# Patient Record
Sex: Female | Born: 1960
Health system: Southern US, Community
[De-identification: ages and names within clinical notes are randomized; demographics above are authoritative.]

## PROBLEM LIST (undated history)

## (undated) DIAGNOSIS — A6000 Herpesviral infection of urogenital system, unspecified: Secondary | ICD-10-CM

## (undated) DIAGNOSIS — F419 Anxiety disorder, unspecified: Secondary | ICD-10-CM

## (undated) DIAGNOSIS — K760 Fatty (change of) liver, not elsewhere classified: Secondary | ICD-10-CM

## (undated) DIAGNOSIS — Z789 Other specified health status: Secondary | ICD-10-CM

## (undated) DIAGNOSIS — E7800 Pure hypercholesterolemia, unspecified: Secondary | ICD-10-CM

## (undated) DIAGNOSIS — I10 Essential (primary) hypertension: Secondary | ICD-10-CM

## (undated) DIAGNOSIS — M609 Myositis, unspecified: Secondary | ICD-10-CM

## (undated) DIAGNOSIS — K859 Acute pancreatitis without necrosis or infection, unspecified: Secondary | ICD-10-CM

## (undated) DIAGNOSIS — F32A Depression, unspecified: Secondary | ICD-10-CM

## (undated) DIAGNOSIS — N939 Abnormal uterine and vaginal bleeding, unspecified: Secondary | ICD-10-CM

## (undated) DIAGNOSIS — D219 Benign neoplasm of connective and other soft tissue, unspecified: Secondary | ICD-10-CM

## (undated) DIAGNOSIS — I82409 Acute embolism and thrombosis of unspecified deep veins of unspecified lower extremity: Secondary | ICD-10-CM

## (undated) DIAGNOSIS — R7303 Prediabetes: Secondary | ICD-10-CM

## (undated) DIAGNOSIS — E1165 Type 2 diabetes mellitus with hyperglycemia: Secondary | ICD-10-CM

## (undated) DIAGNOSIS — B20 Human immunodeficiency virus [HIV] disease: Secondary | ICD-10-CM

## (undated) HISTORY — DX: Myositis, unspecified: M60.9

## (undated) HISTORY — DX: Pure hypercholesterolemia, unspecified: E78.00

## (undated) HISTORY — DX: Depression, unspecified: F32.A

## (undated) HISTORY — DX: Abnormal uterine and vaginal bleeding, unspecified: N93.9

## (undated) HISTORY — DX: Fatty (change of) liver, not elsewhere classified: K76.0

## (undated) HISTORY — DX: Anxiety disorder, unspecified: F41.9

## (undated) HISTORY — DX: Type 2 diabetes mellitus with hyperglycemia: E11.65

## (undated) HISTORY — DX: Benign neoplasm of connective and other soft tissue, unspecified: D21.9

## (undated) HISTORY — DX: Essential (primary) hypertension: I10

## (undated) HISTORY — DX: Acute pancreatitis without necrosis or infection, unspecified: K85.90

## (undated) HISTORY — DX: Acute embolism and thrombosis of unspecified deep veins of unspecified lower extremity: I82.409

## (undated) HISTORY — PX: IVC FILTER PLACEMENT (ARMC HX): HXRAD1551

## (undated) HISTORY — DX: Herpesviral infection of urogenital system, unspecified: A60.00

## (undated) HISTORY — PX: TONSILLECTOMY AND ADENOIDECTOMY: SUR1326

## (undated) HISTORY — DX: Human immunodeficiency virus (HIV) disease: B20

---

## 1992-11-17 DIAGNOSIS — B20 Human immunodeficiency virus [HIV] disease: Secondary | ICD-10-CM

## 1992-11-17 DIAGNOSIS — I10 Essential (primary) hypertension: Secondary | ICD-10-CM

## 1992-11-17 DIAGNOSIS — Z21 Asymptomatic human immunodeficiency virus [HIV] infection status: Secondary | ICD-10-CM

## 1992-11-17 HISTORY — DX: Essential (primary) hypertension: I10

## 1992-11-17 HISTORY — DX: Human immunodeficiency virus (HIV) disease: B20

## 1992-11-17 HISTORY — DX: Asymptomatic human immunodeficiency virus (hiv) infection status: Z21

## 2008-11-17 DIAGNOSIS — D219 Benign neoplasm of connective and other soft tissue, unspecified: Secondary | ICD-10-CM

## 2008-11-17 HISTORY — DX: Benign neoplasm of connective and other soft tissue, unspecified: D21.9

## 2012-10-18 ENCOUNTER — Other Ambulatory Visit: Payer: Self-pay

## 2012-10-18 DIAGNOSIS — E1169 Type 2 diabetes mellitus with other specified complication: Secondary | ICD-10-CM | POA: Insufficient documentation

## 2012-10-18 DIAGNOSIS — E785 Hyperlipidemia, unspecified: Secondary | ICD-10-CM | POA: Insufficient documentation

## 2012-10-18 DIAGNOSIS — R7303 Prediabetes: Secondary | ICD-10-CM | POA: Insufficient documentation

## 2012-10-18 DIAGNOSIS — I10 Essential (primary) hypertension: Secondary | ICD-10-CM | POA: Insufficient documentation

## 2012-10-18 DIAGNOSIS — B20 Human immunodeficiency virus [HIV] disease: Secondary | ICD-10-CM

## 2012-10-18 MED ORDER — EMTRICITAB-RILPIVIR-TENOFOV DF 200-25-300 MG PO TABS: 1.0000 | ORAL_TABLET | Freq: Every day | ORAL | Status: DC

## 2012-10-18 MED ORDER — RALTEGRAVIR POTASSIUM 400 MG PO TABS: 400.0000 mg | ORAL_TABLET | Freq: Two times a day (BID) | ORAL | Status: DC

## 2012-10-18 NOTE — Progress Notes (Signed)
Pt is in the process of transferring care from [facility].   She has been without her HIV medication for one month.  She was waiting for her primary care physician to make a referral and we have not received any notes.   Pt came on October 13, 2012 with copies of her notes and intake appointment was made.   She is not employed and needs assistance with medication.  We can obtain patient assistance for her Complera and Isentress for a 30 day supply.    Once information is obtained I will send medications to the pharmacy.     Laurell Josephs, RN

## 2012-10-21 ENCOUNTER — Ambulatory Visit (INDEPENDENT_AMBULATORY_CARE_PROVIDER_SITE_OTHER): Payer: Self-pay

## 2012-10-21 ENCOUNTER — Ambulatory Visit: Payer: Self-pay

## 2012-10-21 ENCOUNTER — Other Ambulatory Visit: Payer: Self-pay

## 2012-10-21 ENCOUNTER — Other Ambulatory Visit: Payer: Self-pay | Admitting: Internal Medicine

## 2012-10-21 DIAGNOSIS — L83 Acanthosis nigricans: Secondary | ICD-10-CM

## 2012-10-21 DIAGNOSIS — A630 Anogenital (venereal) warts: Secondary | ICD-10-CM

## 2012-10-21 DIAGNOSIS — I10 Essential (primary) hypertension: Secondary | ICD-10-CM

## 2012-10-21 DIAGNOSIS — Z111 Encounter for screening for respiratory tuberculosis: Secondary | ICD-10-CM

## 2012-10-21 DIAGNOSIS — Z79899 Other long term (current) drug therapy: Secondary | ICD-10-CM

## 2012-10-21 DIAGNOSIS — B20 Human immunodeficiency virus [HIV] disease: Secondary | ICD-10-CM

## 2012-10-21 DIAGNOSIS — D259 Leiomyoma of uterus, unspecified: Secondary | ICD-10-CM

## 2012-10-21 DIAGNOSIS — Z113 Encounter for screening for infections with a predominantly sexual mode of transmission: Secondary | ICD-10-CM

## 2012-10-21 MED ORDER — RALTEGRAVIR POTASSIUM 400 MG PO TABS: 400.0000 mg | ORAL_TABLET | Freq: Two times a day (BID) | ORAL | Status: DC

## 2012-10-21 MED ORDER — EMTRICITAB-RILPIVIR-TENOFOV DF 200-25-300 MG PO TABS: 1.0000 | ORAL_TABLET | Freq: Every day | ORAL | Status: DC

## 2012-10-22 ENCOUNTER — Telehealth: Payer: Self-pay | Admitting: *Deleted

## 2012-10-22 LAB — URINALYSIS
Bilirubin Urine: NEGATIVE
Glucose, UA: NEGATIVE mg/dL
Hgb urine dipstick: NEGATIVE
Ketones, ur: NEGATIVE mg/dL
Protein, ur: NEGATIVE mg/dL
Urobilinogen, UA: 0.2 mg/dL (ref 0.0–1.0)

## 2012-10-22 LAB — COMPLETE METABOLIC PANEL WITH GFR
Albumin: 4.5 g/dL (ref 3.5–5.2)
Alkaline Phosphatase: 68 U/L (ref 39–117)
BUN: 10 mg/dL (ref 6–23)
Creat: 0.72 mg/dL (ref 0.50–1.10)
GFR, Est Non African American: >89 mL/min
Glucose, Bld: 85 mg/dL (ref 70–99)
Potassium: 3.9 mEq/L (ref 3.5–5.3)
Total Bilirubin: 0.5 mg/dL (ref 0.3–1.2)

## 2012-10-22 LAB — CBC WITH DIFFERENTIAL/PLATELET
Basophils Relative: 0 % (ref 0–1)
Eosinophils Absolute: 0.1 10*3/uL (ref 0.0–0.7)
Eosinophils Relative: 1 % (ref 0–5)
HCT: 39.4 % (ref 36.0–46.0)
Hemoglobin: 13.3 g/dL (ref 12.0–15.0)
MCH: 27.3 pg (ref 26.0–34.0)
MCHC: 33.8 g/dL (ref 30.0–36.0)
MCV: 80.7 fL (ref 78.0–100.0)
Monocytes Absolute: 0.7 10*3/uL (ref 0.1–1.0)
Monocytes Relative: 8 % (ref 3–12)
RDW: 14.9 % (ref 11.5–15.5)

## 2012-10-22 LAB — LIPID PANEL
LDL Cholesterol: 98 mg/dL (ref 0–99)
Total CHOL/HDL Ratio: 4.5 Ratio
VLDL: 54 mg/dL — ABNORMAL HIGH (ref 0–40)

## 2012-10-22 LAB — RPR

## 2012-10-22 LAB — T-HELPER CELL (CD4) - (RCID CLINIC ONLY)
CD4 % Helper T Cell: 13 % — ABNORMAL LOW (ref 33–55)
CD4 T Cell Abs: 510 uL (ref 400–2700)

## 2012-10-22 LAB — HEPATITIS B SURFACE ANTIBODY,QUALITATIVE: Hep B S Ab: NONREACTIVE

## 2012-10-22 LAB — HEPATITIS B CORE ANTIBODY, TOTAL: Hep B Core Total Ab: NEGATIVE

## 2012-10-22 LAB — HEPATITIS B SURFACE ANTIGEN: Hepatitis B Surface Ag: NEGATIVE

## 2012-10-22 LAB — HEPATITIS C ANTIBODY: HCV Ab: NEGATIVE

## 2012-10-22 NOTE — Telephone Encounter (Signed)
[facility] lab called with an alert on this patient, platelet of [phone]. Gave Dr. Feliz Beam pager # and the fax copy of patient's lab. Wendall Mola CMA

## 2012-10-25 LAB — HIV-1 RNA ULTRAQUANT REFLEX TO GENTYP+
HIV 1 RNA Quant: 7710 copies/mL — ABNORMAL HIGH (ref <20)
HIV-1 RNA Quant, Log: 3.89 {Log} — ABNORMAL HIGH (ref <1.30)

## 2012-10-25 LAB — TB SKIN TEST: TB Skin Test: NEGATIVE

## 2012-10-27 DIAGNOSIS — L83 Acanthosis nigricans: Secondary | ICD-10-CM | POA: Insufficient documentation

## 2012-10-27 DIAGNOSIS — B20 Human immunodeficiency virus [HIV] disease: Secondary | ICD-10-CM | POA: Insufficient documentation

## 2012-10-27 DIAGNOSIS — D259 Leiomyoma of uterus, unspecified: Secondary | ICD-10-CM | POA: Insufficient documentation

## 2012-10-27 DIAGNOSIS — A630 Anogenital (venereal) warts: Secondary | ICD-10-CM | POA: Insufficient documentation

## 2012-10-27 NOTE — Progress Notes (Signed)
Patient recently lost her "dream job" due to cut backs  while living  [place] . 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    Records indicate  Viread caused rhabdo and ARVs were discontinued on 08-23-11.      Pt was started on Isentress and Complera 11-06-11  Laurell Josephs, RN

## 2012-11-04 ENCOUNTER — Ambulatory Visit (INDEPENDENT_AMBULATORY_CARE_PROVIDER_SITE_OTHER): Payer: Self-pay | Admitting: Internal Medicine

## 2012-11-04 ENCOUNTER — Encounter: Payer: Self-pay | Admitting: Internal Medicine

## 2012-11-04 VITALS — Ht 66.0 in | Wt 237.5 lb

## 2012-11-04 DIAGNOSIS — D693 Immune thrombocytopenic purpura: Secondary | ICD-10-CM

## 2012-11-04 DIAGNOSIS — B20 Human immunodeficiency virus [HIV] disease: Secondary | ICD-10-CM

## 2012-11-04 DIAGNOSIS — Z23 Encounter for immunization: Secondary | ICD-10-CM

## 2012-11-04 LAB — CBC WITH DIFFERENTIAL/PLATELET
Basophils Absolute: 0 10*3/uL (ref 0.0–0.1)
Basophils Relative: 0 % (ref 0–1)
Eosinophils Relative: 2 % (ref 0–5)
HCT: 38 % (ref 36.0–46.0)
Lymphocytes Relative: 41 % (ref 12–46)
MCHC: 34.5 g/dL (ref 30.0–36.0)
MCV: 77.6 fL — ABNORMAL LOW (ref 78.0–100.0)
Monocytes Absolute: 0.7 10*3/uL (ref 0.1–1.0)
Monocytes Relative: 7 % (ref 3–12)
RDW: 14.8 % (ref 11.5–15.5)

## 2012-11-04 NOTE — Progress Notes (Signed)
[facility] HIV CLINIC NOTE  RVF: to establish care Subjective:    Patient ID: Wanda Kane, female    DOB: 17-Jan-1961, 51 y.o.   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 She ran out of her meds in mid October but then restarted beginning December.   Nov 2009- aug 2013: in [place], un employed for 2.5 yrs.followed at [facility]. Prior to that was at [facility]  Restarted HIV meds in 2010 Off of meds from 2006-2010  Now back to [place] in hopes to be close to family and finding social work position.   - previous ART hx: Truvada/darunavir/ritonavir  - viread : gave her  rhabdomyolysis  Current Outpatient Prescriptions on File Prior to Visit  Medication Sig Dispense Refill  . amLODipine (NORVASC) 10 MG tablet Take 10 mg by mouth daily.      . Emtricitab-Rilpivir-Tenofovir (COMPLERA) 200-25-300 MG TABS Take 1 tablet by mouth daily with lunch.  30 tablet  0  . lisinopril (PRINIVIL,ZESTRIL) 10 MG tablet Take 10 mg by mouth daily.      . raltegravir (ISENTRESS) 400 MG tablet Take 1 tablet (400 mg total) by mouth 2 (two) times daily.  60 tablet  0  . spironolactone (ALDACTONE) 100 MG tablet Take 100 mg by mouth daily.       Active Ambulatory Problems    Diagnosis Date Noted  . HIV (human immunodeficiency virus infection) 10/18/2012  . HTN (hypertension) 10/18/2012  . Dyslipidemia 10/18/2012  . Pre-diabetes 10/18/2012  . Condyloma acuminatum 10/27/2012  . Acquired acanthosis nigricans 10/27/2012  . Uterine fibroid 10/27/2012  . 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 - 20 -25# weight  gain - depression    Objective:   Physical Exam Ht 5\' 6"  (1.676 m)  Wt 237 lb 8 oz (107.729 kg)  BMI 38.33 kg/m2  LMP 10/05/2012 Physical Exam  Constitutional:  oriented to person, place, and time.  appears well-developed and well-nourished. No distress.  HENT:  Mouth/Throat: Oropharynx is clear and moist. No oropharyngeal exudate.  Cardiovascular: Normal rate, regular rhythm and normal heart sounds. Exam reveals no gallop and no friction rub.  No murmur heard.  Pulmonary/Chest: Effort normal and breath sounds normal. No respiratory distress.  no wheezes.  Abdominal: Soft. Bowel sounds are normal.  exhibits no distension. There is no tenderness.  Lymphadenopathy:  no cervical adenopathy.  Skin: Skin is warm and dry. No rash noted. No erythema.  Psychiatric: normal mood and affect. His behavior is normal.       Assessment & Plan:  ITP = will recheck CBC  HIV = refill complera and raltegravir  Health maintenance = received flu shot  Depression = would like to see [facility]

## 2012-11-06 ENCOUNTER — Telehealth: Payer: Self-pay | Admitting: Internal Medicine

## 2012-11-06 NOTE — Telephone Encounter (Signed)
Ms Kerrick called complaining of "muscle spasm" (twitching not cramps) in her arms and legs for the past few weeks. She is concerned that it might be due to Isentress. She had similar episodes last year while on a Prezista based regimen. She recalls her CK being around 1200. Her ID doc in [place] told her it was caused by Prezista. I suggested she call the clinic on Monday and let Dr. Drue Second know.

## 2012-11-08 ENCOUNTER — Other Ambulatory Visit: Payer: Self-pay | Admitting: Licensed Clinical Social Worker

## 2012-11-08 DIAGNOSIS — B20 Human immunodeficiency virus [HIV] disease: Secondary | ICD-10-CM

## 2012-11-08 MED ORDER — EMTRICITAB-RILPIVIR-TENOFOV DF 200-25-300 MG PO TABS: 1.0000 | ORAL_TABLET | Freq: Every day | ORAL | Status: DC

## 2012-11-25 ENCOUNTER — Ambulatory Visit: Payer: Self-pay

## 2012-11-25 DIAGNOSIS — F4323 Adjustment disorder with mixed anxiety and depressed mood: Secondary | ICD-10-CM

## 2012-11-25 NOTE — Progress Notes (Unsigned)
I met with Wanda Kane today for the first time and she talked about her depressed mood and anxiety from being out of work.  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  She says she has financial and emotional support from her parents and that this is very helpful.  She reports good sleep, some crying spells, but no SI.  She rated her mood at 6-7 (with 10 being good) most days and her anxiety at 2 on a 10 pt scale.  She is currently staying at an Extended Stay hotel and is trying to stay positive and busy.  I provided some psycho-education on breathing techniques to reduce anxiety and discussed ways to focus on goals instead of becoming discouraged and negative with her thinking.  She took my contact information and agreed to call if she feels she needs to return, saying she felt more motivated after the session.

## 2012-12-02 ENCOUNTER — Ambulatory Visit: Payer: Self-pay | Admitting: Internal Medicine

## 2012-12-02 ENCOUNTER — Ambulatory Visit: Payer: Self-pay

## 2012-12-15 ENCOUNTER — Ambulatory Visit: Payer: Self-pay

## 2012-12-15 ENCOUNTER — Ambulatory Visit: Payer: Self-pay | Admitting: Internal Medicine

## 2012-12-28 ENCOUNTER — Ambulatory Visit (INDEPENDENT_AMBULATORY_CARE_PROVIDER_SITE_OTHER): Payer: Self-pay | Admitting: Internal Medicine

## 2012-12-28 ENCOUNTER — Encounter: Payer: Self-pay | Admitting: Internal Medicine

## 2012-12-28 ENCOUNTER — Ambulatory Visit: Payer: Self-pay

## 2012-12-28 VITALS — BP 129/77 | HR 107 | Temp 98.6°F | Ht 66.0 in | Wt 237.0 lb

## 2012-12-28 DIAGNOSIS — Z23 Encounter for immunization: Secondary | ICD-10-CM

## 2012-12-28 DIAGNOSIS — B20 Human immunodeficiency virus [HIV] disease: Secondary | ICD-10-CM

## 2012-12-28 LAB — CBC WITH DIFFERENTIAL/PLATELET
Basophils Relative: 1 % (ref 0–1)
Eosinophils Absolute: 0.1 10*3/uL (ref 0.0–0.7)
Eosinophils Relative: 2 % (ref 0–5)
Hemoglobin: 12 g/dL (ref 12.0–15.0)
MCH: 26.7 pg (ref 26.0–34.0)
MCHC: 34 g/dL (ref 30.0–36.0)
MCV: 78.4 fL (ref 78.0–100.0)
Monocytes Absolute: 0.3 10*3/uL (ref 0.1–1.0)
Monocytes Relative: 5 % (ref 3–12)
Neutrophils Relative %: 49 % (ref 43–77)

## 2012-12-28 MED ORDER — ELVITEG-COBIC-EMTRICIT-TENOFDF 150-150-200-300 MG PO TABS: 1.0000 | ORAL_TABLET | Freq: Every day | ORAL | Status: DC

## 2012-12-28 NOTE — Progress Notes (Signed)
[facility] HIV CLINIC NOTE  RFV:  Routine follow up Subjective:    Patient ID: Wanda Kane, female    DOB: Apr 08, 1961, 52 y.o.   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 She ran out of her meds in mid October but then restarted beginning December. She states that she has been taking complera/raltegravir without much difficulty, not missing a dose.   She has noticed some weight gain. 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 Has been self-conscious about not having a job/ socializing.   Past ART/Soc Hx: Nov 2009- aug 2013: in [place], un employed for 2.5 yrs.followed at [facility]. Prior to that was at [facility]  Restarted HIV meds in 2010  Off of meds from 2006-2010 ; previously onTruvada/darunavir/ritonavir  - viread : gave her rhabdomyolysis? Muscle spasm.    Current Outpatient Prescriptions on File Prior to Visit  Medication Sig Dispense Refill  . amLODipine (NORVASC) 10 MG tablet Take 10 mg by mouth daily.      . Emtricitab-Rilpivir-Tenofovir (COMPLERA) 200-25-300 MG TABS Take 1 tablet by mouth daily with lunch.  30 tablet  6  . lisinopril (PRINIVIL,ZESTRIL) 10 MG tablet Take 10 mg by mouth daily.      . raltegravir (ISENTRESS) 400 MG tablet Take 1 tablet (400 mg total) by mouth 2 (two) times daily.  60 tablet  0  . spironolactone (ALDACTONE) 100 MG tablet Take 100 mg by mouth daily.       No current facility-administered medications on file prior to visit.     Review of Systems     Objective:   Physical Exam BP 129/77  Pulse 107  Temp(Src) 98.6 F (37 C) (Oral)  Ht 5\' 6"  (1.676 m)  Wt 237 lb (107.502 kg)  BMI 38.27 kg/m2  LMP 12/21/2012 Physical Exam  Constitutional:oriented to person, place, and time. appears well-developed and well-nourished. No distress.  HENT:  Mouth/Throat:  Oropharynx is clear and moist. No oropharyngeal exudate.  Cardiovascular: Normal rate, regular rhythm and normal heart sounds. Exam reveals no gallop and no friction rub.  No murmur heard.  Pulmonary/Chest: Effort normal and breath sounds normal. No respiratory distress.  no wheezes.  Abdominal: Soft. Bowel sounds are normal. exhibits no distension. There is no tenderness.  Lymphadenopathy:  no cervical adenopathy.  Skin: Skin is warm and dry. No rash noted. No erythema.       Assessment & Plan:  HIV =  Will recheck CD 4 count and VL today to see that she has viral suppression since starting back on ART in early Dec. Will start stribild as single tablet regimen since genotype from Dec 2013 has no concerning mutations at next refill  Hx of ITP = will check cbc  HTN = within goal on current regimen  Hypertriglyceridemia = started taking fish oil in December  Adjustment disorder/social phobia= went to [facility] had a good meeting. Will schedule monthly meeting?  Health maintenance = will start hep B series, since it appears she is not immune. Give hep B #1 today; she will need pap and mammo in Fall 2014.  rtc in 4-5 wks. When we change out to stribild

## 2012-12-29 LAB — COMPREHENSIVE METABOLIC PANEL
AST: 26 U/L (ref 0–37)
Albumin: 4.3 g/dL (ref 3.5–5.2)
Alkaline Phosphatase: 84 U/L (ref 39–117)
BUN: 12 mg/dL (ref 6–23)
Calcium: 9.8 mg/dL (ref 8.4–10.5)
Chloride: 104 mEq/L (ref 96–112)
Potassium: 4.6 mEq/L (ref 3.5–5.3)
Sodium: 137 mEq/L (ref 135–145)
Total Protein: 8 g/dL (ref 6.0–8.3)

## 2013-01-03 ENCOUNTER — Encounter: Payer: Self-pay | Admitting: Internal Medicine

## 2013-01-03 NOTE — Progress Notes (Signed)
HPI: Wanda Kane is a 52 y.o. female with HIV who is here for her follow up visit.   Allergies: Allergies  Allergen Reactions  . Viread (Tenofovir)     Rhabdo    Vitals:    Past Medical History: No past medical history on file.  Social History: History   Social History  . Marital Status: Married    Spouse Name: N/A    Number of Children: N/A  . Years of Education: N/A   Social History Main Topics  . Smoking status: Never Smoker   . Smokeless tobacco: Never Used  . Alcohol Use: No  . Drug Use: No  . Sexually Active: Not Currently -- Female partner(s)    Birth Control/ Protection: Condom     Comment: patient declined condoms   Other Topics Concern  . None   Social History Narrative  . None    Home Medications:  (Not in a hospital admission)  Current Regimen: Complera + RAL  Labs: HIV 1 RNA Quant (copies/mL)  Date Value  12/28/2012 <20   10/21/2012 7710*     CD4 T Cell Abs (cmm)  Date Value  12/28/2012 460   10/21/2012 510      Hep B S Ab (no units)  Date Value  10/21/2012 NONREACTIVE      Hepatitis B Surface Ag (no units)  Date Value  10/21/2012 NEGATIVE      HCV Ab (no units)  Date Value  10/21/2012 NEGATIVE     CrCl: Estimated Creatinine Clearance: 90.7 ml/min (by C-G formula based on Cr of 0.9).  Lipids:    Component Value Date/Time   CHOL 196 10/21/2012 1515   TRIG 269* 10/21/2012 1515   HDL 44 10/21/2012 1515   CHOLHDL 4.5 10/21/2012 1515   VLDL 54* 10/21/2012 1515   LDLCALC 98 10/21/2012 1515    Assessment: She is here for her routine follow up. Her last VL was elevated because she ran out of meds and then restarted meds again. She knows exactly what she takes and how to take them. I offer her the option to change her regimen to one tablet a day to make things easier. She said that she would consider it at the next visit.  Recommendations: Cont Complera and Isentress  Clide Cliff, PharmD Clinical Infectious Disease  Pharmacist [facility] 01/03/2013, 11:08 PM

## 2013-01-06 ENCOUNTER — Ambulatory Visit: Payer: Self-pay

## 2013-01-06 DIAGNOSIS — F4320 Adjustment disorder, unspecified: Secondary | ICD-10-CM

## 2013-01-06 NOTE — Progress Notes (Signed)
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  She stated that her plan is to begin working on her master's degree at [employer] and eventually work in the Animal nutritionist.  She also talked about diet and said she is currently trying the "paleo diet".  She is also trying to stop eating dairy products.  Overall, she seems to be doing well and her mood seems upbeat.  Plan to meet again in 2 weeks.

## 2013-01-20 ENCOUNTER — Ambulatory Visit: Payer: Self-pay

## 2013-01-25 ENCOUNTER — Encounter: Payer: Self-pay | Admitting: Internal Medicine

## 2013-01-25 ENCOUNTER — Ambulatory Visit: Payer: Self-pay

## 2013-01-25 ENCOUNTER — Ambulatory Visit (INDEPENDENT_AMBULATORY_CARE_PROVIDER_SITE_OTHER): Payer: Self-pay | Admitting: Internal Medicine

## 2013-01-25 VITALS — BP 108/73 | HR 116 | Temp 98.0°F | Ht 65.0 in | Wt 241.0 lb

## 2013-01-25 DIAGNOSIS — B20 Human immunodeficiency virus [HIV] disease: Secondary | ICD-10-CM

## 2013-01-25 DIAGNOSIS — F4320 Adjustment disorder, unspecified: Secondary | ICD-10-CM

## 2013-01-25 DIAGNOSIS — Z23 Encounter for immunization: Secondary | ICD-10-CM

## 2013-01-25 NOTE — Progress Notes (Signed)
I met with Wanda Kane again today and she talked about her recent issues with dieting, etc.  She reported that she noticed that her crying spells have disappeared since her medication change a week ago to Stribild.  I listened empathically and provided support as she talked about various stressors and issues.  She continues to struggle with unemployment, but is hopeful about a job in [place], [place]).  Plan to meet again in 2 weeks.

## 2013-01-25 NOTE — Progress Notes (Signed)
[facility] CLINIC NOTE  RFV: routine follow up Subjective:    Patient ID: Wanda Kane, female    DOB: 1961-08-01, 52 y.o.   MRN: [id number]  HPI HIV with ITP, dx in 1994, CD 4 count 460/VL<20,Doing well with stribild. Enjoying being on a single tablet regimen. 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 Did interview last month but has not heard about anything yet.  Some nausea at 4pm for unclear reasons. Decreased appetite at lunch, but otherwise she is doing ok. She is starting to meet with Bernette Redbird to help with processing her thoughts. She is feeling like she doesn't care to meet new people, self-conscious about being unemployed  Current Outpatient Prescriptions on File Prior to Visit  Medication Sig Dispense Refill  . amLODipine (NORVASC) 10 MG tablet Take 10 mg by mouth daily.      Marland Kitchen elvitegravir-cobicistat-emtricitabine-tenofovir (STRIBILD) 150-150-200-300 MG TABS Take 1 tablet by mouth daily with breakfast.  30 tablet  11  . lisinopril (PRINIVIL,ZESTRIL) 10 MG tablet Take 10 mg by mouth daily.      Marland Kitchen spironolactone (ALDACTONE) 100 MG tablet Take 100 mg by mouth daily.       No current facility-administered medications on file prior to visit.   Active Ambulatory Problems    Diagnosis Date Noted  . HIV (human immunodeficiency virus infection) 10/18/2012  . HTN (hypertension) 10/18/2012  . Dyslipidemia 10/18/2012  . Pre-diabetes 10/18/2012  . Condyloma acuminatum 10/27/2012  . Acquired acanthosis nigricans 10/27/2012  . Uterine fibroid 10/27/2012  . 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 No Resolved Ambulatory Problems   No Additional Past Medical History       Review of Systems     Objective:   Physical Exam  BP 108/73  Pulse 116  Temp(Src) 98 F (36.7 C) (Oral)  Ht 5\' 5"  (1.651 m)  Wt 241 lb (109.317 kg)  BMI 40.1 kg/m2  LMP 12/21/2012 Physical Exam  Constitutional:  oriented to person,  place, and time.  appears well-developed and well-nourished. No distress.  HENT:  Mouth/Throat: Oropharynx is clear and moist. No oropharyngeal exudate.  Cardiovascular: Normal rate, regular rhythm and normal heart sounds. Exam reveals no gallop and no friction rub.  No murmur heard.  Pulmonary/Chest: Effort normal and breath sounds normal. No respiratory distress.  no wheezes.  Abdominal: Soft. Bowel sounds are normal.exhibits no distension. There is no tenderness.  Lymphadenopathy:  no cervical adenopathy.  Skin: Skin is warm and dry. No rash noted. No erythema.  Psychiatric: at her baseline, upbeat, not withdrawn        Assessment & Plan:  hiv = continue with stribild  Health maintenance = will give hep B #2 vaccine today. Up todate of female exam  Situation depression vs. Depression = meeting with Bernette Redbird to discuss mindset on how to deal with unemployment  Nausea = will "work through it"; no need for meds at this point  rtc in 3months

## 2013-02-08 ENCOUNTER — Ambulatory Visit: Payer: Self-pay

## 2013-02-09 ENCOUNTER — Encounter: Payer: Self-pay | Admitting: Infectious Disease

## 2013-02-09 ENCOUNTER — Ambulatory Visit (INDEPENDENT_AMBULATORY_CARE_PROVIDER_SITE_OTHER): Payer: Self-pay | Admitting: Infectious Disease

## 2013-02-09 VITALS — BP 122/75 | HR 105 | Temp 97.8°F | Ht 65.0 in | Wt 234.0 lb

## 2013-02-09 DIAGNOSIS — IMO0001 Reserved for inherently not codable concepts without codable children: Secondary | ICD-10-CM

## 2013-02-09 DIAGNOSIS — B20 Human immunodeficiency virus [HIV] disease: Secondary | ICD-10-CM

## 2013-02-09 DIAGNOSIS — M791 Myalgia, unspecified site: Secondary | ICD-10-CM | POA: Insufficient documentation

## 2013-02-09 DIAGNOSIS — Z21 Asymptomatic human immunodeficiency virus [HIV] infection status: Secondary | ICD-10-CM

## 2013-02-09 LAB — CBC WITH DIFFERENTIAL/PLATELET
Basophils Relative: 0 % (ref 0–1)
HCT: 35.7 % — ABNORMAL LOW (ref 36.0–46.0)
Hemoglobin: 12 g/dL (ref 12.0–15.0)
Lymphocytes Relative: 30 % (ref 12–46)
Lymphs Abs: 2.9 10*3/uL (ref 0.7–4.0)
Monocytes Absolute: 0.6 10*3/uL (ref 0.1–1.0)
Monocytes Relative: 6 % (ref 3–12)
Neutro Abs: 6.2 10*3/uL (ref 1.7–7.7)
Neutrophils Relative %: 64 % (ref 43–77)
RBC: 4.62 MIL/uL (ref 3.87–5.11)
WBC: 9.8 10*3/uL (ref 4.0–10.5)

## 2013-02-09 LAB — COMPLETE METABOLIC PANEL WITH GFR
Albumin: 4.7 g/dL (ref 3.5–5.2)
Alkaline Phosphatase: 81 U/L (ref 39–117)
CO2: 24 mEq/L (ref 19–32)
Calcium: 10 mg/dL (ref 8.4–10.5)
Chloride: 98 mEq/L (ref 96–112)
GFR, Est Non African American: 61 mL/min
Glucose, Bld: 114 mg/dL — ABNORMAL HIGH (ref 70–99)
Potassium: 4.5 mEq/L (ref 3.5–5.3)
Sodium: 132 mEq/L — ABNORMAL LOW (ref 135–145)
Total Protein: 8.5 g/dL — ABNORMAL HIGH (ref 6.0–8.3)

## 2013-02-09 LAB — CK: Total CK: 237 U/L — ABNORMAL HIGH (ref 7–177)

## 2013-02-09 MED ORDER — LAMIVUDINE 300 MG PO TABS: 300.0000 mg | ORAL_TABLET | Freq: Every day | ORAL | Status: DC

## 2013-02-09 MED ORDER — DOLUTEGRAVIR SODIUM 50 MG PO TABS: 50.0000 mg | ORAL_TABLET | Freq: Every day | ORAL | Status: DC

## 2013-02-09 MED ORDER — RILPIVIRINE HCL 25 MG PO TABS: 25.0000 mg | ORAL_TABLET | Freq: Every day | ORAL | Status: DC

## 2013-02-09 NOTE — Progress Notes (Signed)
Subjective:    Patient ID: Wanda Kane, female    DOB: Jul 21, 1961, 52 y.o.   MRN: [id number]  HPI  Wanda Kane is a 52 year old African American lady with HIV previous he cared for Wanda Kane and then moved to [place]. While in [place] she apparently was on various antiretroviral regimens lastly on Isentress and complera. She moved back in July was seen by Dr. Drue Kane in February. Her regimen was simplified to STRIBILD. She had tolerated this regimen without to much complaint other some nausea and loose stools. However in the last week she has developed increasingly severe diffuse myalgias in her neck arms legs. She had similar symptoms in the past while out on antiretrovirals. She could not initially recall which antiretrovirals at apparently been blamed for the symptoms. However when I examined her allergy history he or she is listed as being allergic to tenofovir with alleged tenofovir-induced rhabdomyolysis.  I am therefore crafting a new ARV regimen for her without TNF, namely once daily Tivicay, Edurant and Epivir. I will also check her for elevated CPK and other muscle enzymes.  I spent greater than 45 minutes with the patient including greater than 50% of time in face to face counsel of the patient and in coordination of their care.   Review of Systems  Constitutional: Negative for fever, chills, diaphoresis, activity change, appetite change, fatigue and unexpected weight change.  HENT: Positive for neck stiffness. Negative for congestion, sore throat, rhinorrhea, sneezing, trouble swallowing and sinus pressure.   Eyes: Negative for photophobia and visual disturbance.  Respiratory: Negative for cough, chest tightness, shortness of breath, wheezing and stridor.   Cardiovascular: Negative for chest pain, palpitations and leg swelling.  Gastrointestinal: Positive for nausea and diarrhea. Negative for vomiting, abdominal pain, constipation, blood in stool, abdominal distention and  anal bleeding.  Genitourinary: Negative for dysuria, hematuria, flank pain and difficulty urinating.  Musculoskeletal: Positive for myalgias and arthralgias. Negative for back pain, joint swelling and gait problem.  Skin: Negative for color change, pallor, rash and wound.  Neurological: Negative for dizziness, tremors, weakness and light-headedness.  Hematological: Negative for adenopathy. Does not bruise/bleed easily.  Psychiatric/Behavioral: Negative for behavioral problems, confusion, sleep disturbance, dysphoric mood, decreased concentration and agitation.       Objective:   Physical Exam  Constitutional: She is oriented to person, place, and time. She appears well-developed and well-nourished. No distress.  HENT:  Head: Normocephalic and atraumatic.  Mouth/Throat: Oropharynx is clear and moist. No oropharyngeal exudate.  Eyes: Conjunctivae and EOM are normal. Pupils are equal, round, and reactive to light. No scleral icterus.  Neck: Normal range of motion. Neck supple. No JVD present.  Cardiovascular: Normal rate, regular rhythm and normal heart sounds.  Exam reveals no gallop and no friction rub.   No murmur heard. Pulmonary/Chest: Effort normal and breath sounds normal. No respiratory distress. She has no wheezes. She has no rales. She exhibits no tenderness.  Abdominal: She exhibits no distension and no mass. There is no tenderness. There is no rebound and no guarding.  Musculoskeletal: She exhibits no edema and no tenderness.  Lymphadenopathy:    She has no cervical adenopathy.  Neurological: She is alert and oriented to person, place, and time. She exhibits normal muscle tone. Coordination normal.  Skin: Skin is warm and dry. She is not diaphoretic. No erythema. No pallor.  Psychiatric: She has a normal mood and affect. Her behavior is normal. Judgment and thought content normal.  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  HIV: See above discussion: A like to obtain records from [facility] as well as from [place]. Her prior regimen of complera and isentress would suggest presence of at least some resistance but perhaps mutations were minor. I feel will have her on the above regimen for now. We'll recheck her labs in one month's time as well.

## 2013-02-09 NOTE — Patient Instructions (Addendum)
Your new regimen is:  Tivicay 50mg  once daily  With  Epivir 300mg  once daily  And  Edurant 25 mg once daily  ALL with 500 calorie meal  Avoid all proton pump inhbitors and avoid antacids

## 2013-02-10 ENCOUNTER — Telehealth: Payer: Self-pay | Admitting: *Deleted

## 2013-02-10 LAB — URINALYSIS, ROUTINE W REFLEX MICROSCOPIC
Bilirubin Urine: NEGATIVE
Nitrite: NEGATIVE
Protein, ur: NEGATIVE mg/dL
Urobilinogen, UA: 0.2 mg/dL (ref 0.0–1.0)

## 2013-02-10 LAB — T-HELPER CELL (CD4) - (RCID CLINIC ONLY): CD4 T Cell Abs: 490 uL (ref 400–2700)

## 2013-02-10 NOTE — Telephone Encounter (Signed)
Patient notified

## 2013-02-10 NOTE — Telephone Encounter (Signed)
Dr. Daiva Eves, please see patient's email. She wanted you to be aware of medications she has taken previously. Wanda Kane

## 2013-02-10 NOTE — Telephone Encounter (Signed)
For some reason I dont know where to find it. I will ask Tamika to show me or if you have a sec show me during clnic. Thanks!

## 2013-02-10 NOTE — Telephone Encounter (Signed)
Dr. Drue Second, please see patient's email, regarding previous HIV regimens. Wanda Kane

## 2013-02-10 NOTE — Telephone Encounter (Signed)
THis is helpful.  Her CPK is up again but just barely  I actually think that it is HIGHLY UNLIKELY THAT her Musculoskeletal ssx and pathology are related to her ARV meds.  TNF which was apparently blamed before was in the prezista, norvir truvada regimen but ALSO in the regimens with complera and StRibild.  She is NOW OFF TNF so on odd chance that she really had TNF induced myositis it should NOT now occur.   If it does occur now this will prove that it is completely unrelated to her ARVS and shemay need Rheumatology workup

## 2013-02-11 LAB — ALDOLASE: Aldolase: 5.2 U/L (ref <=8.1)

## 2013-02-13 LAB — HIV-1 RNA ULTRAQUANT REFLEX TO GENTYP+: HIV-1 RNA Quant, Log: <1.3 {Log} (ref <1.30)

## 2013-02-16 LAB — HLA B*5701: HLA-B*5701: NEGATIVE

## 2013-02-28 ENCOUNTER — Ambulatory Visit: Payer: Self-pay

## 2013-02-28 DIAGNOSIS — F4320 Adjustment disorder, unspecified: Secondary | ICD-10-CM

## 2013-02-28 NOTE — Progress Notes (Signed)
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  She feels that this hurts her chances of finding another job.  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  I challenged this and pointed out that she is currently undetectable.  She agreed to write down some of her dreams and goals and said "you've got me thinking".  Plan to meet in 2 weeks.

## 2013-03-02 ENCOUNTER — Telehealth: Payer: Self-pay | Admitting: *Deleted

## 2013-03-02 ENCOUNTER — Encounter: Payer: Self-pay | Admitting: Internal Medicine

## 2013-03-02 NOTE — Telephone Encounter (Signed)
Patient sent an email c/o neuropathy "all over her body" and said that Tylenol doe not help. She said she is tolerating her HIV meds fine. Please advise, she has an upcoming appointment in May. Wendall Mola

## 2013-03-03 NOTE — Telephone Encounter (Signed)
Please respond to [place] or [place] as I will be on vacation all next week.

## 2013-03-15 ENCOUNTER — Other Ambulatory Visit: Payer: Self-pay | Admitting: Internal Medicine

## 2013-03-15 ENCOUNTER — Ambulatory Visit: Payer: Self-pay

## 2013-03-15 ENCOUNTER — Ambulatory Visit (INDEPENDENT_AMBULATORY_CARE_PROVIDER_SITE_OTHER): Payer: Self-pay | Admitting: Internal Medicine

## 2013-03-15 VITALS — BP 124/77 | HR 108 | Temp 98.7°F | Wt 233.0 lb

## 2013-03-15 DIAGNOSIS — B20 Human immunodeficiency virus [HIV] disease: Secondary | ICD-10-CM

## 2013-03-15 DIAGNOSIS — R748 Abnormal levels of other serum enzymes: Secondary | ICD-10-CM

## 2013-03-15 LAB — BASIC METABOLIC PANEL
BUN: 8 mg/dL (ref 6–23)
Calcium: 9.4 mg/dL (ref 8.4–10.5)
Creat: 0.92 mg/dL (ref 0.50–1.10)

## 2013-03-15 LAB — HEPATIC FUNCTION PANEL
Bilirubin, Direct: 0.1 mg/dL (ref 0.0–0.3)
Total Bilirubin: 0.4 mg/dL (ref 0.3–1.2)

## 2013-03-15 NOTE — Progress Notes (Signed)
[facility] NOTE  RFV: routine follow up from recent ART regimen change  Subjective:    Patient ID: Wanda Kane, female    DOB: 09/27/1961, 52 y.o.   MRN: [id number]  HPI  Wanda Kane is a 52yo F with HIV with ITP, dx in 1994, CD 4 count 490/VL<20,on stribild from her prior regimen of raltegravir and complera. She was doing well until mid-late march when she had diffuse myalgias to upper extremities where she Felt like she had upperbody work out even though she not had exercised. Feel this improved after discontinuation of stribild and switched to  She was seen by Dr. Daiva Eves who switched her to tivicay, rilpivirine and lamivudine. (tenofovir free regimen). The etiology of her elevated CK is unclear if due to tenofovir vs. An auto-immune myositis. Her CK level at that visit was on 250s.  Most recently,she has noticed needle like sensation, pin prick allover her body on tongue, head, not only located just hands and feet. No burning sensation. This does not persist throughout the day. It lasts roughly 1 hr per day at a maximum, with intensity of 5-6 out of a 10 point scale. No weakness associated with it. Unable to reproduce it or doesn't know what triggers it.  Also noticed having back stiffness. occ calf pain and toes cramping.  Current Outpatient Prescriptions on File Prior to Visit  Medication Sig Dispense Refill  . amLODipine (NORVASC) 10 MG tablet Take 10 mg by mouth daily.      Roselie Awkward Sodium (TIVICAY) 50 MG TABS Take 1 tablet (50 mg total) by mouth daily.  30 tablet  11  . lamivudine (EPIVIR) 300 MG tablet Take 1 tablet (300 mg total) by mouth daily.  30 tablet  11  . lisinopril (PRINIVIL,ZESTRIL) 10 MG tablet Take 10 mg by mouth daily.      . rilpivirine (EDURANT) 25 MG TABS tablet Take 1 tablet (25 mg total) by mouth daily with breakfast.  30 tablet  11  . spironolactone (ALDACTONE) 100 MG tablet Take 100 mg by mouth daily.       No current facility-administered medications on file  prior to visit.   Active Ambulatory Problems    Diagnosis Date Noted  . HIV (human immunodeficiency virus infection) 10/18/2012  . HTN (hypertension) 10/18/2012  . Dyslipidemia 10/18/2012  . Pre-diabetes 10/18/2012  . Condyloma acuminatum 10/27/2012  . Acquired acanthosis nigricans 10/27/2012  . Uterine fibroid 10/27/2012  . 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 Myalgia and myositis 02/09/2013   Resolved Ambulatory Problems    Diagnosis Date Noted  . 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   family hx is unchanged  Review of Systems  Constitutional: Negative for fever, chills, diaphoresis, activity change, appetite change, fatigue and unexpected weight change.  HENT: Negative for congestion, sore throat, rhinorrhea, sneezing, trouble swallowing and sinus pressure.  Eyes: Negative for photophobia and visual disturbance.  Respiratory: Negative for cough, chest tightness, shortness of breath, wheezing and stridor.  Cardiovascular: Negative for chest pain, palpitations and leg swelling.  Gastrointestinal: Negative for nausea, vomiting, abdominal pain, diarrhea, constipation, blood in stool, abdominal distention and anal bleeding.  Genitourinary: Negative for dysuria, hematuria, flank pain and difficulty urinating.  Musculoskeletal: Negative for myalgias, back pain, joint swelling, arthralgias and gait problem.  Skin: Negative for color change, pallor, rash and wound.  Neurological: see hpi Hematological: Negative for adenopathy.  Does not bruise/bleed easily.  Psychiatric/Behavioral: Negative for behavioral problems, confusion, sleep disturbance, dysphoric mood, decreased concentration and agitation.      Objective:   Physical Exam BP 124/77  Pulse 108  Temp(Src) 98.7 F (37.1 C) (Oral)  Wt 233 lb (105.688 kg)  BMI 38.77 kg/m2 Physical Exam  Constitutional:  oriented to  person, place, and time.  appears well-developed and well-nourished. No distress.  HENT:  Mouth/Throat: Oropharynx is clear and moist. No oropharyngeal exudate.  Cardiovascular: Normal rate, regular rhythm and normal heart sounds. Exam reveals no gallop and no friction rub.  No murmur heard.  Pulmonary/Chest: Effort normal and breath sounds normal. No respiratory distress.  no wheezes.  Abdominal: Soft. Bowel sounds are normal. He exhibits no distension. There is no tenderness.  Lymphadenopathy:   no cervical adenopathy.  Neurological: CN2-12 intact, motor and sensation intact Skin: Skin is warm and dry. No rash noted. No erythema.      Assessment & Plan:  hiv = will check cd 4 count and viral load to ensure that she has still good viral suppression with change in new regimen   Elevated CK = will check to ensure back to baseline; although regardless of its value presently she is asymptomatic  "Needle like" neuropathic pain = these symptoms are somewhat non-focal and do not persist. she is unable to mention what triggers them and exacerbates the pain sensation. I am not convinced this is a side effect of medication or that they are associated with her intermittent elevated CK. If they cause more problems for her, may need to refer to neurology for work up.  Pre-diabetes =will check bmp to see random glucose  rtc in 4-6 wks.

## 2013-03-15 NOTE — Progress Notes (Signed)
Daya continues to struggle with decisions about her career.  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  She also is going to a meeting in [place], [place] about applying for the MSW program at [employer].  She also continues to explore getting a nursing degree, which sounds like what she really wants to do.  In the midst of all this, this morning she was told about a job interview at [employer].  I told her to go ahead to the interview whether it's what she wants or not.  She says that it helps to come in and sort out all her options.  Plan to meet in 2 weeks.

## 2013-03-16 LAB — HIV-1 RNA QUANT-NO REFLEX-BLD
HIV 1 RNA Quant: <20 copies/mL (ref <20)
HIV-1 RNA Quant, Log: <1.3 {Log} (ref <1.30)

## 2013-03-17 ENCOUNTER — Telehealth: Payer: Self-pay | Admitting: *Deleted

## 2013-03-17 ENCOUNTER — Ambulatory Visit: Payer: Self-pay

## 2013-03-17 ENCOUNTER — Other Ambulatory Visit: Payer: Self-pay | Admitting: Internal Medicine

## 2013-03-17 DIAGNOSIS — B20 Human immunodeficiency virus [HIV] disease: Secondary | ICD-10-CM

## 2013-03-17 LAB — LIPID PANEL
Cholesterol: 230 mg/dL — ABNORMAL HIGH (ref 0–200)
Total CHOL/HDL Ratio: 4.5 Ratio
Triglycerides: 194 mg/dL — ABNORMAL HIGH (ref <150)
VLDL: 39 mg/dL (ref 0–40)

## 2013-03-17 NOTE — Telephone Encounter (Signed)
Patient wanted to know the results of her cholesterol test, I told her that lab was not ordered. She was concerned that her meds may be affecting her cholesterol. Spoke with the lab and they were able to add a lipid profile. Wendall Mola

## 2013-03-17 NOTE — Addendum Note (Signed)
Addended bySteva Colder on: 03/17/2013 02:20 PM   Modules accepted: Orders

## 2013-03-19 ENCOUNTER — Encounter: Payer: Self-pay | Admitting: Internal Medicine

## 2013-03-28 ENCOUNTER — Encounter: Payer: Self-pay | Admitting: Internal Medicine

## 2013-03-29 ENCOUNTER — Ambulatory Visit: Payer: Self-pay

## 2013-03-29 DIAGNOSIS — F4320 Adjustment disorder, unspecified: Secondary | ICD-10-CM

## 2013-03-29 NOTE — Progress Notes (Signed)
Wanda Kane reported that things are going much better for her now.  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  She said she starts classes tomorrow.  She also has found an apartment through the [employer] program and it is where she used to live.  She said she loved living there before and is excited about moving back there.  She seems to be in a much calmer mood now that she is moving toward goals she has set.  Plan to meet again in 2 weeks.

## 2013-04-12 ENCOUNTER — Ambulatory Visit: Payer: PRIVATE HEALTH INSURANCE

## 2013-04-14 ENCOUNTER — Other Ambulatory Visit: Payer: PRIVATE HEALTH INSURANCE

## 2013-04-14 DIAGNOSIS — R748 Abnormal levels of other serum enzymes: Secondary | ICD-10-CM

## 2013-04-14 LAB — BASIC METABOLIC PANEL WITH GFR
BUN: 11 mg/dL (ref 6–23)
Calcium: 9.2 mg/dL (ref 8.4–10.5)
GFR, Est African American: 70 mL/min
Glucose, Bld: 132 mg/dL — ABNORMAL HIGH (ref 70–99)

## 2013-04-19 ENCOUNTER — Encounter: Payer: Self-pay | Admitting: Internal Medicine

## 2013-04-28 ENCOUNTER — Ambulatory Visit: Payer: Self-pay | Admitting: Internal Medicine

## 2013-05-03 ENCOUNTER — Ambulatory Visit: Payer: PRIVATE HEALTH INSURANCE

## 2013-05-03 DIAGNOSIS — F4323 Adjustment disorder with mixed anxiety and depressed mood: Secondary | ICD-10-CM

## 2013-05-03 NOTE — Progress Notes (Signed)
Wanda Kane reported that she is overwhelmed with summer school at [employer], taking Anatomy & Physiology and a computer course.  She said she is struggling with the pace of it and hopes she can pass it.  She also reported that she had a strong cup of coffee recently at [facility] and began having panic attacks afterward.  She said she has been breathing and talking herself "down", and has felt some improvement.  I provided some psycho-education on mindfulness and meditation.  I facilitated a brief guided meditation and she said she used to meditate and agrees that this will help her focus and stay relaxed.  Plan to meet in 2 weeks.

## 2013-05-16 ENCOUNTER — Ambulatory Visit: Payer: PRIVATE HEALTH INSURANCE

## 2013-05-19 ENCOUNTER — Ambulatory Visit: Payer: PRIVATE HEALTH INSURANCE

## 2013-05-25 ENCOUNTER — Ambulatory Visit: Payer: Self-pay | Admitting: Internal Medicine

## 2013-06-01 ENCOUNTER — Ambulatory Visit (INDEPENDENT_AMBULATORY_CARE_PROVIDER_SITE_OTHER): Payer: PRIVATE HEALTH INSURANCE | Admitting: Infectious Disease

## 2013-06-01 ENCOUNTER — Encounter: Payer: Self-pay | Admitting: Infectious Disease

## 2013-06-01 VITALS — BP 146/77 | HR 96 | Temp 98.5°F | Wt 236.0 lb

## 2013-06-01 DIAGNOSIS — R739 Hyperglycemia, unspecified: Secondary | ICD-10-CM

## 2013-06-01 DIAGNOSIS — R7309 Other abnormal glucose: Secondary | ICD-10-CM

## 2013-06-01 DIAGNOSIS — Z21 Asymptomatic human immunodeficiency virus [HIV] infection status: Secondary | ICD-10-CM

## 2013-06-01 DIAGNOSIS — IMO0001 Reserved for inherently not codable concepts without codable children: Secondary | ICD-10-CM

## 2013-06-01 DIAGNOSIS — B20 Human immunodeficiency virus [HIV] disease: Secondary | ICD-10-CM

## 2013-06-01 DIAGNOSIS — M6282 Rhabdomyolysis: Secondary | ICD-10-CM

## 2013-06-01 LAB — HEPATIC FUNCTION PANEL
ALT: 19 U/L (ref 0–35)
Bilirubin, Direct: 0.1 mg/dL (ref 0.0–0.3)
Indirect Bilirubin: 0.2 mg/dL (ref 0.0–0.9)
Total Bilirubin: 0.3 mg/dL (ref 0.3–1.2)

## 2013-06-01 LAB — HEMOGLOBIN A1C: Mean Plasma Glucose: 128 mg/dL — ABNORMAL HIGH (ref <117)

## 2013-06-01 NOTE — Progress Notes (Signed)
Subjective:    Patient ID: Wanda Kane, female    DOB: [birth year], 52 y.o.   MRN: [id number]  HPI   Ms Sudbeck is a 52 year old African American lady with HIV previous he cared for [facility] and then moved to [place].   While in [place] she apparently was on various antiretroviral regimens lastly on Isentress and complera.   She moved back in July was seen by Dr. Drue Second in February. Her regimen was simplified to STRIBILD. She had tolerated this regimen without to much complaint other some nausea and loose stools.   However week prior to her seeing me in March she had  developed increasingly severe diffuse myalgias in her neck arms legs. She had similar symptoms in the past while out on antiretrovirals. She could not initially recall which antiretrovirals at apparently been blamed for the symptoms. However when I examined her allergy history he or she is listed as being allergic to tenofovir with alleged tenofovir-induced rhabdomyolysis.  I  therefore crafting a new ARV regimen for her without TNF, namely once daily Tivicay, Edurant and Epivir. I  checked her for elevated CPK  Which was at 237. Since change to new TNF free regimen she had down titration of her CK to normal but now it is back above 200 again.   She has not been seen by Rheum due to lack of insurance.  I offered further Rheum workup with labs today as one step to work this up.  She actually is currently without muscle pain which when she has it are typically in the lower calves.   With re to her ARV I offered her simplification to Tivicay/Epzicom--> STR but she preferred to be on current regimen.      Review of Systems  Constitutional: Negative for fever, chills, diaphoresis, activity change, appetite change, fatigue and unexpected weight change.  HENT: Positive for neck stiffness. Negative for congestion, sore throat, rhinorrhea, sneezing, trouble swallowing and sinus pressure.   Eyes: Negative for  photophobia and visual disturbance.  Respiratory: Negative for cough, chest tightness, shortness of breath, wheezing and stridor.   Cardiovascular: Negative for chest pain, palpitations and leg swelling.  Gastrointestinal: Positive for nausea and diarrhea. Negative for vomiting, abdominal pain, constipation, blood in stool, abdominal distention and anal bleeding.  Genitourinary: Negative for dysuria, hematuria, flank pain and difficulty urinating.  Musculoskeletal: Positive for myalgias and arthralgias. Negative for back pain, joint swelling and gait problem.  Skin: Negative for color change, pallor, rash and wound.  Neurological: Negative for dizziness, tremors, weakness and light-headedness.  Hematological: Negative for adenopathy. Does not bruise/bleed easily.  Psychiatric/Behavioral: Negative for behavioral problems, confusion, sleep disturbance, dysphoric mood, decreased concentration and agitation.       Objective:   Physical Exam  Constitutional: She is oriented to person, place, and time. She appears well-developed and well-nourished. No distress.  HENT:  Head: Normocephalic and atraumatic.  Mouth/Throat: Oropharynx is clear and moist. No oropharyngeal exudate.  Eyes: Conjunctivae and EOM are normal. Pupils are equal, round, and reactive to light. No scleral icterus.  Neck: Normal range of motion. Neck supple. No JVD present.  Cardiovascular: Normal rate, regular rhythm and normal heart sounds.  Exam reveals no gallop and no friction rub.   No murmur heard. Pulmonary/Chest: Effort normal and breath sounds normal. No respiratory distress. She has no wheezes. She has no rales. She exhibits no tenderness.  Abdominal: She exhibits no distension and no mass. There is no tenderness. There is no rebound and  no guarding.  Musculoskeletal: She exhibits no edema and no tenderness.  Lymphadenopathy:    She has no cervical adenopathy.  Neurological: She is alert and oriented to person, place,  and time. She exhibits normal muscle tone. Coordination normal.  Skin: Skin is warm and dry. She is not diaphoretic. No erythema. No pallor.  Psychiatric: She has a normal mood and affect. Her behavior is normal. Judgment and thought content normal.          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 I suspect that in reality she possibly has an  autoimmune myositis that UNRELATED to her ARV,  OR she had an isolated episode of rhabo that was not related to ARV and that she has a chronically elevated CPK not related to muscle injury. she has been on multiple antiretroviral medications at all included tenofovir HOWEVER given the alleged TNF induced myositis history of now construct adenopathy or free regimen with the following medications:  : Issued rheumatological workup with sedimentation rate C-reactive protein rheumatoid factor antinuclear antibody ankle SSA SSB antibodies anti-Jo antibody anti-signal recognition peptide antibody anti-MI 2 body LDH and liver function tests.  I spent greater than 45 minutes with the patient including greater than 50% of time in face to face counsel of the patient and in coordination of their care. I will have her labs checked today, including cpk, aldolase, LDH, urinalysis  If she fails to improve with change in antiretroviral regimen I'll consider giving steroids.  HIV: See above discussion: she likes current regimen and not in hurry to be on STR which I have offered her.  Hyperglycemia: Patient noted that her blood tests showed her blood glucose to be 136. Doubt she was fasting at that point time I will check hemoglobin A1c.

## 2013-06-02 LAB — C-REACTIVE PROTEIN: CRP: 0.5 mg/dL (ref <0.60)

## 2013-06-02 LAB — ANCA SCREEN W REFLEX TITER
Atypical p-ANCA Screen: NEGATIVE
p-ANCA Screen: NEGATIVE

## 2013-06-02 LAB — T-HELPER CELL (CD4) - (RCID CLINIC ONLY)
CD4 % Helper T Cell: 20 % — ABNORMAL LOW (ref 33–55)
CD4 T Cell Abs: 710 uL (ref 400–2700)

## 2013-06-02 LAB — HIV-1 RNA QUANT-NO REFLEX-BLD: HIV-1 RNA Quant, Log: <1.3 {Log} (ref <1.30)

## 2013-06-02 LAB — SJOGREN'S SYNDROME ANTIBODS(SSA + SSB): SSA (Ro) (ENA) Antibody, IgG: 3 AU/mL (ref <30)

## 2013-06-06 ENCOUNTER — Ambulatory Visit: Payer: PRIVATE HEALTH INSURANCE

## 2013-06-09 ENCOUNTER — Ambulatory Visit (INDEPENDENT_AMBULATORY_CARE_PROVIDER_SITE_OTHER): Payer: PRIVATE HEALTH INSURANCE | Admitting: *Deleted

## 2013-06-09 DIAGNOSIS — B20 Human immunodeficiency virus [HIV] disease: Secondary | ICD-10-CM

## 2013-06-09 DIAGNOSIS — Z1239 Encounter for other screening for malignant neoplasm of breast: Secondary | ICD-10-CM

## 2013-06-09 DIAGNOSIS — Z124 Encounter for screening for malignant neoplasm of cervix: Secondary | ICD-10-CM

## 2013-06-09 DIAGNOSIS — Z23 Encounter for immunization: Secondary | ICD-10-CM

## 2013-06-09 NOTE — Patient Instructions (Addendum)
  Your results will be ready in about a week.  You will be able to look at them in MyChart.  Thank you for coming to the Center for your care.  Angelique Blonder

## 2013-06-09 NOTE — Progress Notes (Addendum)
  Subjective:     Wanda Kane is a 52 y.o. woman who comes in today for a  pap smear only.. Previous abnormal Pap smears: no. Contraception: condoms   Objective:    There were no vitals taken for this visit. Pelvic Exam: Pap smear obtained.   Assessment:    Screening pap smear.   Plan:    Follow up in one year or as indicated by Pap results.  Pt given educational materials re: HIV and diet, nutrition, exercise, BSE, health promotion, self-esteem, partner protection and PAP smears. Given condoms.

## 2013-06-14 ENCOUNTER — Encounter: Payer: Self-pay | Admitting: *Deleted

## 2013-06-14 ENCOUNTER — Encounter: Payer: Self-pay | Admitting: Infectious Disease

## 2013-06-14 ENCOUNTER — Telehealth: Payer: Self-pay | Admitting: *Deleted

## 2013-06-14 NOTE — Telephone Encounter (Signed)
Please see patient's mychart message.   Hello,      What do think about the lab results for a rheumatoid referral? Or is everything normal?      Wanda Kane

## 2013-06-15 ENCOUNTER — Encounter: Payer: Self-pay | Admitting: Infectious Disease

## 2013-06-15 ENCOUNTER — Encounter: Payer: Self-pay | Admitting: *Deleted

## 2013-06-15 NOTE — Telephone Encounter (Signed)
Thanks Michelle

## 2013-06-15 NOTE — Telephone Encounter (Signed)
All of the labs I reviewed looked normal. I think IF she has NO symptoms right now then Rheum referral unlikely to be high yield. If she still has symptoms can do it but if she has no payor source could be pricy

## 2013-06-15 NOTE — Telephone Encounter (Signed)
Sent your reply to [facility] via MyChart.

## 2013-06-16 ENCOUNTER — Ambulatory Visit: Payer: PRIVATE HEALTH INSURANCE

## 2013-06-16 DIAGNOSIS — F4320 Adjustment disorder, unspecified: Secondary | ICD-10-CM

## 2013-06-16 NOTE — Progress Notes (Signed)
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  She is also settling in to her new apartment, with financial help from her father, buying furniture, etc.  She said she feels like she is finally on track with her life, with a plan now of what she wants to do.  Plan to meet again in 3 weeks.

## 2013-06-28 ENCOUNTER — Ambulatory Visit (HOSPITAL_COMMUNITY)
Admission: RE | Admit: 2013-06-28 | Discharge: 2013-06-28 | Disposition: A | Discharge status: Home / Self Care | Payer: Self-pay | Source: Ambulatory Visit | Attending: Internal Medicine | Admitting: Internal Medicine

## 2013-06-28 DIAGNOSIS — Z1239 Encounter for other screening for malignant neoplasm of breast: Secondary | ICD-10-CM

## 2013-07-09 ENCOUNTER — Encounter: Payer: Self-pay | Admitting: Infectious Disease

## 2013-07-11 ENCOUNTER — Other Ambulatory Visit: Payer: Self-pay | Admitting: *Deleted

## 2013-07-11 DIAGNOSIS — I10 Essential (primary) hypertension: Secondary | ICD-10-CM

## 2013-07-11 MED ORDER — SPIRONOLACTONE 100 MG PO TABS: 100.0000 mg | ORAL_TABLET | Freq: Every day | ORAL | Status: DC

## 2013-07-11 MED ORDER — AMLODIPINE BESYLATE 10 MG PO TABS: 10.0000 mg | ORAL_TABLET | Freq: Every day | ORAL | Status: DC

## 2013-07-11 MED ORDER — LISINOPRIL 10 MG PO TABS: 10.0000 mg | ORAL_TABLET | Freq: Every day | ORAL | Status: DC

## 2013-07-12 ENCOUNTER — Ambulatory Visit: Payer: PRIVATE HEALTH INSURANCE

## 2013-07-28 ENCOUNTER — Encounter: Payer: Self-pay | Admitting: Infectious Disease

## 2013-08-02 ENCOUNTER — Ambulatory Visit: Payer: Self-pay

## 2013-09-12 ENCOUNTER — Encounter: Payer: Self-pay | Admitting: Infectious Disease

## 2013-09-13 ENCOUNTER — Telehealth: Payer: Self-pay | Admitting: *Deleted

## 2013-09-13 NOTE — Telephone Encounter (Signed)
Patient is reporting increased muscle cramps and spasms, wants to move her lab appointment up to this week and add a CK and thyroid level - are these additional orders ok?  Should she be seen before her appointment on 11/26?   Please see the email text below. [name], RN  _________ Hello,   I would like to move up the labs appt and add another ck level & a thyroid level. I've had muscle spasms and cramps all over my body for the past 2 days, along with hearing my heart beat in my right ear whenever I do most anything? The muscle problem hasn't completely subsided, it ranges from minimal to intense and I just try to cope. We have to figure out what is going on.   Wanda Kane

## 2013-09-14 ENCOUNTER — Other Ambulatory Visit (INDEPENDENT_AMBULATORY_CARE_PROVIDER_SITE_OTHER): Payer: No Typology Code available for payment source

## 2013-09-14 ENCOUNTER — Other Ambulatory Visit: Payer: Self-pay | Admitting: *Deleted

## 2013-09-14 DIAGNOSIS — B20 Human immunodeficiency virus [HIV] disease: Secondary | ICD-10-CM

## 2013-09-14 DIAGNOSIS — Z79899 Other long term (current) drug therapy: Secondary | ICD-10-CM

## 2013-09-14 DIAGNOSIS — Z113 Encounter for screening for infections with a predominantly sexual mode of transmission: Secondary | ICD-10-CM

## 2013-09-14 DIAGNOSIS — E785 Hyperlipidemia, unspecified: Secondary | ICD-10-CM

## 2013-09-14 LAB — COMPLETE METABOLIC PANEL WITH GFR
ALT: 13 U/L (ref 0–35)
AST: 24 U/L (ref 0–37)
Albumin: 4.4 g/dL (ref 3.5–5.2)
Alkaline Phosphatase: 78 U/L (ref 39–117)
BUN: 9 mg/dL (ref 6–23)
CO2: 22 mEq/L (ref 19–32)
Calcium: 9.8 mg/dL (ref 8.4–10.5)
Chloride: 100 mEq/L (ref 96–112)
Creat: 1.13 mg/dL — ABNORMAL HIGH (ref 0.50–1.10)
GFR, Est African American: 65 mL/min
GFR, Est Non African American: 56 mL/min — ABNORMAL LOW
Glucose, Bld: 100 mg/dL — ABNORMAL HIGH (ref 70–99)
Potassium: 4.4 mEq/L (ref 3.5–5.3)
Sodium: 133 mEq/L — ABNORMAL LOW (ref 135–145)
Total Bilirubin: 0.5 mg/dL (ref 0.3–1.2)
Total Protein: 7.9 g/dL (ref 6.0–8.3)

## 2013-09-14 LAB — CBC WITH DIFFERENTIAL/PLATELET
Basophils Absolute: 0 10*3/uL (ref 0.0–0.1)
Basophils Relative: 1 % (ref 0–1)
Eosinophils Absolute: 0.1 10*3/uL (ref 0.0–0.7)
Eosinophils Relative: 2 % (ref 0–5)
HCT: 28.4 % — ABNORMAL LOW (ref 36.0–46.0)
Hemoglobin: 9 g/dL — ABNORMAL LOW (ref 12.0–15.0)
Lymphocytes Relative: 42 % (ref 12–46)
Lymphs Abs: 3.4 10*3/uL (ref 0.7–4.0)
MCH: 22.7 pg — ABNORMAL LOW (ref 26.0–34.0)
MCHC: 31.7 g/dL (ref 30.0–36.0)
MCV: 71.5 fL — ABNORMAL LOW (ref 78.0–100.0)
Monocytes Absolute: 0.6 10*3/uL (ref 0.1–1.0)
Monocytes Relative: 7 % (ref 3–12)
Neutro Abs: 4 10*3/uL (ref 1.7–7.7)
Neutrophils Relative %: 48 % (ref 43–77)
Platelets: 505 10*3/uL — ABNORMAL HIGH (ref 150–400)
RBC: 3.97 MIL/uL (ref 3.87–5.11)
RDW: 15.9 % — ABNORMAL HIGH (ref 11.5–15.5)
WBC: 8.2 10*3/uL (ref 4.0–10.5)

## 2013-09-14 LAB — CK TOTAL AND CKMB (NOT AT ARMC)
CK, MB: 1.5 ng/mL (ref 0.3–4.0)
Relative Index: 0.4 (ref 0.0–2.5)
Total CK: 378 U/L — ABNORMAL HIGH (ref 7–177)

## 2013-09-14 LAB — TSH: TSH: 1.366 u[IU]/mL (ref 0.350–4.500)

## 2013-09-14 LAB — LIPID PANEL
Cholesterol: 214 mg/dL — ABNORMAL HIGH (ref 0–200)
HDL: 53 mg/dL (ref >39)
Total CHOL/HDL Ratio: 4 Ratio
Triglycerides: 139 mg/dL (ref <150)

## 2013-09-14 LAB — RPR

## 2013-09-14 NOTE — Telephone Encounter (Signed)
That is fine to have the CK and TSH checked. Her CK has been chronically elevated regardless of her HIV meds. I cannot recall if she ever saw a Rheumatologist re this or not, or if we ever gave her steroids.

## 2013-09-15 LAB — T-HELPER CELL (CD4) - (RCID CLINIC ONLY): CD4 % Helper T Cell: 17 % — ABNORMAL LOW (ref 33–55)

## 2013-09-15 LAB — HIV-1 RNA QUANT-NO REFLEX-BLD: HIV 1 RNA Quant: <20 copies/mL (ref <20)

## 2013-09-20 ENCOUNTER — Encounter: Payer: Self-pay | Admitting: Infectious Disease

## 2013-09-22 ENCOUNTER — Other Ambulatory Visit: Payer: PRIVATE HEALTH INSURANCE

## 2013-10-12 ENCOUNTER — Encounter: Payer: Self-pay | Admitting: Infectious Disease

## 2013-10-12 ENCOUNTER — Ambulatory Visit (INDEPENDENT_AMBULATORY_CARE_PROVIDER_SITE_OTHER): Payer: No Typology Code available for payment source | Admitting: Infectious Disease

## 2013-10-12 VITALS — BP 129/73 | HR 108 | Temp 98.3°F | Wt 228.0 lb

## 2013-10-12 DIAGNOSIS — D649 Anemia, unspecified: Secondary | ICD-10-CM

## 2013-10-12 DIAGNOSIS — Z21 Asymptomatic human immunodeficiency virus [HIV] infection status: Secondary | ICD-10-CM

## 2013-10-12 DIAGNOSIS — B20 Human immunodeficiency virus [HIV] disease: Secondary | ICD-10-CM

## 2013-10-12 LAB — COMPLETE METABOLIC PANEL WITH GFR
ALT: <8 U/L (ref 0–35)
AST: 16 U/L (ref 0–37)
CO2: 24 mEq/L (ref 19–32)
Creat: 0.79 mg/dL (ref 0.50–1.10)
GFR, Est African American: >89 mL/min
Glucose, Bld: 102 mg/dL — ABNORMAL HIGH (ref 70–99)
Total Bilirubin: 0.3 mg/dL (ref 0.3–1.2)

## 2013-10-12 LAB — CBC WITH DIFFERENTIAL/PLATELET
Basophils Absolute: 0 10*3/uL (ref 0.0–0.1)
Basophils Relative: 0 % (ref 0–1)
Eosinophils Relative: 1 % (ref 0–5)
HCT: 24.6 % — ABNORMAL LOW (ref 36.0–46.0)
Hemoglobin: 7.9 g/dL — ABNORMAL LOW (ref 12.0–15.0)
Lymphocytes Relative: 32 % (ref 12–46)
Lymphs Abs: 3 10*3/uL (ref 0.7–4.0)
Monocytes Absolute: 0.7 10*3/uL (ref 0.1–1.0)
Monocytes Relative: 7 % (ref 3–12)
Neutro Abs: 5.4 10*3/uL (ref 1.7–7.7)
RBC: 3.47 MIL/uL — ABNORMAL LOW (ref 3.87–5.11)
RDW: 16.4 % — ABNORMAL HIGH (ref 11.5–15.5)
WBC: 9.2 10*3/uL (ref 4.0–10.5)

## 2013-10-12 LAB — LACTATE DEHYDROGENASE: LDH: 144 U/L (ref 94–250)

## 2013-10-12 LAB — IBC PANEL
%SAT: 2 % — ABNORMAL LOW (ref 20–55)
TIBC: 479 ug/dL — ABNORMAL HIGH (ref 250–470)

## 2013-10-12 LAB — FERRITIN: Ferritin: 1 ng/mL — ABNORMAL LOW (ref 10–291)

## 2013-10-12 NOTE — Progress Notes (Signed)
Subjective:    Patient ID: Wanda Kane, female    DOB: 04-Sep-1961, 52 y.o.   MRN: [id number]  HPI   Wanda Kane is a 52 year old African American lady with HIV previous he cared for Wanda Kane and then moved to [place].   While in [place] she apparently was on various antiretroviral regimens lastly on Isentress and complera.   She moved back in July was seen by Dr. Drue Second in February. Her regimen was simplified to STRIBILD. She had tolerated this regimen without to much complaint other some nausea and loose stools.   However week prior to her seeing me in March she had  developed increasingly severe diffuse myalgias in her neck arms legs. She had similar symptoms in the past while out on antiretrovirals. She could not initially recall which antiretrovirals at apparently been blamed for the symptoms. However when I examined her allergy history he or she is listed as being allergic to tenofovir with alleged   tenofovir-induced rhabdomyolysis.  I  therefore crafting a new ARV regimen for her without TNF, namely once daily Tivicay, Edurant and Epivir. I  checked her for elevated CPK  Which was at 237. Since change to new TNF free regimen she had down titration of her CK to normal but now it is back above 200 again.   She has not been seen by Rheum due to lack of insurance.  I offered further Rheum workup with labs was unrevealing   She is doing well. She has had improved with re to her myalgias, with increased exercise.     Review of Systems  Constitutional: Negative for fever, chills, diaphoresis, activity change, appetite change, fatigue and unexpected weight change.  HENT: Negative for congestion, rhinorrhea, sinus pressure, sneezing, sore throat and trouble swallowing.   Eyes: Negative for photophobia and visual disturbance.  Respiratory: Negative for cough, chest tightness, shortness of breath, wheezing and stridor.   Cardiovascular: Negative for chest pain, palpitations  and leg swelling.  Gastrointestinal: Negative for nausea, vomiting, abdominal pain, diarrhea, constipation, blood in stool, abdominal distention and anal bleeding.  Genitourinary: Negative for dysuria, hematuria, flank pain and difficulty urinating.  Musculoskeletal: Negative for arthralgias, back pain, gait problem, joint swelling, myalgias and neck stiffness.  Skin: Negative for color change, pallor, rash and wound.  Neurological: Negative for dizziness, tremors, weakness and light-headedness.  Hematological: Negative for adenopathy. Does not bruise/bleed easily.  Psychiatric/Behavioral: Negative for behavioral problems, confusion, sleep disturbance, dysphoric mood, decreased concentration and agitation.       Objective:   Physical Exam  Constitutional: She is oriented to person, place, and time. She appears well-developed and well-nourished. No distress.  HENT:  Head: Normocephalic and atraumatic.  Mouth/Throat: Oropharynx is clear and moist. No oropharyngeal exudate.  Eyes: Conjunctivae and EOM are normal. Pupils are equal, round, and reactive to light. No scleral icterus.  Neck: Normal range of motion. Neck supple. No JVD present.  Cardiovascular: Normal rate, regular rhythm and normal heart sounds.  Exam reveals no gallop and no friction rub.   No murmur heard. Pulmonary/Chest: Effort normal and breath sounds normal. No respiratory distress. She has no wheezes. She has no rales. She exhibits no tenderness.  Abdominal: She exhibits no distension and no mass. There is no tenderness. There is no rebound and no guarding.  Musculoskeletal: She exhibits no edema and no tenderness.  Lymphadenopathy:    She has no cervical adenopathy.  Neurological: She is alert and oriented to person, place, and time. She exhibits normal  muscle tone. Coordination normal.  Skin: Skin is warm and dry. She is not diaphoretic. No erythema. No pallor.  Psychiatric: She has a normal mood and affect. Her behavior  is normal. Judgment and thought content normal.          Assessment & Plan:     HIV: continue current regimen I spent greater than 45 minutes with the patient including greater than 50% of time in face to face counsel of the patient and in coordination of their care.   Myositis: not clear what is causing this but she is symptomatically better  Thrombocytosis: Plts up to 500, recheck  Anemia: workup with iron panel, b12, ldh

## 2013-10-14 LAB — FOLATE RBC: RBC Folate: 1426 ng/mL (ref >280)

## 2013-10-17 ENCOUNTER — Telehealth: Payer: Self-pay | Admitting: Infectious Disease

## 2013-10-17 MED ORDER — FERROUS SULFATE 325 (65 FE) MG PO TBEC: 325.0000 mg | DELAYED_RELEASE_TABLET | Freq: Two times a day (BID) | ORAL | Status: DC

## 2013-10-17 NOTE — Telephone Encounter (Signed)
Wanda Kane is severely iron deficient   She needs to start iron sulfate   Can we make sure she is taking food with her Tivicay because the iron can interfere with absorption of the TIVICAY if not also taken with food   Otherwise  recommendation is for TIVICAY to be taken   2 hours before or 6 hours after oral iron salts

## 2013-10-17 NOTE — Telephone Encounter (Signed)
I notified the patient, she will pick up RX.

## 2013-10-20 ENCOUNTER — Ambulatory Visit: Payer: Self-pay

## 2013-10-27 ENCOUNTER — Telehealth: Payer: Self-pay | Admitting: *Deleted

## 2013-10-27 NOTE — Telephone Encounter (Signed)
Patient called seeking advice d/t a very heavy menses.  Her cycle started 3 days ago, was a normal flow.  This morning she started with heavy flow and cramping, using 3+ tampons in 1 hour.  Pt endorses history of fibroids and perimenopause symptoms, but states this is more severe than previous cycles.  Pt denies SOB or feeling light headed at this time.  She called asking where she might be seen if her cycle continues with this heavy flow.  RN suggested that if her heavy flow continues or if she develops any additional symptoms to go to an urgent care or ED for evaluation.  Would you like a GYN referral as well?  Please advise. [name], RN

## 2013-10-27 NOTE — Telephone Encounter (Signed)
She should definitely see gynecology. Can we get her an appt with them asap

## 2013-10-28 ENCOUNTER — Other Ambulatory Visit: Payer: Self-pay | Admitting: *Deleted

## 2013-10-28 DIAGNOSIS — N939 Abnormal uterine and vaginal bleeding, unspecified: Secondary | ICD-10-CM

## 2013-10-28 NOTE — Telephone Encounter (Signed)
Sent the request via EPIC.  Will follow up with a phone call to the clinic as well.

## 2013-10-31 ENCOUNTER — Encounter: Payer: Self-pay | Admitting: Obstetrics & Gynecology

## 2013-10-31 NOTE — Telephone Encounter (Signed)
Also referred patient to Earley Abide, our Health Navigator, for additional insurance options.

## 2013-10-31 NOTE — Telephone Encounter (Signed)
Received fax from [facility].  They are unable to schedule patient until February, but will keep her on the ASAP list.  Would you like to try to send her elsewhere?  Pt is uninsured and has limited options.

## 2013-11-01 NOTE — Telephone Encounter (Signed)
Patient last took oral contraceptives in her early 19's. She would like to research it on her own before starting any new medication.  Will call back with her decision.

## 2013-11-01 NOTE — Telephone Encounter (Signed)
None listed in her medications. Left her a message asking to call back and let us know if 1) she is currently on any oral birth control, 2) if any had worked well for her in the past, or 3) if she had problems with any in the past.

## 2013-11-01 NOTE — Telephone Encounter (Signed)
That is the ONLY way we can control her bleeding unless she gets Gyn surgery or IUD that releases hormones locally. Otherwise there is nothing I can do

## 2013-11-01 NOTE — Telephone Encounter (Signed)
Wanda Kane is the pt on oral contaceptives at present? We could get her on somethijng to control vaginal bleeding until she is seen by Ob Gyn

## 2013-11-21 ENCOUNTER — Encounter: Payer: Self-pay | Admitting: Infectious Disease

## 2013-12-12 ENCOUNTER — Ambulatory Visit: Payer: Self-pay

## 2013-12-12 ENCOUNTER — Other Ambulatory Visit (INDEPENDENT_AMBULATORY_CARE_PROVIDER_SITE_OTHER): Payer: Self-pay

## 2013-12-12 DIAGNOSIS — IMO0001 Reserved for inherently not codable concepts without codable children: Secondary | ICD-10-CM

## 2013-12-12 DIAGNOSIS — M6282 Rhabdomyolysis: Secondary | ICD-10-CM

## 2013-12-12 DIAGNOSIS — B20 Human immunodeficiency virus [HIV] disease: Secondary | ICD-10-CM

## 2013-12-12 DIAGNOSIS — Z113 Encounter for screening for infections with a predominantly sexual mode of transmission: Secondary | ICD-10-CM

## 2013-12-12 DIAGNOSIS — R739 Hyperglycemia, unspecified: Secondary | ICD-10-CM

## 2013-12-12 LAB — CBC WITH DIFFERENTIAL/PLATELET
Basophils Absolute: 0 10*3/uL (ref 0.0–0.1)
Basophils Relative: 0 % (ref 0–1)
EOS ABS: 0.1 10*3/uL (ref 0.0–0.7)
Eosinophils Relative: 2 % (ref 0–5)
HCT: 36.2 % (ref 36.0–46.0)
Hemoglobin: 11.9 g/dL — ABNORMAL LOW (ref 12.0–15.0)
LYMPHS ABS: 2.8 10*3/uL (ref 0.7–4.0)
Lymphocytes Relative: 43 % (ref 12–46)
MCH: 28.2 pg (ref 26.0–34.0)
MCHC: 32.9 g/dL (ref 30.0–36.0)
MCV: 85.8 fL (ref 78.0–100.0)
Monocytes Absolute: 0.4 10*3/uL (ref 0.1–1.0)
Monocytes Relative: 6 % (ref 3–12)
Neutro Abs: 3.2 10*3/uL (ref 1.7–7.7)
Neutrophils Relative %: 49 % (ref 43–77)
PLATELETS: 321 10*3/uL (ref 150–400)
RBC: 4.22 MIL/uL (ref 3.87–5.11)
RDW: 20.5 % — ABNORMAL HIGH (ref 11.5–15.5)
WBC: 6.6 10*3/uL (ref 4.0–10.5)

## 2013-12-12 LAB — COMPLETE METABOLIC PANEL WITH GFR
ALBUMIN: 4.3 g/dL (ref 3.5–5.2)
ALT: 16 U/L (ref 0–35)
AST: 23 U/L (ref 0–37)
Alkaline Phosphatase: 62 U/L (ref 39–117)
BUN: 9 mg/dL (ref 6–23)
CO2: 25 meq/L (ref 19–32)
Calcium: 9.8 mg/dL (ref 8.4–10.5)
Chloride: 102 mEq/L (ref 96–112)
Creat: 0.96 mg/dL (ref 0.50–1.10)
GFR, EST AFRICAN AMERICAN: 78 mL/min
GFR, EST NON AFRICAN AMERICAN: 68 mL/min
GLUCOSE: 117 mg/dL — AB (ref 70–99)
POTASSIUM: 4.3 meq/L (ref 3.5–5.3)
SODIUM: 137 meq/L (ref 135–145)
TOTAL PROTEIN: 7.4 g/dL (ref 6.0–8.3)
Total Bilirubin: 0.4 mg/dL (ref 0.3–1.2)

## 2013-12-12 LAB — RPR

## 2013-12-13 LAB — T-HELPER CELL (CD4) - (RCID CLINIC ONLY)
CD4 % Helper T Cell: 18 % — ABNORMAL LOW (ref 33–55)
CD4 T Cell Abs: 510 /uL (ref 400–2700)

## 2013-12-13 LAB — HIV-1 RNA QUANT-NO REFLEX-BLD
HIV 1 RNA Quant: <20 copies/mL (ref <20)
HIV-1 RNA Quant, Log: <1.3 {Log} (ref <1.30)

## 2013-12-14 ENCOUNTER — Other Ambulatory Visit: Payer: Self-pay | Admitting: *Deleted

## 2013-12-14 DIAGNOSIS — IMO0001 Reserved for inherently not codable concepts without codable children: Secondary | ICD-10-CM

## 2013-12-14 DIAGNOSIS — B20 Human immunodeficiency virus [HIV] disease: Secondary | ICD-10-CM

## 2013-12-14 MED ORDER — LAMIVUDINE 300 MG PO TABS: 300.0000 mg | ORAL_TABLET | Freq: Every day | ORAL | Status: DC

## 2013-12-14 MED ORDER — DOLUTEGRAVIR SODIUM 50 MG PO TABS: 50.0000 mg | ORAL_TABLET | Freq: Every day | ORAL | Status: DC

## 2013-12-19 ENCOUNTER — Encounter: Payer: Self-pay | Admitting: Infectious Disease

## 2013-12-19 ENCOUNTER — Ambulatory Visit (INDEPENDENT_AMBULATORY_CARE_PROVIDER_SITE_OTHER): Payer: No Typology Code available for payment source | Admitting: Obstetrics & Gynecology

## 2013-12-19 ENCOUNTER — Encounter: Payer: Self-pay | Admitting: Obstetrics & Gynecology

## 2013-12-19 VITALS — BP 127/82 | HR 95 | Temp 98.2°F | Ht 64.0 in | Wt 227.7 lb

## 2013-12-19 DIAGNOSIS — N920 Excessive and frequent menstruation with regular cycle: Secondary | ICD-10-CM

## 2013-12-19 DIAGNOSIS — D259 Leiomyoma of uterus, unspecified: Secondary | ICD-10-CM

## 2013-12-19 DIAGNOSIS — N951 Menopausal and female climacteric states: Secondary | ICD-10-CM

## 2013-12-19 MED ORDER — MEGESTROL ACETATE 40 MG PO TABS: 40.0000 mg | ORAL_TABLET | Freq: Two times a day (BID) | ORAL | Status: DC

## 2013-12-19 MED ORDER — DOCUSATE SODIUM 100 MG PO CAPS: 100.0000 mg | ORAL_CAPSULE | Freq: Two times a day (BID) | ORAL | Status: DC | PRN

## 2013-12-19 NOTE — Patient Instructions (Signed)
Menorrhagia  Menorrhagia is the medical term for when your menstrual periods are heavy or last longer than usual. With menorrhagia, every period you have may cause enough blood loss and cramping that you are unable to maintain your usual activities.  CAUSES   In some cases, the cause of heavy periods is unknown, but a number of conditions may cause menorrhagia. Common causes include:  · A problem with the hormone-producing thyroid gland (hypothyroid).  · Noncancerous growths in the uterus (polyps or fibroids).  · An imbalance of the estrogen and progesterone hormones.  · One of your ovaries not releasing an egg during one or more months.  · Side effects of having an intrauterine device (IUD).  · Side effects of some medicines, such as anti-inflammatory medicines or blood thinners.  · A bleeding disorder that stops your blood from clotting normally.  SIGNS AND SYMPTOMS   During a normal period, bleeding lasts between 4 and 8 days. Signs that your periods are too heavy include:  · You routinely have to change your pad or tampon every 1 or 2 hours because it is completely soaked.  · You pass blood clots larger than 1 inch (2.5 cm) in size.  · You have bleeding for more than 7 days.  · You need to use pads and tampons at the same time because of heavy bleeding.  · You need to wake up to change your pads or tampons during the night.  · You have symptoms of anemia, such as tiredness, fatigue, or shortness of breath.   DIAGNOSIS   Your health care provider will perform a physical exam and ask you questions about your symptoms and menstrual history. Other tests may be ordered based on what the health care provider finds during the exam. These tests can include:  · Blood tests To check if you are pregnant or have hormonal changes, a bleeding or thyroid disorder, low iron levels (anemia), or other problems.  · Endometrial biopsy Your health care provider takes a sample of tissue from the inside of your uterus to be examined  under a microscope.  · Pelvic ultrasound This test uses sound waves to make a picture of your uterus, ovaries, and vagina. The pictures can show if you have fibroids or other growths.  · Hysteroscopy For this test, your health care provider will use a small telescope to look inside your uterus.  Based on the results of your initial tests, your health care provider may recommend further testing.  TREATMENT   Treatment may not be needed. If it is needed, your health care provider may recommend treatment with one or more medicines first. If these do not reduce bleeding enough, a surgical treatment might be an option. The best treatment for you will depend on:   · Whether you need to prevent pregnancy.    · Your desire to have children in the future.  · The cause and severity of your bleeding.  · Your opinion and personal preference.    Medicines for menorrhagia may include:  · Birth control methods that use hormones These include the pill, skin patch, vaginal ring, shots that you get every 3 months, hormonal IUD, and implant. These treatments reduce bleeding during your menstrual period.  · Medicines that thicken blood and slow bleeding.  · Medicines that reduce swelling, such as ibuprofen.   · Medicines that contain a synthetic hormone called progestin.    · Medicines that make the ovaries stop working for a short time.    You may need surgical   treatment for menorrhagia if the medicines are unsuccessful. Treatment options include:  · Dilation and curettage (D&C) In this procedure, your health care provider opens (dilates) your cervix and then scrapes or suctions tissue from the lining of your uterus to reduce menstrual bleeding.  · Operative hysteroscopy This procedure uses a tiny tube with a light (hysteroscope) to view your uterine cavity and can help in the surgical removal of a polyp that may be causing heavy periods.  · Endometrial ablation Through various techniques, your health care provider permanently  destroys the entire lining of your uterus (endometrium). After endometrial ablation, most women have little or no menstrual flow. Endometrial ablation reduces your ability to become pregnant.  · Endometrial resection This surgical procedure uses an electrosurgical wire loop to remove the lining of the uterus. This procedure also reduces your ability to become pregnant.  · Hysterectomy Surgical removal of the uterus and cervix is a permanent procedure that stops menstrual periods. Pregnancy is not possible after a hysterectomy. This procedure requires anesthesia and hospitalization.  HOME CARE INSTRUCTIONS   · Only take over-the-counter or prescription medicines as directed by your health care provider. Take prescribed medicines exactly as directed. Do not change or switch medicines without consulting your health care provider.  · Take any prescribed iron pills exactly as directed by your health care provider. Long-term heavy bleeding may result in low iron levels. Iron pills help replace the iron your body lost from heavy bleeding. Iron may cause constipation. If this becomes a problem, increase the bran, fruits, and roughage in your diet.  · Do not take aspirin or medicines that contain aspirin 1 week before or during your menstrual period. Aspirin may make the bleeding worse.  · If you need to change your sanitary pad or tampon more than once every 2 hours, stay in bed and rest as much as possible until the bleeding stops.  · Eat well-balanced meals. Eat foods high in iron. Examples are leafy green vegetables, meat, liver, eggs, and whole grain breads and cereals. Do not try to lose weight until the abnormal bleeding has stopped and your blood iron level is back to normal.  SEEK MEDICAL CARE IF:   · You soak through a pad or tampon every 1 or 2 hours, and this happens every time you have a period.  · You need to use pads and tampons at the same time because you are bleeding so much.  · You need to change your pad  or tampon during the night.  · You have a period that lasts for more than 8 days.  · You pass clots bigger than 1 inch wide.  · You have irregular periods that happen more or less often than once a month.  · You feel dizzy or faint.  · You feel very weak or tired.  · You feel short of breath or feel your heart is beating too fast when you exercise.  · You have nausea and vomiting or diarrhea while you are taking your medicine.  · You have any problems that may be related to the medicine you are taking.  SEEK IMMEDIATE MEDICAL CARE IF:   · You soak through 4 or more pads or tampons in 2 hours.  · You have any bleeding while you are pregnant.  MAKE SURE YOU:   · Understand these instructions.  · Will watch your condition.  · Will get help right away if you are not doing well or get worse.    Document Released: 11/03/2005 Document Revised: 08/24/2013 Document Reviewed: 04/24/2013  Exit[employer]® Patient Information ©2014 [employer].

## 2013-12-19 NOTE — Progress Notes (Signed)
   CLINIC ENCOUNTER NOTE  History:  53 y.o. G1P1001 here today for evaluation of menorrhagia, fibroids, and hot flashes.  Menorrhagia only since in the two months; changing pads every 1.5 hours for first two days, lasts seven days.  Hot flashes and night sweats started last week, not debilating.   Last hemoglobin was 11.9 on 12/12/13, on oral ferrous sulfate.  Had ultrasound in 04/2013 in [place] and showed 4 cm anterior fibroid.   The following portions of the patient's history were reviewed and updated as appropriate: allergies, current medications, past family history, past medical history, past social history, past surgical history and problem list.  Pap smear 06/09/13 was normal; also normal mammogram in 07/06/13.   Review of Systems:  Pertinent items are noted in HPI.  Objective:  BP 127/82  Pulse 95  Temp(Src) 98.2 F (36.8 C) (Oral)  Ht 5\' 4"  (1.626 m)  Wt 227 lb 11.2 oz (103.284 kg)  BMI 39.07 kg/m2  LMP 11/20/2013 Physical Exam deferred  Assessment & Plan:   Pelvic ultrasound ordered to evaluate for interval change, patient told she may likely need endometrial biopsy for more evaluation. Megace ordered to be used as needed Continue ferrous sulfate Bleeding precautions reviewed No intervention needed for vasomotor symptoms for now.    Verita Schneiders, MD, [place] Attending [facility], [place]

## 2013-12-22 ENCOUNTER — Encounter: Payer: Self-pay | Admitting: Obstetrics & Gynecology

## 2013-12-22 ENCOUNTER — Ambulatory Visit (HOSPITAL_COMMUNITY)
Admission: RE | Admit: 2013-12-22 | Discharge: 2013-12-22 | Disposition: A | Discharge status: Home / Self Care | Payer: No Typology Code available for payment source | Source: Ambulatory Visit | Attending: Obstetrics & Gynecology | Admitting: Obstetrics & Gynecology

## 2013-12-22 DIAGNOSIS — D251 Intramural leiomyoma of uterus: Secondary | ICD-10-CM | POA: Insufficient documentation

## 2013-12-22 DIAGNOSIS — R7309 Other abnormal glucose: Secondary | ICD-10-CM | POA: Insufficient documentation

## 2013-12-22 DIAGNOSIS — D259 Leiomyoma of uterus, unspecified: Secondary | ICD-10-CM

## 2013-12-22 DIAGNOSIS — D252 Subserosal leiomyoma of uterus: Secondary | ICD-10-CM | POA: Insufficient documentation

## 2013-12-22 DIAGNOSIS — N920 Excessive and frequent menstruation with regular cycle: Secondary | ICD-10-CM | POA: Insufficient documentation

## 2013-12-22 DIAGNOSIS — I10 Essential (primary) hypertension: Secondary | ICD-10-CM | POA: Insufficient documentation

## 2013-12-27 ENCOUNTER — Ambulatory Visit: Payer: Self-pay | Admitting: Infectious Disease

## 2013-12-29 ENCOUNTER — Encounter: Payer: Self-pay | Admitting: Infectious Disease

## 2013-12-29 ENCOUNTER — Ambulatory Visit (INDEPENDENT_AMBULATORY_CARE_PROVIDER_SITE_OTHER): Payer: No Typology Code available for payment source | Admitting: Infectious Disease

## 2013-12-29 ENCOUNTER — Telehealth: Payer: Self-pay | Admitting: General Practice

## 2013-12-29 VITALS — BP 113/76 | HR 103 | Wt 233.0 lb

## 2013-12-29 DIAGNOSIS — D219 Benign neoplasm of connective and other soft tissue, unspecified: Secondary | ICD-10-CM | POA: Insufficient documentation

## 2013-12-29 DIAGNOSIS — Z21 Asymptomatic human immunodeficiency virus [HIV] infection status: Secondary | ICD-10-CM

## 2013-12-29 DIAGNOSIS — IMO0001 Reserved for inherently not codable concepts without codable children: Secondary | ICD-10-CM

## 2013-12-29 DIAGNOSIS — B20 Human immunodeficiency virus [HIV] disease: Secondary | ICD-10-CM

## 2013-12-29 DIAGNOSIS — I10 Essential (primary) hypertension: Secondary | ICD-10-CM

## 2013-12-29 NOTE — Telephone Encounter (Signed)
Pt called and was given information about appointment with Dr. Harolyn Rutherford on 03/05 @ 1400.  Pt verbalizes understanding.

## 2013-12-29 NOTE — Progress Notes (Signed)
Subjective:    Patient ID: Wanda Kane, female    DOB: November 16, 1961, 53 y.o.   MRN: [id number]  HPI   Wanda Kane is a 53 year old African American lady with HIV previous he cared for [facility] and then moved to [place].   While in [place] she apparently was on various antiretroviral regimens lastly on Isentress and complera.   She moved back in July was seen by Dr. Baxter Flattery in February. Her regimen was simplified to STRIBILD. She had tolerated this regimen without to much complaint other some nausea and loose stools.   However week prior to her seeing me in March 2014 she had  developed increasingly severe diffuse myalgias in her neck arms legs. She had similar symptoms in the past while out on antiretrovirals. She could not initially recall which antiretrovirals at apparently been blamed for the symptoms. However when I examined her allergy history he or she is listed as being allergic to tenofovir with alleged   tenofovir-induced rhabdomyolysis.  I  therefore crafted a new ARV regimen for her without TNF, namely once daily Tivicay, Edurant and Epivir. I  checked her for elevated CPK  Which was at 237. Since change to new TNF free regimen she had down titration of her CK to normal but was now  back above 200 again.   She has not been seen by Rheum due to lack of insurance.  I offered further Rheum workup with labs was unrevealing   Since I last saw her she states she is no longer having any problems a myalgias she is going through menopause at present. We discussed the location of her antiretroviral regimen to [facility] but she would like to remain on her current regimen.    Review of Systems  Constitutional: Negative for fever, chills, diaphoresis, activity change, appetite change, fatigue and unexpected weight change.  HENT: Negative for congestion, rhinorrhea, sinus pressure, sneezing, sore throat and trouble swallowing.   Eyes: Negative for photophobia and visual  disturbance.  Respiratory: Negative for cough, chest tightness, shortness of breath, wheezing and stridor.   Cardiovascular: Negative for chest pain, palpitations and leg swelling.  Gastrointestinal: Negative for nausea, vomiting, abdominal pain, diarrhea, constipation, blood in stool, abdominal distention and anal bleeding.  Genitourinary: Negative for dysuria, hematuria, flank pain and difficulty urinating.  Musculoskeletal: Negative for arthralgias, back pain, gait problem, joint swelling, myalgias and neck stiffness.  Skin: Negative for color change, pallor, rash and wound.  Neurological: Negative for dizziness, tremors, weakness and light-headedness.  Hematological: Negative for adenopathy. Does not bruise/bleed easily.  Psychiatric/Behavioral: Negative for behavioral problems, confusion, sleep disturbance, dysphoric mood, decreased concentration and agitation.       Objective:   Physical Exam  Constitutional: She is oriented to person, place, and time. She appears well-developed and well-nourished. No distress.  HENT:  Head: Normocephalic and atraumatic.  Mouth/Throat: Oropharynx is clear and moist. No oropharyngeal exudate.  Eyes: Conjunctivae and EOM are normal. Pupils are equal, round, and reactive to light. No scleral icterus.  Neck: Normal range of motion. Neck supple. No JVD present.  Cardiovascular: Normal rate, regular rhythm and normal heart sounds.  Exam reveals no gallop and no friction rub.   No murmur heard. Pulmonary/Chest: Effort normal and breath sounds normal. No respiratory distress. She has no wheezes. She has no rales. She exhibits no tenderness.  Abdominal: She exhibits no distension and no mass. There is no tenderness. There is no rebound and no guarding.  Musculoskeletal: She exhibits no edema and  no tenderness.  Lymphadenopathy:    She has no cervical adenopathy.  Neurological: She is alert and oriented to person, place, and time. She exhibits normal muscle  tone. Coordination normal.  Skin: Skin is warm and dry. She is not diaphoretic. No erythema. No pallor.  Psychiatric: She has a normal mood and affect. Her behavior is normal. Judgment and thought content normal.          Assessment & Plan:   HIV: continue current regimen I spent greater than 25 minutes with the patient including greater than 50% of time in face to face counsel of the patient and in coordination of their care.   Myositis: not clear what is causing this but she is symptomatically better  IDA: continue iron but DO NOT take at the same time as her TIVICAY  HTN: continue her aldactone and her norvasc

## 2013-12-29 NOTE — Telephone Encounter (Signed)
See ultrasound results for more information. Called patient, no answer- left message that we are trying to get in touch with you with some information, nothing urgent but please call us back at the clinics and let us know if we can leave detailed information on your voicemail.

## 2013-12-29 NOTE — Telephone Encounter (Signed)
Message copied by Shelly Coss on Thu Dec 29, 2013  8:36 AM ------      Message from: Debarah Crape A      Created: Wed Dec 28, 2013  4:05 PM       Appointment is 03/05 @ 2:00            Wanda Kane ------

## 2014-01-04 ENCOUNTER — Telehealth: Payer: Self-pay | Admitting: *Deleted

## 2014-01-04 DIAGNOSIS — B20 Human immunodeficiency virus [HIV] disease: Secondary | ICD-10-CM

## 2014-01-04 DIAGNOSIS — IMO0001 Reserved for inherently not codable concepts without codable children: Secondary | ICD-10-CM

## 2014-01-04 MED ORDER — DOLUTEGRAVIR SODIUM 50 MG PO TABS: 50.0000 mg | ORAL_TABLET | Freq: Every day | ORAL | Status: DC

## 2014-01-04 MED ORDER — LAMIVUDINE 300 MG PO TABS: 300.0000 mg | ORAL_TABLET | Freq: Every day | ORAL | Status: DC

## 2014-01-04 MED ORDER — RILPIVIRINE HCL 25 MG PO TABS: 25.0000 mg | ORAL_TABLET | Freq: Every day | ORAL | Status: DC

## 2014-01-04 NOTE — Telephone Encounter (Signed)
ADAP application approved

## 2014-01-11 ENCOUNTER — Encounter: Payer: Self-pay | Admitting: Obstetrics & Gynecology

## 2014-01-11 DIAGNOSIS — B372 Candidiasis of skin and nail: Secondary | ICD-10-CM

## 2014-01-18 ENCOUNTER — Telehealth: Payer: Self-pay | Admitting: *Deleted

## 2014-01-18 ENCOUNTER — Other Ambulatory Visit: Payer: Self-pay | Admitting: General Practice

## 2014-01-18 DIAGNOSIS — B372 Candidiasis of skin and nail: Secondary | ICD-10-CM

## 2014-01-18 MED ORDER — NYSTATIN 100000 UNIT/GM EX POWD: Freq: Three times a day (TID) | CUTANEOUS | Status: DC

## 2014-01-18 MED ORDER — NYSTATIN 100000 UNIT/GM EX CREA
1.0000 | TOPICAL_CREAM | Freq: Two times a day (BID) | CUTANEOUS | Status: DC
Start: 2014-01-18 — End: 2014-05-16

## 2014-01-18 MED ORDER — NYSTATIN 100000 UNIT/GM EX POWD: Freq: Four times a day (QID) | CUTANEOUS | Status: DC

## 2014-01-18 NOTE — Telephone Encounter (Signed)
Wanda Kane called and left a message stating she is having a biopsy tomorrow at 2 pm and wants to know if she should take something for pain before she comes because she has read online some people say it is very painful and to take something before hand while others say is ok.  I called her back and informed her she can take ibuprofen 600mg  one hour before her appointment unless it is contraindicated for her to take ibuprofen.  We discussed everyone has a different pain tolerance and some people need motrin after the biopsy and some don't.  [name] state she can take ibuprofen but her doctors don't want her taking much because of possible liver toxicity and she only takes it maybe once a year.  We discussed if she takes it to only take 400mg  or she can wait and see if she needs it after the biopsy.  [name] states she feels much better now and will let us know when she comes if she took it beforehand or not.

## 2014-01-19 ENCOUNTER — Other Ambulatory Visit (HOSPITAL_COMMUNITY)
Admission: RE | Admit: 2014-01-19 | Discharge: 2014-01-19 | Disposition: A | Discharge status: Home / Self Care | Payer: No Typology Code available for payment source | Source: Ambulatory Visit | Attending: Obstetrics & Gynecology | Admitting: Obstetrics & Gynecology

## 2014-01-19 ENCOUNTER — Encounter: Payer: Self-pay | Admitting: Obstetrics & Gynecology

## 2014-01-19 ENCOUNTER — Ambulatory Visit (INDEPENDENT_AMBULATORY_CARE_PROVIDER_SITE_OTHER): Payer: No Typology Code available for payment source | Admitting: Obstetrics & Gynecology

## 2014-01-19 VITALS — BP 125/75 | HR 107 | Temp 97.9°F | Ht 65.0 in | Wt 228.2 lb

## 2014-01-19 DIAGNOSIS — N949 Unspecified condition associated with female genital organs and menstrual cycle: Secondary | ICD-10-CM

## 2014-01-19 DIAGNOSIS — D259 Leiomyoma of uterus, unspecified: Secondary | ICD-10-CM

## 2014-01-19 DIAGNOSIS — N938 Other specified abnormal uterine and vaginal bleeding: Secondary | ICD-10-CM | POA: Insufficient documentation

## 2014-01-19 DIAGNOSIS — Z3202 Encounter for pregnancy test, result negative: Secondary | ICD-10-CM

## 2014-01-19 LAB — POCT PREGNANCY, URINE: PREG TEST UR: NEGATIVE

## 2014-01-23 NOTE — Progress Notes (Signed)
   CLINIC ENCOUNTER NOTE  History:  53 y.o. G1P1001 here today for endometrial biopsy for menorrhagia evaluation.  Reports having no heavy bleeding for the past two months.   The following portions of the patient's history were reviewed and updated as appropriate: allergies, current medications, past family history, past medical history, past social history, past surgical history and problem list. 06/09/13 pap smear negative negative.  Normal mammogram on 07/06/13  Review of Systems:  A comprehensive review of systems was negative.  Objective:  Physical Exam BP 125/75  Pulse 107  Temp(Src) 97.9 F (36.6 C) (Oral)  Ht 5\' 5"  (1.651 m)  Wt 228 lb 3.2 oz (103.511 kg)  BMI 37.97 kg/m2  LMP 01/17/2014 Gen: NAD Abd: Soft, nontender and nondistended Pelvic: Normal appearing external genitalia; normal appearing vaginal mucosa and cervix.  Normal discharge.    ENDOMETRIAL BIOPSY     The indications for endometrial biopsy were reviewed.   Risks of the biopsy including cramping, bleeding, infection, uterine perforation, inadequate specimen and need for additional procedures  were discussed. The patient states she understands and agrees to undergo procedure today. Consent was signed. Time out was performed. Urine HCG was negative. During the pelvic exam, the cervix was prepped with Betadine. A single-toothed tenaculum was placed on the anterior lip of the cervix to stabilize it. The 3 mm pipelle was introduced into the endometrial cavity with difficulty to a depth of 7cm, unable to pass pipelle past this point likely 2/2 fibroid obstruction.  A moderate amount of tissue was obtained and sent to pathology. The instruments were removed from the patient's vagina. Minimal bleeding from the cervix was noted. The patient tolerated the procedure well. Routine post-procedure instructions were given to the patient.     Labs and Imaging 12/22/13 TRANSABDOMINAL AND TRANSVAGINAL ULTRASOUND OF PELVIS  CLINICAL  DATA: Menorrhagia, fibroids COMPARISON: None FINDINGS: Uterus Measurements: 10.2 x 2.4 x 7.6 cm. Dominant 5.9 x 4.7 x 4.7 cm transmural fundal fibroid. Additional 2.8 x 2.6 x 3.0 cm subserosal left posterior uterine body fibroid. Endometrium Poorly visualized due to uterine fibroids. Right ovary Not visualized transabdominally or transvaginally.Left ovary Not visualized transabdominally or transvaginally. Other findings: No free fluid. IMPRESSION: Uterine fibroids, including a dominant 5.9 cm fundal fibroid, as described above. Bilateral ovaries are not discretely visualized.     Assessment & Plan:  Will follow up endometrial biopsy, worried about obtaining endometrial cells given obstruction If inadequate, may need hysteroscopy, D&C for further evaluation for her AUB   Verita Schneiders, MD, FACOG Attending [place], [place]

## 2014-01-23 NOTE — Patient Instructions (Signed)
Return to clinic for any scheduled appointments or for any gynecologic concerns as needed.   

## 2014-01-24 ENCOUNTER — Telehealth: Payer: Self-pay | Admitting: *Deleted

## 2014-01-24 NOTE — Telephone Encounter (Signed)
Message copied by Sue Lush on Tue Jan 24, 2014  4:29 PM ------      Message from: Verita Schneiders A      Created: Mon Jan 23, 2014  3:40 PM       Benign endocervical cells on biopsy, no endometrial cells due to abundant blood.  Patient should be offered repeat biopsy or return for discussion of possible hysteroscopy, D&C.  If no further irregular bleeding, may offer expectant management for now.  Please call to inform patient of results. ------

## 2014-01-24 NOTE — Telephone Encounter (Addendum)
Left message on pt's machine to call the clinic, and to let us know if we could leave result information on her voicemail.  3/11  0825 - pt left message on nurse voice mail last night stating that a detailed message may be left on her voice mail. I called her back this morning and left message which included test results and possible plan of care options as stated in Dr. Arther Abbott previous note. I asked pt to call back and let us know what she has decided so that we may forward the information to Dr. Harolyn Rutherford.  Diane Day RNC

## 2014-01-25 ENCOUNTER — Encounter: Payer: Self-pay | Admitting: *Deleted

## 2014-01-25 NOTE — Telephone Encounter (Signed)
Patient called and left message that she has a secondary biopsy appt scheduled for 4/6 and does not want a resident to do the procedure but would prefer dr anyanwu do it. Called patient and she is aware of the insufficient sampling and that we will be trying again 4/6, told patient it isn't a problem for her not to have a resident present and that dr anyanwu can most definitely be the one doing the procedure for her and that I would make a note in her appt information of this as well. Patient was satisfied, verbalized understanding to all and had no further questions

## 2014-01-30 ENCOUNTER — Encounter: Payer: Self-pay | Admitting: Obstetrics & Gynecology

## 2014-01-31 ENCOUNTER — Encounter: Payer: Self-pay | Admitting: Infectious Disease

## 2014-02-11 ENCOUNTER — Other Ambulatory Visit: Payer: Self-pay | Admitting: Infectious Disease

## 2014-02-20 ENCOUNTER — Ambulatory Visit: Payer: Self-pay | Admitting: Obstetrics & Gynecology

## 2014-05-15 ENCOUNTER — Other Ambulatory Visit: Payer: Self-pay

## 2014-05-15 ENCOUNTER — Telehealth: Payer: Self-pay | Admitting: *Deleted

## 2014-05-15 NOTE — Telephone Encounter (Signed)
"  Kidney infection?" middle back pain radiating lower.  No blood in urine.  Discomfort improves after voiding.  Pt has increased fluid intake.  Unable to access MyChart, needing new log on.  Scheduled MD appt to evaluate.

## 2014-05-16 ENCOUNTER — Ambulatory Visit (INDEPENDENT_AMBULATORY_CARE_PROVIDER_SITE_OTHER): Payer: Self-pay | Admitting: Internal Medicine

## 2014-05-16 ENCOUNTER — Encounter: Payer: Self-pay | Admitting: Internal Medicine

## 2014-05-16 VITALS — BP 128/83 | HR 96 | Temp 98.2°F | Wt 229.0 lb

## 2014-05-16 DIAGNOSIS — M545 Low back pain, unspecified: Secondary | ICD-10-CM

## 2014-05-16 DIAGNOSIS — M549 Dorsalgia, unspecified: Secondary | ICD-10-CM | POA: Insufficient documentation

## 2014-05-16 NOTE — Progress Notes (Signed)
Patient ID: Wanda Kane, female   DOB: May 17, 1961, 53 y.o.   MRN: [id number]          Patient Active Problem List   Diagnosis Date Noted  . Back pain 05/16/2014  . Fibroid   . Anemia 10/12/2013  . Myalgia and myositis 02/09/2013  . Condyloma acuminatum 10/27/2012  . Acquired acanthosis nigricans 10/27/2012  . Uterine fibroid 10/27/2012  . 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 HIV (human immunodeficiency virus infection) 10/18/2012  . HTN (hypertension) 10/18/2012  . Dyslipidemia 10/18/2012  . Pre-diabetes 10/18/2012    Patient's Medications  New Prescriptions   No medications on file  Previous Medications   AMLODIPINE (NORVASC) 10 MG TABLET    TAKE 1 TABLET BY MOUTH EVERY DAY   DOCUSATE SODIUM (COLACE) 100 MG CAPSULE    Take 1 capsule (100 mg total) by mouth 2 (two) times daily as needed.   DOLUTEGRAVIR (TIVICAY) 50 MG TABLET    Take 1 tablet (50 mg total) by mouth daily.   FERROUS SULFATE 325 (65 FE) MG EC TABLET    Take 1 tablet (325 mg total) by mouth 2 (two) times daily before a meal.   LAMIVUDINE (EPIVIR) 300 MG TABLET    Take 1 tablet (300 mg total) by mouth daily.   LISINOPRIL (PRINIVIL,ZESTRIL) 10 MG TABLET    TAKE 1 TABLET BY MOUTH EVERY DAY   RILPIVIRINE (EDURANT) 25 MG TABS TABLET    Take 1 tablet (25 mg total) by mouth daily with breakfast.   SPIRONOLACTONE (ALDACTONE) 100 MG TABLET    Take 1 tablet (100 mg total) by mouth daily.  Modified Medications   No medications on file  Discontinued Medications   MEGESTROL (MEGACE) 40 MG TABLET    Take 1 tablet (40 mg total) by mouth 2 (two) times daily.   NYSTATIN (MYCOSTATIN) POWDER    Apply topically 3 (three) times daily. Apply under breast for rash as needed   NYSTATIN CREAM (MYCOSTATIN)    Apply 1 application topically 2 (two) times daily. Apply onto stomach for rash as needed    Subjective: Vannah is seen on a work in basis. She developed some transient right upper quadrant pain last week that was  followed by onset of right-sided back pain. She did not have any prior injury. She noticed that her lower back was warm to the touch but did not have any rash. The pain moved up her back and improved when she urinated. She has not had any dysuria, hematuria or other urinary symptoms. She's not had any fever or chills. Has not had any cough or shortness of breath. She's not had any nausea or vomiting. She has not missed any doses of her medications. Review of Systems: Pertinent items are noted in HPI.  Past Medical History  Diagnosis Date  . HIV infection   . Hypertension   . Fibroids   . Fibroid     History  Substance Use Topics  . Smoking status: Never Smoker   . Smokeless tobacco: Never Used  . Alcohol Use: No    Family History  Problem Relation Age of Onset  . Hypertension Mother   . CAD Brother   . Heart disease Brother 80    cardiac arrest    Allergies  Allergen Reactions  . Viread [Tenofovir]     Rhabdo    Objective: Temp: 98.2 F (36.8 C) (06/30 1010) Temp src: Oral (06/30 1010) BP: 128/83 mmHg (06/30 1010) Pulse Rate: 96 (06/30 1010)  Body mass index is 38.11 kg/(m^2).  General: She is in no distress Skin: No rash Lungs: Clear Cor: Regular S1 and S2 no murmurs Abdomen: Soft nontender No CVA tenderness  Lab Results Lab Results  Component Value Date   WBC 6.6 12/12/2013   HGB 11.9* 12/12/2013   HCT 36.2 12/12/2013   MCV 85.8 12/12/2013   PLT 321 12/12/2013    Lab Results  Component Value Date   CREATININE 0.96 12/12/2013   BUN 9 12/12/2013   NA 137 12/12/2013   K 4.3 12/12/2013   CL 102 12/12/2013   CO2 25 12/12/2013    Lab Results  Component Value Date   ALT 16 12/12/2013   AST 23 12/12/2013   ALKPHOS 62 12/12/2013   BILITOT 0.4 12/12/2013    Lab Results  Component Value Date   CHOL 214* 09/14/2013   HDL 53 09/14/2013   LDLCALC 133* 09/14/2013   TRIG 139 09/14/2013   CHOLHDL 4.0 09/14/2013    Lab Results HIV 1 RNA Quant (copies/mL)  Date  Value  12/12/2013 <20   09/14/2013 <20   06/01/2013 <20      CD4 T Cell Abs (/uL)  Date Value  12/12/2013 510   09/14/2013 620   06/01/2013 710      Assessment: I'm not sure what is causing her back pain. It is interesting that it improves with urination. Although this is not typical of a urinary tract infection I will check a UA and culture just in case. She may have simple low back strain and I told her that it is certainly fine for her to take acetaminophen.  She will change the timing of her iron to 6 PM and bedtime so that it will not interfere with her Tivicay.  Plan: 1. Continue current antiretroviral regimen 2. Acetaminophen as needed 3. UA and urine culture 4. She has an upcoming followup visit with Dr. Tommy Medal   Michel Bickers, [place] [facility] [place] [phone] pager   [phone] cell 05/16/2014, 10:26 AM

## 2014-05-16 NOTE — Progress Notes (Signed)
Patient ID: Wanda Kane, female   DOB: 1961-08-09, 53 y.o.   MRN: [id number] HPI: [name] is a 53 y.o. female who is here for her routine visit.  Allergies: Allergies  Allergen Reactions  . Viread [Tenofovir]     Rhabdo    Vitals: Temp: 98.2 F (36.8 C) (06/30 1010) Temp src: Oral (06/30 1010) BP: 128/83 mmHg (06/30 1010) Pulse Rate: 96 (06/30 1010)  Past Medical History: Past Medical History  Diagnosis Date  . HIV infection   . Hypertension   . Fibroids   . Fibroid     Social History: History   Social History  . Marital Status: Single    Spouse Name: N/A    Number of Children: N/A  . Years of Education: N/A   Social History Main Topics  . Smoking status: Never Smoker   . Smokeless tobacco: Never Used  . Alcohol Use: No  . Drug Use: No  . Sexual Activity: Not Currently    Partners: Male     Comment: patient declined condoms   Other Topics Concern  . None   Social History Narrative  . None    Previous Regimen:   Current Regimen: DTG + 3TC + RPV  Labs: HIV 1 RNA Quant (copies/mL)  Date Value  12/12/2013 <20   09/14/2013 <20   06/01/2013 <20      CD4 T Cell Abs (/uL)  Date Value  12/12/2013 510   09/14/2013 620   06/01/2013 710      Hep B S Ab (no units)  Date Value  10/21/2012 NONREACTIVE      Hepatitis B Surface Ag (no units)  Date Value  10/21/2012 NEGATIVE      HCV Ab (no units)  Date Value  10/21/2012 NEGATIVE     CrCl: The CrCl is unknown because both a height and weight (above a minimum accepted value) are required for this calculation.  Lipids:    Component Value Date/Time   CHOL 214* 09/14/2013 1020   TRIG 139 09/14/2013 1020   HDL 53 09/14/2013 1020   CHOLHDL 4.0 09/14/2013 1020   VLDL 28 09/14/2013 1020   LDLCALC 133* 09/14/2013 1020    Assessment: Pt is doing well on her current regimen. There were some confusion about her regimen and potential interactions with the iron supplement that she is taking. It's  correct that it could really interact with dolutegravir. She is currently on BID iron so administration times could be an issue. Told her she could take all of her HIV meds in AM then start the BID iron at 1800 and HS. She is totally ok with it.   Recommendations:  Cont current regimen in AM Start taking iron suppplements at 1800 and HS  Wilfred Lacy, PharmD Clinical Infectious [facility] 05/16/2014, 11:01 AM

## 2014-05-17 LAB — URINALYSIS, ROUTINE W REFLEX MICROSCOPIC
BILIRUBIN URINE: NEGATIVE
GLUCOSE, UA: NEGATIVE mg/dL
Hgb urine dipstick: NEGATIVE
Ketones, ur: NEGATIVE mg/dL
Leukocytes, UA: NEGATIVE
Nitrite: NEGATIVE
PH: 6.5 (ref 5.0–8.0)
PROTEIN: NEGATIVE mg/dL
SPECIFIC GRAVITY, URINE: 1.016 (ref 1.005–1.030)
Urobilinogen, UA: 0.2 mg/dL (ref 0.0–1.0)

## 2014-05-17 LAB — URINE CULTURE
COLONY COUNT: NO GROWTH
ORGANISM ID, BACTERIA: NO GROWTH

## 2014-05-18 ENCOUNTER — Telehealth: Payer: Self-pay | Admitting: *Deleted

## 2014-05-18 NOTE — Telephone Encounter (Signed)
Per Dr Megan Salon all urine tests were normal.

## 2014-05-22 ENCOUNTER — Other Ambulatory Visit: Payer: No Typology Code available for payment source

## 2014-05-22 ENCOUNTER — Ambulatory Visit: Payer: Self-pay

## 2014-05-22 DIAGNOSIS — B20 Human immunodeficiency virus [HIV] disease: Secondary | ICD-10-CM

## 2014-05-22 LAB — CBC WITH DIFFERENTIAL/PLATELET
BASOS ABS: 0.1 10*3/uL (ref 0.0–0.1)
BASOS PCT: 1 % (ref 0–1)
Eosinophils Absolute: 0.1 10*3/uL (ref 0.0–0.7)
Eosinophils Relative: 2 % (ref 0–5)
HCT: 33.4 % — ABNORMAL LOW (ref 36.0–46.0)
Hemoglobin: 11.6 g/dL — ABNORMAL LOW (ref 12.0–15.0)
Lymphocytes Relative: 41 % (ref 12–46)
Lymphs Abs: 2.8 10*3/uL (ref 0.7–4.0)
MCH: 29.1 pg (ref 26.0–34.0)
MCHC: 34.7 g/dL (ref 30.0–36.0)
MCV: 83.9 fL (ref 78.0–100.0)
Monocytes Absolute: 0.5 10*3/uL (ref 0.1–1.0)
Monocytes Relative: 7 % (ref 3–12)
NEUTROS ABS: 3.4 10*3/uL (ref 1.7–7.7)
Neutrophils Relative %: 49 % (ref 43–77)
PLATELETS: 294 10*3/uL (ref 150–400)
RBC: 3.98 MIL/uL (ref 3.87–5.11)
RDW: 13.7 % (ref 11.5–15.5)
WBC: 6.9 10*3/uL (ref 4.0–10.5)

## 2014-05-22 LAB — COMPLETE METABOLIC PANEL WITH GFR
ALK PHOS: 66 U/L (ref 39–117)
ALT: 14 U/L (ref 0–35)
AST: 20 U/L (ref 0–37)
Albumin: 4.3 g/dL (ref 3.5–5.2)
BUN: 9 mg/dL (ref 6–23)
CALCIUM: 8.8 mg/dL (ref 8.4–10.5)
CO2: 22 mEq/L (ref 19–32)
Chloride: 103 mEq/L (ref 96–112)
Creat: 0.81 mg/dL (ref 0.50–1.10)
GFR, Est African American: >89 mL/min
GFR, Est Non African American: 83 mL/min
Glucose, Bld: 83 mg/dL (ref 70–99)
Potassium: 3.8 mEq/L (ref 3.5–5.3)
Sodium: 134 mEq/L — ABNORMAL LOW (ref 135–145)
Total Bilirubin: 0.4 mg/dL (ref 0.2–1.2)
Total Protein: 7.2 g/dL (ref 6.0–8.3)

## 2014-05-22 NOTE — Addendum Note (Signed)
Addended by: Dolan Amen D on: 05/22/2014 11:35 AM   Modules accepted: Orders

## 2014-05-23 LAB — T-HELPER CELL (CD4) - (RCID CLINIC ONLY)
CD4 T CELL HELPER: 17 % — AB (ref 33–55)
CD4 T Cell Abs: 550 /uL (ref 400–2700)

## 2014-05-24 LAB — HIV-1 RNA QUANT-NO REFLEX-BLD

## 2014-05-29 ENCOUNTER — Ambulatory Visit: Payer: Self-pay | Admitting: Infectious Disease

## 2014-05-29 ENCOUNTER — Encounter: Payer: Self-pay | Admitting: Infectious Disease

## 2014-05-29 ENCOUNTER — Other Ambulatory Visit: Payer: Self-pay | Admitting: *Deleted

## 2014-05-29 DIAGNOSIS — I10 Essential (primary) hypertension: Secondary | ICD-10-CM

## 2014-05-29 MED ORDER — SPIRONOLACTONE 100 MG PO TABS: 100.0000 mg | ORAL_TABLET | Freq: Every day | ORAL | Status: DC

## 2014-05-30 ENCOUNTER — Other Ambulatory Visit: Payer: Self-pay | Admitting: Obstetrics & Gynecology

## 2014-06-05 ENCOUNTER — Encounter: Payer: Self-pay | Admitting: Infectious Disease

## 2014-06-05 ENCOUNTER — Ambulatory Visit (INDEPENDENT_AMBULATORY_CARE_PROVIDER_SITE_OTHER): Payer: Self-pay | Admitting: Infectious Disease

## 2014-06-05 VITALS — BP 137/83 | HR 80 | Temp 98.4°F | Wt 228.5 lb

## 2014-06-05 DIAGNOSIS — B20 Human immunodeficiency virus [HIV] disease: Secondary | ICD-10-CM

## 2014-06-05 DIAGNOSIS — IMO0001 Reserved for inherently not codable concepts without codable children: Secondary | ICD-10-CM

## 2014-06-05 DIAGNOSIS — I10 Essential (primary) hypertension: Secondary | ICD-10-CM

## 2014-06-05 DIAGNOSIS — Z21 Asymptomatic human immunodeficiency virus [HIV] infection status: Secondary | ICD-10-CM

## 2014-06-05 NOTE — Patient Instructions (Signed)
I would consider change to [place] all in one ARV regimen in place of current regimen it contains 2/3 of your current meds  I would also consider increasing your lisinopril and dropping your aldactone to get on meds covered by ADAP  Make sure you renew your ADAP, ASAP!

## 2014-06-05 NOTE — Progress Notes (Signed)
Subjective:    Patient ID: Wanda Kane, female    DOB: Mar 27, 1961, 53 y.o.   MRN: [id number]  HPI   Ms Tugwell is a 53 year old African American lady with HIV previous he cared for [facility] and then moved to [place].   While in [place] she apparently was on various antiretroviral regimens lastly on [place] and complera.   She moved back in July was seen by Dr. Baxter Flattery in February. Her regimen was simplified to STRIBILD. She had tolerated this regimen without to much complaint other some nausea and loose stools.   However week prior to her seeing me in March 2014 she had  developed increasingly severe diffuse myalgias in her neck arms legs. She had similar symptoms in the past while out on antiretrovirals. She could not initially recall which antiretrovirals at apparently been blamed for the symptoms. However when I examined her allergy history he or she is listed as being allergic to tenofovir with alleged   tenofovir-induced rhabdomyolysis.  I  therefore crafted a new ARV regimen for her without TNF, namely once daily Tivicay, Edurant and Epivir. I  checked her for elevated CPK  Which was at 237. Since change to new TNF free regimen she had down titration of her CK to normal but was now  back above 200 again.   She has not been seen by Rheum due to lack of insurance.  I offered further Rheum workup with labs was unrevealing   Since I last saw her she states she is no longer having any problems a myalgias she is going through menopause at present. We discussed the location of her antiretroviral regimen to [facility] but she would like to remain on her current regimen.  She is perfectly well controlled and with healthy CD4  Lab Results  Component Value Date   HIV1RNAQUANT <20 05/22/2014   Lab Results  Component Value Date   CD4TABS 550 05/22/2014   CD4TABS 510 12/12/2013   CD4TABS 620 09/14/2013       Review of Systems  Constitutional: Negative for fever, chills,  diaphoresis, activity change, appetite change, fatigue and unexpected weight change.  HENT: Negative for congestion, rhinorrhea, sinus pressure, sneezing, sore throat and trouble swallowing.   Eyes: Negative for photophobia and visual disturbance.  Respiratory: Negative for cough, chest tightness, shortness of breath, wheezing and stridor.   Cardiovascular: Negative for chest pain, palpitations and leg swelling.  Gastrointestinal: Negative for nausea, vomiting, abdominal pain, diarrhea, constipation, blood in stool, abdominal distention and anal bleeding.  Genitourinary: Negative for dysuria, hematuria, flank pain and difficulty urinating.  Musculoskeletal: Negative for arthralgias, back pain, gait problem, joint swelling, myalgias and neck stiffness.  Skin: Negative for color change, pallor, rash and wound.  Neurological: Negative for dizziness, tremors, weakness and light-headedness.  Hematological: Negative for adenopathy. Does not bruise/bleed easily.  Psychiatric/Behavioral: Negative for behavioral problems, confusion, sleep disturbance, dysphoric mood, decreased concentration and agitation.       Objective:   Physical Exam  Constitutional: She is oriented to person, place, and time. She appears well-developed and well-nourished. No distress.  HENT:  Head: Normocephalic and atraumatic.  Mouth/Throat: Oropharynx is clear and moist. No oropharyngeal exudate.  Eyes: Conjunctivae and EOM are normal. Pupils are equal, round, and reactive to light. No scleral icterus.  Neck: Normal range of motion. Neck supple. No JVD present.  Cardiovascular: Normal rate, regular rhythm and normal heart sounds.  Exam reveals no gallop and no friction rub.   No murmur heard. Pulmonary/Chest:  Effort normal and breath sounds normal. No respiratory distress. She has no wheezes. She has no rales. She exhibits no tenderness.  Abdominal: She exhibits no distension and no mass. There is no tenderness. There is no  rebound and no guarding.  Musculoskeletal: She exhibits no edema and no tenderness.  Lymphadenopathy:    She has no cervical adenopathy.  Neurological: She is alert and oriented to person, place, and time. She exhibits normal muscle tone. Coordination normal.  Skin: Skin is warm and dry. She is not diaphoretic. No erythema. No pallor.  Psychiatric: She has a normal mood and affect. Her behavior is normal. Judgment and thought content normal.          Assessment & Plan:   HIV: continue current regimen. She will consider TRIUMEQ She needs to renew ADAP. She may have to move back to [facility]  I spent greater than 25 minutes with the patient including greater than 50% of time in face to face counsel of the patient and in coordination of their care.   Myositis: not clear what is causing this but she is symptomatically better  HTN: she is on ACEI, norvasc and aldactone *(latter not covered by ADAP) I suggested go up on ACEI and drop aldactone and then add in HCTZ if needed

## 2014-06-14 ENCOUNTER — Telehealth: Payer: Self-pay | Admitting: *Deleted

## 2014-06-14 NOTE — Telephone Encounter (Signed)
Patient called stating she has noticed some changes in her vision and wanted to be referred to optometry. She does have a [employer] discount card but we do no have office notes documenting her vision problems. Referred to [facility] for primary care to be evaluated. [name]

## 2014-06-16 ENCOUNTER — Telehealth: Payer: Self-pay | Admitting: Internal Medicine

## 2014-06-16 NOTE — Telephone Encounter (Signed)
Left voicemail for patient to call to schedule appointment to establish care.

## 2014-06-19 ENCOUNTER — Other Ambulatory Visit: Payer: Self-pay | Admitting: *Deleted

## 2014-06-19 DIAGNOSIS — B20 Human immunodeficiency virus [HIV] disease: Secondary | ICD-10-CM

## 2014-06-19 DIAGNOSIS — IMO0001 Reserved for inherently not codable concepts without codable children: Secondary | ICD-10-CM

## 2014-06-19 MED ORDER — LAMIVUDINE 300 MG PO TABS: 300.0000 mg | ORAL_TABLET | Freq: Every day | ORAL | Status: DC

## 2014-06-19 MED ORDER — DOLUTEGRAVIR SODIUM 50 MG PO TABS: 50.0000 mg | ORAL_TABLET | Freq: Every day | ORAL | Status: DC

## 2014-06-19 MED ORDER — RILPIVIRINE HCL 25 MG PO TABS: 25.0000 mg | ORAL_TABLET | Freq: Every day | ORAL | Status: DC

## 2014-06-20 ENCOUNTER — Ambulatory Visit: Payer: No Typology Code available for payment source

## 2014-07-05 ENCOUNTER — Other Ambulatory Visit: Payer: Self-pay | Admitting: Obstetrics & Gynecology

## 2014-07-05 DIAGNOSIS — Z1231 Encounter for screening mammogram for malignant neoplasm of breast: Secondary | ICD-10-CM

## 2014-07-06 ENCOUNTER — Ambulatory Visit (HOSPITAL_COMMUNITY)
Admission: RE | Admit: 2014-07-06 | Discharge: 2014-07-06 | Disposition: A | Discharge status: Home / Self Care | Payer: No Typology Code available for payment source | Source: Ambulatory Visit | Attending: Obstetrics & Gynecology | Admitting: Obstetrics & Gynecology

## 2014-07-06 DIAGNOSIS — Z1231 Encounter for screening mammogram for malignant neoplasm of breast: Secondary | ICD-10-CM

## 2014-07-13 ENCOUNTER — Encounter: Payer: Self-pay | Admitting: Family Medicine

## 2014-07-13 ENCOUNTER — Ambulatory Visit (INDEPENDENT_AMBULATORY_CARE_PROVIDER_SITE_OTHER): Payer: No Typology Code available for payment source | Admitting: Family Medicine

## 2014-07-13 VITALS — BP 130/75 | HR 80 | Temp 98.4°F | Resp 20 | Ht 65.0 in | Wt 225.0 lb

## 2014-07-13 DIAGNOSIS — R7309 Other abnormal glucose: Secondary | ICD-10-CM

## 2014-07-13 DIAGNOSIS — B20 Human immunodeficiency virus [HIV] disease: Secondary | ICD-10-CM

## 2014-07-13 DIAGNOSIS — N951 Menopausal and female climacteric states: Secondary | ICD-10-CM

## 2014-07-13 DIAGNOSIS — Z23 Encounter for immunization: Secondary | ICD-10-CM

## 2014-07-13 DIAGNOSIS — E785 Hyperlipidemia, unspecified: Secondary | ICD-10-CM

## 2014-07-13 DIAGNOSIS — I10 Essential (primary) hypertension: Secondary | ICD-10-CM

## 2014-07-13 DIAGNOSIS — Z21 Asymptomatic human immunodeficiency virus [HIV] infection status: Secondary | ICD-10-CM

## 2014-07-13 DIAGNOSIS — R7303 Prediabetes: Secondary | ICD-10-CM

## 2014-07-13 DIAGNOSIS — Z1211 Encounter for screening for malignant neoplasm of colon: Secondary | ICD-10-CM

## 2014-07-13 MED ORDER — CALCIUM CARBONATE-VITAMIN D 600-400 MG-UNIT PO TABS: 1.0000 | ORAL_TABLET | Freq: Every day | ORAL | Status: DC

## 2014-07-13 NOTE — Progress Notes (Signed)
Subjective:    Patient ID: Wanda Kane, female    DOB: 03/25/1961, 53 y.o.   MRN: [id number]  HPI  Subjective:  Wanda Kane is a 53 y.o. female that presents to establish care. She states that she has been followed by Dr. Tommy Medal at [facility] previously for HIV   Wanda Kane is here for follow-up of HIV infection. Patient states that she is taking medications consistently. She reports that her last follow-up with Dr. Tommy Medal was 1 month ago. She denies night sweats, headache, fatigue, numbness and tingling to upper extremities.    Patient also here for a history of hypertension She is exercising and is adherent to low salt diet.  Blood pressure is well controlled at home.. Patient denies chest pain, claudication, dyspnea, fatigue, irregular heart beat, lower extremity edema, orthopnea, palpitations, syncope and tachypnea.    Patient presents with hyperlipidemia.  .  Her last labs showed Total cholesterol of 214 and LDL of 133. She is currently not taking medication for hyperlipidemia. She has been exercising and following a low fat diet. There is a family history of hyperlipidemia. There is not a family history of early ischemia heart disease.   Past Medical History  Diagnosis Date  . HIV infection   . Hypertension   . Fibroids   . Fibroid     Review of Systems  Constitutional: Positive for fatigue (occasionally) and unexpected weight change (weight gain). Negative for fever.  HENT: Negative.   Eyes: Negative.   Respiratory: Negative.   Cardiovascular: Negative.   Gastrointestinal: Negative.  Negative for abdominal distention.  Endocrine: Positive for polyuria.  Genitourinary: Negative.   Musculoskeletal: Negative.   Allergic/Immunologic: Negative.   Neurological: Negative.  Negative for weakness and numbness.  Hematological: Negative.   Psychiatric/Behavioral: Negative.        Objective:   Physical Exam  Vitals reviewed. Constitutional:  She is oriented to person, place, and time. She appears well-developed and well-nourished. She is active.  HENT:  Head: Normocephalic and atraumatic.  Right Ear: Hearing, tympanic membrane, external ear and ear canal normal.  Left Ear: Hearing, tympanic membrane, external ear and ear canal normal.  Nose: Nose normal.  Mouth/Throat: Uvula is midline and oropharynx is clear and moist. She has dentures.  Eyes: Conjunctivae and lids are normal. Pupils are equal, round, and reactive to light.  Neck: Trachea normal, normal range of motion and full passive range of motion without pain. Neck supple.  Cardiovascular: Normal rate, regular rhythm, normal heart sounds and normal pulses.   Pulmonary/Chest: Effort normal and breath sounds normal.  Abdominal: Soft. Normal appearance.  Musculoskeletal: Normal range of motion.  Lymphadenopathy:       Head (right side): No submental and no submandibular adenopathy present.       Head (left side): No submental and no submandibular adenopathy present.    She has no cervical adenopathy.  Neurological: She is alert and oriented to person, place, and time. She has normal strength and normal reflexes. No cranial nerve deficit or sensory deficit. She displays a negative Romberg sign.  Skin: Skin is intact. No cyanosis. Nails show no clubbing.     Psychiatric: She has a normal mood and affect. Her speech is normal and behavior is normal. Judgment and thought content normal. Cognition and memory are normal.      BP 130/75  Pulse 80  Temp(Src) 98.4 F (36.9 C) (Oral)  Resp 20  Ht 5\' 5"  (1.651 m)  Wt 225 lb (102.059 kg)  BMI 37.44 kg/m2  SpO2 99%  LMP 05/22/2014    Assessment & Plan:    1. Peri-menopausal Given written information on perimenopausal symptoms. She states that she has occasional hot flashes that are manageable with out medication interventions.  - Calcium Carbonate-Vitamin D (CALTRATE 600+D) 600-400 MG-UNIT per tablet; Take 1 tablet by  mouth daily.  Dispense: 30 tablet; Refill: 1  2. Essential hypertension She takes prescribed medications consistently. She is also following a balanced diet and exercises 3 days per week.  - Lipid Panel; Future - COMPLETE METABOLIC PANEL WITH GFR  3. HIV (human immunodeficiency virus infection) Stable. She is to follow up with [facility] as scheduled. She also states that she has an appointment scheduled in October  4. Dyslipidemia -Lipid panel; future Stable; she is to continue diet and exercise regimen.   5. Pre-diabetes  - Hemoglobin A1c; Future - Lipid Panel; Future - COMPLETE METABOLIC PANEL WITH GFR  6. Need for Tdap vaccination - Tdap vaccine greater than or equal to 7yo IM  7. Immunization due  - Tdap vaccine greater than or equal to 7yo IM - Flu Vaccine QUAD 36+ mos PF IM (Fluarix Quad PF)  8. Encounter for screening colonoscopy - Ambulatory referral to Gastroenterology  Pap Smear: She reports that she is followed by Dr. Harolyn Rutherford and [facility] for fibroids. Last visit was in March. She reports that she is up to date RTC: 3 months for HTN with Dr. Zigmund Daniel Scheduled to have labs  in 2 months.

## 2014-07-13 NOTE — Patient Instructions (Signed)
Perimenopause Perimenopause is the time when your body begins to move into the menopause (no menstrual period for 12 straight months). It is a natural process. Perimenopause can begin 2-8 years before the menopause and usually lasts for 1 year after the menopause. During this time, your ovaries may or may not produce an egg. The ovaries vary in their production of estrogen and progesterone hormones each month. This can cause irregular menstrual periods, difficulty getting pregnant, vaginal bleeding between periods, and uncomfortable symptoms. CAUSES  Irregular production of the ovarian hormones, estrogen and progesterone, and not ovulating every month.  Other causes include:  Tumor of the pituitary gland in the brain.  Medical disease that affects the ovaries.  Radiation treatment.  Chemotherapy.  Unknown causes.  Heavy smoking and excessive alcohol intake can bring on perimenopause sooner. SIGNS AND SYMPTOMS   Hot flashes.  Night sweats.  Irregular menstrual periods.  Decreased sex drive.  Vaginal dryness.  Headaches.  Mood swings.  Depression.  Memory problems.  Irritability.  Tiredness.  Weight gain.  Trouble getting pregnant.  The beginning of losing bone cells (osteoporosis).  The beginning of hardening of the arteries (atherosclerosis). DIAGNOSIS  Your health care provider will make a diagnosis by analyzing your age, menstrual history, and symptoms. He or she will do a physical exam and note any changes in your body, especially your female organs. Female hormone tests may or may not be helpful depending on the amount of female hormones you produce and when you produce them. However, other hormone tests may be helpful to rule out other problems. TREATMENT  In some cases, no treatment is needed. The decision on whether treatment is necessary during the perimenopause should be made by you and your health care provider based on how the symptoms are affecting you  and your lifestyle. Various treatments are available, such as:  Treating individual symptoms with a specific medicine for that symptom.  Herbal medicines that can help specific symptoms.  Counseling.  Group therapy. HOME CARE INSTRUCTIONS   Keep track of your menstrual periods (when they occur, how heavy they are, how long between periods, and how long they last) as well as your symptoms and when they started.  Only take over-the-counter or prescription medicines as directed by your health care provider.  Sleep and rest.  Exercise.  Eat a diet that contains calcium (good for your bones) and soy (acts like the estrogen hormone).  Do not smoke.  Avoid alcoholic beverages.  Take vitamin supplements as recommended by your health care provider. Taking vitamin E may help in certain cases.  Take calcium and vitamin D supplements to help prevent bone loss.  Group therapy is sometimes helpful.  Acupuncture may help in some cases. SEEK MEDICAL CARE IF:   You have questions about any symptoms you are having.  You need a referral to a specialist (gynecologist, psychiatrist, or psychologist). SEEK IMMEDIATE MEDICAL CARE IF:   You have vaginal bleeding.  Your period lasts longer than 8 days.  Your periods are recurring sooner than 21 days.  You have bleeding after intercourse.  You have severe depression.  You have pain when you urinate.  You have severe headaches.  You have vision problems. Document Released: 12/11/2004 Document Revised: 08/24/2013 Document Reviewed: 06/02/2013 [facility] Patient Information 2015 [place], [place]. This information is not intended to replace advice given to you by your health care provider. Make sure you discuss any questions you have with your health care provider. DASH Eating Plan DASH stands  for "Dietary Approaches to Stop Hypertension." The DASH eating plan is a healthy eating plan that has been shown to reduce high blood pressure  (hypertension). Additional health benefits may include reducing the risk of type 2 diabetes mellitus, heart disease, and stroke. The DASH eating plan may also help with weight loss. WHAT DO I NEED TO KNOW ABOUT THE DASH EATING PLAN? For the DASH eating plan, you will follow these general guidelines:  Choose foods with a percent daily value for sodium of less than 5% (as listed on the food label).  Use salt-free seasonings or herbs instead of table salt or sea salt.  Check with your health care provider or pharmacist before using salt substitutes.  Eat lower-sodium products, often labeled as "lower sodium" or "no salt added."  Eat fresh foods.  Eat more vegetables, fruits, and low-fat dairy products.  Choose whole grains. Look for the word "whole" as the first word in the ingredient list.  Choose fish and skinless chicken or [place] more often than red meat. Limit fish, poultry, and meat to 6 oz (170 g) each day.  Limit sweets, desserts, sugars, and sugary drinks.  Choose heart-healthy fats.  Limit cheese to 1 oz (28 g) per day.  Eat more home-cooked food and less restaurant, buffet, and fast food.  Limit fried foods.  Cook foods using methods other than frying.  Limit canned vegetables. If you do use them, rinse them well to decrease the sodium.  When eating at a restaurant, ask that your food be prepared with less salt, or no salt if possible. WHAT FOODS CAN I EAT? Seek help from a dietitian for individual calorie needs. Grains Whole grain or whole wheat bread. Brown rice. Whole grain or whole wheat pasta. Quinoa, bulgur, and whole grain cereals. Low-sodium cereals. Corn or whole wheat flour tortillas. Whole grain cornbread. Whole grain crackers. Low-sodium crackers. Vegetables Fresh or frozen vegetables (raw, steamed, roasted, or grilled). Low-sodium or reduced-sodium tomato and vegetable juices. Low-sodium or reduced-sodium tomato sauce and paste. Low-sodium or  reduced-sodium canned vegetables.  Fruits All fresh, canned (in natural juice), or frozen fruits. Meat and Other Protein Products Ground beef (85% or leaner), grass-fed beef, or beef trimmed of fat. Skinless chicken or [place]. Ground chicken or [place]. Pork trimmed of fat. All fish and seafood. Eggs. Dried beans, peas, or lentils. Unsalted nuts and seeds. Unsalted canned beans. Dairy Low-fat dairy products, such as skim or 1% milk, 2% or reduced-fat cheeses, low-fat ricotta or cottage cheese, or plain low-fat yogurt. Low-sodium or reduced-sodium cheeses. Fats and Oils Tub margarines without trans fats. Light or reduced-fat mayonnaise and salad dressings (reduced sodium). Avocado. Safflower, olive, or canola oils. Natural peanut or almond butter. Other Unsalted popcorn and pretzels. The items listed above may not be a complete list of recommended foods or beverages. Contact your dietitian for more options. WHAT FOODS ARE NOT RECOMMENDED? Grains White bread. White pasta. White rice. Refined cornbread. Bagels and croissants. Crackers that contain trans fat. Vegetables Creamed or fried vegetables. Vegetables in a cheese sauce. Regular canned vegetables. Regular canned tomato sauce and paste. Regular tomato and vegetable juices. Fruits Dried fruits. Canned fruit in light or heavy syrup. Fruit juice. Meat and Other Protein Products Fatty cuts of meat. Ribs, chicken wings, bacon, sausage, bologna, salami, chitterlings, fatback, hot dogs, bratwurst, and packaged luncheon meats. Salted nuts and seeds. Canned beans with salt. Dairy Whole or 2% milk, cream, half-and-half, and cream cheese. Whole-fat or sweetened yogurt. Full-fat cheeses or blue cheese.  Nondairy creamers and whipped toppings. Processed cheese, cheese spreads, or cheese curds. Condiments Onion and garlic salt, seasoned salt, table salt, and sea salt. Canned and packaged gravies. Worcestershire sauce. Tartar sauce. Barbecue sauce. Teriyaki  sauce. Soy sauce, including reduced sodium. Steak sauce. Fish sauce. Oyster sauce. Cocktail sauce. Horseradish. Ketchup and mustard. Meat flavorings and tenderizers. Bouillon cubes. Hot sauce. Tabasco sauce. Marinades. Taco seasonings. Relishes. Fats and Oils Butter, stick margarine, lard, shortening, ghee, and bacon fat. Coconut, palm kernel, or palm oils. Regular salad dressings. Other Pickles and olives. Salted popcorn and pretzels. The items listed above may not be a complete list of foods and beverages to avoid. Contact your dietitian for more information. WHERE CAN I FIND MORE INFORMATION? National Heart, Lung, and Blood Institute: travelstabloid.com Document Released: 10/23/2011 Document Revised: 03/20/2014 Document Reviewed: 09/07/2013 [facility] Patient Information 2015 [place], [place]. This information is not intended to replace advice given to you by your health care provider. Make sure you discuss any questions you have with your health care provider.

## 2014-07-21 ENCOUNTER — Other Ambulatory Visit: Payer: Self-pay | Admitting: Internal Medicine

## 2014-07-21 DIAGNOSIS — B20 Human immunodeficiency virus [HIV] disease: Secondary | ICD-10-CM

## 2014-07-25 ENCOUNTER — Other Ambulatory Visit: Payer: Self-pay | Admitting: *Deleted

## 2014-07-25 DIAGNOSIS — B20 Human immunodeficiency virus [HIV] disease: Secondary | ICD-10-CM

## 2014-07-25 MED ORDER — RILPIVIRINE HCL 25 MG PO TABS: 25.0000 mg | ORAL_TABLET | Freq: Every day | ORAL | Status: DC

## 2014-07-25 MED ORDER — LAMIVUDINE 300 MG PO TABS: 300.0000 mg | ORAL_TABLET | Freq: Every day | ORAL | Status: DC

## 2014-07-25 MED ORDER — DOLUTEGRAVIR SODIUM 50 MG PO TABS: 50.0000 mg | ORAL_TABLET | Freq: Every day | ORAL | Status: DC

## 2014-07-31 ENCOUNTER — Ambulatory Visit: Payer: Self-pay | Admitting: Obstetrics & Gynecology

## 2014-08-08 ENCOUNTER — Ambulatory Visit: Payer: Self-pay

## 2014-08-15 ENCOUNTER — Encounter: Payer: Self-pay | Admitting: Infectious Disease

## 2014-08-21 ENCOUNTER — Other Ambulatory Visit: Payer: Self-pay | Admitting: Infectious Disease

## 2014-08-21 DIAGNOSIS — I10 Essential (primary) hypertension: Secondary | ICD-10-CM

## 2014-08-22 ENCOUNTER — Ambulatory Visit: Payer: Self-pay

## 2014-08-29 ENCOUNTER — Encounter: Payer: Self-pay | Admitting: Infectious Disease

## 2014-08-31 ENCOUNTER — Other Ambulatory Visit: Payer: Self-pay | Admitting: *Deleted

## 2014-08-31 ENCOUNTER — Other Ambulatory Visit: Payer: Self-pay | Admitting: Infectious Disease

## 2014-08-31 DIAGNOSIS — B20 Human immunodeficiency virus [HIV] disease: Secondary | ICD-10-CM

## 2014-08-31 MED ORDER — DOLUTEGRAVIR SODIUM 50 MG PO TABS: 50.0000 mg | ORAL_TABLET | Freq: Every day | ORAL | Status: DC

## 2014-09-05 ENCOUNTER — Encounter: Payer: Self-pay | Admitting: Infectious Disease

## 2014-09-14 ENCOUNTER — Ambulatory Visit: Payer: Self-pay

## 2014-09-18 ENCOUNTER — Encounter: Payer: Self-pay | Admitting: Family Medicine

## 2014-09-26 ENCOUNTER — Ambulatory Visit: Payer: Self-pay

## 2014-09-29 ENCOUNTER — Other Ambulatory Visit (INDEPENDENT_AMBULATORY_CARE_PROVIDER_SITE_OTHER): Payer: Self-pay

## 2014-09-29 DIAGNOSIS — B20 Human immunodeficiency virus [HIV] disease: Secondary | ICD-10-CM

## 2014-09-29 DIAGNOSIS — Z79899 Other long term (current) drug therapy: Secondary | ICD-10-CM

## 2014-09-29 DIAGNOSIS — Z113 Encounter for screening for infections with a predominantly sexual mode of transmission: Secondary | ICD-10-CM

## 2014-09-29 LAB — COMPLETE METABOLIC PANEL WITH GFR
ALBUMIN: 4 g/dL (ref 3.5–5.2)
ALT: 11 U/L (ref 0–35)
AST: 18 U/L (ref 0–37)
Alkaline Phosphatase: 81 U/L (ref 39–117)
BILIRUBIN TOTAL: 0.6 mg/dL (ref 0.2–1.2)
BUN: 7 mg/dL (ref 6–23)
CO2: 21 meq/L (ref 19–32)
Calcium: 9.7 mg/dL (ref 8.4–10.5)
Chloride: 101 mEq/L (ref 96–112)
Creat: 0.89 mg/dL (ref 0.50–1.10)
GFR, EST AFRICAN AMERICAN: 86 mL/min
GFR, EST NON AFRICAN AMERICAN: 74 mL/min
GLUCOSE: 89 mg/dL (ref 70–99)
POTASSIUM: 4.4 meq/L (ref 3.5–5.3)
Sodium: 136 mEq/L (ref 135–145)
TOTAL PROTEIN: 7.1 g/dL (ref 6.0–8.3)

## 2014-09-29 LAB — CBC WITH DIFFERENTIAL/PLATELET
Basophils Absolute: 0 10*3/uL (ref 0.0–0.1)
Basophils Relative: 0 % (ref 0–1)
EOS ABS: 0.1 10*3/uL (ref 0.0–0.7)
Eosinophils Relative: 1 % (ref 0–5)
HEMATOCRIT: 37.2 % (ref 36.0–46.0)
HEMOGLOBIN: 12.5 g/dL (ref 12.0–15.0)
LYMPHS ABS: 3.9 10*3/uL (ref 0.7–4.0)
Lymphocytes Relative: 45 % (ref 12–46)
MCH: 27.3 pg (ref 26.0–34.0)
MCHC: 33.6 g/dL (ref 30.0–36.0)
MCV: 81.2 fL (ref 78.0–100.0)
MONOS PCT: 6 % (ref 3–12)
Monocytes Absolute: 0.5 10*3/uL (ref 0.1–1.0)
Neutro Abs: 4.2 10*3/uL (ref 1.7–7.7)
Neutrophils Relative %: 48 % (ref 43–77)
Platelets: 320 10*3/uL (ref 150–400)
RBC: 4.58 MIL/uL (ref 3.87–5.11)
RDW: 18.7 % — ABNORMAL HIGH (ref 11.5–15.5)
WBC: 8.7 10*3/uL (ref 4.0–10.5)

## 2014-09-29 LAB — LIPID PANEL
Cholesterol: 205 mg/dL — ABNORMAL HIGH (ref 0–200)
HDL: 56 mg/dL (ref >39)
LDL Cholesterol: 105 mg/dL — ABNORMAL HIGH (ref 0–99)
Total CHOL/HDL Ratio: 3.7 Ratio
Triglycerides: 220 mg/dL — ABNORMAL HIGH (ref <150)
VLDL: 44 mg/dL — ABNORMAL HIGH (ref 0–40)

## 2014-09-29 LAB — T-HELPER CELL (CD4) - (RCID CLINIC ONLY)
CD4 % Helper T Cell: 17 % — ABNORMAL LOW (ref 33–55)
CD4 T Cell Abs: 670 /uL (ref 400–2700)

## 2014-09-30 LAB — RPR

## 2014-10-02 ENCOUNTER — Other Ambulatory Visit: Payer: Self-pay

## 2014-10-02 LAB — HIV-1 RNA QUANT-NO REFLEX-BLD: HIV-1 RNA Quant, Log: <1.3 {Log} (ref <1.30)

## 2014-10-05 ENCOUNTER — Ambulatory Visit: Payer: No Typology Code available for payment source | Admitting: Internal Medicine

## 2014-10-16 ENCOUNTER — Ambulatory Visit: Payer: No Typology Code available for payment source | Admitting: Internal Medicine

## 2014-10-18 ENCOUNTER — Ambulatory Visit (INDEPENDENT_AMBULATORY_CARE_PROVIDER_SITE_OTHER): Payer: Self-pay | Admitting: Infectious Disease

## 2014-10-18 ENCOUNTER — Encounter: Payer: Self-pay | Admitting: Infectious Disease

## 2014-10-18 ENCOUNTER — Other Ambulatory Visit: Payer: Self-pay | Admitting: *Deleted

## 2014-10-18 VITALS — BP 113/69 | HR 93 | Temp 98.1°F | Wt 222.0 lb

## 2014-10-18 DIAGNOSIS — G729 Myopathy, unspecified: Secondary | ICD-10-CM

## 2014-10-18 DIAGNOSIS — B20 Human immunodeficiency virus [HIV] disease: Secondary | ICD-10-CM

## 2014-10-18 DIAGNOSIS — I10 Essential (primary) hypertension: Secondary | ICD-10-CM

## 2014-10-18 MED ORDER — LAMIVUDINE 300 MG PO TABS: 300.0000 mg | ORAL_TABLET | Freq: Every day | ORAL | Status: DC

## 2014-10-18 MED ORDER — RILPIVIRINE HCL 25 MG PO TABS: 25.0000 mg | ORAL_TABLET | Freq: Every day | ORAL | Status: DC

## 2014-10-18 MED ORDER — DOLUTEGRAVIR SODIUM 50 MG PO TABS: 50.0000 mg | ORAL_TABLET | Freq: Every day | ORAL | Status: DC

## 2014-10-18 NOTE — Progress Notes (Signed)
Subjective:    Patient ID: Wanda Kane, female    DOB: 1961/07/27, 53 y.o.   MRN: [id number]  HPI   Ms Sapp is a 53 year old African American lady with HIV previous he cared for [facility] and then moved to [place].   While in [place] she apparently was on various antiretroviral regimens lastly on Isentress and complera.   She moved back in July was seen by Dr. Baxter Flattery in February. Her regimen was simplified to STRIBILD. She had tolerated this regimen without to much complaint other some nausea and loose stools.   However week prior to her seeing me in March 2014 she had  developed increasingly severe diffuse myalgias in her neck arms legs. She had similar symptoms in the past while out on antiretrovirals. She could not initially recall which antiretrovirals at apparently been blamed for the symptoms. However when I examined her allergy history he or she is listed as being allergic to tenofovir with alleged   tenofovir-induced rhabdomyolysis.  I  therefore crafted a new ARV regimen for her without TNF, namely once daily Tivicay, Edurant and Epivir. I  checked her for elevated CPK  Which was at 237. Since change to new TNF free regimen she had down titration of her CK to normal but was now  back above 200 again.   She has not been seen by Rheum due to lack of insurance.  I offered further Rheum workup with labs was unrevealing  She is very happy with current regimen as always.  She is perfectly well controlled and with healthy CD4  Lab Results  Component Value Date   HIV1RNAQUANT <20 09/29/2014   Lab Results  Component Value Date   CD4TABS 670 09/29/2014   CD4TABS 550 05/22/2014   CD4TABS 510 12/12/2013    We discussed various ACTG studies related to inflammation including REPREIVE and I introduced her to [employer]   Review of Systems  Constitutional: Negative for fever, chills, diaphoresis, activity change, appetite change, fatigue and unexpected weight  change.  HENT: Negative for congestion, rhinorrhea, sinus pressure, sneezing, sore throat and trouble swallowing.   Eyes: Negative for photophobia and visual disturbance.  Respiratory: Negative for cough, chest tightness, shortness of breath, wheezing and stridor.   Cardiovascular: Negative for chest pain, palpitations and leg swelling.  Gastrointestinal: Negative for nausea, vomiting, abdominal pain, diarrhea, constipation, blood in stool, abdominal distention and anal bleeding.  Genitourinary: Negative for dysuria, hematuria, flank pain and difficulty urinating.  Musculoskeletal: Negative for myalgias, back pain, joint swelling, arthralgias, gait problem and neck stiffness.  Skin: Negative for color change, pallor, rash and wound.  Neurological: Negative for dizziness, tremors, weakness and light-headedness.  Hematological: Negative for adenopathy. Does not bruise/bleed easily.  Psychiatric/Behavioral: Negative for behavioral problems, confusion, sleep disturbance, dysphoric mood, decreased concentration and agitation.       Objective:   Physical Exam  Constitutional: She is oriented to person, place, and time. She appears well-developed and well-nourished. No distress.  HENT:  Head: Normocephalic and atraumatic.  Mouth/Throat: Oropharynx is clear and moist. No oropharyngeal exudate.  Eyes: Conjunctivae and EOM are normal. No scleral icterus.  Neck: Normal range of motion. Neck supple. No JVD present.  Cardiovascular: Normal rate, regular rhythm and normal heart sounds.  Exam reveals no gallop and no friction rub.   No murmur heard. Pulmonary/Chest: Effort normal and breath sounds normal. No respiratory distress. She has no wheezes. She has no rales. She exhibits no tenderness.  Abdominal: She exhibits no distension  and no mass. There is no tenderness. There is no rebound and no guarding.  Musculoskeletal: She exhibits no edema or tenderness.  Lymphadenopathy:    She has no cervical  adenopathy.  Neurological: She is alert and oriented to person, place, and time. She exhibits normal muscle tone. Coordination normal.  Skin: Skin is warm and dry. She is not diaphoretic. No erythema. No pallor.  Psychiatric: She has a normal mood and affect. Her behavior is normal. Judgment and thought content normal.          Assessment & Plan:    HIV: continue current regimen. She needs to renew ADAP this January.  I spent greater than 25 minutes with the patient including greater than 50% of time in face to face counsel of the patient and in coordination of their care.   Myositis: not clear what is causing this but she is symptomatically better  HTN: well controlled  ACTG studies: referred her to our ACTG study options would be a great candidate

## 2014-10-24 ENCOUNTER — Ambulatory Visit: Payer: Self-pay

## 2014-10-25 ENCOUNTER — Ambulatory Visit (INDEPENDENT_AMBULATORY_CARE_PROVIDER_SITE_OTHER): Payer: Self-pay | Admitting: *Deleted

## 2014-10-25 VITALS — BP 129/79 | HR 84 | Temp 98.3°F | Resp 18 | Ht 64.75 in | Wt 224.5 lb

## 2014-10-25 DIAGNOSIS — Z006 Encounter for examination for normal comparison and control in clinical research program: Secondary | ICD-10-CM

## 2014-10-25 DIAGNOSIS — B20 Human immunodeficiency virus [HIV] disease: Secondary | ICD-10-CM

## 2014-10-25 LAB — LIPID PANEL
Cholesterol: 209 mg/dL — ABNORMAL HIGH (ref 0–200)
HDL: 53 mg/dL (ref >39)
LDL CALC: 122 mg/dL — AB (ref 0–99)
Total CHOL/HDL Ratio: 3.9 Ratio
Triglycerides: 171 mg/dL — ABNORMAL HIGH (ref <150)
VLDL: 34 mg/dL (ref 0–40)

## 2014-10-25 LAB — CBC WITH DIFFERENTIAL/PLATELET
BASOS ABS: 0 10*3/uL (ref 0.0–0.1)
Basophils Relative: 0 % (ref 0–1)
EOS ABS: 0.1 10*3/uL (ref 0.0–0.7)
Eosinophils Relative: 1 % (ref 0–5)
HCT: 27.5 % — ABNORMAL LOW (ref 36.0–46.0)
Hemoglobin: 9.1 g/dL — ABNORMAL LOW (ref 12.0–15.0)
Lymphocytes Relative: 42 % (ref 12–46)
Lymphs Abs: 3.2 10*3/uL (ref 0.7–4.0)
MCH: 25.9 pg — ABNORMAL LOW (ref 26.0–34.0)
MCHC: 33.1 g/dL (ref 30.0–36.0)
MCV: 78.1 fL (ref 78.0–100.0)
MPV: 10.1 fL (ref 9.4–12.4)
Monocytes Absolute: 0.5 10*3/uL (ref 0.1–1.0)
Monocytes Relative: 6 % (ref 3–12)
NEUTROS PCT: 51 % (ref 43–77)
Neutro Abs: 3.9 10*3/uL (ref 1.7–7.7)
Platelets: 509 10*3/uL — ABNORMAL HIGH (ref 150–400)
RBC: 3.52 MIL/uL — AB (ref 3.87–5.11)
RDW: 17.3 % — ABNORMAL HIGH (ref 11.5–15.5)
WBC: 7.6 10*3/uL (ref 4.0–10.5)

## 2014-10-25 LAB — COMPREHENSIVE METABOLIC PANEL
ALBUMIN: 4.3 g/dL (ref 3.5–5.2)
ALK PHOS: 82 U/L (ref 39–117)
ALT: 12 U/L (ref 0–35)
AST: 20 U/L (ref 0–37)
BUN: 9 mg/dL (ref 6–23)
CO2: 24 mEq/L (ref 19–32)
Calcium: 10 mg/dL (ref 8.4–10.5)
Chloride: 107 mEq/L (ref 96–112)
Creat: 0.81 mg/dL (ref 0.50–1.10)
Glucose, Bld: 94 mg/dL (ref 70–99)
Potassium: 4.3 mEq/L (ref 3.5–5.3)
SODIUM: 139 meq/L (ref 135–145)
TOTAL PROTEIN: 7.2 g/dL (ref 6.0–8.3)
Total Bilirubin: 0.3 mg/dL (ref 0.2–1.2)

## 2014-10-25 LAB — HCG, SERUM, QUALITATIVE: PREG SERUM: NEGATIVE

## 2014-10-25 NOTE — Progress Notes (Signed)
Wanda Kane is here today to screen for the Reprieve Study. Informed consent was obtained after we discussed it in detail. She had already had an opportunity to read over the consent at home. She verbalized understanding of the protocol and her responsibilities as a Estate manager/land agent. She was interested in the substudy and also signed the consent for that. She denies any current problems, except for being out of work. 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 She is currently on Tivicay, edurant and epivir. She has been positive since 1994 and been on several different regimens. She is going to bring me copies of her old medical records from outside the system so we can document which ARVs she has been on and the length of time for each. She has hypertension and is on medication for that. She is perimenopausal and says she has uterine fibroids, her most recent period was a few weeks ago. She is aware of the birth control requirements for the study. Once she is determined to be eligible, we will schedule the entry visit along with the CT scan of her heart.

## 2014-10-25 NOTE — Progress Notes (Signed)
   Subjective:    Patient ID: [name], female    DOB: [birth year], 53 y.o.   MRN: [id number]  HPI    Review of Systems  Constitutional: Negative.   HENT: Negative.   Eyes: Negative.   Respiratory: Negative.   Cardiovascular: Negative.   Gastrointestinal: Negative.   Genitourinary: Negative.   Musculoskeletal: Negative.   Skin: Negative.   Neurological: Negative.   Psychiatric/Behavioral: Negative.        Objective:   Physical Exam  Constitutional: She is oriented to person, place, and time.  HENT:  Mouth/Throat: Oropharynx is clear and moist.  Eyes: No scleral icterus.  Cardiovascular: Normal rate, regular rhythm, normal heart sounds and intact distal pulses.   Pulmonary/Chest: Effort normal and breath sounds normal. No respiratory distress.  Abdominal: Soft. Bowel sounds are normal.  Musculoskeletal: Normal range of motion. She exhibits no edema.  Lymphadenopathy:    She has no cervical adenopathy.  Neurological: She is alert and oriented to person, place, and time.  Skin: Skin is warm and dry.  Psychiatric: She has a normal mood and affect.          Assessment & Plan:

## 2014-10-26 ENCOUNTER — Ambulatory Visit (INDEPENDENT_AMBULATORY_CARE_PROVIDER_SITE_OTHER): Payer: No Typology Code available for payment source | Admitting: Internal Medicine

## 2014-10-26 VITALS — BP 119/52 | HR 91 | Temp 98.8°F | Resp 16 | Ht 66.0 in | Wt 224.0 lb

## 2014-10-26 DIAGNOSIS — E8881 Metabolic syndrome: Secondary | ICD-10-CM | POA: Insufficient documentation

## 2014-10-26 DIAGNOSIS — N951 Menopausal and female climacteric states: Secondary | ICD-10-CM | POA: Insufficient documentation

## 2014-10-26 DIAGNOSIS — L918 Other hypertrophic disorders of the skin: Secondary | ICD-10-CM | POA: Insufficient documentation

## 2014-10-26 DIAGNOSIS — E785 Hyperlipidemia, unspecified: Secondary | ICD-10-CM

## 2014-10-26 DIAGNOSIS — Z1211 Encounter for screening for malignant neoplasm of colon: Secondary | ICD-10-CM

## 2014-10-26 DIAGNOSIS — F32A Depression, unspecified: Secondary | ICD-10-CM | POA: Insufficient documentation

## 2014-10-26 DIAGNOSIS — F329 Major depressive disorder, single episode, unspecified: Secondary | ICD-10-CM

## 2014-10-26 NOTE — Assessment & Plan Note (Signed)
Although patient has an elevated LDL and total Cholesterol, her calculated 10 year risk is 3/1% and lifetime risk 39%. She is currently enrolling into a randomized study for Pitivastatin vs Placebo study.

## 2014-10-26 NOTE — Assessment & Plan Note (Signed)
Pt reports situational depression surrounding the loss of her job 2 years ago. She is thinking of enrolling in graduate school. She is a Education officer, museum by training. She does not want to begin any medications

## 2014-10-26 NOTE — Assessment & Plan Note (Signed)
Pt. having vasomotor symptoms, but does not want to take any more medications. Advised to check with ID Physician about taking Estroven OTC.

## 2014-10-26 NOTE — Progress Notes (Signed)
Patient ID: Wanda Kane, female   DOB: [birth year], 53 y.o.   MRN: [id number]   Wanda Kane, is a 53 y.o. female  QIW:[id number]  [id number]:[id number]  DOB - 06-Mar-1961  CC:  Chief Complaint  Patient presents with  . Follow-up       HPI: Wanda Kane is a 53 y.o. female here today for follow up on HTN and Cholesterol. Please see discussion below. She also is requesting skin tags be removed. She has depression but does not want any medications.  Patient has No headache, No chest pain, No abdominal pain - No Nausea, No new weakness tingling or numbness, No Cough - SOB.  Allergies  Allergen Reactions  . Viread [Tenofovir]     Rhabdo   Past Medical History  Diagnosis Date  . HIV infection   . Hypertension   . Fibroids   . Fibroid    Current Outpatient Prescriptions on File Prior to Visit  Medication Sig Dispense Refill  . amLODipine (NORVASC) 10 MG tablet TAKE 1 TABLET BY MOUTH EVERY DAY 30 tablet 5  . docusate sodium (COLACE) 100 MG capsule Take 1 capsule (100 mg total) by mouth 2 (two) times daily as needed. 30 capsule 2  . dolutegravir (TIVICAY) 50 MG tablet Take 1 tablet (50 mg total) by mouth daily. 30 tablet 5  . lamivudine (EPIVIR) 300 MG tablet Take 1 tablet (300 mg total) by mouth daily. 30 tablet 5  . lisinopril (PRINIVIL,ZESTRIL) 10 MG tablet TAKE 1 TABLET BY MOUTH EVERY DAY 30 tablet 5  . rilpivirine (EDURANT) 25 MG TABS tablet Take 1 tablet (25 mg total) by mouth daily with breakfast. 30 tablet 5  . spironolactone (ALDACTONE) 100 MG tablet Take 1 tablet (100 mg total) by mouth daily. 30 tablet 6  . Calcium Carbonate-Vitamin D (CALTRATE 600+D) 600-400 MG-UNIT per tablet Take 1 tablet by mouth daily. (Patient not taking: Reported on 10/26/2014) 30 tablet 1  . ferrous sulfate 325 (65 FE) MG EC tablet Take 1 tablet (325 mg total) by mouth 2 (two) times daily before a meal. (Patient not taking: Reported on 10/26/2014) 60 tablet 11   No current facility-administered medications  on file prior to visit.   Family History  Problem Relation Age of Onset  . Hypertension Mother   . CAD Brother   . Heart disease Brother 71    cardiac arrest   History   Social History  . Marital Status: Single    Spouse Name: N/A    Number of Children: N/A  . Years of Education: N/A   Occupational History  . Not on file.   Social History Main Topics  . Smoking status: Never Smoker   . Smokeless tobacco: Never Used  . Alcohol Use: No  . Drug Use: No  . Sexual Activity:    Partners: Male     Comment: patient declined condoms   Other Topics Concern  . Not on file   Social History Narrative    Review of Systems: Constitutional: Negative for fever, chills, diaphoresis, activity change, appetite change and fatigue. HENT: Negative for ear pain, nosebleeds, congestion, facial swelling, rhinorrhea, neck pain, neck stiffness and ear discharge.  Eyes: Negative for pain, discharge, redness, itching and visual disturbance. Respiratory: Negative for cough, choking, chest tightness, shortness of breath, wheezing and stridor.  Cardiovascular: Negative for chest pain, palpitations and leg swelling. Gastrointestinal: Negative for abdominal distention. Genitourinary: Negative for dysuria, urgency, frequency, hematuria, flank pain, decreased urine volume, difficulty urinating.  Musculoskeletal: Negative for  back pain, joint swelling, arthralgia and gait problem. Neurological: Negative for dizziness, tremors, seizures, syncope, facial asymmetry, speech difficulty, weakness, light-headedness, numbness and headaches.  Hematological: Negative for adenopathy. Does not bruise/bleed easily. Psychiatric/Behavioral: Negative for hallucinations, behavioral problems, confusion, dysphoric mood, decreased concentration and agitation.    Objective:   Filed Vitals:   10/26/14 1514  BP: 119/52  Pulse: 91  Temp: 98.8 F (37.1 C)  Resp: 16    Physical Exam: Constitutional: Patient appears  well-developed and well-nourished. No distress. HENT: Normocephalic, atraumatic, External right and left ear normal. Oropharynx is clear and moist.  Eyes: Conjunctivae and EOM are normal. PERRLA, no scleral icterus. Neck: Normal ROM. Neck supple. No JVD. No tracheal deviation. No thyromegaly. CVS: RRR, S1/S2 +, no murmurs, no gallops, no carotid bruit.  Pulmonary: Effort and breath sounds normal, no stridor, rhonchi, wheezes, rales.  Abdominal: Soft. BS +, no distension, tenderness, rebound or guarding.  Musculoskeletal: Normal range of motion. No edema and no tenderness.  Lymphadenopathy: No lymphadenopathy noted, cervical, inguinal or axillary Neuro: Alert. Normal reflexes, muscle tone coordination. No cranial nerve deficit. Skin: Skin is warm and dry. No rash noted. Not diaphoretic. No erythema. No pallor. Psychiatric: Normal mood and affect. Behavior, judgment, thought content normal.  Lab Results  Component Value Date   WBC 7.6 10/25/2014   HGB 9.1* 10/25/2014   HCT 27.5* 10/25/2014   MCV 78.1 10/25/2014   PLT 509* 10/25/2014   Lab Results  Component Value Date   CREATININE 0.81 10/25/2014   BUN 9 10/25/2014   NA 139 10/25/2014   K 4.3 10/25/2014   CL 107 10/25/2014   CO2 24 10/25/2014    Lab Results  Component Value Date   HGBA1C 6.1* 06/01/2013   Lipid Panel     Component Value Date/Time   CHOL 209* 10/25/2014 0947   TRIG 171* 10/25/2014 0947   HDL 53 10/25/2014 0947   CHOLHDL 3.9 10/25/2014 0947   VLDL 34 10/25/2014 0947   LDLCALC 122* 10/25/2014 0947       Assessment and plan:   1. Dyslipidemia - Although patient has an elevated LDL and total Cholesterol, her calculated 10 year risk is 3/1% and lifetime risk 39%. She is currently enrolling into a randomized study for Pitivastatin vs Placebo study.    2. Depression - Pt reports situational depression surrounding the loss of her job 2 years ago. She is thinking of enrolling in graduate school. She is a  Education officer, museum by training. She does not want to begin any medications  3. Perimenopause - Pt. having vasomotor symptoms, but does not want to take any more medications. Advised to check with ID Physician about taking Estroven OTC.   4. Metabolic syndrome - Discussed exercise program and carbohydrate limitation. Also discussed eating foods low in glycemic index. Will recheck Hb A1c in 3 months. If still at same level or higher, will further discuss Glucophage.  5. Cutaneous skin tags - Pt requesting removal of Cutaneous Skin tags. Will schedule an appointment for removal.     Return in about 3 months (around 01/25/2015).  The patient was given clear instructions to go to ER or return to medical center if symptoms don't improve, worsen or new problems develop. The patient verbalized understanding. The patient was told to call to get lab results if they haven't heard anything in the next week.     This note has been created with Surveyor, quantity. Any transcriptional errors are  unintentional.    MATTHEWS,MICHELLE A., MD [place] [place]   10/26/2014, 5:03 PM

## 2014-10-26 NOTE — Assessment & Plan Note (Signed)
Pt requesting removal of Cutaneous Skin tags. Will schedule an appointment for removal.

## 2014-10-30 ENCOUNTER — Other Ambulatory Visit: Payer: No Typology Code available for payment source

## 2014-10-30 NOTE — Addendum Note (Signed)
Addended by: Liston Alba A on: 10/30/2014 12:53 PM   Modules accepted: Orders

## 2014-11-01 IMAGING — US US TRANSVAGINAL NON-OB
1 series · 14 of 25 positions shown · non-contrast
Comparison: None

CLINICAL DATA: Menorrhagia, fibroids

EXAM:
TRANSABDOMINAL AND TRANSVAGINAL ULTRASOUND OF PELVIS
TECHNIQUE: Both transabdominal and transvaginal ultrasound examinations of the
pelvis were performed. Transabdominal technique was performed for
global imaging of the pelvis including uterus, ovaries, adnexal
regions, and pelvic cul-de-sac. It was necessary to proceed with
endovaginal exam following the transabdominal exam to visualize the
bilateral adnexa.

[Series 1: us pelvis complete · 14 of 62 slices shown]
[im 1/62]
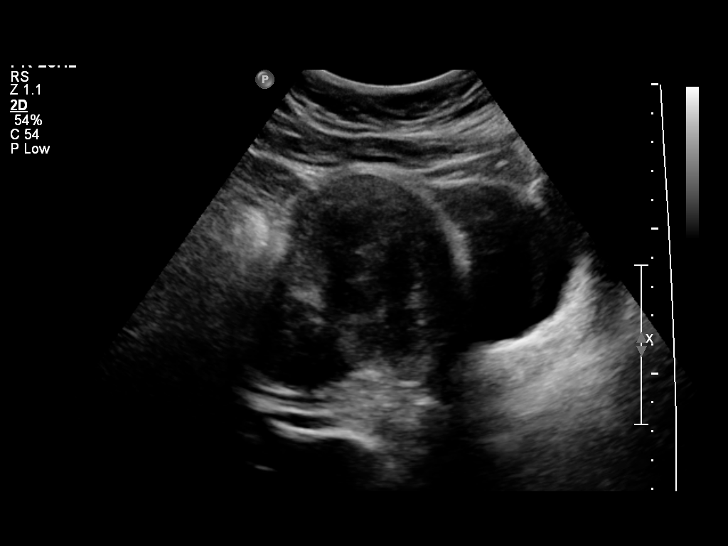
[im 6/62]
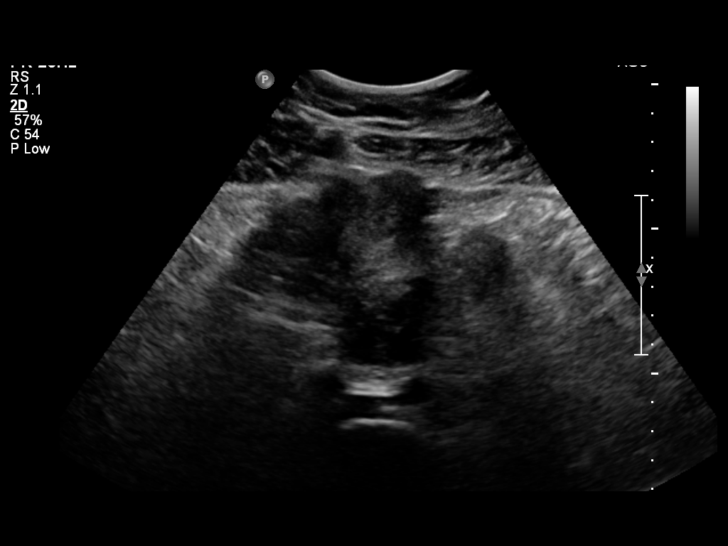
[im 11/62]
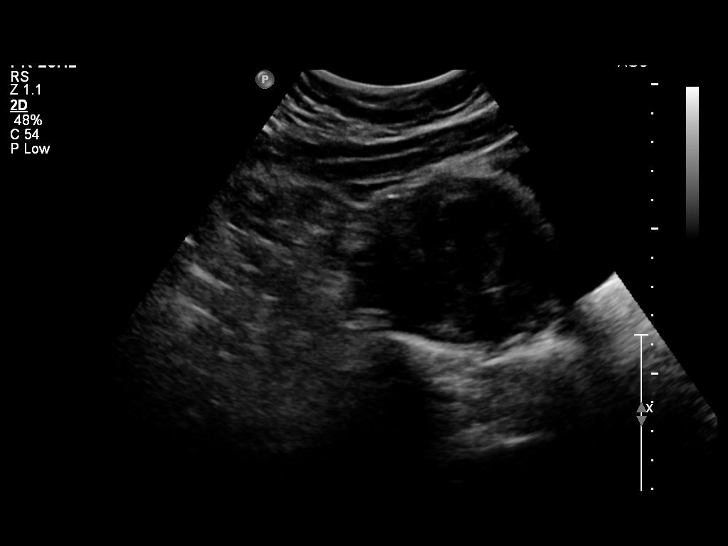
[im 16/62]
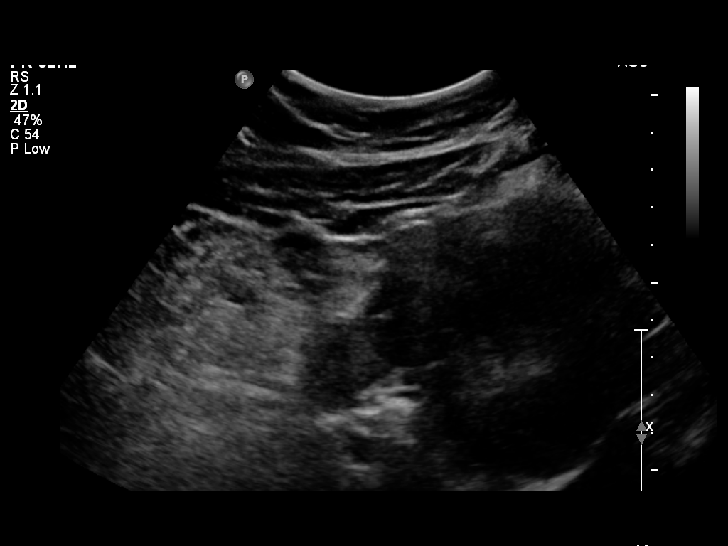
[im 21/62]
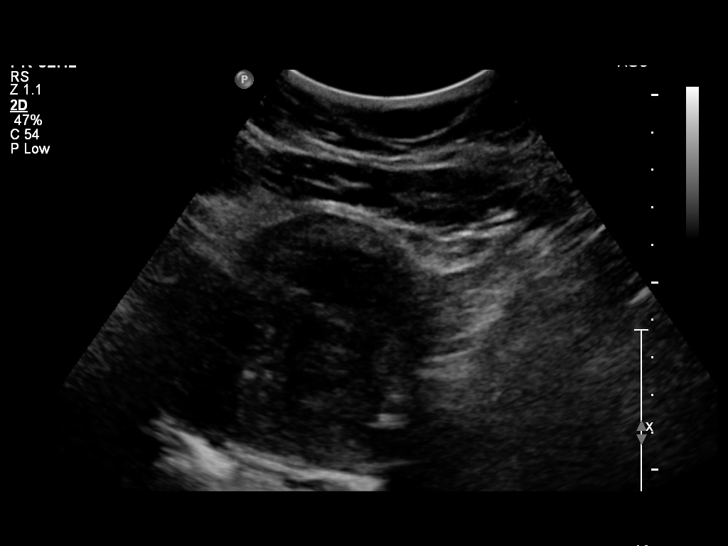
[im 23/62]
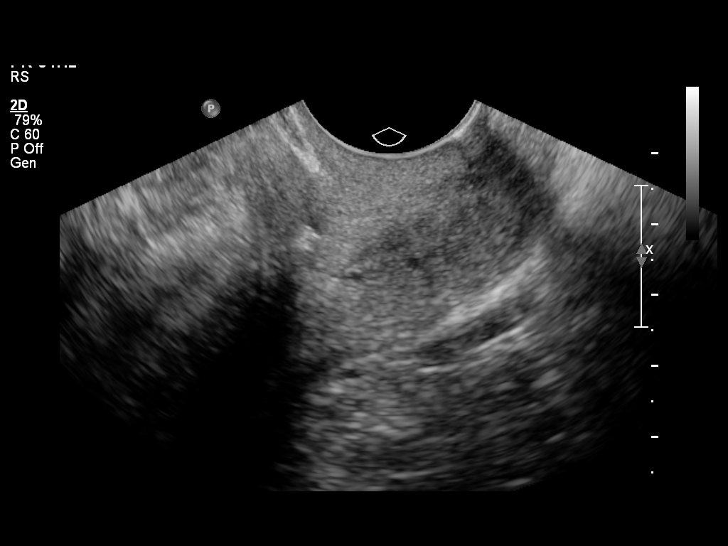
[im 28/62]
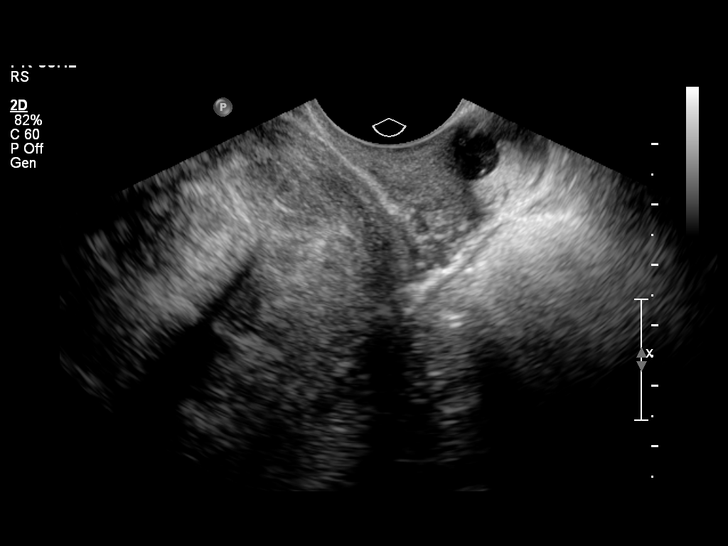
[im 34/62]
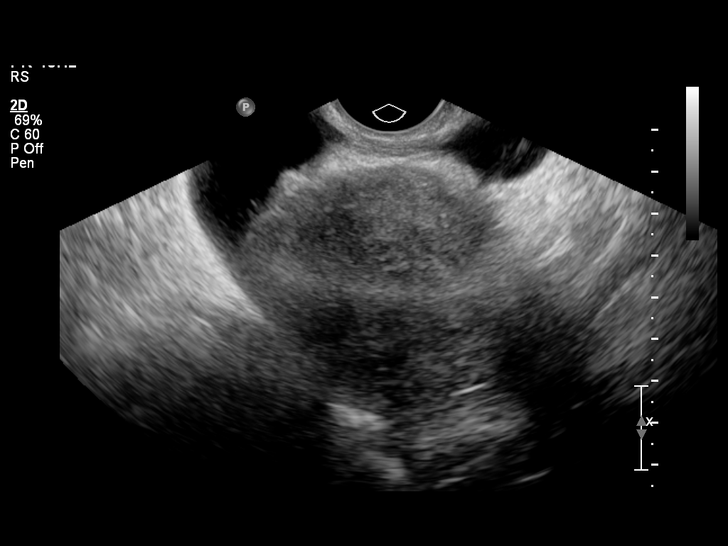
[im 39/62]
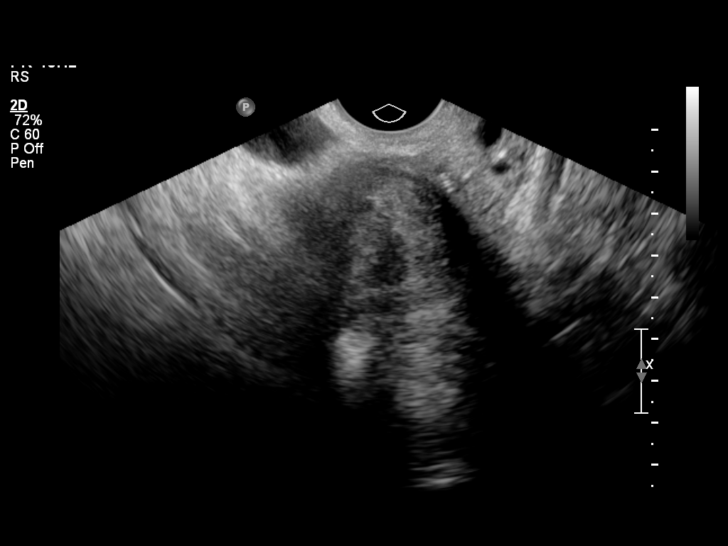
[im 41/62]
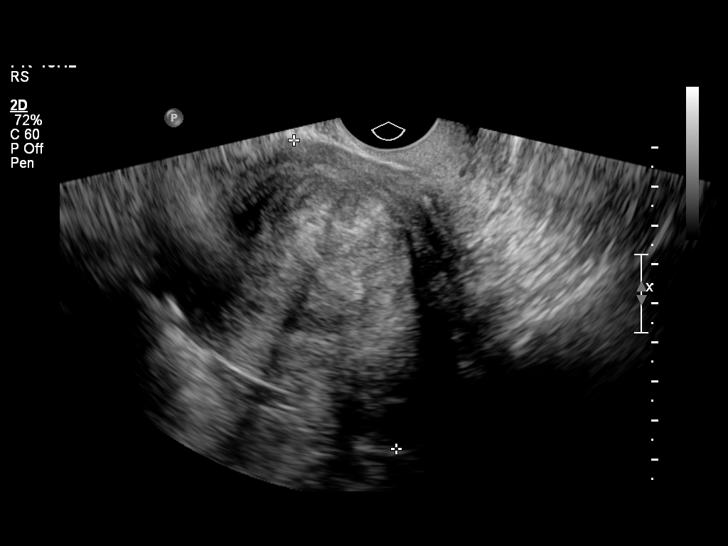
[im 46/62]
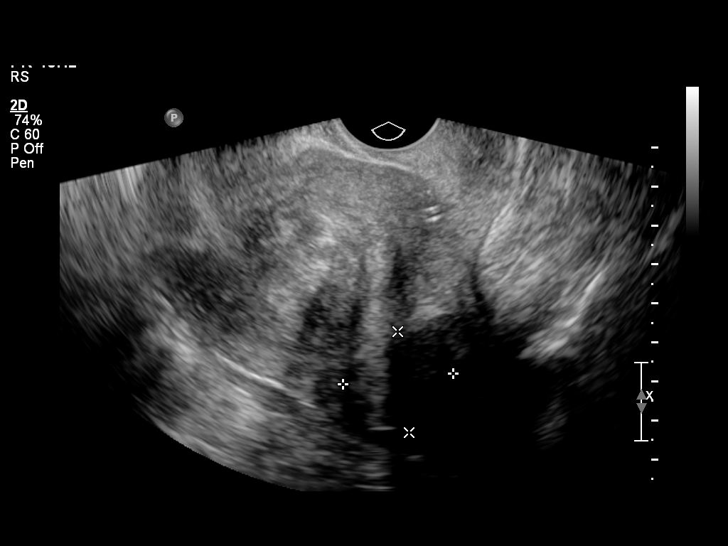
[im 51/62]
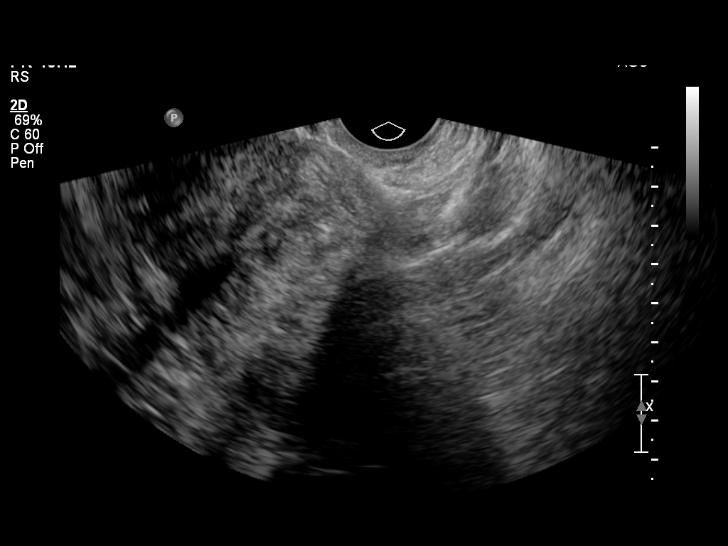
[im 56/62]
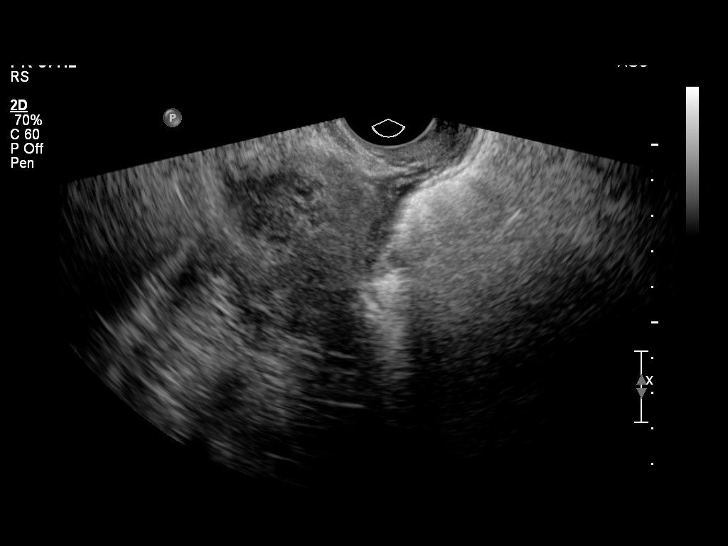
[im 62/62]
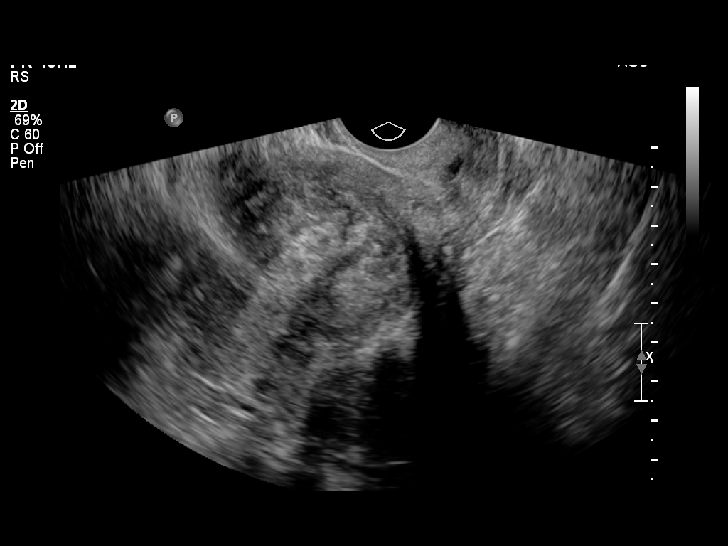

[14 of 25 positions shown; findings below may reference images not displayed]

FINDINGS: Uterus

Measurements: 10.2 x 2.4 x 7.6 cm. Dominant 5.9 x 4.7 x 4.7 cm
transmural fundal fibroid. Additional 2.8 x 2.6 x 3.0 cm subserosal
left posterior uterine body fibroid.

Endometrium

Poorly visualized due to uterine fibroids.

Right ovary

Not visualized transabdominally or transvaginally.

Left ovary

Not visualized transabdominally or transvaginally.

Other findings

No free fluid.
IMPRESSION: Uterine fibroids, including a dominant 5.9 cm fundal fibroid, as
described above.

Bilateral ovaries are not discretely visualized.

## 2014-11-06 ENCOUNTER — Telehealth: Payer: Self-pay | Admitting: Internal Medicine

## 2014-11-06 NOTE — Telephone Encounter (Signed)
Patient requested referral to dermatologist for skin tag removal. Patient has [employer].

## 2014-11-06 NOTE — Telephone Encounter (Signed)
Called and left message advising patient she will not need referral, she could call and schedule appointment at which every dermatologist she likes since this will be cosmetic and orange card will not cover. Thanks!

## 2014-11-07 ENCOUNTER — Ambulatory Visit: Payer: No Typology Code available for payment source

## 2014-11-17 DIAGNOSIS — I82409 Acute embolism and thrombosis of unspecified deep veins of unspecified lower extremity: Secondary | ICD-10-CM

## 2014-11-17 HISTORY — DX: Acute embolism and thrombosis of unspecified deep veins of unspecified lower extremity: I82.409

## 2014-11-21 ENCOUNTER — Encounter: Payer: Self-pay | Admitting: Infectious Disease

## 2014-11-21 ENCOUNTER — Ambulatory Visit: Payer: No Typology Code available for payment source

## 2014-11-27 ENCOUNTER — Ambulatory Visit: Payer: No Typology Code available for payment source | Admitting: Internal Medicine

## 2014-12-13 ENCOUNTER — Ambulatory Visit (INDEPENDENT_AMBULATORY_CARE_PROVIDER_SITE_OTHER): Payer: Self-pay | Admitting: *Deleted

## 2014-12-13 VITALS — BP 118/76 | HR 86 | Temp 98.4°F | Resp 16 | Wt 220.5 lb

## 2014-12-13 DIAGNOSIS — Z006 Encounter for examination for normal comparison and control in clinical research program: Secondary | ICD-10-CM

## 2014-12-13 LAB — COMPREHENSIVE METABOLIC PANEL
ALK PHOS: 79 U/L (ref 39–117)
ALT: 10 U/L (ref 0–35)
AST: 20 U/L (ref 0–37)
Albumin: 4.1 g/dL (ref 3.5–5.2)
BUN: 6 mg/dL (ref 6–23)
CHLORIDE: 102 meq/L (ref 96–112)
CO2: 24 mEq/L (ref 19–32)
CREATININE: 0.94 mg/dL (ref 0.50–1.10)
Calcium: 9.1 mg/dL (ref 8.4–10.5)
GLUCOSE: 89 mg/dL (ref 70–99)
POTASSIUM: 4.3 meq/L (ref 3.5–5.3)
SODIUM: 135 meq/L (ref 135–145)
Total Bilirubin: 0.3 mg/dL (ref 0.2–1.2)
Total Protein: 6.9 g/dL (ref 6.0–8.3)

## 2014-12-13 LAB — LIPID PANEL
Cholesterol: 180 mg/dL (ref 0–200)
HDL: 58 mg/dL (ref >39)
LDL Cholesterol: 93 mg/dL (ref 0–99)
Total CHOL/HDL Ratio: 3.1 Ratio
Triglycerides: 145 mg/dL (ref <150)
VLDL: 29 mg/dL (ref 0–40)

## 2014-12-13 LAB — CBC WITH DIFFERENTIAL/PLATELET
Basophils Absolute: 0 10*3/uL (ref 0.0–0.1)
Basophils Relative: 0 % (ref 0–1)
Eosinophils Absolute: 0.1 10*3/uL (ref 0.0–0.7)
Eosinophils Relative: 1 % (ref 0–5)
HEMATOCRIT: 26.7 % — AB (ref 36.0–46.0)
Hemoglobin: 8.3 g/dL — ABNORMAL LOW (ref 12.0–15.0)
LYMPHS PCT: 35 % (ref 12–46)
Lymphs Abs: 2.7 10*3/uL (ref 0.7–4.0)
MCH: 23.8 pg — ABNORMAL LOW (ref 26.0–34.0)
MCHC: 31.1 g/dL (ref 30.0–36.0)
MCV: 76.5 fL — ABNORMAL LOW (ref 78.0–100.0)
MONOS PCT: 6 % (ref 3–12)
MPV: 10.6 fL (ref 8.6–12.4)
Monocytes Absolute: 0.5 10*3/uL (ref 0.1–1.0)
NEUTROS ABS: 4.5 10*3/uL (ref 1.7–7.7)
Neutrophils Relative %: 58 % (ref 43–77)
Platelets: 457 10*3/uL — ABNORMAL HIGH (ref 150–400)
RBC: 3.49 MIL/uL — ABNORMAL LOW (ref 3.87–5.11)
RDW: 17.6 % — ABNORMAL HIGH (ref 11.5–15.5)
WBC: 7.7 10*3/uL (ref 4.0–10.5)

## 2014-12-13 NOTE — Progress Notes (Signed)
Wanda Kane is here to screen again for the [employer] study. We were originally unable to get her scheduled in time for the substudy so we had to rescreen to get more recent labs. Informed consent was obtained again after further discussion about the study.  I will call her back to schedule once I can get a date planned for the CT. She denies any current problems but does state that her periods are lasting longer and she had started feeling more tired so she started taking iron daily again about 5 days ago. She also has noticed numbness in varying parts of her body when she sleeps and attributes it to her mattress.

## 2014-12-13 NOTE — Progress Notes (Signed)
   Subjective:    Patient ID: Wanda Kane, female    DOB: 02/02/1961, 54 y.o.   MRN: [id number]  HPI    Review of Systems  Constitutional: Negative.   HENT: Negative.   Eyes: Negative.   Respiratory: Negative.   Cardiovascular: Negative.   Gastrointestinal: Negative.   Genitourinary: Negative.   Musculoskeletal: Negative.   Skin: Negative.   Neurological: Negative.   Psychiatric/Behavioral: Negative.        Objective:   Physical Exam  Constitutional: She is oriented to person, place, and time.  HENT:  Mouth/Throat: Oropharynx is clear and moist.  Eyes: Scleral icterus is present.  Cardiovascular: Normal rate, regular rhythm, normal heart sounds and intact distal pulses.   Pulmonary/Chest: Effort normal and breath sounds normal.  Abdominal: Soft. Bowel sounds are normal.  Musculoskeletal: She exhibits no edema.  Lymphadenopathy:    She has no cervical adenopathy.  Neurological: She is alert and oriented to person, place, and time.  Skin: Skin is warm and dry.  Psychiatric: She has a normal mood and affect.          Assessment & Plan:

## 2014-12-14 ENCOUNTER — Other Ambulatory Visit: Payer: Self-pay | Admitting: Infectious Disease

## 2014-12-14 DIAGNOSIS — Z006 Encounter for examination for normal comparison and control in clinical research program: Secondary | ICD-10-CM

## 2014-12-14 LAB — HCG, SERUM, QUALITATIVE: Preg, Serum: NEGATIVE

## 2014-12-17 ENCOUNTER — Emergency Department (HOSPITAL_COMMUNITY)
Admission: EM | Admit: 2014-12-17 | Discharge: 2014-12-17 | Disposition: A | Discharge status: Home / Self Care | Payer: No Typology Code available for payment source | Attending: Emergency Medicine | Admitting: Emergency Medicine

## 2014-12-17 ENCOUNTER — Encounter (HOSPITAL_COMMUNITY): Payer: Self-pay | Admitting: *Deleted

## 2014-12-17 DIAGNOSIS — Z79899 Other long term (current) drug therapy: Secondary | ICD-10-CM | POA: Insufficient documentation

## 2014-12-17 DIAGNOSIS — Z8742 Personal history of other diseases of the female genital tract: Secondary | ICD-10-CM | POA: Insufficient documentation

## 2014-12-17 DIAGNOSIS — Z21 Asymptomatic human immunodeficiency virus [HIV] infection status: Secondary | ICD-10-CM | POA: Insufficient documentation

## 2014-12-17 DIAGNOSIS — M79661 Pain in right lower leg: Secondary | ICD-10-CM

## 2014-12-17 DIAGNOSIS — Z9889 Other specified postprocedural states: Secondary | ICD-10-CM | POA: Insufficient documentation

## 2014-12-17 DIAGNOSIS — I82401 Acute embolism and thrombosis of unspecified deep veins of right lower extremity: Secondary | ICD-10-CM

## 2014-12-17 DIAGNOSIS — I10 Essential (primary) hypertension: Secondary | ICD-10-CM | POA: Insufficient documentation

## 2014-12-17 DIAGNOSIS — I82491 Acute embolism and thrombosis of other specified deep vein of right lower extremity: Secondary | ICD-10-CM | POA: Insufficient documentation

## 2014-12-17 LAB — CBC WITH DIFFERENTIAL/PLATELET
BASOS ABS: 0 10*3/uL (ref 0.0–0.1)
Basophils Relative: 0 % (ref 0–1)
EOS ABS: 0.1 10*3/uL (ref 0.0–0.7)
Eosinophils Relative: 1 % (ref 0–5)
HCT: 27.8 % — ABNORMAL LOW (ref 36.0–46.0)
Hemoglobin: 8.4 g/dL — ABNORMAL LOW (ref 12.0–15.0)
LYMPHS PCT: 36 % (ref 12–46)
Lymphs Abs: 3.7 10*3/uL (ref 0.7–4.0)
MCH: 24.4 pg — ABNORMAL LOW (ref 26.0–34.0)
MCHC: 30.2 g/dL (ref 30.0–36.0)
MCV: 80.8 fL (ref 78.0–100.0)
MONOS PCT: 7 % (ref 3–12)
Monocytes Absolute: 0.8 10*3/uL (ref 0.1–1.0)
NEUTROS ABS: 5.9 10*3/uL (ref 1.7–7.7)
NEUTROS PCT: 56 % (ref 43–77)
Platelets: 460 10*3/uL — ABNORMAL HIGH (ref 150–400)
RBC: 3.44 MIL/uL — AB (ref 3.87–5.11)
RDW: 18 % — AB (ref 11.5–15.5)
WBC: 10.6 10*3/uL — AB (ref 4.0–10.5)

## 2014-12-17 LAB — I-STAT CHEM 8, ED
BUN: 6 mg/dL (ref 6–23)
CALCIUM ION: 1.2 mmol/L (ref 1.12–1.23)
Chloride: 106 mmol/L (ref 96–112)
Creatinine, Ser: 0.8 mg/dL (ref 0.50–1.10)
Glucose, Bld: 78 mg/dL (ref 70–99)
HCT: 30 % — ABNORMAL LOW (ref 36.0–46.0)
HEMOGLOBIN: 10.2 g/dL — AB (ref 12.0–15.0)
POTASSIUM: 3.8 mmol/L (ref 3.5–5.1)
Sodium: 139 mmol/L (ref 135–145)
TCO2: 20 mmol/L (ref 0–100)

## 2014-12-17 LAB — PROTIME-INR
INR: 1.12 (ref 0.00–1.49)
Prothrombin Time: 14.6 seconds (ref 11.6–15.2)

## 2014-12-17 MED ORDER — RIVAROXABAN (XARELTO) EDUCATION KIT FOR DVT/PE PATIENTS
PACK | Freq: Once | Status: AC
  Administered 2014-12-17: 18:00:00
  Filled 2014-12-17: qty 1

## 2014-12-17 MED ORDER — RIVAROXABAN 15 MG PO TABS
15.0000 mg | ORAL_TABLET | Freq: Once | ORAL | Status: AC
  Administered 2014-12-17: 15 mg via ORAL
  Filled 2014-12-17: qty 1

## 2014-12-17 MED ORDER — XARELTO VTE STARTER PACK 15 & 20 MG PO TBPK: 15.0000 mg | ORAL_TABLET | ORAL | Status: DC

## 2014-12-17 NOTE — ED Provider Notes (Signed)
CSN: [id number]     Arrival date & time 12/17/14  1522 History   First MD Initiated Contact with Patient 12/17/14 1542     Chief Complaint  Patient presents with  . Leg Pain     (Consider location/radiation/quality/duration/timing/severity/associated sxs/prior Treatment) HPI Comments: Patient with history of HIV, normal CD4 count per patient presents with complaint of right calf tenderness for 3 days. No initial injury. Patient states that the area feels warm, has a dull ache. Pain is not worse with movement or standing. Patient does not feel that it is swollen. She states that walking on the area makes the pain better. No chest pain or shortness of breath. Patient denies risk factors for DVT/PE including: unilateral leg swelling, history of DVT/PE/other blood clots, use of estrogens, recent immobilizations, recent surgery, recent travel (>4hr segment), malignancy, hemoptysis. Patient has history of rhabdomyolysis due to a previously discontinued medication. She had full body muscle aches and this does not feel similar.    Patient is a 54 y.o. female presenting with leg pain. The history is provided by the patient.  Leg Pain Associated symptoms: no fever     Past Medical History  Diagnosis Date  . HIV infection   . Hypertension   . Fibroids   . Fibroid    Past Surgical History  Procedure Laterality Date  . Cesarean section  1983  . Tonsillectomy and adenoidectomy    . Cesarean section     Family History  Problem Relation Age of Onset  . Hypertension Mother   . CAD Brother   . Heart disease Brother 78    cardiac arrest   History  Substance Use Topics  . Smoking status: Never Smoker   . Smokeless tobacco: Never Used  . Alcohol Use: No   OB History    Gravida Para Term Preterm AB TAB SAB Ectopic Multiple Living   1 1 1  0 0 0 0 0 0 1     Review of Systems  Constitutional: Negative for fever.  HENT: Negative for rhinorrhea and sore throat.   Eyes: Negative for redness.   Respiratory: Negative for cough and shortness of breath.   Cardiovascular: Negative for chest pain and leg swelling.  Gastrointestinal: Negative for nausea, vomiting, abdominal pain and diarrhea.  Genitourinary: Negative for dysuria.  Musculoskeletal: Positive for myalgias.  Skin: Negative for rash.  Neurological: Negative for headaches.   Allergies  Viread  Home Medications   Prior to Admission medications   Medication Sig Start Date End Date Taking? Authorizing Provider  amLODipine (NORVASC) 10 MG tablet TAKE 1 TABLET BY MOUTH EVERY DAY 08/22/14   Truman Hayward, MD  Calcium Carbonate-Vitamin D (CALTRATE 600+D) 600-400 MG-UNIT per tablet Take 1 tablet by mouth daily. Patient not taking: Reported on 10/26/2014 07/13/14   Dorena Dew, FNP  docusate sodium (COLACE) 100 MG capsule Take 1 capsule (100 mg total) by mouth 2 (two) times daily as needed. 12/19/13   Osborne Oman, MD  dolutegravir (TIVICAY) 50 MG tablet Take 1 tablet (50 mg total) by mouth daily. 10/18/14   Truman Hayward, MD  ferrous sulfate 325 (65 FE) MG EC tablet Take 1 tablet (325 mg total) by mouth 2 (two) times daily before a meal. Patient not taking: Reported on 10/26/2014 10/17/13   Truman Hayward, MD  lamivudine (EPIVIR) 300 MG tablet Take 1 tablet (300 mg total) by mouth daily. 10/18/14   Truman Hayward, MD  lisinopril (Anchor)  10 MG tablet TAKE 1 TABLET BY MOUTH EVERY DAY 08/22/14   Truman Hayward, MD  rilpivirine [facility]) 25 MG TABS tablet Take 1 tablet (25 mg total) by mouth daily with breakfast. 10/18/14   Truman Hayward, MD  spironolactone (ALDACTONE) 100 MG tablet Take 1 tablet (100 mg total) by mouth daily. 05/29/14   Truman Hayward, MD   BP 151/77 mmHg  Pulse 110  Temp(Src) 98.2 F (36.8 C) (Oral)  Resp 18  SpO2 96%  LMP 11/29/2014 (Approximate)   Physical Exam  Constitutional: She appears well-developed and well-nourished.  HENT:  Head: Normocephalic and  atraumatic.  Eyes: Conjunctivae are normal.  Neck: Normal range of motion. Neck supple.  Pulmonary/Chest: No respiratory distress.  Musculoskeletal: She exhibits tenderness. She exhibits no edema.       Right hip: Normal.       Right knee: Normal.       Right ankle: Normal.       Right upper leg: Normal.       Right lower leg: She exhibits tenderness. She exhibits no bony tenderness, no swelling and no edema.       Legs: Neurological: She is alert.  Skin: Skin is warm and dry.  Psychiatric: She has a normal mood and affect.  Nursing note and vitals reviewed.   ED Course  Procedures (including critical care time) Labs Review Labs Reviewed  CBC WITH DIFFERENTIAL/PLATELET - Abnormal; Notable for the following:    WBC 10.6 (*)    RBC 3.44 (*)    Hemoglobin 8.4 (*)    HCT 27.8 (*)    MCH 24.4 (*)    RDW 18.0 (*)    Platelets 460 (*)    All other components within normal limits  I-STAT CHEM 8, ED - Abnormal; Notable for the following:    Hemoglobin 10.2 (*)    HCT 30.0 (*)    All other components within normal limits  PROTIME-INR    Imaging Review No results found.   EKG Interpretation None       3:48 PM Patient seen and examined. Patient is concerned that she may have a DVT. Ultrasound ordered. Pending.   Vital signs reviewed and are as follows: BP 151/77 mmHg  Pulse 110  Temp(Src) 98.2 F (36.8 C) (Oral)  Resp 18  SpO2 96%  LMP 11/29/2014 (Approximate)  4:09 PM Handoff to Kir[place]ko PA-C at shift change. She will f/u on [place] results.    MDM   Final diagnoses:  DVT (deep venous thrombosis), right   Pending [place].     Carlisle Cater, PA-C 12/18/14 2220  Dot Lanes, MD 12/20/14 2152

## 2014-12-17 NOTE — ED Notes (Signed)
Declined W/C at D/C and was escorted to lobby by RN. 

## 2014-12-17 NOTE — Progress Notes (Signed)
VASCULAR LAB PRELIMINARY  PRELIMINARY  PRELIMINARY  PRELIMINARY  Right lower extremity venous Doppler completed.    Preliminary report:  There is acute DVT in the posterior tibial and peroneal veins of the right lower extremity.  Zoejane Gaulin, RVT 12/17/2014, 4:48 PM

## 2014-12-17 NOTE — ED Provider Notes (Signed)
Pt signed out to me at shift change. Patient waiting on venous Doppler of the right lower leg to rule out DVT.    Patient's venous Doppler showed blood clot in the right lower leg. I discussed results with patient.  Pt with non traumatic pain to the right lower leg onset about 3 days ago. No swelling. No fever, chills. States she did injure her 4 month ago, but that has healed. She states pain is worse with ambulation. She denies history of blood clots. No recent travel or surgeries. Has been taking over-the-counter medication with minimal relief. No chest pain or shortness of breath. On his xarelto. Pt given prescription and starter pack. Also i emailed [phone], case manager since none covering ed at this time, regarding best course of action, given she has no insurance and uses [employer] card. Pt does have a primary care doctor, Dr. Zigmund Daniel, she will call her tomorrow for follow-up appointment. Pt understands her diagnosis and plan of treatment.   Filed Vitals:   12/17/14 1524 12/17/14 1732  BP: 151/77 123/50  Pulse: 110 90  Temp: 98.2 F (36.8 C) 98.1 F (36.7 C)  TempSrc: Oral Oral  Resp: 18 18  SpO2: 96% 100%     Renold Genta, PA-C 12/17/14 [place], MD 12/17/14 [phone]

## 2014-12-17 NOTE — Discharge Instructions (Signed)
Ibuprofen or tylenol for pain. Start xarelto, take daily. I have emailed our case manager, Kennon Holter, she should give you a call tomorrow. If any issues call back here. If any signs of heavy bleeding come to the ER. If chest pain, shortness of breath, dizziness, coughing up blood, return to the ER   Deep Vein Thrombosis A deep vein thrombosis (DVT) is a blood clot that develops in the deep, larger veins of the leg, arm, or pelvis. These are more dangerous than clots that might form in veins near the surface of the body. A DVT can lead to serious and even life-threatening complications if the clot breaks off and travels in the bloodstream to the lungs.  A DVT can damage the valves in your leg veins so that instead of flowing upward, the blood pools in the lower leg. This is called post-thrombotic syndrome, and it can result in pain, swelling, discoloration, and sores on the leg. CAUSES Usually, several things contribute to the formation of blood clots. Contributing factors include:  The flow of blood slows down.  The inside of the vein is damaged in some way.  You have a condition that makes blood clot more easily. RISK FACTORS Some people are more likely than others to develop blood clots. Risk factors include:   Smoking.  Being overweight (obese).  Sitting or lying still for a long time. This includes long-distance travel, paralysis, or recovery from an illness or surgery. Other factors that increase risk are:   Older age, especially over 54 years of age.  Having a family history of blood clots or if you have already had a blot clot.  Having major or lengthy surgery. This is especially true for surgery on the hip, knee, or belly (abdomen). Hip surgery is particularly high risk.  Having a long, thin tube (catheter) placed inside a vein during a medical procedure.  Breaking a hip or leg.  Having cancer or cancer treatment.  Pregnancy and childbirth.  Hormone changes make the blood  clot more easily during pregnancy.  The fetus puts pressure on the veins of the pelvis.  There is a risk of injury to veins during delivery or a caesarean delivery. The risk is highest just after childbirth.  Medicines containing the female hormone estrogen. This includes birth control pills and hormone replacement therapy.  Other circulation or heart problems.  SIGNS AND SYMPTOMS When a clot forms, it can either partially or totally block the blood flow in that vein. Symptoms of a DVT can include:  Swelling of the leg or arm, especially if one side is much worse.  Warmth and redness of the leg or arm, especially if one side is much worse.  Pain in an arm or leg. If the clot is in the leg, symptoms may be more noticeable or worse when standing or walking. The symptoms of a DVT that has traveled to the lungs (pulmonary embolism, PE) usually start suddenly and include:  Shortness of breath.  Coughing.  Coughing up blood or blood-tinged mucus.  Chest pain. The chest pain is often worse with deep breaths.  Rapid heartbeat. Anyone with these symptoms should get emergency medical treatment right away. Do not wait to see if the symptoms will go away. Call your local emergency services (911 in the [place] if you have these symptoms. Do not drive yourself to the hospital. DIAGNOSIS If a DVT is suspected, your health care provider will take a full medical history and perform a physical exam. Tests that  also may be required include:  Blood tests, including studies of the clotting properties of the blood.  Ultrasound to see if you have clots in your legs or lungs.  X-rays to show the flow of blood when dye is injected into the veins (venogram).  Studies of your lungs if you have any chest symptoms. PREVENTION  Exercise the legs regularly. Take a brisk 30-minute walk every day.  Maintain a weight that is appropriate for your height.  Avoid sitting or lying in bed for long periods of  time without moving your legs.  Women, particularly those over the age of 37 years, should consider the risks and benefits of taking estrogen medicines, including birth control pills.  Do not smoke, especially if you take estrogen medicines.  Long-distance travel can increase your risk of DVT. You should exercise your legs by walking or pumping the muscles every hour.  Many of the risk factors above relate to situations that exist with hospitalization, either for illness, injury, or elective surgery. Prevention may include medical and nonmedical measures.  Your health care provider will assess you for the need for venous thromboembolism prevention when you are admitted to the hospital. If you are having surgery, your surgeon will assess you the day of or day after surgery. TREATMENT Once identified, a DVT can be treated. It can also be prevented in some circumstances. Once you have had a DVT, you may be at increased risk for a DVT in the future. The most common treatment for DVT is blood-thinning (anticoagulant) medicine, which reduces the blood's tendency to clot. Anticoagulants can stop new blood clots from forming and stop old clots from growing. They cannot dissolve existing clots. Your body does this by itself over time. Anticoagulants can be given by mouth, through an IV tube, or by injection. Your health care provider will determine the best program for you. Other medicines or treatments that may be used are:  Heparin or related medicines (low molecular weight heparin) are often the first treatment for a blood clot. They act quickly. However, they cannot be taken orally and must be given either in shot form or by IV tube.  Heparin can cause a fall in a component of blood that stops bleeding and forms blood clots (platelets). You will be monitored with blood tests to be sure this does not occur.  Warfarin is an anticoagulant that can be swallowed. It takes a few days to start working, so  usually heparin or related medicines are used in combination. Once warfarin is working, heparin is usually stopped.  Factor Xa inhibitor medicines, such as rivaroxaban and apixaban, also reduce blood clotting. These medicines are taken orally and can often be used without heparin or related medicines.  Less commonly, clot dissolving drugs (thrombolytics) are used to dissolve a DVT. They carry a high risk of bleeding, so they are used mainly in severe cases where your life or a part of your body is threatened.  Very rarely, a blood clot in the leg needs to be removed surgically.  If you are unable to take anticoagulants, your health care provider may arrange for you to have a filter placed in a main vein in your abdomen. This filter prevents clots from traveling to your lungs. HOME CARE INSTRUCTIONS  Take all medicines as directed by your health care provider.  Learn as much as you can about DVT.  Wear a medical alert bracelet or carry a medical alert card.  Ask your health care provider how  soon you can go back to normal activities. It is important to stay active to prevent blood clots. If you are on anticoagulant medicine, avoid contact sports.  It is very important to exercise. This is especially important while traveling, sitting, or standing for long periods of time. Exercise your legs by walking or by tightening and relaxing your leg muscles regularly. Take frequent walks.  You may need to wear compression stockings. These are tight elastic stockings that apply pressure to the lower legs. This pressure can help keep the blood in the legs from clotting. Taking Warfarin Warfarin is a daily medicine that is taken by mouth. Your health care provider will advise you on the length of treatment (usually 3-6 months, sometimes lifelong). If you take warfarin:  Understand how to take warfarin and foods that can affect how warfarin works in Veterinary surgeon.  Too much and too little warfarin are both  dangerous. Too much warfarin increases the risk of bleeding. Too little warfarin continues to allow the risk for blood clots. Warfarin and Regular Blood Testing While taking warfarin, you will need to have regular blood tests to measure your blood clotting time. These blood tests usually include both the prothrombin time (PT) and international normalized ratio (INR) tests. The PT and INR results allow your health care provider to adjust your dose of warfarin. It is very important that you have your PT and INR tested as often as directed by your health care provider.  Warfarin and Your Diet Avoid major changes in your diet, or notify your health care provider before changing your diet. Arrange a visit with a registered dietitian to answer your questions. Many foods, especially foods high in vitamin K, can interfere with warfarin and affect the PT and INR results. You should eat a consistent amount of foods high in vitamin K. Foods high in vitamin K include:   Spinach, kale, broccoli, cabbage, collard and turnip greens, Brussels sprouts, peas, cauliflower, seaweed, and parsley.  Beef and pork liver.  Green tea.  Soybean oil. Warfarin with Other Medicines Many medicines can interfere with warfarin and affect the PT and INR results. You must:  Tell your health care provider about any and all medicines, vitamins, and supplements you take, including aspirin and other over-the-counter anti-inflammatory medicines. Be especially cautious with aspirin and anti-inflammatory medicines. Ask your health care provider before taking these.  Do not take or discontinue any prescribed or over-the-counter medicine except on the advice of your health care provider or pharmacist. Warfarin Side Effects Warfarin can have side effects, such as easy bruising and difficulty stopping bleeding. Ask your health care provider or pharmacist about other side effects of warfarin. You will need to:  Hold pressure over cuts for  longer than usual.  Notify your dentist and other health care providers that you are taking warfarin before you undergo any procedures where bleeding may occur. Warfarin with Alcohol and Tobacco   Drinking alcohol frequently can increase the effect of warfarin, leading to excess bleeding. It is best to avoid alcoholic drinks or to consume only very small amounts while taking warfarin. Notify your health care provider if you change your alcohol intake.   Do not use any tobacco products including cigarettes, chewing tobacco, or electronic cigarettes. If you smoke, quit. Ask your health care provider for help with quitting smoking. Alternative Medicines to Warfarin: Factor Xa Inhibitor Medicines  These blood-thinning medicines are taken by mouth, usually for several weeks or longer. It is important to take the  medicine every single day at the same time each day.  There are no regular blood tests required when using these medicines.  There are fewer food and drug interactions than with warfarin.  The side effects of this class of medicine are similar to those of warfarin, including excessive bruising or bleeding. Ask your health care provider or pharmacist about other potential side effects. SEEK MEDICAL CARE IF:  You notice a rapid heartbeat.  You feel weaker or more tired than usual.  You feel faint.  You notice increased bruising.  You feel your symptoms are not getting better in the time expected.  You believe you are having side effects of medicine. SEEK IMMEDIATE MEDICAL CARE IF:  You have chest pain.  You have trouble breathing.  You have new or increased swelling or pain in one leg.  You cough up blood.  You notice blood in vomit, in a bowel movement, or in urine. MAKE SURE YOU:  Understand these instructions.  Will watch your condition.  Will get help right away if you are not doing well or get worse. Document Released: 11/03/2005 Document Revised: 03/20/2014  Document Reviewed: 07/11/2013 [facility] Patient Information 2015 [place], [place]. This information is not intended to replace advice given to you by your health care provider. Make sure you discuss any questions you have with your health care provider.

## 2014-12-17 NOTE — ED Notes (Signed)
Per c/o right calf pain for three days. Calf is not warm and red, walking helps the pain. Pt reports a calf strain four months ago. Pt denies any long distance travel, denies smoking, denies birth control, denies hormone replacement.

## 2014-12-18 ENCOUNTER — Ambulatory Visit (INDEPENDENT_AMBULATORY_CARE_PROVIDER_SITE_OTHER): Payer: No Typology Code available for payment source | Admitting: Family Medicine

## 2014-12-18 VITALS — BP 135/66 | HR 101 | Temp 99.5°F | Resp 16 | Ht 65.0 in | Wt 222.0 lb

## 2014-12-18 DIAGNOSIS — I10 Essential (primary) hypertension: Secondary | ICD-10-CM

## 2014-12-18 DIAGNOSIS — R7303 Prediabetes: Secondary | ICD-10-CM

## 2014-12-18 DIAGNOSIS — I82409 Acute embolism and thrombosis of unspecified deep veins of unspecified lower extremity: Secondary | ICD-10-CM | POA: Insufficient documentation

## 2014-12-18 DIAGNOSIS — R7309 Other abnormal glucose: Secondary | ICD-10-CM

## 2014-12-18 DIAGNOSIS — Z7901 Long term (current) use of anticoagulants: Secondary | ICD-10-CM

## 2014-12-18 DIAGNOSIS — B20 Human immunodeficiency virus [HIV] disease: Secondary | ICD-10-CM

## 2014-12-18 DIAGNOSIS — N939 Abnormal uterine and vaginal bleeding, unspecified: Secondary | ICD-10-CM

## 2014-12-18 DIAGNOSIS — Z21 Asymptomatic human immunodeficiency virus [HIV] infection status: Secondary | ICD-10-CM

## 2014-12-18 DIAGNOSIS — D649 Anemia, unspecified: Secondary | ICD-10-CM

## 2014-12-18 DIAGNOSIS — I82401 Acute embolism and thrombosis of unspecified deep veins of right lower extremity: Secondary | ICD-10-CM

## 2014-12-18 HISTORY — DX: Abnormal uterine and vaginal bleeding, unspecified: N93.9

## 2014-12-18 NOTE — Progress Notes (Addendum)
Subjective:    Patient ID: Wanda Kane, female    DOB: Sep 01, 1961, 54 y.o.   MRN: [id number]  HPI  [name], patient with a history of HIV, hypertension, and pre-diabetes  presents for follow-up of right calf DVT. Patient was evaluated in the emergency department on 12/17/2014 for right calf pain. She was diagnosed with a right calf DVT and started on Xarelto. She states that she has never smoked, no recent travel, and she is not on hormone replacement therapy. She currently denies headache, bleeding, fatigue, nausea, vomiting, and diarrhea.   Past Medical History  Diagnosis Date  . HIV infection   . Hypertension   . Fibroids   . Fibroid     Review of Systems  Constitutional: Negative.   HENT: Negative.   Eyes: Negative.   Respiratory: Negative.  Negative for cough, chest tightness and shortness of breath.   Cardiovascular: Negative.   Gastrointestinal: Negative.   Endocrine: Negative.   Genitourinary: Negative.   Musculoskeletal: Positive for myalgias (right leg pain).  Skin: Negative.   Allergic/Immunologic: Negative.   Neurological: Negative.   Hematological: Negative.   Psychiatric/Behavioral: Negative.        Objective:   Physical Exam  Constitutional: She is oriented to person, place, and time.  HENT:  Head: Normocephalic and atraumatic.  Right Ear: External ear normal.  Left Ear: External ear normal.  Mouth/Throat: Oropharynx is clear and moist.  Eyes: Conjunctivae are normal. Pupils are equal, round, and reactive to light.  Neck: Normal range of motion. Neck supple.  Cardiovascular: Normal rate, regular rhythm, normal heart sounds and intact distal pulses.   Pulmonary/Chest: Effort normal and breath sounds normal.  Abdominal: Soft. Bowel sounds are normal.  Musculoskeletal: Normal range of motion.       Right lower leg: She exhibits tenderness, swelling and edema (trace).  Neurological: She is alert and oriented to person, place, and time. She has  normal reflexes.  Skin: Skin is warm and dry.  Psychiatric: She has a normal mood and affect. Her behavior is normal. Judgment and thought content normal.      BP 135/66 mmHg  Pulse 101  Temp(Src) 99.5 F (37.5 C) (Oral)  Resp 16  Ht 5\' 5"  (1.651 m)  Wt 222 lb (100.699 kg)  BMI 36.94 kg/m2  LMP 11/29/2014 (Approximate)    Assessment & Plan:  1. Deep vein thrombosis (DVT), right Patient had an unprovoked DVT that was diagnosed in the emergency department on 12/17/2014. I reviewed previous laboratory values and risk factors for development of blood clots. Reviewed lower extremity venous duplex, which is consistent with acute deep vein thrombosis involving the posterior tibial and peroneal veins of the right lower extremity.  Notified pharmacist, Peggyann Juba, to inquire whether it is beneficial to order a hypercoagulable panel after starting Xarelto. She stated that it may not be beneficial at this point to order the panel. I will consult hematologist for further information.  Addendum: Suggest that test not be ordered at this point. However, patient is at risk for developing a clot in the future. Patient needs to be on anticoagulation therapy for 6 months.  2. Chronic anticoagulation Will continue on Xarelto BID for 22 days, will transition to daily. Patient will need to be on anti-coagulation therapy for 6 months. The [facility] DVT trial suggests 6 months of therapy for an unprovoked DVT.  Discussed side effects at length.   3. Essential hypertension Blood pressure is at goal on current medication regimen.  -  COMPLETE METABOLIC PANEL WITH GFR; Future 4. Pre-diabetes Patient states that she is on a lowfat, low carbohydrate diet. I will check hemoglobin A1C.  - Hemoglobin A1c; Future 5. HIV (human immunodeficiency virus infection) Controlled on HAART therapy, patient to follow up with Dr. Tommy Medal as previously scheduled.   6. Anemia, unspecified anemia type Current hemoglobin is 8.3,  I will check current  iron studies. She states that she recently re-started Ferrous sulfate 325 BID. Patient to continue current medication regimen.   - CBC with Differential; Future  RTC: Dr. Runell Gess in 1 month for  Follow up of DVT    Dorena Dew, FNP

## 2014-12-18 NOTE — Patient Instructions (Signed)
Rivaroxaban oral tablets °What is this medicine? °RIVAROXABAN (ri va ROX a ban) is an anticoagulant (blood thinner). It is used to treat blood clots in the lungs or in the veins. It is also used after knee or hip surgeries to prevent blood clots. It is also used to lower the chance of stroke in people with a medical condition called atrial fibrillation. °This medicine may be used for other purposes; ask your health care provider or pharmacist if you have questions. °COMMON BRAND NAME(S): Xarelto, Xarelto Starter Pack °What should I tell my health care provider before I take this medicine? °They need to know if you have any of these conditions: °-bleeding disorders °-bleeding in the brain °-blood in your stools (black or tarry stools) or if you have blood in your vomit °-history of stomach bleeding °-kidney disease °-liver disease °-low blood counts, like low white cell, platelet, or red cell counts °-recent or planned spinal or epidural procedure °-take medicines that treat or prevent blood clots °-an unusual or allergic reaction to rivaroxaban, other medicines, foods, dyes, or preservatives °-pregnant or trying to get pregnant °-breast-feeding °How should I use this medicine? °Take this medicine by mouth with a glass of water. Follow the directions on the prescription label. Take your medicine at regular intervals. Do not take it more often than directed. Do not stop taking except on your doctor's advice. Stopping this medicine may increase your risk of a blot clot. Be sure to refill your prescription before you run out of medicine. °If you are taking this medicine after hip or knee replacement surgery, take it with or without food. If you are taking this medicine for atrial fibrillation, take it with your evening meal. If you are taking this medicine to treat blood clots, take it with food at the same time each day. If you are unable to swallow your tablet, you may crush the tablet and mix it in applesauce. Then,  immediately eat the applesauce. You should eat more food right after you eat the applesauce containing the crushed tablet. °Talk to your pediatrician regarding the use of this medicine in children. Special care may be needed. °Overdosage: If you think you have taken too much of this medicine contact a poison control center or emergency room at once. °NOTE: This medicine is only for you. Do not share this medicine with others. °What if I miss a dose? °If you take your medicine once a day and miss a dose, take the missed dose as soon as you remember. If you take your medicine twice a day and miss a dose, take the missed dose immediately. In this instance, 2 tablets may be taken at the same time. The next day you should take 1 tablet twice a day as directed. °What may interact with this medicine? °-aspirin and aspirin-like medicines °-certain antibiotics like erythromycin, azithromycin, and clarithromycin °-certain medicines for fungal infections like ketoconazole and itraconazole °-certain medicines for irregular heart beat like amiodarone, quinidine, dronedarone °-certain medicines for seizures like carbamazepine, phenytoin °-certain medicines that treat or prevent blood clots like warfarin, enoxaparin, and dalteparin °-conivaptan °-diltiazem °-felodipine °-indinavir °-lopinavir; ritonavir °-NSAIDS, medicines for pain and inflammation, like ibuprofen or naproxen °-ranolazine °-rifampin °-ritonavir °-St. John's wort °-verapamil °This list may not describe all possible interactions. Give your health care provider a list of all the medicines, herbs, non-prescription drugs, or dietary supplements you use. Also tell them if you smoke, drink alcohol, or use illegal drugs. Some items may interact with your medicine. °What   should I watch for while using this medicine? °Visit your doctor or health care professional for regular checks on your progress. Your condition will be monitored carefully while you are receiving this  medicine. °Notify your doctor or health care professional and seek emergency treatment if you develop breathing problems; changes in vision; chest pain; severe, sudden headache; pain, swelling, warmth in the leg; trouble speaking; sudden numbness or weakness of the face, arm, or leg. These can be signs that your condition has gotten worse. °If you are going to have surgery, tell your doctor or health care professional that you are taking this medicine. °Tell your health care professional that you use this medicine before you have a spinal or epidural procedure. Sometimes people who take this medicine have bleeding problems around the spine when they have a spinal or epidural procedure. This bleeding is very rare. If you have a spinal or epidural procedure while on this medicine, call your health care professional immediately if you have back pain, numbness or tingling (especially in your legs and feet), muscle weakness, paralysis, or loss of bladder or bowel control. °Avoid sports and activities that might cause injury while you are using this medicine. Severe falls or injuries can cause unseen bleeding. Be careful when using sharp tools or knives. Consider using an electric razor. Take special care brushing or flossing your teeth. Report any injuries, bruising, or red spots on the skin to your doctor or health care professional. °What side effects may I notice from receiving this medicine? °Side effects that you should report to your doctor or health care professional as soon as possible: °-allergic reactions like skin rash, itching or hives, swelling of the face, lips, or tongue °-back pain °-redness, blistering, peeling or loosening of the skin, including inside the mouth °-signs and symptoms of bleeding such as bloody or black, tarry stools; red or dark-brown urine; spitting up blood or brown material that looks like coffee grounds; red spots on the skin; unusual bruising or bleeding from the eye, gums, or  nose °Side effects that usually do not require medical attention (Report these to your doctor or health care professional if they continue or are bothersome.): °-dizziness °-muscle pain °This list may not describe all possible side effects. Call your doctor for medical advice about side effects. You may report side effects to FDA at 1-800-FDA-1088. °Where should I keep my medicine? °Keep out of the reach of children. °Store at room temperature between 15 and 30 degrees C (59 and 86 degrees F). Throw away any unused medicine after the expiration date. °NOTE: This sheet is a summary. It may not cover all possible information. If you have questions about this medicine, talk to your doctor, pharmacist, or health care provider. °© 2015, [employer]. (2014-02-23 18:47:48) ° °

## 2014-12-19 ENCOUNTER — Encounter: Payer: Self-pay | Admitting: Family Medicine

## 2014-12-20 ENCOUNTER — Encounter: Payer: Self-pay | Admitting: Family Medicine

## 2014-12-20 ENCOUNTER — Encounter: Payer: Self-pay | Admitting: Obstetrics & Gynecology

## 2014-12-20 ENCOUNTER — Telehealth: Payer: Self-pay | Admitting: Family Medicine

## 2014-12-20 ENCOUNTER — Encounter (HOSPITAL_COMMUNITY): Payer: Self-pay | Admitting: *Deleted

## 2014-12-20 ENCOUNTER — Inpatient Hospital Stay (HOSPITAL_COMMUNITY)
Admission: AD | Admit: 2014-12-20 | Discharge: 2014-12-20 | Disposition: A | Discharge status: Home / Self Care | Payer: No Typology Code available for payment source | Source: Ambulatory Visit | Attending: Obstetrics & Gynecology | Admitting: Obstetrics & Gynecology

## 2014-12-20 ENCOUNTER — Telehealth: Payer: Self-pay | Admitting: *Deleted

## 2014-12-20 DIAGNOSIS — N939 Abnormal uterine and vaginal bleeding, unspecified: Secondary | ICD-10-CM

## 2014-12-20 DIAGNOSIS — I10 Essential (primary) hypertension: Secondary | ICD-10-CM | POA: Insufficient documentation

## 2014-12-20 DIAGNOSIS — Z21 Asymptomatic human immunodeficiency virus [HIV] infection status: Secondary | ICD-10-CM | POA: Insufficient documentation

## 2014-12-20 DIAGNOSIS — Z86718 Personal history of other venous thrombosis and embolism: Secondary | ICD-10-CM | POA: Insufficient documentation

## 2014-12-20 DIAGNOSIS — Z7901 Long term (current) use of anticoagulants: Secondary | ICD-10-CM | POA: Insufficient documentation

## 2014-12-20 LAB — CBC WITH DIFFERENTIAL/PLATELET
Basophils Absolute: 0.1 10*3/uL (ref 0.0–0.1)
Basophils Relative: 0 % (ref 0–1)
EOS ABS: 0.1 10*3/uL (ref 0.0–0.7)
Eosinophils Relative: 1 % (ref 0–5)
HCT: 26.5 % — ABNORMAL LOW (ref 36.0–46.0)
Hemoglobin: 8.2 g/dL — ABNORMAL LOW (ref 12.0–15.0)
Lymphocytes Relative: 31 % (ref 12–46)
Lymphs Abs: 5 10*3/uL — ABNORMAL HIGH (ref 0.7–4.0)
MCH: 25.2 pg — AB (ref 26.0–34.0)
MCHC: 30.9 g/dL (ref 30.0–36.0)
MCV: 81.3 fL (ref 78.0–100.0)
MONOS PCT: 6 % (ref 3–12)
Monocytes Absolute: 0.9 10*3/uL (ref 0.1–1.0)
Neutro Abs: 9.9 10*3/uL — ABNORMAL HIGH (ref 1.7–7.7)
Neutrophils Relative %: 62 % (ref 43–77)
Platelets: 464 10*3/uL — ABNORMAL HIGH (ref 150–400)
RBC: 3.26 MIL/uL — ABNORMAL LOW (ref 3.87–5.11)
RDW: 18.8 % — AB (ref 11.5–15.5)
WBC: 15.9 10*3/uL — ABNORMAL HIGH (ref 4.0–10.5)

## 2014-12-20 MED ORDER — MEGESTROL ACETATE 40 MG PO TABS: ORAL_TABLET | ORAL | Status: DC

## 2014-12-20 NOTE — Telephone Encounter (Signed)
Patient will require anticoagulation therapy for 6 months. She is scheduled to return to clinic for follow up in 1 month with Dr. Zigmund Daniel.    Dorena Dew, FNP

## 2014-12-20 NOTE — MAU Note (Signed)
Pt states that she started on Xarelto on Sunday for a blood clot. Since then she has had blood clots and a regular flow which has become increasingly heavier. Pt states that at times she is changing her pad every hour because of the clots. Pt denies pain and says she is having slight cramping with headache.

## 2014-12-20 NOTE — Telephone Encounter (Addendum)
Pt left message stating that she has just been diagnosed with a DVT and started on Xarelto. She is concerned because she is peri-menopausal and has Hx of very heavy periods and passing clots during her period. Her clinic appt is not until 3/9 but she wants to know what to do since bleeding cannot be stopped when taking Xarelto.  2/4  1005  Per chart review, pt was seen @ MAU last pm due to heavy vaginal bleeding.  She was given Rx for Megace and plan is for urgent clinic follow up on 2/5.

## 2014-12-20 NOTE — MAU Provider Note (Signed)
History     CSN: [id number]  Arrival date and time: 12/20/14 2047   First Provider Initiated Contact with Patient 12/20/14 2110      Chief Complaint  Patient presents with  . Vaginal Bleeding   HPI  [name] is a 54 y.o. G1P1001 who presents to MAU today with complaint of 2 days of heavy vaginal bleeding with clots. The patient states that she was diagnosed with DVT on Sunday and started on Xerelto. She first noted spotting on Monday and then progressed to heavier bleeding since Tuesday morning. The patient states LMP of 11/27/14. She has a history of heavy periods due to fibroids, but states usually regular. The patient states that she is passing moderate to large clots. She denies abdominal pain. She endorses mild fatigue, but denies weakness, dizziness or SOB.   OB History    Gravida Para Term Preterm AB TAB SAB Ectopic Multiple Living   1 1 1  0 0 0 0 0 0 1      Past Medical History  Diagnosis Date  . HIV infection   . Hypertension   . Fibroids   . Fibroid     Past Surgical History  Procedure Laterality Date  . Cesarean section  1983  . Tonsillectomy and adenoidectomy    . Cesarean section      Family History  Problem Relation Age of Onset  . Hypertension Mother   . CAD Brother   . Heart disease Brother 60    cardiac arrest    History  Substance Use Topics  . Smoking status: Never Smoker   . Smokeless tobacco: Never Used  . Alcohol Use: No    Allergies:  Allergies  Allergen Reactions  . Viread [Tenofovir]     Rhabdo    Prescriptions prior to admission  Medication Sig Dispense Refill Last Dose  . amLODipine (NORVASC) 10 MG tablet TAKE 1 TABLET BY MOUTH EVERY DAY 30 tablet 5 12/20/2014 at Unknown time  . B-Complex-C TABS Take 1 tablet by mouth daily.   12/20/2014 at 1600  . dolutegravir (TIVICAY) 50 MG tablet Take 1 tablet (50 mg total) by mouth daily. 30 tablet 5 12/20/2014 at 0800  . ferrous sulfate 325 (65 FE) MG EC tablet Take 1 tablet (325 mg  total) by mouth 2 (two) times daily before a meal. 60 tablet 11 12/20/2014 at 0800  . lamivudine (EPIVIR) 300 MG tablet Take 1 tablet (300 mg total) by mouth daily. 30 tablet 5 12/20/2014 at Unknown time  . lisinopril (PRINIVIL,ZESTRIL) 10 MG tablet TAKE 1 TABLET BY MOUTH EVERY DAY 30 tablet 5 12/20/2014 at Unknown time  . rilpivirine (EDURANT) 25 MG TABS tablet Take 1 tablet (25 mg total) by mouth daily with breakfast. 30 tablet 5 12/20/2014 at 1200  . spironolactone (ALDACTONE) 100 MG tablet Take 1 tablet (100 mg total) by mouth daily. 30 tablet 6 12/20/2014 at Unknown time  . Calcium Carbonate-Vitamin D (CALTRATE 600+D) 600-400 MG-UNIT per tablet Take 1 tablet by mouth daily. (Patient not taking: Reported on 10/26/2014) 30 tablet 1 Not Taking at Unknown time  . docusate sodium (COLACE) 100 MG capsule Take 1 capsule (100 mg total) by mouth 2 (two) times daily as needed. 30 capsule 2 Taking  . XARELTO STARTER PACK 15 & 20 MG TBPK Take 15-20 mg by mouth as directed. Take as directed on package: Start with one 15mg  tablet by mouth twice a day with food. On Day 22, switch to one 20mg  tablet once  a day with food. 51 each 0 Taking    Review of Systems  Constitutional: Negative for fever and malaise/fatigue.  Gastrointestinal: Negative for abdominal pain.  Genitourinary:       + vaginal bleeding   Physical Exam   Blood pressure 111/65, pulse 93, temperature 99.8 F (37.7 C), temperature source Oral, resp. rate 16, height 5\' 5"  (1.651 m), weight 221 lb 6 oz (100.415 kg), last menstrual period 11/27/2014, SpO2 100 %.  Physical Exam  Constitutional: She is oriented to person, place, and time. She appears well-developed and well-nourished. No distress.  HENT:  Head: Normocephalic.  Cardiovascular: Normal rate.   Respiratory: Effort normal.  GI: Soft. She exhibits no distension and no mass. There is no tenderness. There is no rebound and no guarding.  Genitourinary: There is bleeding (moderate amount of  blood noted with 2 large clots, 2 fox swabs used and bleeding light from cervical os) in the vagina.  Neurological: She is alert and oriented to person, place, and time.  Skin: Skin is warm and dry. No erythema.  Psychiatric: She has a normal mood and affect.   Results for orders placed or performed during the hospital encounter of 12/20/14 (from the past 24 hour(s))  CBC with Differential/Platelet     Status: Abnormal   Collection Time: 12/20/14  9:16 PM  Result Value Ref Range   WBC 15.9 (H) 4.0 - 10.5 K/uL   RBC 3.26 (L) 3.87 - 5.11 MIL/uL   Hemoglobin 8.2 (L) 12.0 - 15.0 g/dL   HCT 26.5 (L) 36.0 - 46.0 %   MCV 81.3 78.0 - 100.0 fL   MCH 25.2 (L) 26.0 - 34.0 pg   MCHC 30.9 30.0 - 36.0 g/dL   RDW 18.8 (H) 11.5 - 15.5 %   Platelets 464 (H) 150 - 400 K/uL   Neutrophils Relative % 62 43 - 77 %   Neutro Abs 9.9 (H) 1.7 - 7.7 K/uL   Lymphocytes Relative 31 12 - 46 %   Lymphs Abs 5.0 (H) 0.7 - 4.0 K/uL   Monocytes Relative 6 3 - 12 %   Monocytes Absolute 0.9 0.1 - 1.0 K/uL   Eosinophils Relative 1 0 - 5 %   Eosinophils Absolute 0.1 0.0 - 0.7 K/uL   Basophils Relative 0 0 - 1 %   Basophils Absolute 0.1 0.0 - 0.1 K/uL    MAU Course  Procedures None  MDM CBC today is stable from 3 days ago Discussed with Dr. Elonda Husky. Megace still appropriate for AUB with Xerelto. Patient should follow-up in [place] and with PCP for management of Xerelto  Assessment and Plan  A: AUB Recent DVT on Xerelto  P: Discharge home Rx for Megace given to patient Bleeding precautions discussed Urgent follow-up in [facility] 12/22/14 with Dr. Harolyn Rutherford at 9:00 am Patient may return to MAU as needed or if her condition were to change or worsen   Luvenia Redden, PA-C  12/20/2014, 11:02 PM

## 2014-12-20 NOTE — Discharge Instructions (Signed)
Abnormal Uterine Bleeding Abnormal uterine bleeding can affect women at various stages in life, including teenagers, women in their reproductive years, pregnant women, and women who have reached menopause. Several kinds of uterine bleeding are considered abnormal, including:  Bleeding or spotting between periods.   Bleeding after sexual intercourse.   Bleeding that is heavier or more than normal.   Periods that last longer than usual.  Bleeding after menopause.  Many cases of abnormal uterine bleeding are minor and simple to treat, while others are more serious. Any type of abnormal bleeding should be evaluated by your health care provider. Treatment will depend on the cause of the bleeding. HOME CARE INSTRUCTIONS Monitor your condition for any changes. The following actions may help to alleviate any discomfort you are experiencing:  Avoid the use of tampons and douches as directed by your health care provider.  Change your pads frequently. You should get regular pelvic exams and Pap tests. Keep all follow-up appointments for diagnostic tests as directed by your health care provider.  SEEK MEDICAL CARE IF:   Your bleeding lasts more than 1 week.   You feel dizzy at times.  SEEK IMMEDIATE MEDICAL CARE IF:   You pass out.   You are changing pads every 15 to 30 minutes.   You have abdominal pain.  You have a fever.   You become sweaty or weak.   You are passing large blood clots from the vagina.   You start to feel nauseous and vomit. MAKE SURE YOU:   Understand these instructions.  Will watch your condition.  Will get help right away if you are not doing well or get worse. Document Released: 11/03/2005 Document Revised: 11/08/2013 Document Reviewed: 06/02/2013 [facility] Patient Information 2015 [place], [place]. This information is not intended to replace advice given to you by your health care provider. Make sure you discuss any questions you have with your  health care provider. Anticoagulation, Generic Anticoagulants are medicines used to prevent clots from developing in your veins. These medicine are also known as blood thinners. If blood clots are untreated, they could travel to your lungs. This is called a pulmonary embolus. A blood clot in your lungs can be fatal.  Health care providers often use anticoagulants to prevent clots following surgery. Anticoagulants are also used along with aspirin when the heart is not getting enough blood. Another anticoagulant called warfarin is started 2 to 3 days after a rapid-acting injectable anticoagulant is started. The rapid-acting anticoagulants are usually continued until warfarin has begun to work. Your health care provider will judge this length of time by blood tests known as the prothrombin time (PT) and International Normalization Ratio (INR). This means that your blood is at the necessary and best level to prevent clots. RISKS AND COMPLICATIONS  If you have received recent epidural anesthesia, spinal anesthesia, or a spinal tap while receiving anticoagulants, you are at risk for developing a blood clot in or around the spine. This condition could result in long-term or permanent paralysis.  Because anticoagulants thin your blood, severe bleeding may occur from any tissue or organ. Symptoms of the blood being too thin may include:  Bleeding from the nose or gums that does not stop quickly.  Blood in bowel movements which may appear as bright red, dark, or black tarry stools.  Blood in the urine which may appear as pink, red, or brown urine.  Unusual bruising or bruising easily.  A cut that does not stop bleeding within 10 minutes.  Vomiting blood or continuous nausea for more than 1 day.  Coughing up blood.  Broken blood vessels in your eye (subconjunctival hemorrhage).  Abdominal or back pain with or without flank bruising.  Sudden, severe headache.  Sudden weakness or numbness of the  face, arm, or leg, especially on one side of the body.  Sudden confusion.  Trouble speaking (aphasia) or understanding.  Sudden trouble seeing in one or both eyes.  Sudden trouble walking.  Dizziness.  Loss of balance or coordination.  Vaginal bleeding.  Swelling or pain at an injection site.  Superficial fat tissue death (necrosis) which may cause skin scarring. This is more common in women and may first present as pain in the waist, thighs, or buttocks.  Fever.  Too little anticoagulation continues to allow the risk for blood clots. HOME CARE INSTRUCTIONS   Due to the complications of anticoagulants, it is very important that you take your anticoagulant as directed by your health care provider. Anticoagulants need to be taken exactly as instructed. Be sure you understand all your anticoagulant instructions.  Keep all follow-up appointments with your health care provider as directed. It is very important to keep your appointments. Not keeping appointments could result in a chronic or permanent injury, pain, or disability.  Warfarin. Your health care provider will advise you on the length of treatment (usually 3-6 months, sometimes lifelong).  Take warfarin exactly as directed by your health care provider. It is recommended that you take your warfarin dose at the same time of the day. It is preferred that you take warfarin in the late afternoon. If you have been told to stop taking warfarin, do not resume taking warfarin until directed to do so by your health care provider. Follow your health care provider's instructions if you accidentally take an extra dose or miss a dose of warfarin. It is very important to take warfarin as directed since bleeding or blood clots could result in chronic or permanent injury, pain, or disability.  Too much and too little warfarin are both dangerous. Too much warfarin increases the risk of bleeding. Too little warfarin continues to allow the risk for  blood clots. While taking warfarin, you will need to have regular blood tests to measure your blood clotting time. These blood tests usually include both the prothrombin time (PT) and International Normalized Ratio (INR) tests. The PT and INR results allow your health care provider to adjust your dose of warfarin. The dose can change for many reasons. It is critically important that you have your PT and INR levels drawn exactly as directed. Your warfarin dose may stay the same or change depending on what the PT and INR results are. Be sure to follow up with your health care provider regarding your PT and INR test results and what your warfarin dosage should be.  Many medicines can interfere with warfarin and affect the PT and INR results. You must tell your health care provider about any and all medicines you take, this includes all vitamins and supplements. Ask your health care provider before taking these. Prescription and over-the-counter medicine consistency is critical to warfarin management. It is important that potential interactions are checked before you start a new medicine. Be especially cautious with aspirin and anti-inflammatory medicines. Ask your health care provider before taking these. Medicines such as antibiotics and acid-reducing medicine can interact with warfarin and can cause an increased warfarin effect. Warfarin can also interfere with the effectiveness of medicines you are taking. Do not take  or discontinue any prescribed or over-the-counter medicine except on the advice of your health care provider or pharmacist.  Some vitamins, supplements, and herbal products interfere with the effectiveness of warfarin. Vitamin E may increase the anticoagulant effects of warfarin. Vitamin K may can cause warfarin to be less effective. Do not take or discontinue any vitamin, supplement, or herbal product except on the advice of your health care provider or pharmacist.  Eat what you normally eat and  keep the vitamin K content of your diet consistent. Avoid major changes in your diet, or notify your health care provider before changing your diet. Suddenly getting a lot more vitamin K could cause your blood to clot too quickly. A sudden decrease in vitamin K intake could cause your blood to clot too slowly. These changes in vitamin K intake could lead to dangerous blood clotsor to bleeding. To keep your vitamin K intake consistent, you must be aware of which foods contain moderate or high amounts of vitamin K. Some foods high in vitamin K include spinach, kale, broccoli, cabbage, greens, Brussels sprouts, asparagus, Bok [place]oy, coleslaw, parsley, and green tea. Arrange a visit with a dietitian to answer your questions.  If you have a loss of appetite or get the stomach flu (viral gastroenteritis), talk to your health care provider as soon as possible. A decrease in your normal vitamin K intake can make you more sensitive to your usual dose of warfarin.  Some medical conditions may increase your risk for bleeding while you are taking warfarin. A fever, diarrhea lasting more than a day, worsening heart failure, or worsening liver function are some medical conditions that could affect warfarin. Contact your health care provider if you have any of these medical conditions.  Alcohol can change the body's ability to handle warfarin. It is best to avoid alcoholic drinks or consume only very small amounts while taking warfarin. Notify your health care provider if you change your alcohol intake. A sudden increase in alcohol use can increase your risk of bleeding. Chronic alcohol use can cause warfarin to be less effective.  Be careful not to cut yourself when using sharp objects or while shaving.  Inform all your health care providers and your dentist that you take an anticoagulant.  Limit physical activities or sports that could result in a fall or cause injury. Avoid contact sports.  Wear medical alert  jewelry or carry a medical alert card. SEEK IMMEDIATE MEDICAL CARE IF:  You cough up blood.  You have dark or black stools or there is bright red blood coming from your rectum.  You vomit blood or have nausea for more than 1 day.  You have blood in the urine or pink colored urine.  You have unusual bruising or have increased bruising.  You have bleeding from the nose or gums that does not stop quickly.  You have a cut that does not stop bleeding within a 2-3 minutes.  You have sudden weakness or numbness of the face, arm, or leg, especially on one side of the body.  You have sudden confusion.  You have trouble speaking (aphasia) or understanding.  You have sudden trouble seeing in one or both eyes.  You have sudden trouble walking.  You have dizziness.  You have a loss of balance or coordination.  You have a sudden, severe headache.  You have a serious fall or head injury, even if you are not bleeding.  You have swelling or pain at an injection site.  You  have unexplained tenderness or pain in the abdomen, back, waist, thighs or buttocks.  You have a fever. Any of these symptoms may represent a serious problem that is an emergency. Do not wait to see if the symptoms will go away. Get medical help right away. Call your local emergency services (911 in [place] Do not drive yourself to the hospital. Document Released: 11/03/2005 Document Revised: 11/08/2013 Document Reviewed: 06/07/2008 [facility] Patient Information 2015 [place], [place]. This information is not intended to replace advice given to you by your health care provider. Make sure you discuss any questions you have with your health care provider.

## 2014-12-20 NOTE — MAU Note (Signed)
Vaginal bleeding since Monday; had an episode of bleeding 1.5 wks before that. Started on Xarelto on Sunday night for DVT. Saturating 1 pad per hour; switched from tampons because of the clots. Has used 6 pads since this morning. Denies dizziness. Headache since Sunday night.

## 2014-12-21 ENCOUNTER — Encounter: Payer: Self-pay | Admitting: Family Medicine

## 2014-12-21 ENCOUNTER — Ambulatory Visit: Payer: No Typology Code available for payment source | Attending: Internal Medicine

## 2014-12-22 ENCOUNTER — Encounter: Payer: Self-pay | Admitting: Obstetrics & Gynecology

## 2014-12-22 ENCOUNTER — Ambulatory Visit (INDEPENDENT_AMBULATORY_CARE_PROVIDER_SITE_OTHER): Payer: Self-pay | Admitting: Obstetrics & Gynecology

## 2014-12-22 VITALS — BP 122/64 | HR 104 | Temp 98.8°F | Wt 217.8 lb

## 2014-12-22 DIAGNOSIS — D689 Coagulation defect, unspecified: Secondary | ICD-10-CM

## 2014-12-22 DIAGNOSIS — T457X1A Poisoning by anticoagulant antagonists, vitamin K and other coagulants, accidental (unintentional), initial encounter: Principal | ICD-10-CM

## 2014-12-22 DIAGNOSIS — D65 Disseminated intravascular coagulation [defibrination syndrome]: Secondary | ICD-10-CM

## 2014-12-22 MED ORDER — MEGESTROL ACETATE 40 MG PO TABS: 80.0000 mg | ORAL_TABLET | Freq: Every day | ORAL | Status: DC

## 2014-12-22 NOTE — Patient Instructions (Signed)
Return to clinic for any scheduled appointments or for any gynecologic concerns as needed.   

## 2014-12-22 NOTE — Progress Notes (Signed)
   CLINIC ENCOUNTER NOTE  History:  54 y.o. G1P1001 here today for discussion of management of AUB in the setting of being on Xarelto for recently diagnosed DVT. Was seen on MAU on 12/20/14 for same complaint and prescribed Megace which helped reduce amount of bleeding.  No other symptoms.   The following portions of the patient's history were reviewed and updated as appropriate: allergies, current medications, past family history, past medical history, past social history, past surgical history and problem list. Normal pap and negative HRHPV on 06/09/13.  Normal mammogram on 07/06/14.   Review of Systems:  Pertinent items are noted in HPI.  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  She wants to continue Megace for now, 80 mg po daily prescribed with option of increasing dosage if needed.  Bleeding precautions reviewed.  Routine preventative health maintenance measures emphasized.   Verita Schneiders, MD, [place] [facility]ending [place] for [employer], [place]

## 2014-12-28 ENCOUNTER — Ambulatory Visit: Payer: Self-pay | Attending: Family Medicine | Admitting: Family Medicine

## 2014-12-28 ENCOUNTER — Encounter: Payer: Self-pay | Admitting: Family Medicine

## 2014-12-28 DIAGNOSIS — B20 Human immunodeficiency virus [HIV] disease: Secondary | ICD-10-CM | POA: Insufficient documentation

## 2014-12-28 DIAGNOSIS — Z86718 Personal history of other venous thrombosis and embolism: Secondary | ICD-10-CM | POA: Insufficient documentation

## 2014-12-28 DIAGNOSIS — I82401 Acute embolism and thrombosis of unspecified deep veins of right lower extremity: Secondary | ICD-10-CM

## 2014-12-28 DIAGNOSIS — D649 Anemia, unspecified: Secondary | ICD-10-CM

## 2014-12-28 DIAGNOSIS — D259 Leiomyoma of uterus, unspecified: Secondary | ICD-10-CM | POA: Insufficient documentation

## 2014-12-28 DIAGNOSIS — Z7901 Long term (current) use of anticoagulants: Secondary | ICD-10-CM | POA: Insufficient documentation

## 2014-12-28 LAB — CBC
HEMATOCRIT: 22.2 % — AB (ref 36.0–46.0)
Hemoglobin: 6.9 g/dL — CL (ref 12.0–15.0)
MCH: 25.3 pg — AB (ref 26.0–34.0)
MCHC: 31.1 g/dL (ref 30.0–36.0)
MCV: 81.3 fL (ref 78.0–100.0)
MPV: 10.2 fL (ref 8.6–12.4)
Platelets: 565 10*3/uL — ABNORMAL HIGH (ref 150–400)
RBC: 2.73 MIL/uL — ABNORMAL LOW (ref 3.87–5.11)
RDW: 20.6 % — ABNORMAL HIGH (ref 11.5–15.5)
WBC: 10.4 10*3/uL (ref 4.0–10.5)

## 2014-12-28 MED ORDER — RIVAROXABAN 20 MG PO TABS: 20.0000 mg | ORAL_TABLET | Freq: Every day | ORAL | Status: DC

## 2014-12-28 NOTE — Assessment & Plan Note (Signed)
1. DVT in R lower leg.  Continue xarelto.  Drop off Rx for 20 mg xarelto at the pharmacy Repeat CBC today. Continue megace as needed Continue iron twice daily with orange juice.   You will be called with CBC results.  F/u in 3 months. Repeat R lower extremity doppler will be ordered and scheduled right before 3 month follow up visit.

## 2014-12-28 NOTE — Patient Instructions (Addendum)
Ms. Leblond,  Thank you for coming in today. It was a pleasure meeting you. I look forward to being your primary doctor.   1. DVT in R lower leg.  Continue xarelto.  Drop off Rx for 20 mg xarelto at the pharmacy Repeat CBC today. Continue megace as needed Continue iron twice daily with orange juice.   You will be called with CBC results.  F/u in 3 months. Repeat R lower extremity doppler will be ordered and scheduled right before 3 month follow up visit.   Dr. Adrian Blackwater

## 2014-12-28 NOTE — Progress Notes (Signed)
Patient is here to establish care with you Patient dx with right lower leg DVT 2 weeks ago and was started on Xarelto Patient has had vaccinations

## 2014-12-28 NOTE — Progress Notes (Signed)
   Subjective:    Patient ID: Wanda Kane, female    DOB: 1960-12-18, 54 y.o.   MRN: [id number] CC: establish care, recent DVT HPI 54 yo F with HIV established with ID clinic presents to establish care:  1. Recent DVT: R posterior tibial and peroneal DVT dx on 12/17/14. Started on xarelto. Developed heavy uterine bleeding. Went to [facility] and now on megace. Still bleeding but very light now. No history of leg injury or immobilization. No hormone replacement or OCPs. Does reports calf pain 3 months prior, improved. Also distal leg pain one week prior, improved. Foot swelling day of diagnosis.   Soc Hx: non smoker  Med Hx: HIV and uterine fibroids Fam Hx: negative for PE or blood clot  Review of Systems As per HPI  GAD-7: score of 0    Objective:   Physical Exam BP 122/82 mmHg  Pulse 103  Temp(Src) 98 F (36.7 C)  Resp 16  Ht 5\' 5"  (1.651 m)  Wt 220 lb (99.791 kg)  BMI 36.61 kg/m2  SpO2 100%  LMP 11/30/2014 General appearance: alert, cooperative and no distress Lungs: clear to auscultation bilaterally Heart: regular rate and rhythm, S1, S2 normal, no murmur, click, rub or gallop Extremities: extremities normal, atraumatic, no cyanosis or edema     Assessment & Plan:

## 2014-12-29 ENCOUNTER — Encounter (HOSPITAL_COMMUNITY): Payer: Self-pay | Admitting: Family Medicine

## 2014-12-29 ENCOUNTER — Telehealth: Payer: Self-pay | Admitting: Emergency Medicine

## 2014-12-29 ENCOUNTER — Other Ambulatory Visit: Payer: Self-pay | Admitting: Interventional Radiology

## 2014-12-29 ENCOUNTER — Inpatient Hospital Stay (HOSPITAL_COMMUNITY)
Admission: EM | Admit: 2014-12-29 | Discharge: 2014-12-30 | DRG: 749 | Disposition: A | Discharge status: Home / Self Care | Payer: Self-pay | Attending: Internal Medicine | Admitting: Internal Medicine

## 2014-12-29 DIAGNOSIS — Z21 Asymptomatic human immunodeficiency virus [HIV] infection status: Secondary | ICD-10-CM | POA: Diagnosis present

## 2014-12-29 DIAGNOSIS — I10 Essential (primary) hypertension: Secondary | ICD-10-CM | POA: Diagnosis present

## 2014-12-29 DIAGNOSIS — Z7901 Long term (current) use of anticoagulants: Secondary | ICD-10-CM

## 2014-12-29 DIAGNOSIS — B20 Human immunodeficiency virus [HIV] disease: Secondary | ICD-10-CM | POA: Diagnosis present

## 2014-12-29 DIAGNOSIS — D259 Leiomyoma of uterus, unspecified: Principal | ICD-10-CM | POA: Diagnosis present

## 2014-12-29 DIAGNOSIS — I82401 Acute embolism and thrombosis of unspecified deep veins of right lower extremity: Secondary | ICD-10-CM | POA: Diagnosis present

## 2014-12-29 DIAGNOSIS — I82409 Acute embolism and thrombosis of unspecified deep veins of unspecified lower extremity: Secondary | ICD-10-CM | POA: Diagnosis present

## 2014-12-29 DIAGNOSIS — D62 Acute posthemorrhagic anemia: Secondary | ICD-10-CM | POA: Diagnosis present

## 2014-12-29 DIAGNOSIS — D219 Benign neoplasm of connective and other soft tissue, unspecified: Secondary | ICD-10-CM | POA: Diagnosis present

## 2014-12-29 DIAGNOSIS — D649 Anemia, unspecified: Secondary | ICD-10-CM

## 2014-12-29 DIAGNOSIS — N939 Abnormal uterine and vaginal bleeding, unspecified: Secondary | ICD-10-CM | POA: Diagnosis present

## 2014-12-29 LAB — CBC WITH DIFFERENTIAL/PLATELET
Basophils Absolute: 0 10*3/uL (ref 0.0–0.1)
Basophils Relative: 0 % (ref 0–1)
EOS PCT: 1 % (ref 0–5)
Eosinophils Absolute: 0.1 10*3/uL (ref 0.0–0.7)
HCT: 23.5 % — ABNORMAL LOW (ref 36.0–46.0)
HEMOGLOBIN: 7.1 g/dL — AB (ref 12.0–15.0)
Lymphocytes Relative: 28 % (ref 12–46)
Lymphs Abs: 2.8 10*3/uL (ref 0.7–4.0)
MCH: 24.7 pg — ABNORMAL LOW (ref 26.0–34.0)
MCHC: 30.2 g/dL (ref 30.0–36.0)
MCV: 81.9 fL (ref 78.0–100.0)
MONOS PCT: 6 % (ref 3–12)
Monocytes Absolute: 0.6 10*3/uL (ref 0.1–1.0)
Neutro Abs: 6.6 10*3/uL (ref 1.7–7.7)
Neutrophils Relative %: 65 % (ref 43–77)
Platelets: 516 10*3/uL — ABNORMAL HIGH (ref 150–400)
RBC: 2.87 MIL/uL — ABNORMAL LOW (ref 3.87–5.11)
RDW: 19.8 % — ABNORMAL HIGH (ref 11.5–15.5)
WBC: 10.1 10*3/uL (ref 4.0–10.5)

## 2014-12-29 LAB — BASIC METABOLIC PANEL
Anion gap: 9 (ref 5–15)
BUN: 7 mg/dL (ref 6–23)
CO2: 18 mmol/L — AB (ref 19–32)
CREATININE: 0.74 mg/dL (ref 0.50–1.10)
Calcium: 9.1 mg/dL (ref 8.4–10.5)
Chloride: 109 mmol/L (ref 96–112)
GFR calc Af Amer: >90 mL/min (ref >90)
GFR calc non Af Amer: >90 mL/min (ref >90)
GLUCOSE: 110 mg/dL — AB (ref 70–99)
Potassium: 3.9 mmol/L (ref 3.5–5.1)
Sodium: 136 mmol/L (ref 135–145)

## 2014-12-29 LAB — PROTIME-INR
INR: 2.12 — ABNORMAL HIGH (ref 0.00–1.49)
Prothrombin Time: 23.9 seconds — ABNORMAL HIGH (ref 11.6–15.2)

## 2014-12-29 LAB — TROPONIN I

## 2014-12-29 LAB — PREPARE RBC (CROSSMATCH)

## 2014-12-29 LAB — ABO/RH: ABO/RH(D): O POS

## 2014-12-29 MED ORDER — SODIUM CHLORIDE 0.9 % IV SOLN: 10.0000 mL/h | Freq: Once | INTRAVENOUS | Status: DC

## 2014-12-29 MED ORDER — SODIUM CHLORIDE 0.9 % IV SOLN
INTRAVENOUS | Status: DC
  Administered 2014-12-29: 13:00:00 via INTRAVENOUS
  Administered 2014-12-30: 1000 mL via INTRAVENOUS

## 2014-12-29 MED ORDER — RILPIVIRINE HCL 25 MG PO TABS
25.0000 mg | ORAL_TABLET | Freq: Every day | ORAL | Status: DC
  Filled 2014-12-29 (×2): qty 1

## 2014-12-29 MED ORDER — DOLUTEGRAVIR SODIUM 50 MG PO TABS
50.0000 mg | ORAL_TABLET | Freq: Every day | ORAL | Status: DC
  Administered 2014-12-29: 50 mg via ORAL
  Filled 2014-12-29 (×3): qty 1

## 2014-12-29 MED ORDER — SODIUM CHLORIDE 0.9 % IV SOLN: 250.0000 mL | INTRAVENOUS | Status: DC | PRN

## 2014-12-29 MED ORDER — SODIUM CHLORIDE 0.9 % IJ SOLN: 3.0000 mL | Freq: Two times a day (BID) | INTRAMUSCULAR | Status: DC

## 2014-12-29 MED ORDER — SODIUM CHLORIDE 0.9 % IJ SOLN: 3.0000 mL | INTRAMUSCULAR | Status: DC | PRN

## 2014-12-29 MED ORDER — FERROUS SULFATE 325 (65 FE) MG PO TBEC: 325.0000 mg | DELAYED_RELEASE_TABLET | Freq: Two times a day (BID) | ORAL | Status: DC

## 2014-12-29 MED ORDER — MEGESTROL ACETATE 40 MG PO TABS
80.0000 mg | ORAL_TABLET | Freq: Every day | ORAL | Status: DC
  Filled 2014-12-29: qty 2

## 2014-12-29 MED ORDER — SODIUM CHLORIDE 0.9 % IV BOLUS (SEPSIS)
500.0000 mL | Freq: Once | INTRAVENOUS | Status: AC
  Administered 2014-12-29: 500 mL via INTRAVENOUS

## 2014-12-29 MED ORDER — SPIRONOLACTONE 100 MG PO TABS
100.0000 mg | ORAL_TABLET | Freq: Every day | ORAL | Status: DC
  Administered 2014-12-29 – 2014-12-30 (×2): 100 mg via ORAL
  Filled 2014-12-29 (×3): qty 1

## 2014-12-29 MED ORDER — ACETAMINOPHEN 650 MG RE SUPP: 650.0000 mg | Freq: Four times a day (QID) | RECTAL | Status: DC | PRN

## 2014-12-29 MED ORDER — LAMIVUDINE 150 MG PO TABS: 300.0000 mg | ORAL_TABLET | Freq: Every day | ORAL | Status: DC

## 2014-12-29 MED ORDER — ACETAMINOPHEN 325 MG PO TABS: 650.0000 mg | ORAL_TABLET | Freq: Four times a day (QID) | ORAL | Status: DC | PRN

## 2014-12-29 MED ORDER — AMLODIPINE BESYLATE 10 MG PO TABS
10.0000 mg | ORAL_TABLET | Freq: Every day | ORAL | Status: DC
  Administered 2014-12-30: 10 mg via ORAL
  Filled 2014-12-29: qty 1

## 2014-12-29 MED ORDER — LISINOPRIL 10 MG PO TABS
10.0000 mg | ORAL_TABLET | Freq: Every day | ORAL | Status: DC
  Administered 2014-12-29 – 2014-12-30 (×2): 10 mg via ORAL
  Filled 2014-12-29 (×3): qty 1

## 2014-12-29 MED ORDER — SODIUM CHLORIDE 0.9 % IV SOLN
INTRAVENOUS | Status: AC
  Administered 2014-12-29: 19:00:00 via INTRAVENOUS

## 2014-12-29 MED ORDER — FERROUS SULFATE 325 (65 FE) MG PO TABS
325.0000 mg | ORAL_TABLET | Freq: Two times a day (BID) | ORAL | Status: DC
  Administered 2014-12-29 – 2014-12-30 (×2): 325 mg via ORAL
  Filled 2014-12-29 (×4): qty 1

## 2014-12-29 NOTE — ED Notes (Signed)
Patient being transported by [place], [place] to [place].

## 2014-12-29 NOTE — ED Provider Notes (Signed)
CSN: [id number]     Arrival date & time 12/29/14  1019 History   First MD Initiated Contact with Patient 12/29/14 1021     Chief Complaint  Patient presents with  . Abnormal Lab      HPI Pt was seen at 1035. Per pt, c/o gradual onset and worsening of persistent generalized weakness/fatigue for the past several days. Pt states she was started on xarelto last month for dx DVT RLE. Pt states she began having progressive heavy vaginal bleeding after starting it; so she was evaluated at [facility] was dx megace with improvement in her vaginal bleeding. Pt was evaluated by her PMD yesterday, and had her H/H re-checked. Pt states she was called today and told to come to the ED for evaluation/admission due to "low hemoglobin level" (6.9). Pt denies CP/palpitations, no SOB/cough, no abd pain, no N/V/D, no hematuria, no blood in stools, no obvious other sites of bleeding.    ID pt Past Medical History  Diagnosis Date  . HIV infection   . Fibroids   . Fibroid   . DVT (deep venous thrombosis)   . Hypertension 1994  . Deep vein thrombophlebitis of right leg     2016   Past Surgical History  Procedure Laterality Date  . Cesarean section  1983  . Tonsillectomy and adenoidectomy    . Cesarean section     Family History  Problem Relation Age of Onset  . Hypertension Mother   . CAD Brother   . Heart disease Brother 61    cardiac arrest   History  Substance Use Topics  . Smoking status: Never Smoker   . Smokeless tobacco: Never Used  . Alcohol Use: No   OB History    Gravida Para Term Preterm AB TAB SAB Ectopic Multiple Living   1 1 1  0 0 0 0 0 0 1     Review of Systems ROS: Statement: All systems negative except as marked or noted in the HPI; Constitutional: Negative for fever and chills. +generalized fatigue/weakness.; ; Eyes: Negative for eye pain, redness and discharge. ; ; ENMT: Negative for ear pain, hoarseness, nasal congestion, sinus pressure and sore throat. ; ;  Cardiovascular: Negative for chest pain, palpitations, diaphoresis, dyspnea and peripheral edema. ; ; Respiratory: Negative for cough, wheezing and stridor. ; ; Gastrointestinal: Negative for nausea, vomiting, diarrhea, abdominal pain, blood in stool, hematemesis, jaundice and rectal bleeding. . ; ; Genitourinary: Negative for dysuria, flank pain and hematuria. ; ; GYN:  +vaginal bleeding, no vaginal discharge, no vulvar pain. ;; Musculoskeletal: Negative for back pain and neck pain. Negative for swelling and trauma.; ; Skin: Negative for pruritus, rash, abrasions, blisters, bruising and skin lesion.; ; Neuro: Negative for headache, lightheadedness and neck stiffness. Negative for altered level of consciousness , altered mental status, extremity weakness, paresthesias, involuntary movement, seizure and syncope.      Allergies  Viread  Home Medications   Prior to Admission medications   Medication Sig Start Date End Date Taking? Authorizing Provider  amLODipine (NORVASC) 10 MG tablet TAKE 1 TABLET BY MOUTH EVERY DAY 08/22/14  Yes Truman Hayward, MD  B-Complex-C TABS Take 1 tablet by mouth daily.   Yes Historical Provider, MD  dolutegravir (TIVICAY) 50 MG tablet Take 1 tablet (50 mg total) by mouth daily. 10/18/14  Yes Truman Hayward, MD  ferrous sulfate 325 (65 FE) MG EC tablet Take 1 tablet (325 mg total) by mouth 2 (two) times daily  before a meal. 10/17/13  Yes Truman Hayward, MD  lamivudine (EPIVIR) 300 MG tablet Take 1 tablet (300 mg total) by mouth daily. 10/18/14  Yes Truman Hayward, MD  lisinopril (PRINIVIL,ZESTRIL) 10 MG tablet TAKE 1 TABLET BY MOUTH EVERY DAY 08/22/14  Yes Truman Hayward, MD  megestrol (MEGACE) 40 MG tablet Take 2 tablets (80 mg total) by mouth daily. Can increase to 3 tablets daily if needed for heavy bleeding 12/22/14  Yes Osborne Oman, MD  rilpivirine (EDURANT) 25 MG TABS tablet Take 1 tablet (25 mg total) by mouth daily with breakfast. 10/18/14  Yes  Truman Hayward, MD  spironolactone (ALDACTONE) 100 MG tablet Take 1 tablet (100 mg total) by mouth daily. 05/29/14  Yes Truman Hayward, MD  XARELTO STARTER PACK 15 & 20 MG TBPK Take 15-20 mg by mouth as directed. Take as directed on package: Start with one 15mg  tablet by mouth twice a day with food. On Day 22, switch to one 20mg  tablet once a day with food. 12/17/14  Yes Tatyana A Kirichenko, PA-C  Calcium Carbonate-Vitamin D (CALTRATE 600+D) 600-400 MG-UNIT per tablet Take 1 tablet by mouth daily. Patient not taking: Reported on 12/22/2014 07/13/14   Dorena Dew, FNP  docusate sodium (COLACE) 100 MG capsule Take 1 capsule (100 mg total) by mouth 2 (two) times daily as needed. Patient not taking: Reported on 12/22/2014 12/19/13   Osborne Oman, MD  rivaroxaban (XARELTO) 20 MG TABS tablet Take 1 tablet (20 mg total) by mouth daily with supper. Patient not taking: Reported on 12/29/2014 12/28/14   Lennox Laity Funches, MD   BP 105/54 mmHg  Pulse 92  Temp(Src) 98.6 F (37 C) (Oral)  Resp 24  SpO2 100%  LMP 12/18/2014 Physical Exam  1040; Physical examination:  Nursing notes reviewed; Vital signs and O2 SAT reviewed;  Constitutional: Well developed, Well nourished, Well hydrated, In no acute distress; Head:  Normocephalic, atraumatic; Eyes: EOMI, PERRL, No scleral icterus. Conjunctiva pale.; ENMT: Mouth and pharynx normal, Mucous membranes moist; Neck: Supple, Full range of motion, No lymphadenopathy; Cardiovascular: Tachycardic rate and rhythm, No gallop; Respiratory: Breath sounds clear & equal bilaterally, No wheezes.  Speaking full sentences with ease, Normal respiratory effort/excursion; Chest: Nontender, Movement normal; Abdomen: Soft, Nontender, Nondistended, Normal bowel sounds; Genitourinary: No CVA tenderness; Extremities: Pulses normal, No tenderness, No edema, No calf edema or asymmetry.; Neuro: AA&Ox3, Major CN grossly intact.  Speech clear. No gross focal motor or sensory deficits in  extremities.; Skin: Color normal, Warm, Dry.   ED Course  Procedures     EKG Interpretation   Date/Time:  Friday December 29 2014 10:41:01 EST Ventricular Rate:  105 PR Interval:  137 QRS Duration: 92 QT Interval:  346 QTC Calculation: 457 R Axis:   -8 Text Interpretation:  Sinus tachycardia No old tracing to compare  Confirmed by [facility]  MD, Nunzio Cory [phone]) on 12/29/2014 11:28:17 AM      MDM  MDM Reviewed: previous chart, nursing note and vitals Reviewed previous: labs and ECG Interpretation: labs and ECG Total time providing critical care: 30-74 minutes. This excludes time spent performing separately reportable procedures and services. Consults: admitting MD     CRITICAL CARE Performed by: Alfonzo Feller Total critical care time: 35 Critical care time was exclusive of separately billable procedures and treating other patients. Critical care was necessary to treat or prevent imminent or life-threatening deterioration. Critical care was time spent personally by me  on the following activities: development of treatment plan with patient and/or surrogate as well as nursing, discussions with consultants, evaluation of patient's response to treatment, examination of patient, obtaining history from patient or surrogate, ordering and performing treatments and interventions, ordering and review of laboratory studies, ordering and review of radiographic studies, pulse oximetry and re-evaluation of patient's condition.   Results for orders placed or performed during the hospital encounter of 05/12/93  Basic metabolic panel  Result Value Ref Range   Sodium 136 135 - 145 mmol/L   Potassium 3.9 3.5 - 5.1 mmol/L   Chloride 109 96 - 112 mmol/L   CO2 18 (L) 19 - 32 mmol/L   Glucose, Bld 110 (H) 70 - 99 mg/dL   BUN 7 6 - 23 mg/dL   Creatinine, Ser 0.74 0.50 - 1.10 mg/dL   Calcium 9.1 8.4 - 10.5 mg/dL   GFR calc non Af Amer >90 >90 mL/min   GFR calc Af Amer >90 >90 mL/min    Anion gap 9 5 - 15  CBC with Differential  Result Value Ref Range   WBC 10.1 4.0 - 10.5 K/uL   RBC 2.87 (L) 3.87 - 5.11 MIL/uL   Hemoglobin 7.1 (L) 12.0 - 15.0 g/dL   HCT 23.5 (L) 36.0 - 46.0 %   MCV 81.9 78.0 - 100.0 fL   MCH 24.7 (L) 26.0 - 34.0 pg   MCHC 30.2 30.0 - 36.0 g/dL   RDW 19.8 (H) 11.5 - 15.5 %   Platelets 516 (H) 150 - 400 K/uL   Neutrophils Relative % 65 43 - 77 %   Neutro Abs 6.6 1.7 - 7.7 K/uL   Lymphocytes Relative 28 12 - 46 %   Lymphs Abs 2.8 0.7 - 4.0 K/uL   Monocytes Relative 6 3 - 12 %   Monocytes Absolute 0.6 0.1 - 1.0 K/uL   Eosinophils Relative 1 0 - 5 %   Eosinophils Absolute 0.1 0.0 - 0.7 K/uL   Basophils Relative 0 0 - 1 %   Basophils Absolute 0.0 0.0 - 0.1 K/uL  Protime-INR  Result Value Ref Range   Prothrombin Time 23.9 (H) 11.6 - 15.2 seconds   INR 2.12 (H) 0.00 - 1.49  Troponin I  Result Value Ref Range   Troponin I <0.03 <0.031 ng/mL  Type and screen  Result Value Ref Range   ABO/RH(D) O POS    Antibody Screen NEG    Sample Expiration 01/01/2015    Unit Number [id number]    Blood Component Type RED CELLS,LR    Unit division 00    Status of Unit ALLOCATED    Transfusion Status OK TO TRANSFUSE    Crossmatch Result Compatible    Unit Number [id number]    Blood Component Type RED CELLS,LR    Unit division 00    Status of Unit ALLOCATED    Transfusion Status OK TO TRANSFUSE    Crossmatch Result Compatible   ABO/Rh  Result Value Ref Range   ABO/RH(D) O POS   Prepare RBC  Result Value Ref Range   Order Confirmation ORDER PROCESSED BY BLOOD BANK     1235:  Pt with symptomatic anemia; will start PRBC transfusion. Will dose judicious IVF for SBP 90-100's while awaiting transfusion. Dx and testing d/w pt.  Questions answered.  Verb understanding, agreeable to admit.   T/C to [facility] [place] Pink, case discussed, including:  HPI, pertinent PM/SHx, VS/PE, dx testing, ED course and treatment:  Agreeable to admit, requests to  write  temporary orders, obtain medical bed to team [facility].     Francine Graven, DO 12/30/14 1547

## 2014-12-29 NOTE — ED Notes (Signed)
Blood Administration consent signed and with paper chart.

## 2014-12-29 NOTE — ED Notes (Signed)
Report given to Hannah RN

## 2014-12-29 NOTE — ED Notes (Signed)
Attempted Report x1.   

## 2014-12-29 NOTE — Telephone Encounter (Signed)
Pt returned call regarding instructions to ER Pt states she took already took morning dose Xarelto States she will go to ER now

## 2014-12-29 NOTE — H&P (Signed)
History and Physical  Wanda Kane MVH:[id number] DOB: 09-Jul-1961 DOA: 12/29/2014  Referring physician: Lurene Kane, ER physician PCP: Wanda Ends, MD   Chief Complaint: Vaginal bleeding  HPI: Wanda Kane is a 54 y.o. female  Past history of HIV and recently diagnosed DVT less than 2 weeks ago started on xarelto and started experiencing vaginal bleeding the very next day. This continued to persist heavily and patient saw her OB/GYN. Megace was attempted in order to slow the bleeding. Bleeding was felt to be secondary to uterine fibroids as patient had this previous problem in the past. It had since been quiet and she became perimenopausal, that now that she was on anticoagulation had resumed. Megace unfortunately did not improve this and patient presented today after she saw her PCP yesterday and was told her hemoglobin was low at 6.9. This was confirmed in the emergency room and patient was given 1 unit packed red blood cells. Hospitalists were called for further evaluation and admission.   Review of Systems:  Patient seen in the emergency room. Pt complains of vaginal bleeding earlier, it has since stopped.  Pt denies any headaches, vision changes, dysphagia, chest pain, palpitations, shortness of breath, wheeze, cough, abdominal pain, hematuria, dysuria, constipation, diarrhea, focal extremity numbness or weakness or pain.  Review of systems are otherwise negative  Past Medical History  Diagnosis Date  . HIV infection   . Fibroids   . Fibroid   . DVT (deep venous thrombosis)   . Hypertension 1994  . Deep vein thrombophlebitis of right leg     2016  . DVT (deep venous thrombosis)    Past Surgical History  Procedure Laterality Date  . Cesarean section  1983  . Tonsillectomy and adenoidectomy    . Cesarean section     Social History:  reports that she has never smoked. She has never used smokeless tobacco. She reports that she does not drink alcohol or use illicit  drugs. Patient lives at home by herself & is able to participate in activities of daily living with out assistance  Allergies  Allergen Reactions  . Viread [Tenofovir] Other (See Comments)    Rhabdomyolysis    Family History  Problem Relation Age of Onset  . Hypertension Mother   . CAD Brother   . Heart disease Brother 17    cardiac arrest    As reviewed with patient  Prior to Admission medications   Medication Sig Start Date End Date Taking? Authorizing Provider  amLODipine (NORVASC) 10 MG tablet TAKE 1 TABLET BY MOUTH EVERY DAY 08/22/14  Yes Truman Hayward, MD  B-Complex-C TABS Take 1 tablet by mouth daily.   Yes Historical Provider, MD  dolutegravir (TIVICAY) 50 MG tablet Take 1 tablet (50 mg total) by mouth daily. 10/18/14  Yes Truman Hayward, MD  ferrous sulfate 325 (65 FE) MG EC tablet Take 1 tablet (325 mg total) by mouth 2 (two) times daily before a meal. 10/17/13  Yes Truman Hayward, MD  lamivudine (EPIVIR) 300 MG tablet Take 1 tablet (300 mg total) by mouth daily. 10/18/14  Yes Truman Hayward, MD  lisinopril (PRINIVIL,ZESTRIL) 10 MG tablet TAKE 1 TABLET BY MOUTH EVERY DAY 08/22/14  Yes Truman Hayward, MD  megestrol (MEGACE) 40 MG tablet Take 2 tablets (80 mg total) by mouth daily. Can increase to 3 tablets daily if needed for heavy bleeding 12/22/14  Yes Osborne Oman, MD  rilpivirine (EDURANT) 25 MG TABS  tablet Take 1 tablet (25 mg total) by mouth daily with breakfast. 10/18/14  Yes Truman Hayward, MD  spironolactone (ALDACTONE) 100 MG tablet Take 1 tablet (100 mg total) by mouth daily. 05/29/14  Yes Truman Hayward, MD    Physical Exam: BP 135/62 mmHg  Pulse 101  Temp(Src) 99.3 F (37.4 C) (Oral)  Resp 16  Ht 5\' 5"  (1.651 m)  Wt 99.791 kg (220 lb)  BMI 36.61 kg/m2  SpO2 100%  LMP 12/18/2014  General:  Alert and oriented 3, no acute distress Eyes: Sclera nonicteric, extraocular movements are intact ENT: Normocephalic, atraumatic,  mucous members are moist Neck: No carotid bruits Cardiovascular: Borderline tachycardia, regular rate and rhythm Respiratory: Clear to auscultation bilaterally Abdomen: Soft, nontender, nondistended, positive bowel sounds Skin: No skin breaks, tears or lesions Musculoskeletal: No clubbing or cyanosis or edema Psychiatric: Patient is appropriate, no evidence of psychoses Neurologic: No focal deficits           Labs on Admission:  Basic Metabolic Panel:  Recent Labs Lab 12/29/14 1026  NA 136  K 3.9  CL 109  CO2 18*  GLUCOSE 110*  BUN 7  CREATININE 0.74  CALCIUM 9.1   Liver Function Tests: No results for input(s): AST, ALT, ALKPHOS, BILITOT, PROT, ALBUMIN in the last 168 hours. No results for input(s): LIPASE, AMYLASE in the last 168 hours. No results for input(s): AMMONIA in the last 168 hours. CBC:  Recent Labs Lab 12/28/14 1201 12/29/14 1026  WBC 10.4 10.1  NEUTROABS  --  6.6  HGB 6.9* 7.1*  HCT 22.2* 23.5*  MCV 81.3 81.9  PLT 565* 516*   Cardiac Enzymes:  Recent Labs Lab 12/29/14 1026  TROPONINI <0.03    BNP (last 3 results) No results for input(s): BNP in the last 8760 hours.  ProBNP (last 3 results) No results for input(s): PROBNP in the last 8760 hours.   EKG: Independently reviewed. Sinus tachycardia  Assessment/Plan Present on Admission:   . Vaginal bleeding causing acute blood loss anemia secondary to uterine fibroids while on Xarelto. Unfortunately, no weight to continue anticoagulation. Decreasing dose would only lead to poor protection. Spoke with patient and she is amenable to plan. Have stopped Xarelto. Status post 1 unit packed red blood cells and emergency room. Interventional radiology will see patient for placement of IVC filter. Patient will follow-up with OB after for plans for fibroid removal  . HIV infection: Stable, viral count undetectable. Continue antiretrovirals   . DVT (deep venous thrombosis): In discussion with patient,  DVT caused by leg injury and being relatively immobile several months ago. Patient should be fine with filter  . Hypertension: Stable  Consultants: Interventional radiology  Code Status: Full code  Family Communication: No family present   Disposition Plan: Likely home in next 1-2 days once filter has been placed and hemoglobin remained stable  Time spent: 35 minutes  [facility] Pager [phone]

## 2014-12-29 NOTE — ED Notes (Signed)
EMT clicked off order for patient signature process for consent form in error.  Signed consent was obtained by this RN.

## 2014-12-29 NOTE — ED Notes (Addendum)
Pt here for low hemoglobin from home.  Pt reports was started on Xarelto December 17 2014, began having vaginal bleeding Dec 18, 2014 and saw her gynecologist and PCP at that time. Was seen Wednesday Evening at [facility] and was given Megestrol Acetate 40mg  to stop the vaginal bleeding.  Pt was seen at [facility] yesterday and was called this morning with results of Hemoglobin of 6.9 and was instructed to come to the ED today. Pt took Xarelto and Iron this morning. Dr. Thurnell Garbe at bedside.

## 2014-12-29 NOTE — Progress Notes (Signed)
NURSING PROGRESS NOTE  Garnell Begeman [id number] Admission Data: 12/29/2014 6:00 PM Attending Provider: Annita Brod, MD QQI:[id number], Lennox Laity, MD Code Status: Full  Allergies:  Viread Past Medical History:   has a past medical history of HIV infection; Fibroids; Fibroid; DVT (deep venous thrombosis); Hypertension (1994); Deep vein thrombophlebitis of right leg; and DVT (deep venous thrombosis). Past Surgical History:   has past surgical history that includes Cesarean section (1983); Tonsillectomy and adenoidectomy; and Cesarean section. Social History:   reports that she has never smoked. She has never used smokeless tobacco. She reports that she does not drink alcohol or use illicit drugs.  Purva Vessell is a 54 y.o. female patient admitted from ED:   Last Documented Vital Signs: Blood pressure 138/68, pulse 101, temperature 99.3 F (37.4 C), temperature source Oral, resp. rate 16, last menstrual period 12/18/2014, SpO2 100 %.  Cardiac Monitoring: No Tele ordered   IV Fluids:  IV in place, occlusive dsg intact without redness, IV cath wrist left, condition patent and no redness normal saline.   Skin: intact   Patient orientated to room. Information packet given to patien. Admission inpatient armband information verified with patient to include name and date of birth and placed on patient arm. Side rails up x 2, fall assessment and education completed with patien. Patient able to verbalize understanding of risk associated with falls and verbalized understanding to call for assistance before getting out of bed. Call light within reach. Patient able to voice and demonstrate understanding of unit orientation instructions.    Will continue to evaluate and treat per MD orders.   Hendricks Limes RN, BS, BSN

## 2014-12-29 NOTE — Discharge Planning (Signed)
CARE MANAGEMENT NOTE 12/29/2014  Patient:  Wanda Kane, Wanda Kane   Account Number:  [id number]  Date Initiated:  12/29/2014  Documentation initiated by:  [facility]  Subjective/Objective Assessment:   Anemia; Hgb 6.9//     Action/Plan:   PRBC transfusion. Will dose judicious IVF for SBP 90-100's while awaiting transfusion   Anticipated DC Date:  12/31/2014   Anticipated DC Plan:  HOME/SELF CARE         Choice offered to / List presented to:             Status of service:  In process, will continue to follow Medicare Important Message given?   (If response is "NO", the following Medicare IM given date fields will be blank) Date Medicare IM given:   Medicare IM given by:   Date Additional Medicare IM given:   Additional Medicare IM given by:    Discharge Disposition:    Per UR Regulation:  Reviewed for med. necessity/level of care/duration of stay  If discussed at [place] of Stay Meetings, dates discussed:    Comments:  12/29/14 Fuller Mandril, RN, BSN, NCM [phone] Pt was seen at 1035. Per pt, c/o gradual onset and worsening of persistent generalized weakness/fatigue for the past several days. Pt states she was started on xarelto last month for dx DVT RLE. Pt states she began having progressive heavy vaginal bleeding after starting it; so she was evaluated at [place] was dx megace with improvement in her vaginal bleeding. Pt was evaluated by her PMD yesterday, and had her H/H re-checked. Pt states she was called today and told to come to the ED for evaluation/admission due to "low hemoglobin level" (6.9).

## 2014-12-29 NOTE — Telephone Encounter (Signed)
Critical lab Hgb of 6.7  Patient established care yesterday.  R distal DVT on xarelto, hx of uterine fibroids, heavy menstrual bleeding on megace.  Instructions: Stop xarelto Patient to present directly to ED for Hgb recheck and possible blood transfusion.

## 2014-12-29 NOTE — Telephone Encounter (Signed)
Attempted to reach pt home number listed to instruct her to go to ER for critical Hemoglobin level called in from [facility] Pt needs to stop Xarelto now and go to ER per Dr. Adrian Blackwater Will try to reach pt again

## 2014-12-30 ENCOUNTER — Inpatient Hospital Stay (HOSPITAL_COMMUNITY): Payer: Self-pay

## 2014-12-30 LAB — BASIC METABOLIC PANEL
ANION GAP: 5 (ref 5–15)
BUN: <5 mg/dL — ABNORMAL LOW (ref 6–23)
CHLORIDE: 107 mmol/L (ref 96–112)
CO2: 24 mmol/L (ref 19–32)
Calcium: 8.5 mg/dL (ref 8.4–10.5)
Creatinine, Ser: 0.7 mg/dL (ref 0.50–1.10)
GFR calc Af Amer: >90 mL/min (ref >90)
GFR calc non Af Amer: >90 mL/min (ref >90)
Glucose, Bld: 99 mg/dL (ref 70–99)
POTASSIUM: 4 mmol/L (ref 3.5–5.1)
SODIUM: 136 mmol/L (ref 135–145)

## 2014-12-30 LAB — CBC
HCT: 23.6 % — ABNORMAL LOW (ref 36.0–46.0)
HEMOGLOBIN: 7.2 g/dL — AB (ref 12.0–15.0)
MCH: 24.8 pg — ABNORMAL LOW (ref 26.0–34.0)
MCHC: 30.5 g/dL (ref 30.0–36.0)
MCV: 81.4 fL (ref 78.0–100.0)
Platelets: 495 10*3/uL — ABNORMAL HIGH (ref 150–400)
RBC: 2.9 MIL/uL — AB (ref 3.87–5.11)
RDW: 18.7 % — ABNORMAL HIGH (ref 11.5–15.5)
WBC: 9 10*3/uL (ref 4.0–10.5)

## 2014-12-30 LAB — PROTIME-INR
INR: 1.22 (ref 0.00–1.49)
Prothrombin Time: 15.6 seconds — ABNORMAL HIGH (ref 11.6–15.2)

## 2014-12-30 MED ORDER — MIDAZOLAM HCL 2 MG/2ML IJ SOLN
INTRAMUSCULAR | Status: AC | PRN
  Administered 2014-12-30: 0.5 mg via INTRAVENOUS

## 2014-12-30 MED ORDER — HYDROCODONE-ACETAMINOPHEN 5-325 MG PO TABS: 1.0000 | ORAL_TABLET | ORAL | Status: DC | PRN

## 2014-12-30 MED ORDER — FENTANYL CITRATE 0.05 MG/ML IJ SOLN
INTRAMUSCULAR | Status: AC | PRN
  Administered 2014-12-30: 25 ug via INTRAVENOUS

## 2014-12-30 MED ORDER — LIDOCAINE HCL 1 % IJ SOLN
INTRAMUSCULAR | Status: AC
  Filled 2014-12-30: qty 20

## 2014-12-30 MED ORDER — IOHEXOL 300 MG/ML  SOLN
100.0000 mL | Freq: Once | INTRAMUSCULAR | Status: AC | PRN
  Administered 2014-12-30: 50 mL via INTRAVENOUS

## 2014-12-30 MED ORDER — MIDAZOLAM HCL 2 MG/2ML IJ SOLN
INTRAMUSCULAR | Status: AC
  Filled 2014-12-30: qty 2

## 2014-12-30 MED ORDER — FENTANYL CITRATE 0.05 MG/ML IJ SOLN
INTRAMUSCULAR | Status: AC
  Filled 2014-12-30: qty 2

## 2014-12-30 NOTE — Plan of Care (Signed)
Problem: Phase I Progression Outcomes Goal: Initial discharge plan identified Outcome: Completed/Met Date Met:  12/30/14 To return home

## 2014-12-30 NOTE — Progress Notes (Signed)
[name] discharged Home per MD order.  Discharge instructions reviewed and discussed with the patient, all questions and concerns answered. Copy of instructions given to patient.    Medication List    TAKE these medications        amLODipine 10 MG tablet  Commonly known as:  NORVASC  TAKE 1 TABLET BY MOUTH EVERY DAY     B-Complex-C Tabs  Take 1 tablet by mouth daily.     dolutegravir 50 MG tablet  Commonly known as:  TIVICAY  Take 1 tablet (50 mg total) by mouth daily.     ferrous sulfate 325 (65 FE) MG EC tablet  Take 1 tablet (325 mg total) by mouth 2 (two) times daily before a meal.     lamivudine 300 MG tablet  Commonly known as:  EPIVIR  Take 1 tablet (300 mg total) by mouth daily.     lisinopril 10 MG tablet  Commonly known as:  PRINIVIL,ZESTRIL  TAKE 1 TABLET BY MOUTH EVERY DAY     megestrol 40 MG tablet  Commonly known as:  MEGACE  Take 2 tablets (80 mg total) by mouth daily. Can increase to 3 tablets daily if needed for heavy bleeding     rilpivirine 25 MG Tabs tablet  Commonly known as:  EDURANT  Take 1 tablet (25 mg total) by mouth daily with breakfast.     spironolactone 100 MG tablet  Commonly known as:  ALDACTONE  Take 1 tablet (100 mg total) by mouth daily.        Patients skin is clean, dry and intact, no evidence of skin break down. IV site discontinued and catheter remains intact. Site without signs and symptoms of complications. Dressing and pressure applied.  Patient escorted to car by RN in a wheelchair,  no distress noted upon discharge. Home medications return to pt.  Wynetta Emery, Josimar Corning C 12/30/2014 6:41 PM

## 2014-12-30 NOTE — Consult Note (Signed)
Reason for consult: IVC filter  Referring Physician(s): TRH  History of Present Illness: Wanda Kane is a 54 y.o. female with history of HIV and known uterine fibroids. She was recently diagnosed with RLE DVT (12/17/14) and started on xarelto but subsequently developed persistent vaginal bleeding/anemia. She has been transfused with PRBC's.. Request now received for IVC filter placement.  Past Medical History  Diagnosis Date  . HIV infection   . Fibroids   . Fibroid   . DVT (deep venous thrombosis)   . Hypertension 1994  . Deep vein thrombophlebitis of right leg     2016  . DVT (deep venous thrombosis)     Past Surgical History  Procedure Laterality Date  . Cesarean section  1983  . Tonsillectomy and adenoidectomy    . Cesarean section      Allergies: Viread  Medications: Prior to Admission medications   Medication Sig Start Date End Date Taking? Authorizing Provider  amLODipine (NORVASC) 10 MG tablet TAKE 1 TABLET BY MOUTH EVERY DAY 08/22/14  Yes Truman Hayward, MD  B-Complex-C TABS Take 1 tablet by mouth daily.   Yes Historical Provider, MD  dolutegravir (TIVICAY) 50 MG tablet Take 1 tablet (50 mg total) by mouth daily. 10/18/14  Yes Truman Hayward, MD  ferrous sulfate 325 (65 FE) MG EC tablet Take 1 tablet (325 mg total) by mouth 2 (two) times daily before a meal. 10/17/13  Yes Truman Hayward, MD  lamivudine (EPIVIR) 300 MG tablet Take 1 tablet (300 mg total) by mouth daily. 10/18/14  Yes Truman Hayward, MD  lisinopril (PRINIVIL,ZESTRIL) 10 MG tablet TAKE 1 TABLET BY MOUTH EVERY DAY 08/22/14  Yes Truman Hayward, MD  megestrol (MEGACE) 40 MG tablet Take 2 tablets (80 mg total) by mouth daily. Can increase to 3 tablets daily if needed for heavy bleeding 12/22/14  Yes Osborne Oman, MD  rilpivirine (EDURANT) 25 MG TABS tablet Take 1 tablet (25 mg total) by mouth daily with breakfast. 10/18/14  Yes Truman Hayward, MD  spironolactone (ALDACTONE)  100 MG tablet Take 1 tablet (100 mg total) by mouth daily. 05/29/14  Yes Truman Hayward, MD    Family History  Problem Relation Age of Onset  . Hypertension Mother   . CAD Brother   . Heart disease Brother 18    cardiac arrest    History   Social History  . Marital Status: Single    Spouse Name: N/A  . Number of Children: N/A  . Years of Education: N/A   Social History Main Topics  . Smoking status: Never Smoker   . Smokeless tobacco: Never Used  . Alcohol Use: No  . Drug Use: No  . Sexual Activity:    Partners: Male     Comment: patient declined condoms   Other Topics Concern  . None   Social History Narrative      Review of Systems neg except for above  Vital Signs: BP 131/66 mmHg  Pulse 87  Temp(Src) 98.4 F (36.9 C) (Oral)  Resp 18  Ht 5\' 5"  (1.651 m)  Wt 220 lb (99.791 kg)  BMI 36.61 kg/m2  SpO2 100%  LMP 12/18/2014  Physical Exam pt awake/alert; chest- CTA bilat; heart- RRR; abd-soft,+BS,NT; ext- FROM, no sig pretibial edema  Imaging: No results found.  Labs:  CBC:  Recent Labs  12/20/14 2116 12/28/14 1201 12/29/14 1026 12/30/14 0539  WBC 15.9* 10.4 10.1  9.0  HGB 8.2* 6.9* 7.1* 7.2*  HCT 26.5* 22.2* 23.5* 23.6*  PLT 464* 565* 516* 495*    COAGS:  Recent Labs  12/17/14 1718 12/29/14 1026  INR 1.12 2.12*    BMP:  Recent Labs  05/22/14 0917 09/29/14 1026 10/25/14 0947 12/13/14 0937 12/17/14 1727 12/29/14 1026 12/30/14 0539  NA 134* 136 139 135 139 136 136  K 3.8 4.4 4.3 4.3 3.8 3.9 4.0  CL 103 101 107 102 106 109 107  CO2 22 21 24 24   --  18* 24  GLUCOSE 83 89 94 89 78 110* 99  BUN 9 7 9 6 6 7  <5*  CALCIUM 8.8 9.7 10.0 9.1  --  9.1 8.5  CREATININE 0.81 0.89 0.81 0.94 0.80 0.74 0.70  GFRNONAA 83 74  --   --   --  >90 >90  GFRAA >89 86  --   --   --  >90 >90    LIVER FUNCTION TESTS:  Recent Labs  05/22/14 0917 09/29/14 1026 10/25/14 0947 12/13/14 0937  BILITOT 0.4 0.6 0.3 0.3  AST 20 18 20 20   ALT 14  11 12 10   ALKPHOS 66 81 82 79  PROT 7.2 7.1 7.2 6.9  ALBUMIN 4.3 4.0 4.3 4.1    TUMOR MARKERS: No results for input(s): AFPTM, CEA, CA199, CHROMGRNA in the last 8760 hours.  Assessment and Plan: Wanda Kane is a 54 y.o. female with history of HIV and known uterine fibroids. She was recently diagnosed with RLE DVT (12/17/14) and started on xarelto but subsequently developed persistent vaginal bleeding/anemia. She has been transfused with PRBC's. Request now received for IVC filter placement. Case reviewed by Dr. Vernard Gambles and details/risks of procedure d/w pt with her understanding and consent. Procedure planned for today.      Signed: Autumn Messing 12/30/2014, 11:28 AM   I spent a total of 20 minutes face to face in clinical consultation, greater than 50% of which was counseling/coordinating care for IVC filter placement.

## 2014-12-30 NOTE — Sedation Documentation (Signed)
Patient denies pain and is resting comfortably.  

## 2014-12-30 NOTE — Discharge Summary (Signed)
Physician Discharge Summary  Wanda Kane UXL:[id number] DOB: 25-Apr-1961 DOA: 12/29/2014  PCP: Minerva Ends, MD  Admit date: 12/29/2014 Discharge date: 12/30/2014  Recommendations for Outpatient Follow-up:  1. Pt will need to follow up with PCP in 2-3 weeks post discharge 2. Please obtain BMP to evaluate electrolytes and kidney function 3. Please also check CBC to evaluate Hg and Hct levels 4. Please note Xarelto stopped and pt had IVC filter placed   Discharge Diagnoses:  Principal Problem:   Acute blood loss anemia Active Problems:   Vaginal bleeding   HIV infection   Fibroids   DVT (deep venous thrombosis)   Hypertension  Discharge Condition: Stable  Diet recommendation: Heart healthy diet discussed in details   History of present illness:  54 y.o. female with HIV and recently diagnosed DVT less than 2 weeks ago started on xarelto and started experiencing vaginal bleeding the very next day. This continued to persist heavily and patient saw her OB/GYN. Megace was attempted in order to slow the bleeding. Bleeding was felt to be secondary to uterine fibroids as patient had this previous problem in the past. It had since been quiet and she became perimenopausal, She saw her PCP yesterday and was told her hemoglobin was low at 6.9. This was confirmed in the emergency room and patient was given 1 unit packed red blood cells. Hospitalists were called for further evaluation and admission.  Hospital Course:  Principal Problem:   Acute blood loss anemia - likely from uterine fibroids in the setting of AC - AC stopped and pt had IVC filter placed, wants to go home today  - appropriate increase in Hg post transfusion  Active Problems:   Vaginal bleeding - resolved per pt    HIV infection - stable    Fibroids - outpatient follow up   DVT (deep venous thrombosis) - s/p IVC filter placement 12/30/2014    Hypertension - reasonable inpatient control   Procedures/Studies: Ir Ivc  Filter Plmt / S&i /img Guid/mod Sed  12/30/2014   CLINICAL DATA:  Lower extremity DVT. Increased abnormal uterine bleeding requiring transfusion, a relative contraindication to anticoagulation. Caval filtration requested.  EXAM: INFERIOR VENACAVOGRAM  IVC FILTER PLACEMENT UNDER FLUOROSCOPY  FLUOROSCOPY TIME:  42 seconds  TECHNIQUE: The procedure, risks (including but not limited to bleeding, infection, organ damage ), benefits, and alternatives were explained to the patient. Questions regarding the procedure were encouraged and answered. The patient understands and consents to the procedure. Patency of the right IJ vein was confirmed with ultrasound with image documentation. An appropriate skin site was determined. Skin site was marked, prepped with chlorhexidine, and draped using maximum barrier technique. The region was infiltrated locally with 1% lidocaine.  Intravenous Fentanyl and Versed were administered as conscious sedation during continuous cardiorespiratory monitoring by the radiology RN, with a total moderate sedation time of ten minutes.  Under real-time ultrasound guidance, the right IJ vein was accessed with a 21 gauge micropuncture needle; the needle tip within the vein was confirmed with ultrasound image documentation. The needle was exchanged over a 018 guidewire for a transitional dilator, which allow advancement of the [facility] wire into the IVC. A long 6 French vascular sheath was placed for inferior venacavography. This demonstrated no caval thrombus. Renal vein inflows were evident.  The [facility] IVC filter was advanced through the sheath and successfully deployed under fluoroscopy at the L2 level. Followup cavagram demonstrates stable filter position and no evident complication. The sheath was removed and hemostasis achieved at  the site. No immediate complication.  IMPRESSION: 1. Normal IVC. No thrombus or significant anatomic variation. 2. Technically successful infrarenal IVC filter placement.  This is a retrievable model.   Electronically Signed   By: Lucrezia Europe M.D.   On: 12/30/2014 13:19   Discharge Exam: Filed Vitals:   12/30/14 1307  BP: 94/34  Pulse: 90  Temp:   Resp: 16   Filed Vitals:   12/30/14 1231 12/30/14 1240 12/30/14 1245 12/30/14 1307  BP: 95/54 103/39 93/28 94/34   Pulse: 92 93 90 90  Temp:      TempSrc:      Resp: 14 13 14 16   Height:      Weight:      SpO2: 100% 100% 100% 100%    General: Pt is alert, follows commands appropriately, not in acute distress Cardiovascular: Regular rate and rhythm, S1/S2 +, no murmurs, no rubs, no gallops Respiratory: Clear to auscultation bilaterally, no wheezing, no crackles, no rhonchi Abdominal: Soft, non tender, non distended, bowel sounds +, no guarding Extremities: no edema, no cyanosis, pulses palpable bilaterally DP and PT Neuro: Grossly nonfocal  Discharge Instructions  Discharge Instructions    Diet - low sodium heart healthy    Complete by:  As directed      Increase activity slowly    Complete by:  As directed             Medication List    TAKE these medications        amLODipine 10 MG tablet  Commonly known as:  NORVASC  TAKE 1 TABLET BY MOUTH EVERY DAY     B-Complex-C Tabs  Take 1 tablet by mouth daily.     dolutegravir 50 MG tablet  Commonly known as:  TIVICAY  Take 1 tablet (50 mg total) by mouth daily.     ferrous sulfate 325 (65 FE) MG EC tablet  Take 1 tablet (325 mg total) by mouth 2 (two) times daily before a meal.     lamivudine 300 MG tablet  Commonly known as:  EPIVIR  Take 1 tablet (300 mg total) by mouth daily.     lisinopril 10 MG tablet  Commonly known as:  PRINIVIL,ZESTRIL  TAKE 1 TABLET BY MOUTH EVERY DAY     megestrol 40 MG tablet  Commonly known as:  MEGACE  Take 2 tablets (80 mg total) by mouth daily. Can increase to 3 tablets daily if needed for heavy bleeding     rilpivirine 25 MG Tabs tablet  Commonly known as:  EDURANT  Take 1 tablet (25 mg total) by  mouth daily with breakfast.     spironolactone 100 MG tablet  Commonly known as:  ALDACTONE  Take 1 tablet (100 mg total) by mouth daily.           Follow-up Information    Follow up with Minerva Ends, MD.   Specialty:  Family Medicine   Contact information:   [place] [address] [phone]        The results of significant diagnostics from this hospitalization (including imaging, microbiology, ancillary and laboratory) are listed below for reference.     Microbiology: No results found for this or any previous visit (from the past 240 hour(s)).   Labs: Basic Metabolic Panel:  Recent Labs Lab 12/29/14 1026 12/30/14 0539  NA 136 136  K 3.9 4.0  CL 109 107  CO2 18* 24  GLUCOSE 110* 99  BUN 7 <5*  CREATININE 0.74 0.70  CALCIUM 9.1 8.5   Liver Function Tests: No results for input(s): AST, ALT, ALKPHOS, BILITOT, PROT, ALBUMIN in the last 168 hours. No results for input(s): LIPASE, AMYLASE in the last 168 hours. No results for input(s): AMMONIA in the last 168 hours. CBC:  Recent Labs Lab 12/28/14 1201 12/29/14 1026 12/30/14 0539  WBC 10.4 10.1 9.0  NEUTROABS  --  6.6  --   HGB 6.9* 7.1* 7.2*  HCT 22.2* 23.5* 23.6*  MCV 81.3 81.9 81.4  PLT 565* 516* 495*   Cardiac Enzymes:  Recent Labs Lab 12/29/14 1026  TROPONINI <0.03   BNP: BNP (last 3 results) No results for input(s): BNP in the last 8760 hours.  ProBNP (last 3 results) No results for input(s): PROBNP in the last 8760 hours.  CBG: No results for input(s): GLUCAP in the last 168 hours.   SIGNED: Time coordinating discharge: Over 30 minutes  Faye Ramsay, MD  [facility] 12/30/2014, 1:47 PM Pager [phone]  If 7PM-7AM, please contact night-coverage [email or web address] Password [email or web address]

## 2014-12-30 NOTE — Procedures (Signed)
IVCgram: negative IVC filter placed, infrarenal, retrievable No complication No blood loss. See complete dictation in [facility] PACS  

## 2015-01-01 ENCOUNTER — Encounter: Payer: Self-pay | Admitting: Family Medicine

## 2015-01-01 LAB — TYPE AND SCREEN
ABO/RH(D): O POS
Antibody Screen: NEGATIVE
UNIT DIVISION: 0
UNIT DIVISION: 0

## 2015-01-05 ENCOUNTER — Ambulatory Visit (HOSPITAL_COMMUNITY): Payer: No Typology Code available for payment source

## 2015-01-05 ENCOUNTER — Other Ambulatory Visit: Payer: Self-pay | Admitting: *Deleted

## 2015-01-05 MED ORDER — RIVAROXABAN 20 MG PO TABS: 20.0000 mg | ORAL_TABLET | Freq: Every day | ORAL | Status: DC

## 2015-01-05 MED ORDER — XARELTO VTE STARTER PACK 15 & 20 MG PO TBPK: 20.0000 mg | ORAL_TABLET | ORAL | Status: DC

## 2015-01-08 ENCOUNTER — Other Ambulatory Visit: Payer: Self-pay | Admitting: Infectious Disease

## 2015-01-08 DIAGNOSIS — B20 Human immunodeficiency virus [HIV] disease: Secondary | ICD-10-CM

## 2015-01-09 ENCOUNTER — Ambulatory Visit: Payer: Self-pay | Attending: Family Medicine | Admitting: Family Medicine

## 2015-01-09 ENCOUNTER — Encounter: Payer: Self-pay | Admitting: Family Medicine

## 2015-01-09 DIAGNOSIS — Z86718 Personal history of other venous thrombosis and embolism: Secondary | ICD-10-CM | POA: Insufficient documentation

## 2015-01-09 DIAGNOSIS — D649 Anemia, unspecified: Secondary | ICD-10-CM | POA: Insufficient documentation

## 2015-01-09 DIAGNOSIS — D62 Acute posthemorrhagic anemia: Secondary | ICD-10-CM

## 2015-01-09 LAB — CBC
HCT: 29.8 % — ABNORMAL LOW (ref 36.0–46.0)
Hemoglobin: 9.4 g/dL — ABNORMAL LOW (ref 12.0–15.0)
MCH: 26.1 pg (ref 26.0–34.0)
MCHC: 31.5 g/dL (ref 30.0–36.0)
MCV: 82.8 fL (ref 78.0–100.0)
MPV: 10.8 fL (ref 8.6–12.4)
Platelets: 420 10*3/uL — ABNORMAL HIGH (ref 150–400)
RBC: 3.6 MIL/uL — ABNORMAL LOW (ref 3.87–5.11)
RDW: 20.1 % — ABNORMAL HIGH (ref 11.5–15.5)
WBC: 8.7 10*3/uL (ref 4.0–10.5)

## 2015-01-09 LAB — BASIC METABOLIC PANEL
BUN: 7 mg/dL (ref 6–23)
CO2: 22 mEq/L (ref 19–32)
Calcium: 9.3 mg/dL (ref 8.4–10.5)
Chloride: 108 mEq/L (ref 96–112)
Creat: 0.85 mg/dL (ref 0.50–1.10)
GLUCOSE: 149 mg/dL — AB (ref 70–99)
POTASSIUM: 4.2 meq/L (ref 3.5–5.3)
Sodium: 137 mEq/L (ref 135–145)

## 2015-01-09 NOTE — Progress Notes (Signed)
Patient admitted Friday am and dc'd Sat. Afternoon for vaginal bleeding secondary to Xarelto.  Patient received 1 unit of blood and IVC filter placed.   Patient's Hgb 7.1 and patient has been taking her iron  Recommendations for Outpatient Follow-up:  1. Pt will need to follow up with PCP in 2-3 weeks post discharge 2. Please obtain BMP to evaluate electrolytes and kidney function 3. Please also check CBC to evaluate Hg and Hct levels 4. Please note Xarelto stopped and pt had IVC filter placed

## 2015-01-09 NOTE — Patient Instructions (Signed)
Wanda Kane,  Thank you for coming in today. Checking CBC and BMP today. You will be called with results  F/u in 2 months for anemia follow up, it may be sooner based on today's lab if so you will be notified.  Dr. Adrian Blackwater

## 2015-01-09 NOTE — Progress Notes (Signed)
   Subjective:    Patient ID: Wanda Kane, female    DOB: February 11, 1961, 54 y.o.   MRN: [id number] CC; HFU for anemia in setting of DVT s/p IVC filter placement  HPI 54 yo F HFU for anemia:  1. Anemia: HFU for anemia in the setting of menorrhagia and xarelto treatment for R DVT. She was hospitalized on 2/12-2/13/16.  She is no longer taking xarelto. Has IVC filter. Denies fatigue, CP, SOB. Is currently on menstrual period but bleeding is not too heavy. Took one megace since starting periods.   Soc Hx: non smoker   Review of Systems As per HPI    Objective:   Physical Exam BP 111/70 mmHg  Pulse 88  Temp(Src) 98.3 F (36.8 C)  Resp 16  Ht 5\' 5"  (1.651 m)  Wt 220 lb (99.791 kg)  BMI 36.61 kg/m2  SpO2 95%  LMP 12/18/2014 General appearance: alert, cooperative and no distress Lungs: clear to auscultation bilaterally Heart: regular rate and rhythm, S1, S2 normal, no murmur, click, rub or gallop Extremities: extremities normal, atraumatic, no cyanosis or edema     Assessment & Plan:

## 2015-01-09 NOTE — Assessment & Plan Note (Signed)
A:  Having menstrual bleeding, but it is not too heavy.  P:  Thank you for coming in today. Checking CBC and BMP today. You will be called with results  F/u in 2 months for anemia follow up, it may be sooner based on today's lab if so you will be notified.

## 2015-01-10 ENCOUNTER — Encounter: Payer: Self-pay | Admitting: Family Medicine

## 2015-01-10 ENCOUNTER — Other Ambulatory Visit: Payer: Self-pay | Admitting: Family Medicine

## 2015-01-10 DIAGNOSIS — D62 Acute posthemorrhagic anemia: Secondary | ICD-10-CM

## 2015-01-11 ENCOUNTER — Other Ambulatory Visit: Payer: No Typology Code available for payment source

## 2015-01-11 ENCOUNTER — Telehealth: Payer: Self-pay | Admitting: *Deleted

## 2015-01-11 NOTE — Telephone Encounter (Signed)
-----   Message from Minerva Ends, MD sent at 01/10/2015  9:15 AM EST ----- Hgb improved to 9.4 Normal BMP Continue current treatment. Repeat Hgb in 2 weeks

## 2015-01-11 NOTE — Telephone Encounter (Signed)
Left voice message to return call 

## 2015-01-12 ENCOUNTER — Other Ambulatory Visit: Payer: No Typology Code available for payment source

## 2015-01-18 ENCOUNTER — Ambulatory Visit: Payer: No Typology Code available for payment source | Admitting: Internal Medicine

## 2015-01-23 ENCOUNTER — Other Ambulatory Visit: Payer: Self-pay | Admitting: Licensed Clinical Social Worker

## 2015-01-23 ENCOUNTER — Encounter: Payer: Self-pay | Admitting: Infectious Disease

## 2015-01-23 ENCOUNTER — Ambulatory Visit: Payer: Self-pay

## 2015-01-23 DIAGNOSIS — B20 Human immunodeficiency virus [HIV] disease: Secondary | ICD-10-CM

## 2015-01-23 DIAGNOSIS — I10 Essential (primary) hypertension: Secondary | ICD-10-CM

## 2015-01-23 MED ORDER — SPIRONOLACTONE 100 MG PO TABS: 100.0000 mg | ORAL_TABLET | Freq: Every day | ORAL | Status: DC

## 2015-01-23 NOTE — Telephone Encounter (Signed)
Patient is using an Geologist, engineering and only wanted 5 days of spirolactone called in until she received it in the mail.

## 2015-01-24 ENCOUNTER — Ambulatory Visit: Payer: No Typology Code available for payment source | Admitting: Obstetrics & Gynecology

## 2015-02-01 ENCOUNTER — Ambulatory Visit: Payer: Self-pay

## 2015-02-01 DIAGNOSIS — F4323 Adjustment disorder with mixed anxiety and depressed mood: Secondary | ICD-10-CM

## 2015-02-01 NOTE — BH Specialist Note (Signed)
Wanda Kane reported that she recently went to the ER for a blood clot in her leg.  She said she was prescribed Xarelto, which led to excessive bleeding and requiring her to be admitted to the hospital for a brief stay.  Otherwise, she is taking 2 classes online at [employer] and is still considering either graduate school or nursing school.  Her mood was good today and she did not feel the need to reschedule at this time.   [name], LCSW  [place]: [phone]

## 2015-02-02 ENCOUNTER — Other Ambulatory Visit: Payer: Self-pay | Admitting: Infectious Disease

## 2015-02-28 ENCOUNTER — Ambulatory Visit: Payer: Self-pay | Admitting: Obstetrics & Gynecology

## 2015-03-03 ENCOUNTER — Other Ambulatory Visit: Payer: Self-pay | Admitting: Infectious Disease

## 2015-03-12 ENCOUNTER — Ambulatory Visit: Payer: Self-pay

## 2015-03-12 ENCOUNTER — Ambulatory Visit (HOSPITAL_COMMUNITY): Payer: Self-pay

## 2015-03-13 ENCOUNTER — Other Ambulatory Visit: Payer: Self-pay | Admitting: *Deleted

## 2015-03-13 DIAGNOSIS — B20 Human immunodeficiency virus [HIV] disease: Secondary | ICD-10-CM

## 2015-03-13 MED ORDER — DOLUTEGRAVIR SODIUM 50 MG PO TABS: 50.0000 mg | ORAL_TABLET | Freq: Every day | ORAL | Status: DC

## 2015-03-13 MED ORDER — LAMIVUDINE 300 MG PO TABS: 300.0000 mg | ORAL_TABLET | Freq: Every day | ORAL | Status: DC

## 2015-03-13 MED ORDER — RILPIVIRINE HCL 25 MG PO TABS: 25.0000 mg | ORAL_TABLET | Freq: Every day | ORAL | Status: DC

## 2015-03-13 NOTE — Telephone Encounter (Signed)
ADAP Applicaiton

## 2015-03-14 ENCOUNTER — Other Ambulatory Visit: Payer: Self-pay | Admitting: *Deleted

## 2015-03-14 DIAGNOSIS — B20 Human immunodeficiency virus [HIV] disease: Secondary | ICD-10-CM

## 2015-03-14 MED ORDER — RILPIVIRINE HCL 25 MG PO TABS: 25.0000 mg | ORAL_TABLET | Freq: Every day | ORAL | Status: DC

## 2015-03-14 MED ORDER — LAMIVUDINE 300 MG PO TABS: 300.0000 mg | ORAL_TABLET | Freq: Every day | ORAL | Status: DC

## 2015-03-14 MED ORDER — DOLUTEGRAVIR SODIUM 50 MG PO TABS: 50.0000 mg | ORAL_TABLET | Freq: Every day | ORAL | Status: DC

## 2015-03-14 NOTE — Telephone Encounter (Signed)
ADAP Application 

## 2015-03-15 ENCOUNTER — Encounter (HOSPITAL_COMMUNITY): Payer: Self-pay

## 2015-03-23 ENCOUNTER — Encounter: Payer: Self-pay | Admitting: Infectious Disease

## 2015-03-26 ENCOUNTER — Ambulatory Visit: Payer: Self-pay | Admitting: Family Medicine

## 2015-03-27 ENCOUNTER — Encounter: Payer: Self-pay | Admitting: Infectious Disease

## 2015-03-29 ENCOUNTER — Ambulatory Visit: Payer: Self-pay

## 2015-03-29 DIAGNOSIS — F4323 Adjustment disorder with mixed anxiety and depressed mood: Secondary | ICD-10-CM

## 2015-03-29 NOTE — BH Specialist Note (Signed)
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  She is awaiting approval pending her background check, etc.  She expressed some frustration that she will only be making $8.50/hour, but is glad to finally have a job.  She said that she feels like the "Universe" is telling her to stop trying to pursue a career in social work, since it has been 3 years since she last did that and has had no luck finding a social work job, despite multiple attempts.  Her mood is good.  Plan to meet again in 2 weeks. [name], LCSW  [place]: [phone]

## 2015-04-03 ENCOUNTER — Ambulatory Visit: Payer: Self-pay | Attending: Family Medicine | Admitting: Family Medicine

## 2015-04-03 ENCOUNTER — Encounter: Payer: Self-pay | Admitting: Family Medicine

## 2015-04-03 VITALS — BP 120/76 | HR 75 | Temp 98.6°F | Resp 16 | Ht 65.0 in | Wt 223.0 lb

## 2015-04-03 DIAGNOSIS — Z86718 Personal history of other venous thrombosis and embolism: Secondary | ICD-10-CM | POA: Insufficient documentation

## 2015-04-03 DIAGNOSIS — Z021 Encounter for pre-employment examination: Secondary | ICD-10-CM | POA: Insufficient documentation

## 2015-04-03 DIAGNOSIS — Z21 Asymptomatic human immunodeficiency virus [HIV] infection status: Secondary | ICD-10-CM | POA: Insufficient documentation

## 2015-04-03 DIAGNOSIS — Z0000 Encounter for general adult medical examination without abnormal findings: Secondary | ICD-10-CM

## 2015-04-03 DIAGNOSIS — Z975 Presence of (intrauterine) contraceptive device: Secondary | ICD-10-CM | POA: Insufficient documentation

## 2015-04-03 DIAGNOSIS — Z111 Encounter for screening for respiratory tuberculosis: Secondary | ICD-10-CM | POA: Insufficient documentation

## 2015-04-03 DIAGNOSIS — N939 Abnormal uterine and vaginal bleeding, unspecified: Secondary | ICD-10-CM | POA: Insufficient documentation

## 2015-04-03 DIAGNOSIS — I82401 Acute embolism and thrombosis of unspecified deep veins of right lower extremity: Secondary | ICD-10-CM

## 2015-04-03 NOTE — Progress Notes (Signed)
   Subjective:    Patient ID: Wanda Kane, female    DOB: Feb 16, 1961, 54 y.o.   MRN: [id number] CC: pre-employment physical with PPD placement  HPI  1. 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 She does not want to disclose her HIV positive status.   2. Hx of RLE DVT: has IVF. No leg pain or swelling. Was unable to tolerate anticoagulants due to acute blood loss anemia from vaginal bleeding.   3. Vaginal bleeding: persist. Stopped megace due to headache. Followed costly by gynecology. Considering endometrial ablation. Not willing to have a hysterectomy.   Soc Hx: non smoker  Review of Systems  Constitutional: Negative for fever and chills.  Respiratory: Negative for shortness of breath.   Cardiovascular: Negative for chest pain.  Genitourinary: Positive for menstrual problem.    GAD-7: score of 1. 1-3.     Objective:   Physical Exam BP 120/76 mmHg  Pulse 75  Temp(Src) 98.6 F (37 C) (Oral)  Resp 16  Ht 5\' 5"  (1.651 m)  Wt 223 lb (101.152 kg)  BMI 37.11 kg/m2  SpO2 96%  LMP 03/11/2015 General appearance: alert, cooperative and no distress Head: Normocephalic, without obvious abnormality, atraumatic Eyes: conjunctivae/corneas clear. PERRL, EOM's intact. Fundi benign. Ears: normal TM's and external ear canals both ears Nose: Nares normal. Septum midline. Mucosa normal. No drainage or sinus tenderness. Throat: lips, mucosa, and tongue normal; teeth and gums normal Neck: no adenopathy, supple, symmetrical, trachea midline and thyroid not enlarged, symmetric, no tenderness/mass/nodules Lungs: clear to auscultation bilaterally Heart: regular rate and rhythm, S1, S2 normal, no murmur, click, rub or gallop Extremities: extremities normal, atraumatic, no cyanosis or edema, Full ROM in all muscle groups  Skin: Skin color, texture, turgor normal. No rashes or lesions   PPD placed today      Assessment & Plan:

## 2015-04-03 NOTE — Assessment & Plan Note (Signed)
  You do not need f/u doppler given lack of symptoms and you are not on blood thinner. I will cancel order.

## 2015-04-03 NOTE — Assessment & Plan Note (Signed)
  For vaginal bleeding:  Ongoing. Patient stopped megace due to headache. Has uterine fibroids.  Consider IUD-copper Endometrial ablation

## 2015-04-03 NOTE — Patient Instructions (Signed)
Ms . Breau,  Thank you for coming in today  Physical exam and PPD placed today.   You do not need f/u doppler given lack of symptoms and you are not on blood thinner. I will cancel order.  For vaginal bleeding:  Consider IUD-copper Endometrial ablation   Healthcare maintenance: GI referral for screening colonoscopy   F/u in 2 days for PPD reading  F/u with me in 3 months sooner if needed   Dr. Adrian Blackwater

## 2015-04-03 NOTE — Progress Notes (Signed)
Work physical and TB test

## 2015-04-03 NOTE — Assessment & Plan Note (Signed)
PPD placed today

## 2015-04-05 ENCOUNTER — Telehealth: Payer: Self-pay | Admitting: Gastroenterology

## 2015-04-05 ENCOUNTER — Ambulatory Visit: Payer: Self-pay | Attending: Family Medicine | Admitting: Family Medicine

## 2015-04-05 DIAGNOSIS — Z111 Encounter for screening for respiratory tuberculosis: Secondary | ICD-10-CM

## 2015-04-05 DIAGNOSIS — Z7689 Persons encountering health services in other specified circumstances: Secondary | ICD-10-CM

## 2015-04-05 LAB — TB SKIN TEST
INDURATION: 0 mm
TB SKIN TEST: NEGATIVE

## 2015-04-05 NOTE — Progress Notes (Signed)
Patient ID: Wanda Kane, female   DOB: 19-May-1961, 54 y.o.   MRN: [id number] PPD reading PPD negative

## 2015-04-05 NOTE — Progress Notes (Signed)
Patient ID: Wanda Kane, female   DOB: Jul 19, 1961, 54 y.o.   MRN: [id number] Patient returned for PPD reading PPD negative

## 2015-04-05 NOTE — Telephone Encounter (Signed)
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

## 2015-04-09 ENCOUNTER — Encounter: Payer: Self-pay | Admitting: *Deleted

## 2015-04-12 ENCOUNTER — Ambulatory Visit: Payer: Self-pay

## 2015-04-12 DIAGNOSIS — F4323 Adjustment disorder with mixed anxiety and depressed mood: Secondary | ICD-10-CM

## 2015-04-12 NOTE — BH Specialist Note (Signed)
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  She turned down another CNA position because they asked for a doctor to sign that she did not have a "communicable disease".  I pointed out to her that she is "undetectable" and that means she cannot "communicate" the disease to someone.  She said she had not thought about that.  She talked about being nervous about returning to the workforce.  She also talked about her mixed feelings of wanting to go into nursing vs. Social work.  I pointed out that she can do counseling even if she pursues nursing and she thanked me for pointing that out.  Plan to meet again in 2 weeks. [name], LCSW  [place]: [phone]

## 2015-04-18 ENCOUNTER — Encounter (HOSPITAL_COMMUNITY): Payer: Self-pay

## 2015-04-25 ENCOUNTER — Encounter: Payer: Self-pay | Admitting: Infectious Disease

## 2015-04-30 ENCOUNTER — Ambulatory Visit: Payer: Self-pay

## 2015-05-01 ENCOUNTER — Telehealth: Payer: Self-pay | Admitting: Infectious Disease

## 2015-05-02 ENCOUNTER — Ambulatory Visit: Payer: Self-pay | Admitting: Obstetrics & Gynecology

## 2015-05-08 ENCOUNTER — Other Ambulatory Visit: Payer: Self-pay | Admitting: Licensed Clinical Social Worker

## 2015-05-08 MED ORDER — FERROUS SULFATE 325 (65 FE) MG PO TBEC: 325.0000 mg | DELAYED_RELEASE_TABLET | Freq: Two times a day (BID) | ORAL | Status: DC

## 2015-05-14 ENCOUNTER — Ambulatory Visit: Payer: Self-pay

## 2015-05-21 ENCOUNTER — Other Ambulatory Visit: Payer: Self-pay | Admitting: Infectious Disease

## 2015-05-21 DIAGNOSIS — I10 Essential (primary) hypertension: Secondary | ICD-10-CM

## 2015-05-22 MED ORDER — SPIRONOLACTONE 100 MG PO TABS: 100.0000 mg | ORAL_TABLET | Freq: Every day | ORAL | Status: DC

## 2015-05-22 NOTE — Addendum Note (Signed)
Addended by: Landis Gandy on: 05/22/2015 10:50 AM   Modules accepted: Orders

## 2015-05-23 ENCOUNTER — Other Ambulatory Visit: Payer: Self-pay | Admitting: Infectious Disease

## 2015-05-23 DIAGNOSIS — B20 Human immunodeficiency virus [HIV] disease: Secondary | ICD-10-CM

## 2015-05-28 ENCOUNTER — Ambulatory Visit: Payer: Self-pay | Admitting: Obstetrics & Gynecology

## 2015-06-02 ENCOUNTER — Other Ambulatory Visit: Payer: Self-pay | Admitting: Internal Medicine

## 2015-06-15 ENCOUNTER — Inpatient Hospital Stay (HOSPITAL_COMMUNITY)
Admission: AD | Admit: 2015-06-15 | Discharge: 2015-06-15 | Disposition: A | Discharge status: Home / Self Care | Payer: Self-pay | Source: Ambulatory Visit | Attending: Family Medicine | Admitting: Family Medicine

## 2015-06-15 ENCOUNTER — Encounter (HOSPITAL_COMMUNITY): Payer: Self-pay | Admitting: *Deleted

## 2015-06-15 DIAGNOSIS — M79606 Pain in leg, unspecified: Secondary | ICD-10-CM | POA: Insufficient documentation

## 2015-06-15 DIAGNOSIS — I10 Essential (primary) hypertension: Secondary | ICD-10-CM | POA: Insufficient documentation

## 2015-06-15 DIAGNOSIS — Z21 Asymptomatic human immunodeficiency virus [HIV] infection status: Secondary | ICD-10-CM | POA: Insufficient documentation

## 2015-06-15 LAB — COMPREHENSIVE METABOLIC PANEL
ALK PHOS: 95 U/L (ref 38–126)
ALT: 26 U/L (ref 14–54)
AST: 49 U/L — AB (ref 15–41)
Albumin: 4.4 g/dL (ref 3.5–5.0)
Anion gap: 7 (ref 5–15)
BUN: 14 mg/dL (ref 6–20)
CO2: 25 mmol/L (ref 22–32)
CREATININE: 0.91 mg/dL (ref 0.44–1.00)
Calcium: 9.5 mg/dL (ref 8.9–10.3)
Chloride: 105 mmol/L (ref 101–111)
GFR calc Af Amer: >60 mL/min (ref >60)
GFR calc non Af Amer: >60 mL/min (ref >60)
Glucose, Bld: 129 mg/dL — ABNORMAL HIGH (ref 65–99)
Potassium: 4.1 mmol/L (ref 3.5–5.1)
SODIUM: 137 mmol/L (ref 135–145)
TOTAL PROTEIN: 8.2 g/dL — AB (ref 6.5–8.1)
Total Bilirubin: 0.6 mg/dL (ref 0.3–1.2)

## 2015-06-15 LAB — URINALYSIS, ROUTINE W REFLEX MICROSCOPIC
BILIRUBIN URINE: NEGATIVE
GLUCOSE, UA: NEGATIVE mg/dL
Hgb urine dipstick: NEGATIVE
Ketones, ur: NEGATIVE mg/dL
Leukocytes, UA: NEGATIVE
Nitrite: NEGATIVE
Protein, ur: NEGATIVE mg/dL
Specific Gravity, Urine: 1.025 (ref 1.005–1.030)
Urobilinogen, UA: 0.2 mg/dL (ref 0.0–1.0)
pH: 6 (ref 5.0–8.0)

## 2015-06-15 NOTE — Discharge Instructions (Signed)
Dehydration, Adult Dehydration is when you lose more fluids from the body than you take in. Vital organs like the kidneys, brain, and heart cannot function without a proper amount of fluids and salt. Any loss of fluids from the body can cause dehydration.  CAUSES   Vomiting.  Diarrhea.  Excessive sweating.  Excessive urine output.  Fever. SYMPTOMS  Mild dehydration  Thirst.  Dry lips.  Slightly dry mouth. Moderate dehydration  Very dry mouth.  Sunken eyes.  Skin does not bounce back quickly when lightly pinched and released.  Dark urine and decreased urine production.  Decreased tear production.  Headache. Severe dehydration  Very dry mouth.  Extreme thirst.  Rapid, weak pulse (more than 100 beats per minute at rest).  Cold hands and feet.  Not able to sweat in spite of heat and temperature.  Rapid breathing.  Blue lips.  Confusion and lethargy.  Difficulty being awakened.  Minimal urine production.  No tears. DIAGNOSIS  Your caregiver will diagnose dehydration based on your symptoms and your exam. Blood and urine tests will help confirm the diagnosis. The diagnostic evaluation should also identify the cause of dehydration. TREATMENT  Treatment of mild or moderate dehydration can often be done at home by increasing the amount of fluids that you drink. It is best to drink small amounts of fluid more often. Drinking too much at one time can make vomiting worse. Refer to the home care instructions below. Severe dehydration needs to be treated at the hospital where you will probably be given intravenous (IV) fluids that contain water and electrolytes. HOME CARE INSTRUCTIONS   Ask your caregiver about specific rehydration instructions.  Drink enough fluids to keep your urine clear or pale yellow.  Drink small amounts frequently if you have nausea and vomiting.  Eat as you normally do.  Avoid:  Foods or drinks high in sugar.  Carbonated  drinks.  Juice.  Extremely hot or cold fluids.  Drinks with caffeine.  Fatty, greasy foods.  Alcohol.  Tobacco.  Overeating.  Gelatin desserts.  Wash your hands well to avoid spreading bacteria and viruses.  Only take over-the-counter or prescription medicines for pain, discomfort, or fever as directed by your caregiver.  Ask your caregiver if you should continue all prescribed and over-the-counter medicines.  Keep all follow-up appointments with your caregiver. SEEK MEDICAL CARE IF:  You have abdominal pain and it increases or stays in one area (localizes).  You have a rash, stiff neck, or severe headache.  You are irritable, sleepy, or difficult to awaken.  You are weak, dizzy, or extremely thirsty. SEEK IMMEDIATE MEDICAL CARE IF:   You are unable to keep fluids down or you get worse despite treatment.  You have frequent episodes of vomiting or diarrhea.  You have blood or green matter (bile) in your vomit.  You have blood in your stool or your stool looks black and tarry.  You have not urinated in 6 to 8 hours, or you have only urinated a small amount of very dark urine.  You have a fever.  You faint. MAKE SURE YOU:   Understand these instructions.  Will watch your condition.  Will get help right away if you are not doing well or get worse. Document Released: 11/03/2005 Document Revised: 01/26/2012 Document Reviewed: 06/23/2011 ExitCare Patient Information 2015 [employer]. This information is not intended to replace advice given to you by your health care provider. Make sure you discuss any questions you have with your health care   provider.  

## 2015-06-15 NOTE — MAU Note (Signed)
Pt states she awakened with severe lower leg cramps. Pt states she became worried that her potassium was low so she came in

## 2015-06-15 NOTE — MAU Provider Note (Signed)
History     CSN: [id number]  Arrival date and time: 06/15/15 0444   First Provider Initiated Contact with Patient 06/15/15 [phone]      No chief complaint on file.  HPI Comments: Wanda Kane is a 54 y.o. G1P1001 who presents today with bilateral lower leg pain. She states that the pain woke her from her sleep about 0300. She got up and moved around and the pain subsided. She is worried that she is possibly dehydrated or has low potassium.   Leg Pain  The incident occurred 3 to 6 hours ago. The incident occurred at home. There was no injury mechanism. The pain is present in the left leg and right leg. The quality of the pain is described as cramping (spasms). The pain is at a severity of 8/10. The pain is severe. The pain has been fluctuating since onset. She reports no foreign bodies present. Nothing aggravates the symptoms.     Past Medical History  Diagnosis Date  . HIV infection Dx 1994  . Fibroids 2010  . Fibroid   . DVT (deep venous thrombosis) Jan 2016  . Hypertension 1994  . Deep vein thrombophlebitis of right leg     2016  . DVT (deep venous thrombosis)   . Vaginal bleeding 12/2014    due to xarelto    Past Surgical History  Procedure Laterality Date  . Cesarean section  1983  . Tonsillectomy and adenoidectomy    . Cesarean section      Family History  Problem Relation Age of Onset  . Hypertension Mother   . CAD Brother   . Heart disease Brother 61    cardiac arrest    History  Substance Use Topics  . Smoking status: Never Smoker   . Smokeless tobacco: Never Used  . Alcohol Use: No    Allergies:  Allergies  Allergen Reactions  . Viread [Tenofovir Disoproxil Fumarate] Other (See Comments)    Rhabdomyolysis    Prescriptions prior to admission  Medication Sig Dispense Refill Last Dose  . amLODipine (NORVASC) 10 MG tablet TAKE 1 TABLET BY MOUTH DAILY 30 tablet 0 06/15/2015 at Unknown time  . B-Complex-C TABS Take 1 tablet by mouth daily.   06/14/2015 at  Unknown time  . dolutegravir (TIVICAY) 50 MG tablet Take 1 tablet (50 mg total) by mouth daily. 30 tablet 5 06/15/2015 at Unknown time  . ferrous sulfate 325 (65 FE) MG EC tablet Take 1 tablet (325 mg total) by mouth 2 (two) times daily before a meal. 60 tablet 11 06/14/2015 at Unknown time  . lamivudine (EPIVIR) 300 MG tablet TAKE 1 TABLET BY MOUTH DAILY 30 tablet 5 06/15/2015 at Unknown time  . lisinopril (PRINIVIL,ZESTRIL) 10 MG tablet TAKE 1 TABLET BY MOUTH EVERY DAY 30 tablet 0 06/15/2015 at Unknown time  . rilpivirine (EDURANT) 25 MG TABS tablet Take 1 tablet (25 mg total) by mouth daily with breakfast. 30 tablet 5 06/15/2015 at Unknown time  . spironolactone (ALDACTONE) 100 MG tablet Take 1 tablet (100 mg total) by mouth daily. 30 tablet 5 06/15/2015 at Unknown time  . megestrol (MEGACE) 40 MG tablet Take 2 tablets (80 mg total) by mouth daily. Can increase to 3 tablets daily if needed for heavy bleeding (Patient not taking: Reported on 04/03/2015) 60 tablet 5 Not Taking    ROS Physical Exam   Blood pressure 136/85, pulse 83, temperature 98.6 F (37 C), temperature source Oral, resp. rate 18, height 5\' 4"  (1.626 m), weight 102.059  kg (225 lb), SpO2 98 %.  Physical Exam  Nursing note and vitals reviewed. Constitutional: She is oriented to person, place, and time. She appears well-developed and well-nourished. No distress.  HENT:  Head: Normocephalic.  Cardiovascular: Normal rate.   Respiratory: Effort normal.  GI: Soft. There is no tenderness.  Musculoskeletal: She exhibits no edema or tenderness.  L calf: 41 cm R calf: 41 cm  Neurological: She is alert and oriented to person, place, and time.  Skin: Skin is warm and dry.  Psychiatric: She has a normal mood and affect.    MAU Course  Procedures  MDM   Assessment and Plan   1. Pain of lower extremity, unspecified laterality    DC home Comfort measures reviewed  RX: none    Follow-up Information    Follow up with [facility].   Contact information:   [place] [id number]         Mathis Bud 06/15/2015, 6:56 AM

## 2015-06-19 ENCOUNTER — Ambulatory Visit: Payer: Self-pay

## 2015-06-21 ENCOUNTER — Telehealth: Payer: Self-pay | Admitting: *Deleted

## 2015-06-21 NOTE — Telephone Encounter (Signed)
Needs appt to renew RW/ADAP to continue medications.

## 2015-06-25 ENCOUNTER — Ambulatory Visit: Payer: Self-pay

## 2015-07-19 ENCOUNTER — Ambulatory Visit: Payer: Self-pay

## 2015-07-19 DIAGNOSIS — F4323 Adjustment disorder with mixed anxiety and depressed mood: Secondary | ICD-10-CM

## 2015-07-19 NOTE — BH Specialist Note (Signed)
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  She said she uses some of her social work skills talking to customers and she likes that.  She is also continuing to pursue nursing, taking a course at a time in preparation for applying to nursing school.  She also needed some feedback on a workplace infatuation, which she says she is enjoying.  This man works with a different team, but occasionally comes by her desk and speaks and they flirt with each other.  She said she is being very careful and actually trying to make it look like she is not interested in him.  Plan to meet again in one month. [name], LCSW

## 2015-07-28 ENCOUNTER — Encounter: Payer: Self-pay | Admitting: Emergency Medicine

## 2015-07-28 ENCOUNTER — Emergency Department: Payer: Self-pay

## 2015-07-28 ENCOUNTER — Emergency Department
Admission: EM | Admit: 2015-07-28 | Discharge: 2015-07-28 | Disposition: A | Discharge status: Home / Self Care | Payer: Self-pay | Attending: Emergency Medicine | Admitting: Emergency Medicine

## 2015-07-28 DIAGNOSIS — M795 Residual foreign body in soft tissue: Secondary | ICD-10-CM

## 2015-07-28 DIAGNOSIS — Z79899 Other long term (current) drug therapy: Secondary | ICD-10-CM | POA: Insufficient documentation

## 2015-07-28 DIAGNOSIS — R1310 Dysphagia, unspecified: Secondary | ICD-10-CM | POA: Insufficient documentation

## 2015-07-28 DIAGNOSIS — I10 Essential (primary) hypertension: Secondary | ICD-10-CM | POA: Insufficient documentation

## 2015-07-28 MED ORDER — LIDOCAINE VISCOUS 2 % MT SOLN: 20.0000 mL | OROMUCOSAL | Status: DC | PRN

## 2015-07-28 MED ORDER — GI COCKTAIL ~~LOC~~
30.0000 mL | ORAL | Status: AC
  Administered 2015-07-28: 30 mL via ORAL
  Filled 2015-07-28: qty 30

## 2015-07-28 NOTE — ED Notes (Addendum)
Pt reports waking up this morning with a sensation that something in stuck in her throat or that something is swollen. Pt states she had shrimp last night but did not notice anything last night. No distress noted. Pt states it feels like it is traveling down. Denies sore throat. States ate breakfast this am that made the sensation worse.

## 2015-07-28 NOTE — ED Provider Notes (Addendum)
[facility] Emergency Department Provider Note  ____________________________________________  Time seen: 6:35 PM  I have reviewed the triage vital signs and the nursing notes.   HISTORY  Chief Complaint Foreign Body    HPI Wanda Kane is a 54 y.o. female who reports having a discomfort in her throat this morning when she woke up. She notes that last night for dinner she was eating temperature of with the tails intact in that she ate the entire shrimp including the tails and chewed up the fibrous details and swallowed them. She had no symptoms for the rest of the night. Has no history of allergy to shellfish or shrimp. She went to bed and slept uneventfully all night. When she woke up this morning, she noticed the discomfort. Throughout the day it has been feeling like it is improving. She does have a history of acid reflux.  She also has HIV, which is what been well-controlled for 20 years. She is compliant with her medications. Previous HIV RNA was negative in November 2015.CD4 counts are good according the patient. She follows up with infectious disease regularly.     Past Medical History  Diagnosis Date  . HIV infection Dx 1994  . Fibroids 2010  . Fibroid   . DVT (deep venous thrombosis) Jan 2016  . Hypertension 1994  . Deep vein thrombophlebitis of right leg     2016  . DVT (deep venous thrombosis)   . Vaginal bleeding 12/2014    due to xarelto     Patient Active Problem List   Diagnosis Date Noted  . Healthcare maintenance 04/03/2015  . Screening-pulmonary TB 04/03/2015  . Vaginal bleeding 12/29/2014  . Hypertension 12/29/2014  . Deep vein thrombosis (DVT) 12/18/2014  . Perimenopause 10/26/2014  . Metabolic syndrome 32/99/2426  . Cutaneous skin tags 10/26/2014  . Depression 10/26/2014  . Back pain 05/16/2014  . Anemia 10/12/2013  . Myalgia and myositis 02/09/2013  . Condyloma acuminatum 10/27/2012  . Acquired acanthosis nigricans  10/27/2012  . Uterine fibroid 10/27/2012  . 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 HIV (human immunodeficiency virus infection) 10/18/2012  . HTN (hypertension) 10/18/2012  . Dyslipidemia 10/18/2012  . Pre-diabetes 10/18/2012     Past Surgical History  Procedure Laterality Date  . Cesarean section  1983  . Tonsillectomy and adenoidectomy    . Cesarean section       Current Outpatient Rx  Name  Route  Sig  Dispense  Refill  . amLODipine (NORVASC) 10 MG tablet      TAKE 1 TABLET BY MOUTH DAILY   30 tablet   0   . B-Complex-C TABS   Oral   Take 1 tablet by mouth daily.         . dolutegravir (TIVICAY) 50 MG tablet   Oral   Take 1 tablet (50 mg total) by mouth daily.   30 tablet   5   . ferrous sulfate 325 (65 FE) MG EC tablet   Oral   Take 1 tablet (325 mg total) by mouth 2 (two) times daily before a meal.   60 tablet   11   . lamivudine (EPIVIR) 300 MG tablet      TAKE 1 TABLET BY MOUTH DAILY   30 tablet   5   . lidocaine (XYLOCAINE) 2 % solution   Mouth/Throat   Use as directed 20 mLs in the mouth or throat every 2 (two) hours as needed for mouth pain. Gargle and spit out  100 mL   0   . lisinopril (PRINIVIL,ZESTRIL) 10 MG tablet      TAKE 1 TABLET BY MOUTH EVERY DAY   30 tablet   0   . megestrol (MEGACE) 40 MG tablet   Oral   Take 2 tablets (80 mg total) by mouth daily. Can increase to 3 tablets daily if needed for heavy bleeding Patient not taking: Reported on 04/03/2015   60 tablet   5   . rilpivirine (EDURANT) 25 MG TABS tablet   Oral   Take 1 tablet (25 mg total) by mouth daily with breakfast.   30 tablet   5   . spironolactone (ALDACTONE) 100 MG tablet   Oral   Take 1 tablet (100 mg total) by mouth daily.   30 tablet   5      Allergies Viread   Family History  Problem Relation Age of Onset  . Hypertension Mother   . CAD Brother   . Heart disease Brother 109    cardiac arrest    Social History Social  History  Substance Use Topics  . Smoking status: Never Smoker   . Smokeless tobacco: Never Used  . Alcohol Use: No    Review of Systems  Constitutional:   No fever or chills. No weight changes Eyes:   No blurry vision or double vision.  ENT:   Positive throat discomfort.  Cardiovascular:   No chest pain. Respiratory:   No dyspnea or cough. Gastrointestinal:   Negative for abdominal pain, vomiting and diarrhea.  No BRBPR or melena. Genitourinary:   Negative for dysuria, urinary retention, bloody urine, or difficulty urinating. Musculoskeletal:   Negative for back pain. No joint swelling or pain. Skin:   Negative for rash. Neurological:   Negative for headaches, focal weakness or numbness. Psychiatric:  No anxiety or depression.   Endocrine:  No hot/cold intolerance, changes in energy, or sleep difficulty.  10-point ROS otherwise negative.  ____________________________________________   PHYSICAL EXAM:  VITAL SIGNS: ED Triage Vitals  Enc Vitals Group     BP 07/28/15 1800 146/77 mmHg     Pulse Rate 07/28/15 1800 87     Resp 07/28/15 1800 18     Temp 07/28/15 1800 98.2 F (36.8 C)     Temp Source 07/28/15 1800 Oral     SpO2 07/28/15 1800 96 %     Weight 07/28/15 1800 225 lb (102.059 kg)     Height 07/28/15 1800 5\' 4"  (1.626 m)     Head Cir --      Peak Flow --      Pain Score 07/28/15 1805 3     Pain Loc --      Pain Edu? --      Excl. in Experiment? --      Constitutional:   Alert and oriented. Well appearing and in no distress. Eyes:   No scleral icterus. No conjunctival pallor. PERRL. EOMI ENT   Head:   Normocephalic and atraumatic.   Nose:   No congestion/rhinnorhea. No septal hematoma   Mouth/Throat:   MMM, no pharyngeal erythema. No peritonsillar mass. No uvula shift.   Neck:   No stridor. No SubQ emphysema. No meningismus. Nontender to even deep palpation Hematological/Lymphatic/Immunilogical:   No cervical lymphadenopathy. Cardiovascular:   RRR.  Normal and symmetric distal pulses are present in all extremities. No murmurs, rubs, or gallops. Respiratory:   Normal respiratory effort without tachypnea nor retractions. Breath sounds are clear and equal bilaterally. No wheezes/rales/rhonchi.  Gastrointestinal:   Soft and nontender. No distention. There is no CVA tenderness.  No rebound, rigidity, or guarding. Genitourinary:   deferred Musculoskeletal:   Nontender with normal range of motion in all extremities. No joint effusions.  No lower extremity tenderness.  No edema. Neurologic:   Normal speech and language.  CN 2-10 normal. Motor grossly intact. No pronator drift.  Normal gait. No gross focal neurologic deficits are appreciated.  Skin:    Skin is warm, dry and intact. No rash noted.  No petechiae, purpura, or bullae. Psychiatric:   Mood and affect are normal. Speech and behavior are normal. Patient exhibits appropriate insight and judgment.  ____________________________________________    LABS (pertinent positives/negatives) (all labs ordered are listed, but only abnormal results are displayed) Labs Reviewed - No data to display ____________________________________________   EKG    ____________________________________________    RADIOLOGY    ____________________________________________   PROCEDURES   ____________________________________________   INITIAL IMPRESSION / ASSESSMENT AND PLAN / ED COURSE  Pertinent labs & imaging results that were available during my care of the patient were reviewed by me and considered in my medical decision making (see chart for details).  Patient presents with discomfort feeling in the throat. By history this sounds like the shrimp details of the patient was eating last night have irritated or possibly abraded the mucosal surface of the upper esophagus. Coupled with the patient's acid reflux that likely worsened throughout the night and cause increased irritation of the area,  she developed symptoms as of this morning. When I explained this. To the patient she agrees that that makes complete sense to her and is concordant with the way she is experiencing the symptoms. She is not in any distress and smiling and in good spirits and very comfortable. She is tolerating oral intake. X-rays are negative. After GI cocktail she does feel even better. Upper scribe her viscous lidocaine and have her avoid acidic or abrasive foods over the next few days and I expect her symptoms will resolve. The patient agrees with this.  He denies she has HIV, this is well controlled and there is no reason to think that she has immunosuppression or infectious esophagitis.   ____________________________________________   FINAL CLINICAL IMPRESSION(S) / ED DIAGNOSES  Final diagnoses:  Odynophagia      Carrie Mew, MD 07/28/15 2010  Carrie Mew, MD 07/28/15 2010

## 2015-07-28 NOTE — Discharge Instructions (Signed)

## 2015-08-14 ENCOUNTER — Other Ambulatory Visit: Payer: Self-pay | Admitting: Infectious Disease

## 2015-08-16 ENCOUNTER — Ambulatory Visit: Payer: Self-pay

## 2015-08-16 DIAGNOSIS — F4323 Adjustment disorder with mixed anxiety and depressed mood: Secondary | ICD-10-CM

## 2015-08-16 NOTE — BH Specialist Note (Signed)
Ryenne was in good spirits today and talked again about her coworker, with whom she has a "crush".  They continue to flirt but nothing else has happened.  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  She talks about wanting to be in a relationship but is being patient.  Plan to meet again in 5 weeks. [name], LCSW

## 2015-09-10 ENCOUNTER — Other Ambulatory Visit: Payer: Self-pay | Admitting: Infectious Disease

## 2015-09-20 ENCOUNTER — Ambulatory Visit: Payer: Self-pay

## 2015-10-04 ENCOUNTER — Other Ambulatory Visit: Payer: Self-pay | Admitting: Family Medicine

## 2015-10-04 ENCOUNTER — Other Ambulatory Visit: Payer: Self-pay

## 2015-10-04 DIAGNOSIS — I10 Essential (primary) hypertension: Secondary | ICD-10-CM

## 2015-10-04 MED ORDER — LISINOPRIL 10 MG PO TABS: 10.0000 mg | ORAL_TABLET | Freq: Every day | ORAL | Status: DC

## 2015-10-04 NOTE — Telephone Encounter (Signed)
Nurse called patient, reached voicemail. Left message for patient to call Eldor Conaway with [facility], at [phone]. Nurse called to make patient aware of 1 month refill for lisinopril sent to pharmacy. Patient needs appointment for HTN prior to additional refills.

## 2015-10-24 ENCOUNTER — Ambulatory Visit: Payer: Self-pay | Admitting: Obstetrics & Gynecology

## 2015-10-31 ENCOUNTER — Other Ambulatory Visit: Payer: Self-pay | Admitting: Family Medicine

## 2015-11-03 ENCOUNTER — Other Ambulatory Visit: Payer: Self-pay | Admitting: Infectious Disease

## 2015-11-08 ENCOUNTER — Other Ambulatory Visit: Payer: Self-pay

## 2015-11-08 ENCOUNTER — Other Ambulatory Visit: Payer: Self-pay | Admitting: Infectious Disease

## 2015-11-08 DIAGNOSIS — B20 Human immunodeficiency virus [HIV] disease: Secondary | ICD-10-CM

## 2015-11-08 LAB — CBC WITH DIFFERENTIAL/PLATELET
BASOS ABS: 0.1 10*3/uL (ref 0.0–0.1)
Basophils Relative: 1 % (ref 0–1)
EOS PCT: 2 % (ref 0–5)
Eosinophils Absolute: 0.1 10*3/uL (ref 0.0–0.7)
HEMATOCRIT: 38.6 % (ref 36.0–46.0)
Hemoglobin: 13.4 g/dL (ref 12.0–15.0)
LYMPHS ABS: 3.2 10*3/uL (ref 0.7–4.0)
Lymphocytes Relative: 44 % (ref 12–46)
MCH: 31.1 pg (ref 26.0–34.0)
MCHC: 34.7 g/dL (ref 30.0–36.0)
MCV: 89.6 fL (ref 78.0–100.0)
MPV: 12.1 fL (ref 8.6–12.4)
Monocytes Absolute: 0.5 10*3/uL (ref 0.1–1.0)
Monocytes Relative: 7 % (ref 3–12)
Neutro Abs: 3.3 10*3/uL (ref 1.7–7.7)
Neutrophils Relative %: 46 % (ref 43–77)
PLATELETS: 287 10*3/uL (ref 150–400)
RBC: 4.31 MIL/uL (ref 3.87–5.11)
RDW: 13.2 % (ref 11.5–15.5)
WBC: 7.2 10*3/uL (ref 4.0–10.5)

## 2015-11-08 LAB — LIPID PANEL
CHOL/HDL RATIO: 3.6 ratio (ref <=5.0)
CHOLESTEROL: 223 mg/dL — AB (ref 125–200)
HDL: 62 mg/dL (ref >=46)
LDL Cholesterol: 115 mg/dL (ref <130)
TRIGLYCERIDES: 230 mg/dL — AB (ref <150)
VLDL: 46 mg/dL — ABNORMAL HIGH (ref <30)

## 2015-11-08 LAB — COMPLETE METABOLIC PANEL WITH GFR
ALT: 28 U/L (ref 6–29)
AST: 31 U/L (ref 10–35)
Albumin: 4.4 g/dL (ref 3.6–5.1)
Alkaline Phosphatase: 92 U/L (ref 33–130)
BUN: 12 mg/dL (ref 7–25)
CALCIUM: 9.7 mg/dL (ref 8.6–10.4)
CO2: 24 mmol/L (ref 20–31)
CREATININE: 1.01 mg/dL (ref 0.50–1.05)
Chloride: 103 mmol/L (ref 98–110)
GFR, EST AFRICAN AMERICAN: 73 mL/min (ref >=60)
GFR, Est Non African American: 63 mL/min (ref >=60)
Glucose, Bld: 98 mg/dL (ref 65–99)
Potassium: 4.2 mmol/L (ref 3.5–5.3)
Sodium: 138 mmol/L (ref 135–146)
Total Bilirubin: 0.6 mg/dL (ref 0.2–1.2)
Total Protein: 7.6 g/dL (ref 6.1–8.1)

## 2015-11-09 LAB — T-HELPER CELL (CD4) - (RCID CLINIC ONLY)
CD4 % Helper T Cell: 20 % — ABNORMAL LOW (ref 33–55)
CD4 T Cell Abs: 600 /uL (ref 400–2700)

## 2015-11-09 LAB — HIV-1 RNA QUANT-NO REFLEX-BLD
HIV 1 RNA Quant: <20 copies/mL (ref <20)
HIV-1 RNA Quant, Log: <1.3 Log copies/mL (ref <1.30)

## 2015-11-09 LAB — RPR

## 2015-11-09 IMAGING — XA IR IVC FILTER PLMT / S&I /IMG GUID/MOD SED
1 series · 10 of 10 positions shown · non-contrast
Comparison: none

CLINICAL DATA: Lower extremity DVT. Increased abnormal uterine
bleeding requiring transfusion, a relative contraindication to
anticoagulation. Caval filtration requested.

EXAM:
INFERIOR VENACAVOGRAM
IVC FILTER PLACEMENT UNDER FLUOROSCOPY
FLUOROSCOPY TIME:  42 seconds
TECHNIQUE: The procedure, risks (including but not limited to bleeding,
infection, organ damage ), benefits, and alternatives were explained
to the patient. Questions regarding the procedure were encouraged
and answered. The patient understands and consents to the procedure.
Patency of the right IJ vein was confirmed with ultrasound with
image documentation. An appropriate skin site was determined. Skin
site was marked, prepped with chlorhexidine, and draped using
maximum barrier technique. The region was infiltrated locally with
1% lidocaine.

[Series 1: run · 10 of 10 slices shown]
[im 1/10]
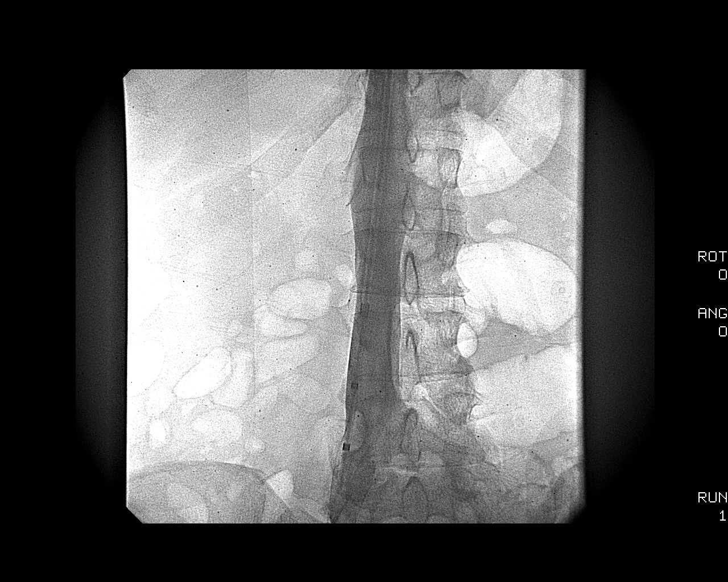
[im 2/10]
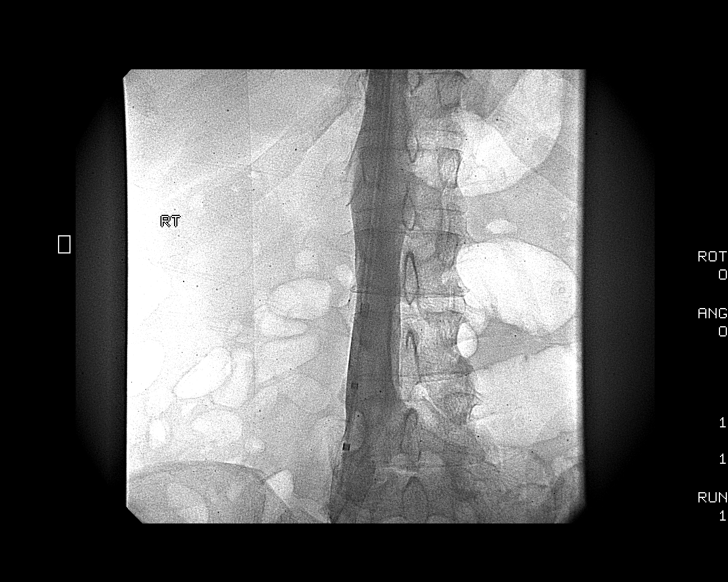
[im 3/10]
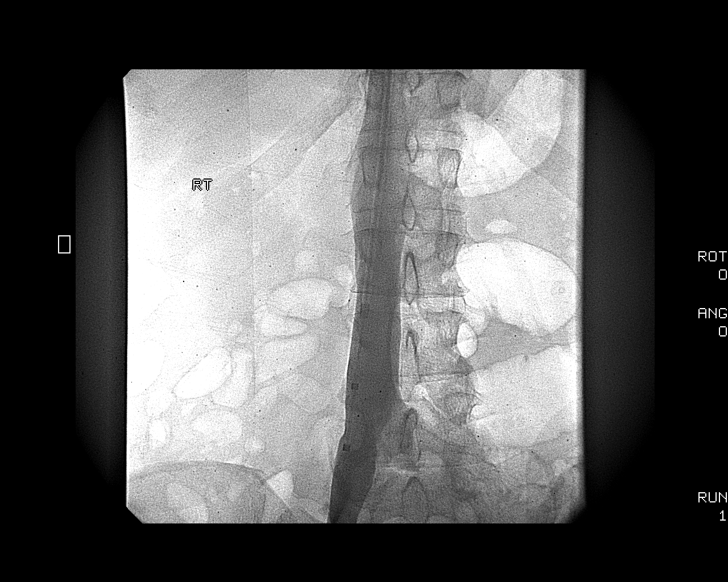
[im 4/10]
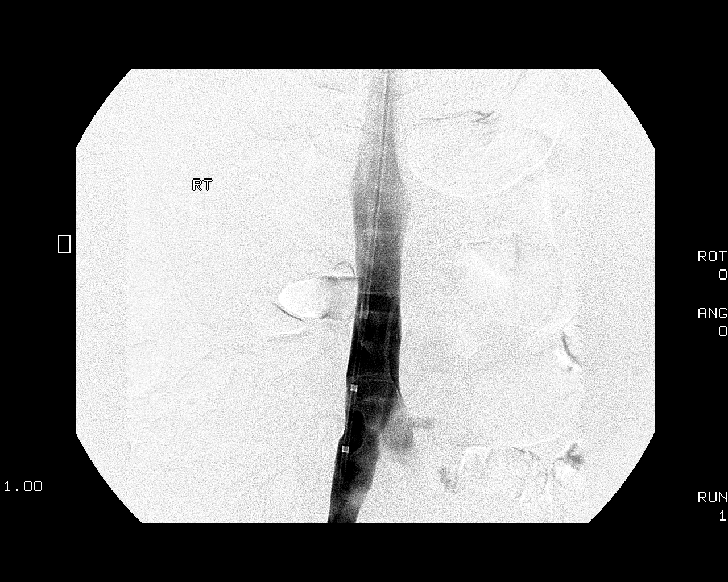
[im 5/10]
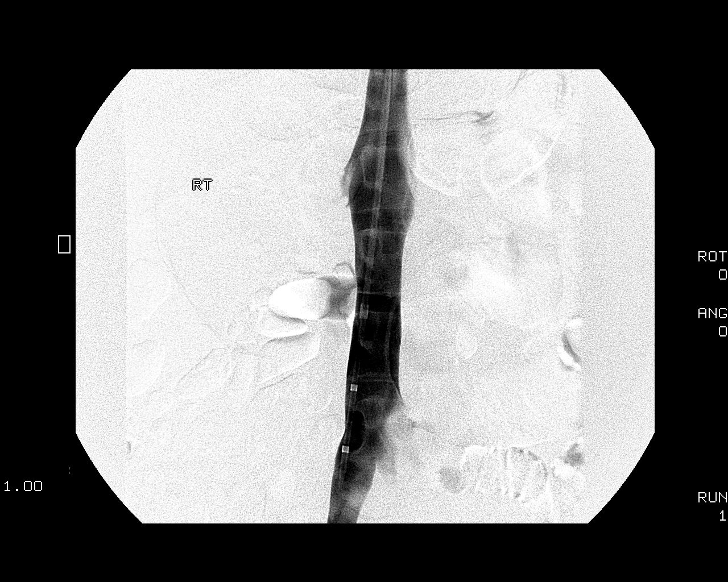
[im 6/10]
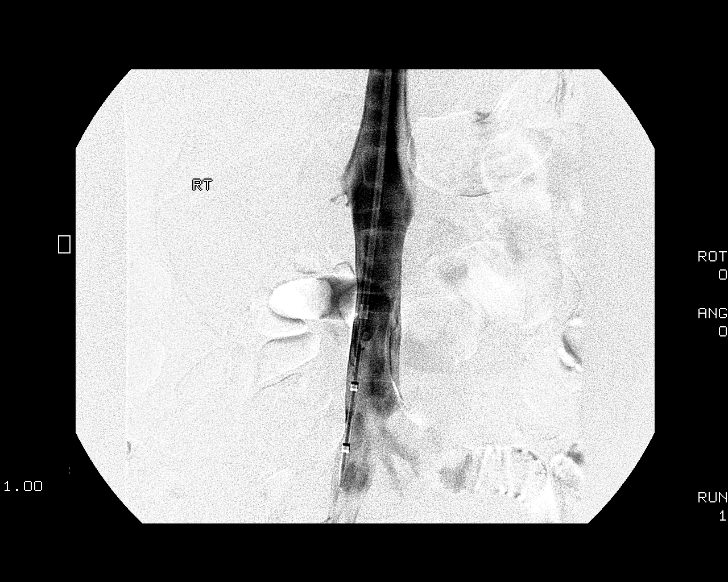
[im 7/10]
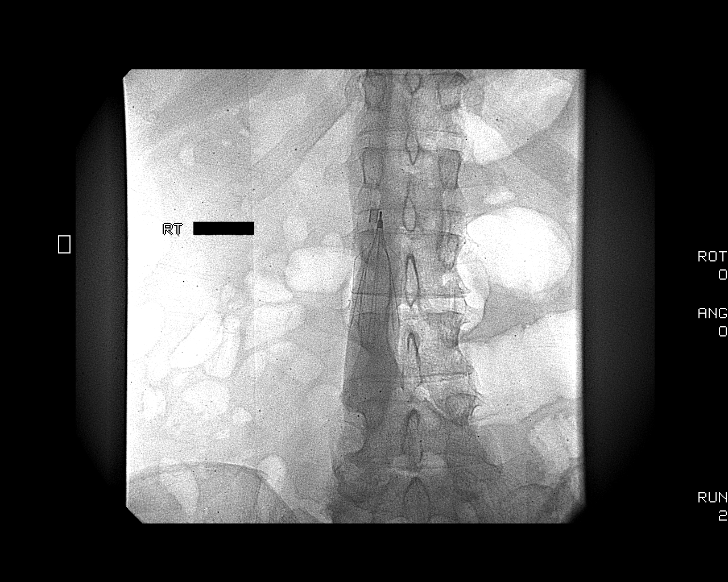
[im 8/10]
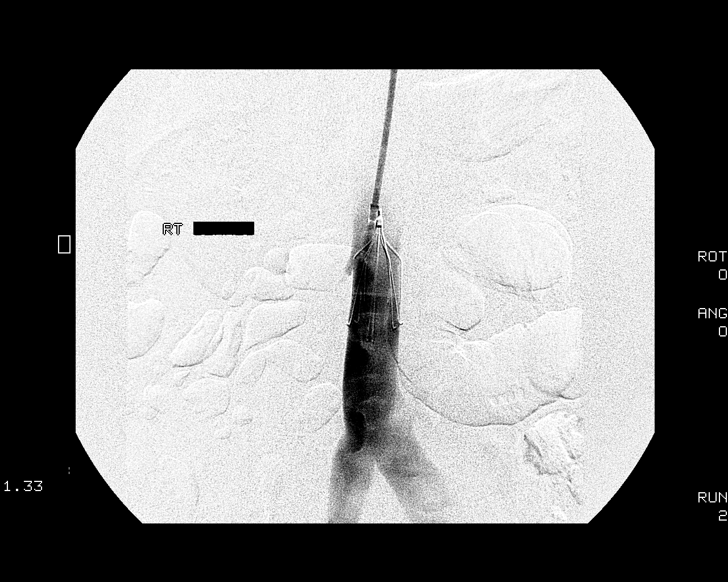
[im 9/10]
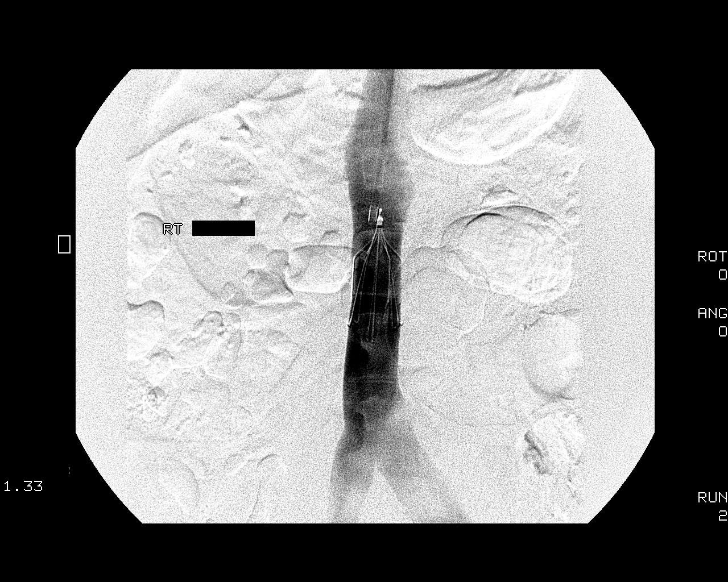
[im 10/10]
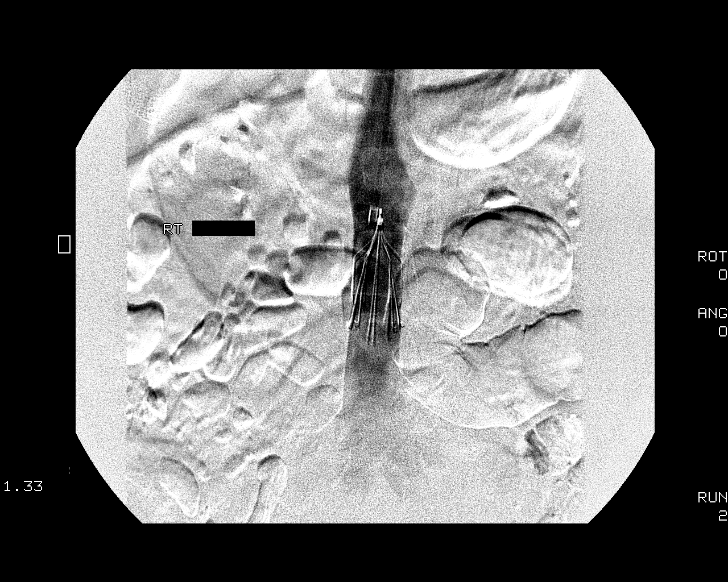

[10 of 10 positions shown; findings below may reference images not displayed]

Intravenous Fentanyl and Versed were administered as conscious
sedation during continuous cardiorespiratory monitoring by the
radiology RN, with a total moderate sedation time of ten minutes.

Under real-time ultrasound guidance, the right IJ vein was accessed
with a 21 gauge micropuncture needle; the needle tip within the vein
was confirmed with ultrasound image documentation. The needle was
exchanged over a 018 guidewire for a transitional dilator, which
allow advancement of the Benson wire into the IVC. A long 6 French
vascular sheath was placed for inferior venacavography. This
demonstrated no caval thrombus. Renal vein inflows were evident.

The Denali IVC filter was advanced through the sheath and
successfully deployed under fluoroscopy at the L2 level. Followup
cavagram demonstrates stable filter position and no evident
complication. The sheath was removed and hemostasis achieved at the
site. No immediate complication.
IMPRESSION: 1. Normal IVC. No thrombus or significant anatomic variation.
2. Technically successful infrarenal IVC filter placement. This is a
retrievable model.

## 2015-11-13 NOTE — Telephone Encounter (Signed)
Patient had recent labs which were normal. Patient needs follow up with Dr. Adrian Blackwater.

## 2015-11-22 ENCOUNTER — Encounter: Payer: Self-pay | Admitting: Infectious Disease

## 2015-11-22 ENCOUNTER — Ambulatory Visit (INDEPENDENT_AMBULATORY_CARE_PROVIDER_SITE_OTHER): Payer: Self-pay | Admitting: Infectious Disease

## 2015-11-22 VITALS — BP 111/77 | HR 91 | Temp 98.3°F | Wt 226.0 lb

## 2015-11-22 DIAGNOSIS — IMO0001 Reserved for inherently not codable concepts without codable children: Secondary | ICD-10-CM

## 2015-11-22 DIAGNOSIS — M609 Myositis, unspecified: Secondary | ICD-10-CM

## 2015-11-22 DIAGNOSIS — N951 Menopausal and female climacteric states: Secondary | ICD-10-CM

## 2015-11-22 DIAGNOSIS — M791 Myalgia: Secondary | ICD-10-CM

## 2015-11-22 DIAGNOSIS — Z21 Asymptomatic human immunodeficiency virus [HIV] infection status: Secondary | ICD-10-CM

## 2015-11-22 DIAGNOSIS — I82401 Acute embolism and thrombosis of unspecified deep veins of right lower extremity: Secondary | ICD-10-CM

## 2015-11-22 DIAGNOSIS — N939 Abnormal uterine and vaginal bleeding, unspecified: Secondary | ICD-10-CM

## 2015-11-22 DIAGNOSIS — D259 Leiomyoma of uterus, unspecified: Secondary | ICD-10-CM

## 2015-11-22 DIAGNOSIS — I10 Essential (primary) hypertension: Secondary | ICD-10-CM

## 2015-11-22 DIAGNOSIS — B20 Human immunodeficiency virus [HIV] disease: Secondary | ICD-10-CM

## 2015-11-22 NOTE — Progress Notes (Signed)
Chief complaint: concern that she could be pregnant Subjective:    Patient ID: Wanda Kane, female    DOB: April 17, 1961, 55 y.o.   MRN: [id number]  HPI   Wanda Kane is a 55 year old African American lady with HIV previous he cared for [facility] and then moved to [place].   While in [place] she apparently was on various antiretroviral regimens lastly on Isentress and complera.   She moved back in July was seen by Dr. Baxter Flattery in February. Her regimen was simplified to STRIBILD. She had tolerated this regimen without to much complaint other some nausea and loose stools.   However week prior to her seeing me in March 2014 she had  developed increasingly severe diffuse myalgias in her neck arms legs. She had similar symptoms in the past while out on antiretrovirals. She could not initially recall which antiretrovirals at apparently been blamed for the symptoms. However when I examined her allergy history he or she is listed as being allergic to tenofovir with alleged   tenofovir-induced rhabdomyolysis.  I  therefore crafted a new ARV regimen for her without TNF, namely once daily Tivicay, Edurant and Epivir. I  checked her for elevated CPK  Which was at 237. Since change to new TNF free regimen she had down titration of her CK to normal but then  back above 200 again.   She had not been seen by Rheum due to lack of insurance.  I offered further Rheum workup with labs was unrevealing  She remains happy with current regimen. She did develop a DVT and then did not tolerate anticoagulation due to excessive vaginal bleeding and ultimately had placement of a filter. The DVT developed in the context of megestrol for menstrual bleeding and she is not longer on this prothrombotic drug.  She had been sexually active with another HIV + young man who is apparently with an undetectable viral load. She was concerned that she might be pregnant and had several urine pregnancy tests that were  negative.  She is interested in becoming a Peer Support member.  She is also interested in re-engaging with REPRIEVE study  Lab Results  Component Value Date   HIV1RNAQUANT <20 11/08/2015   Lab Results  Component Value Date   CD4TABS 600 11/08/2015   CD4TABS 670 09/29/2014   CD4TABS 550 05/22/2014    Past Medical History  Diagnosis Date  . HIV infection ([place]) Dx 1994  . Fibroids 2010  . Fibroid   . DVT (deep venous thrombosis) ([place]) Jan 2016  . Hypertension 1994  . Deep vein thrombophlebitis of right leg ([place])     2016  . DVT (deep venous thrombosis) ([place])   . Vaginal bleeding 12/2014    due to xarelto  . HIV disease ([employer]) 11/22/2015    Past Surgical History  Procedure Laterality Date  . Cesarean section  1983  . Tonsillectomy and adenoidectomy    . Cesarean section      Family History  Problem Relation Age of Onset  . Hypertension Mother   . CAD Brother   . Heart disease Brother 57    cardiac arrest      Social History   Social History  . Marital Status: Single    Spouse Name: N/A  . Number of Children: N/A  . Years of Education: N/A   Social History Main Topics  . Smoking status: Never Smoker   . Smokeless tobacco: Never Used  . Alcohol Use: No  . Drug Use:  No  . Sexual Activity:    Partners: Male     Comment: patient declined condoms   Other Topics Concern  . None   Social History Narrative    Allergies  Allergen Reactions  . Viread [Tenofovir Disoproxil Fumarate] Other (See Comments)    Rhabdomyolysis     Current outpatient prescriptions:  .  amLODipine (NORVASC) 10 MG tablet, TAKE 1 TABLET BY MOUTH DAILY, Disp: 30 tablet, Rfl: 0 .  B-Complex-C TABS, Take 1 tablet by mouth daily., Disp: , Rfl:  .  dolutegravir (TIVICAY) 50 MG tablet, Take 1 tablet (50 mg total) by mouth daily., Disp: 30 tablet, Rfl: 5 .  ferrous sulfate 325 (65 FE) MG EC tablet, Take 1 tablet (325 mg total) by mouth 2 (two) times daily before a meal., Disp: 60 tablet,  Rfl: 11 .  lamivudine (EPIVIR) 300 MG tablet, TAKE 1 TABLET BY MOUTH DAILY, Disp: 30 tablet, Rfl: 5 .  lidocaine (XYLOCAINE) 2 % solution, Use as directed 20 mLs in the mouth or throat every 2 (two) hours as needed for mouth pain. Gargle and spit out, Disp: 100 mL, Rfl: 0 .  lisinopril (PRINIVIL,ZESTRIL) 10 MG tablet, Take 1 tablet (10 mg total) by mouth daily. Must have office visit for more refills, Disp: 30 tablet, Rfl: 0 .  rilpivirine (EDURANT) 25 MG TABS tablet, Take 1 tablet (25 mg total) by mouth daily with breakfast., Disp: 30 tablet, Rfl: 5 .  spironolactone (ALDACTONE) 100 MG tablet, Take 1 tablet (100 mg total) by mouth daily., Disp: 30 tablet, Rfl: 5 .  TIVICAY 50 MG tablet, TAKE 1 TABLET BY MOUTH DAILY, Disp: 30 tablet, Rfl: 4    Review of Systems  Constitutional: Negative for fever, chills, diaphoresis, activity change, appetite change, fatigue and unexpected weight change.  HENT: Negative for congestion, rhinorrhea, sinus pressure, sneezing, sore throat and trouble swallowing.   Eyes: Negative for photophobia and visual disturbance.  Respiratory: Negative for cough, chest tightness, shortness of breath, wheezing and stridor.   Cardiovascular: Negative for chest pain, palpitations and leg swelling.  Gastrointestinal: Negative for nausea, vomiting, abdominal pain, diarrhea, constipation, blood in stool, abdominal distention and anal bleeding.  Genitourinary: Negative for dysuria, hematuria, flank pain and difficulty urinating.  Musculoskeletal: Negative for myalgias, back pain, joint swelling, arthralgias, gait problem and neck stiffness.  Skin: Negative for color change, pallor, rash and wound.  Neurological: Negative for dizziness, tremors, weakness and light-headedness.  Hematological: Negative for adenopathy. Does not bruise/bleed easily.  Psychiatric/Behavioral: Negative for behavioral problems, confusion, sleep disturbance, dysphoric mood, decreased concentration and  agitation.       Objective:   Physical Exam  Constitutional: She is oriented to person, place, and time. She appears well-developed and well-nourished. No distress.  HENT:  Head: Normocephalic and atraumatic.  Mouth/Throat: Oropharynx is clear and moist. No oropharyngeal exudate.  Eyes: Conjunctivae and EOM are normal. No scleral icterus.  Neck: Normal range of motion. Neck supple. No JVD present.  Cardiovascular: Normal rate, regular rhythm and normal heart sounds.  Exam reveals no gallop and no friction rub.   No murmur heard. Pulmonary/Chest: Effort normal and breath sounds normal. No respiratory distress. She has no wheezes. She has no rales. She exhibits no tenderness.  Abdominal: She exhibits no distension and no mass. There is no tenderness. There is no rebound and no guarding.  Musculoskeletal: She exhibits no edema or tenderness.  Lymphadenopathy:    She has no cervical adenopathy.  Neurological: She is alert and oriented  to person, place, and time. She exhibits normal muscle tone. Coordination normal.  Skin: Skin is warm and dry. She is not diaphoretic. No erythema. No pallor.  Psychiatric: She has a normal mood and affect. Her behavior is normal. Judgment and thought content normal.          Assessment & Plan:    HIV: continue current regimen. She needs to renew ADAP now and in July   Myositis: not clear what is causing this but she is symptomatically better, probably NEVER had anything to do with   DVT: with a filter now and not on anticoagulation  Concern for pregnancy: check serum pregnancy test  Perimenopausal bleeding with fibroids: wonder is she should not consider hysterectomy at some point  HTN: well controlled,  Filed Vitals:   11/22/15 1610  BP: 111/77  Pulse: 91  Temp: 98.3 F (36.8 C)

## 2015-11-23 LAB — HCG, QUANTITATIVE, PREGNANCY

## 2015-11-28 ENCOUNTER — Encounter: Payer: Self-pay | Admitting: Infectious Disease

## 2015-11-28 ENCOUNTER — Ambulatory Visit: Payer: Self-pay

## 2015-11-29 ENCOUNTER — Encounter (INDEPENDENT_AMBULATORY_CARE_PROVIDER_SITE_OTHER): Payer: Self-pay | Admitting: *Deleted

## 2015-11-29 ENCOUNTER — Encounter: Payer: Self-pay | Admitting: *Deleted

## 2015-11-29 VITALS — BP 125/84 | HR 73 | Temp 97.8°F | Resp 16 | Wt 227.5 lb

## 2015-11-29 DIAGNOSIS — Z006 Encounter for examination for normal comparison and control in clinical research program: Secondary | ICD-10-CM

## 2015-11-29 LAB — LIPID PANEL
Cholesterol: 237 mg/dL — ABNORMAL HIGH (ref 125–200)
HDL: 63 mg/dL (ref >=46)
LDL Cholesterol: 117 mg/dL (ref <130)
Total CHOL/HDL Ratio: 3.8 Ratio (ref <=5.0)
Triglycerides: 284 mg/dL — ABNORMAL HIGH (ref <150)
VLDL: 57 mg/dL — AB (ref <30)

## 2015-11-30 LAB — HCG, SERUM, QUALITATIVE: Preg, Serum: NEGATIVE

## 2015-12-07 NOTE — Progress Notes (Signed)
Wanda Kane is here for rescreening into A5332 - Randomized Trial to prevent Vascular Events in HIV (REPRIEVE study). We reviewed the informed consent together. I fully explained the consent, risks, benefits, responsibilities, and answered questions. Participant verbalized understanding and signed the consent witnessed by me. I gave a copy of the signed consent to participant.   Reason for screen fail ultimately was a dx of DVT early 2016. IVC filter was placed in 12/2014 when she had an adverse reaction to anticoagulant therapy (xarelto) causing heavy vaginal bleeding and source of this is thought to be coming from uterine fibroids. Hgb was in 6's and received 1 unit of blood. All have resolved; Hgb is back to normal levels; Myositis and myalgias resolved in May 2016 (etiology not defined). We reviewed other medical history, medications, and signs/symptoms. Discussed cardiovascular family history and obtained blood sample for fasting lipids. Vitals are normal. Currently not having a menstrual cycle but have become irregular. Currently sexually active with HIV+ female partner. Was concerned recently for possible pregnancy but was confirmed to be negative. We discussed birth control choices and the importance for her to maintain these choices. We will also discuss again at entry. PE unremarkable and details in study chart. She received $50 gift card for visit. If eligible she will come in on 12/11/15 for entry. Eliezer Champagne RN

## 2015-12-10 ENCOUNTER — Other Ambulatory Visit: Payer: Self-pay | Admitting: Family Medicine

## 2015-12-10 ENCOUNTER — Ambulatory Visit: Payer: Self-pay

## 2015-12-11 ENCOUNTER — Encounter (INDEPENDENT_AMBULATORY_CARE_PROVIDER_SITE_OTHER): Payer: Self-pay | Admitting: *Deleted

## 2015-12-11 VITALS — BP 119/83 | HR 77 | Temp 98.0°F | Resp 16 | Wt 229.2 lb

## 2015-12-11 DIAGNOSIS — Z006 Encounter for examination for normal comparison and control in clinical research program: Secondary | ICD-10-CM

## 2015-12-11 LAB — POCT URINE PREGNANCY: PREG TEST UR: NEGATIVE

## 2015-12-11 MED ORDER — PITAVASTATIN CALCIUM 4 MG PO TABS: 1.0000 | ORAL_TABLET | Freq: Every day | ORAL | Status: DC

## 2015-12-11 NOTE — Progress Notes (Signed)
Wanda Kane enrolled on Reprieve, A Randomized Trial to Prevent Vascular Events in HIV (study drug is Pitavastatin 4mg  or placebo) after we verified her eligibility. She denies any new problems since screening except for a headache this morning which she says may be related to her menstrual cycle. She is having very irregular and lengthy periods up to 11-12 days at a time. A urine pregnancy test was done prior to the enrollment which was negative. She was instructed on dosing, adherence, side effects of the pitavastatin/placebo study drug. She will call if she develops any bad muscle aches, rash or other severe complaint that she thinks is related to the medication. She plans to start the drug today. An EKG was done for study which was WNL. She will return in 1 month for the next study visit.

## 2015-12-11 NOTE — Addendum Note (Signed)
Addended by: Reggy Eye on: 12/11/2015 04:26 PM   Modules accepted: Orders

## 2015-12-11 NOTE — Addendum Note (Signed)
Addended by: Bobbie Stack on: 12/11/2015 04:17 PM   Modules accepted: Orders

## 2015-12-12 NOTE — Telephone Encounter (Signed)
Spoke with pt in regards to recertification/meds;pt states she understands what needs to happen

## 2015-12-17 ENCOUNTER — Telehealth: Payer: Self-pay | Admitting: *Deleted

## 2015-12-17 NOTE — Telephone Encounter (Signed)
Oh well 

## 2015-12-17 NOTE — Telephone Encounter (Signed)
Wanda Kane called and left a message saying that she had decided to stop the medication (pitavastatin) since her muscle aches were getting worse from last week. She started the drug on 1/24 and had let me know on the 26th that she was noticing some aches in her lower extremities, but she would keep trying to take it. She called late last night and said she was just going to stop it because the aches were getting worse. She was also having weird dreams, stomach didn't feel right and headaches had increased. She did take the last dose yesterday, the 29th. I am waiting for her to call me back to discuss and I will contact the study team about her decision once I hear from her.

## 2015-12-20 ENCOUNTER — Other Ambulatory Visit: Payer: Self-pay | Admitting: Infectious Disease

## 2015-12-20 DIAGNOSIS — B20 Human immunodeficiency virus [HIV] disease: Secondary | ICD-10-CM

## 2016-01-08 ENCOUNTER — Encounter (INDEPENDENT_AMBULATORY_CARE_PROVIDER_SITE_OTHER): Payer: Self-pay | Admitting: *Deleted

## 2016-01-08 VITALS — BP 141/88 | HR 79 | Temp 98.1°F | Resp 18 | Wt 227.5 lb

## 2016-01-08 DIAGNOSIS — Z006 Encounter for examination for normal comparison and control in clinical research program: Secondary | ICD-10-CM

## 2016-01-08 LAB — COMPREHENSIVE METABOLIC PANEL
ALK PHOS: 83 U/L (ref 33–130)
ALT: 20 U/L (ref 6–29)
AST: 30 U/L (ref 10–35)
Albumin: 4.2 g/dL (ref 3.6–5.1)
BUN: 13 mg/dL (ref 7–25)
CALCIUM: 9.9 mg/dL (ref 8.6–10.4)
CO2: 26 mmol/L (ref 20–31)
Chloride: 102 mmol/L (ref 98–110)
Creat: 0.84 mg/dL (ref 0.50–1.05)
GLUCOSE: 111 mg/dL — AB (ref 65–99)
POTASSIUM: 4 mmol/L (ref 3.5–5.3)
Sodium: 137 mmol/L (ref 135–146)
Total Bilirubin: 0.6 mg/dL (ref 0.2–1.2)
Total Protein: 7.4 g/dL (ref 6.1–8.1)

## 2016-01-08 NOTE — Progress Notes (Signed)
Wanda Kane is here today for a visit for Reprieve, A Randomized Trial to Prevent Vascular Events in HIV (study drug is Pitavastatin 4mg  or placebo). She resumed her pitavastatin/placebo yesterday and has noticed a headache and some muscle twitches since restarting. She is willing to continue the med for now and will let me know if it gets worse and she can't tolerate it. If so, she will need to come in for a premature treatment discontinuation visit. She said all the side effects she was having after starting the drug stopped within 9 days of stopping the med. She denies any other problems or issues right now. Denies any current muscle aches or weakness. She is scheduled to return in 3 months.

## 2016-01-09 ENCOUNTER — Other Ambulatory Visit: Payer: Self-pay | Admitting: Family Medicine

## 2016-01-09 LAB — HCG, SERUM, QUALITATIVE: Preg, Serum: NEGATIVE

## 2016-01-10 ENCOUNTER — Other Ambulatory Visit: Payer: Self-pay | Admitting: Family Medicine

## 2016-01-10 MED ORDER — LISINOPRIL 10 MG PO TABS: 10.0000 mg | ORAL_TABLET | Freq: Every day | ORAL | Status: DC

## 2016-01-31 ENCOUNTER — Encounter: Payer: Self-pay | Admitting: Infectious Disease

## 2016-02-05 ENCOUNTER — Other Ambulatory Visit: Payer: Self-pay | Admitting: Family Medicine

## 2016-02-05 ENCOUNTER — Other Ambulatory Visit: Payer: Self-pay | Admitting: Infectious Disease

## 2016-02-05 DIAGNOSIS — B20 Human immunodeficiency virus [HIV] disease: Secondary | ICD-10-CM

## 2016-03-03 ENCOUNTER — Telehealth: Payer: Self-pay

## 2016-03-03 NOTE — Telephone Encounter (Signed)
[place] form back explaining patient will need an appointment to receive [place] and ADAP.

## 2016-03-12 ENCOUNTER — Encounter: Payer: Self-pay | Admitting: Infectious Disease

## 2016-03-14 ENCOUNTER — Other Ambulatory Visit: Payer: Self-pay | Admitting: *Deleted

## 2016-03-14 DIAGNOSIS — B20 Human immunodeficiency virus [HIV] disease: Secondary | ICD-10-CM

## 2016-03-14 MED ORDER — LAMIVUDINE 300 MG PO TABS: 300.0000 mg | ORAL_TABLET | Freq: Every day | ORAL | Status: DC

## 2016-03-14 MED ORDER — DOLUTEGRAVIR SODIUM 50 MG PO TABS: 50.0000 mg | ORAL_TABLET | Freq: Every day | ORAL | Status: DC

## 2016-03-14 MED ORDER — RILPIVIRINE HCL 25 MG PO TABS: 25.0000 mg | ORAL_TABLET | Freq: Every day | ORAL | Status: DC

## 2016-03-24 ENCOUNTER — Encounter: Payer: Self-pay | Admitting: Infectious Disease

## 2016-04-01 ENCOUNTER — Encounter (INDEPENDENT_AMBULATORY_CARE_PROVIDER_SITE_OTHER): Payer: Self-pay | Admitting: *Deleted

## 2016-04-01 VITALS — BP 126/82 | HR 88 | Temp 98.3°F | Wt 233.2 lb

## 2016-04-01 DIAGNOSIS — Z006 Encounter for examination for normal comparison and control in clinical research program: Secondary | ICD-10-CM

## 2016-04-01 LAB — POCT URINE PREGNANCY: Preg Test, Ur: NEGATIVE

## 2016-04-01 NOTE — Progress Notes (Signed)
Wanda Kane here for mont 4 visit A Randomized Trial to Prevent Vascular Events in HIV (The REPRIEVE Study). No new complaints or concerns verbalized. States that the muscle twitching in her LE's and headaches that she was experiencing at her last study visit resolved shortly after that visit and that she has not had any problems since. States that she would like to continue to participate in the study at this time.States excellent adherence with her medication. She will return in September for her next study visit.

## 2016-04-02 ENCOUNTER — Other Ambulatory Visit: Payer: Self-pay | Admitting: Infectious Disease

## 2016-04-04 ENCOUNTER — Encounter: Payer: Self-pay | Admitting: Infectious Disease

## 2016-04-07 ENCOUNTER — Other Ambulatory Visit: Payer: Self-pay | Admitting: Family Medicine

## 2016-04-07 ENCOUNTER — Encounter: Payer: Self-pay | Admitting: Family Medicine

## 2016-04-07 DIAGNOSIS — I10 Essential (primary) hypertension: Secondary | ICD-10-CM

## 2016-04-07 MED ORDER — AMLODIPINE BESYLATE 10 MG PO TABS: 10.0000 mg | ORAL_TABLET | Freq: Every day | ORAL | Status: DC

## 2016-04-07 MED ORDER — LISINOPRIL 10 MG PO TABS: 10.0000 mg | ORAL_TABLET | Freq: Every day | ORAL | Status: DC

## 2016-04-10 ENCOUNTER — Inpatient Hospital Stay (HOSPITAL_COMMUNITY)
Admission: AD | Admit: 2016-04-10 | Discharge: 2016-04-10 | Disposition: A | Discharge status: Home / Self Care | Payer: Self-pay | Source: Ambulatory Visit | Attending: Obstetrics & Gynecology | Admitting: Obstetrics & Gynecology

## 2016-04-10 ENCOUNTER — Encounter (HOSPITAL_COMMUNITY): Payer: Self-pay

## 2016-04-10 DIAGNOSIS — B20 Human immunodeficiency virus [HIV] disease: Secondary | ICD-10-CM | POA: Insufficient documentation

## 2016-04-10 DIAGNOSIS — D259 Leiomyoma of uterus, unspecified: Secondary | ICD-10-CM | POA: Insufficient documentation

## 2016-04-10 DIAGNOSIS — Z86718 Personal history of other venous thrombosis and embolism: Secondary | ICD-10-CM | POA: Insufficient documentation

## 2016-04-10 DIAGNOSIS — Z8249 Family history of ischemic heart disease and other diseases of the circulatory system: Secondary | ICD-10-CM | POA: Insufficient documentation

## 2016-04-10 DIAGNOSIS — I10 Essential (primary) hypertension: Secondary | ICD-10-CM | POA: Insufficient documentation

## 2016-04-10 DIAGNOSIS — N939 Abnormal uterine and vaginal bleeding, unspecified: Secondary | ICD-10-CM | POA: Insufficient documentation

## 2016-04-10 LAB — CBC
HCT: 36.8 % (ref 36.0–46.0)
Hemoglobin: 12.7 g/dL (ref 12.0–15.0)
MCH: 31 pg (ref 26.0–34.0)
MCHC: 34.5 g/dL (ref 30.0–36.0)
MCV: 89.8 fL (ref 78.0–100.0)
PLATELETS: 281 10*3/uL (ref 150–400)
RBC: 4.1 MIL/uL (ref 3.87–5.11)
RDW: 14 % (ref 11.5–15.5)
WBC: 11.6 10*3/uL — AB (ref 4.0–10.5)

## 2016-04-10 MED ORDER — ONDANSETRON 8 MG PO TBDP
8.0000 mg | ORAL_TABLET | Freq: Once | ORAL | Status: AC
  Administered 2016-04-10: 8 mg via ORAL
  Filled 2016-04-10: qty 1

## 2016-04-10 NOTE — MAU Note (Signed)
Pt presents complaining of heavy vaginal bleeding that started at 3am. Passing large clots. This happened a year ago and had to get blood. Had some lightheadedness and was concerned.

## 2016-04-10 NOTE — MAU Provider Note (Signed)
History     CSN: [id number]  Arrival date and time: 04/10/16 [id number]   First Provider Initiated Contact with Patient 04/10/16 [phone]      Chief Complaint  Patient presents with  . Vaginal Bleeding   HPI [name] is a 55 y.o. G1P1001 who presents to MAU today with complaint of heavy vaginal bleeding. The patient states that she had fibroids and has had peri-menopausal type bleeding for a while. She states a similar heavy bleeding episode about 1 year ago requiring blood transfusion. She states that LMP prior to this was in February. She started bleeding heavily around 0300 today and has soaked 2-3 pads since then. She did feel lightheaded initially, but now denies any weakness or dizziness. She has also been passing large blots. Although her periods are usually heavy, this is heavier than usual and with more large clots. She denies abdominal pain, but is having some nausea without vomiting. She was given Megace last year and tried it for a short term, but stopped taking it because she was concerned it was causing migraines and had not improved her bleeding much. She now realizes that this may also have been a side effect of the Xerelto.   OB History    Gravida Para Term Preterm AB TAB SAB Ectopic Multiple Living   1 1 1  0 0 0 0 0 0 1      Past Medical History  Diagnosis Date  . HIV infection ([place]) Dx 1994  . Fibroids 2010  . Fibroid   . DVT (deep venous thrombosis) ([place]) Jan 2016  . Hypertension 1994  . Deep vein thrombophlebitis of right leg ([place])     2016  . DVT (deep venous thrombosis) ([place])   . Vaginal bleeding 12/2014    due to xarelto  . HIV disease ([place]) 11/22/2015    Past Surgical History  Procedure Laterality Date  . Cesarean section  1983  . Tonsillectomy and adenoidectomy    . Cesarean section      Family History  Problem Relation Age of Onset  . Hypertension Mother   . CAD Brother   . Heart disease Brother 83    cardiac arrest    Social History   Substance Use Topics  . Smoking status: Never Smoker   . Smokeless tobacco: Never Used  . Alcohol Use: No    Allergies:  Allergies  Allergen Reactions  . Viread [Tenofovir Disoproxil Fumarate] Other (See Comments)    Rhabdomyolysis    Prescriptions prior to admission  Medication Sig Dispense Refill Last Dose  . amLODipine (NORVASC) 10 MG tablet Take 1 tablet (10 mg total) by mouth daily. 30 tablet 0   . B-Complex-C TABS Take 1 tablet by mouth daily.   Taking  . dolutegravir (TIVICAY) 50 MG tablet Take 1 tablet (50 mg total) by mouth daily. 30 tablet 5   . ferrous sulfate 325 (65 FE) MG EC tablet Take 1 tablet (325 mg total) by mouth 2 (two) times daily before a meal. 60 tablet 11 Taking  . lamivudine (EPIVIR) 300 MG tablet Take 1 tablet (300 mg total) by mouth daily. 30 tablet 5   . lidocaine (XYLOCAINE) 2 % solution Use as directed 20 mLs in the mouth or throat every 2 (two) hours as needed for mouth pain. Gargle and spit out 100 mL 0 Taking  . lisinopril (PRINIVIL,ZESTRIL) 10 MG tablet Take 1 tablet (10 mg total) by mouth daily. 30 tablet 0   . Pitavastatin Calcium  4 MG TABS Take 1 tablet (4 mg total) by mouth daily. This is study provided and may be placebo. Do not fill prescription. 30 tablet 11   . rilpivirine (EDURANT) 25 MG TABS tablet Take 1 tablet (25 mg total) by mouth daily with breakfast. 30 tablet 5   . spironolactone (ALDACTONE) 100 MG tablet Take 1 tablet (100 mg total) by mouth daily. Must have OV 30 tablet 0     Review of Systems  Constitutional: Negative for fever and malaise/fatigue.  Gastrointestinal: Negative for abdominal pain.  Genitourinary:       + vaginal bleeding  Neurological: Negative for dizziness, loss of consciousness and weakness.   Physical Exam   Blood pressure 129/72, pulse 98, temperature 98 F (36.7 C), temperature source Oral, resp. rate 18, last menstrual period 04/09/2016.  Physical Exam  Nursing note and vitals  reviewed. Constitutional: She is oriented to person, place, and time. She appears well-developed and well-nourished. No distress.  HENT:  Head: Normocephalic and atraumatic.  Cardiovascular: Normal rate.   Respiratory: Effort normal.  GI: Soft. She exhibits no distension and no mass. There is no tenderness. There is no rebound and no guarding.  Genitourinary: Uterus is enlarged. Uterus is not tender. Cervix exhibits no motion tenderness, no discharge and no friability. Right adnexum displays no mass and no tenderness. Left adnexum displays no mass and no tenderness. There is bleeding (moderate blood with multiple large clots) in the vagina. No vaginal discharge found.  Neurological: She is alert and oriented to person, place, and time.  Skin: Skin is warm and dry. No erythema.  Psychiatric: She has a normal mood and affect.    Results for orders placed or performed during the hospital encounter of 04/10/16 (from the past 24 hour(s))  CBC     Status: Abnormal   Collection Time: 04/10/16  6:32 AM  Result Value Ref Range   WBC 11.6 (H) 4.0 - 10.5 K/uL   RBC 4.10 3.87 - 5.11 MIL/uL   Hemoglobin 12.7 12.0 - 15.0 g/dL   HCT 36.8 36.0 - 46.0 %   MCV 89.8 78.0 - 100.0 fL   MCH 31.0 26.0 - 34.0 pg   MCHC 34.5 30.0 - 36.0 g/dL   RDW 14.0 11.5 - 15.5 %   Platelets 281 150 - 400 K/uL    MAU Course  Procedures None  MDM UPT ordered. Unable to get sample. UPT negative 5/16 CBC today  Patient is hemodynamically stable today Has Megace at home, will trial again as side effects may have been caused by Wanda Kane in the past Assessment and Plan  A: Abnormal uterine bleeding  Uterine Fibroids  P: Discharge home Advised to restart Megace at 40 mg BID Bleeding precautions discussed Patient advised to follow-up with [facility] on 04/16/16 at 1:00 pm for further management Patient may return to MAU as needed or if her condition were to change or worsen   Luvenia Redden, PA-C  04/10/2016, 7:11 AM

## 2016-04-10 NOTE — Discharge Instructions (Signed)
Abnormal Uterine Bleeding Abnormal uterine bleeding means bleeding from the vagina that is not your normal menstrual period. This can be:  Bleeding or spotting between periods.  Bleeding after sex (sexual intercourse).  Bleeding that is heavier or more than normal.  Periods that last longer than usual.  Bleeding after menopause. There are many problems that may cause this. Treatment will depend on the cause of the bleeding. Any kind of bleeding that is not normal should be reviewed by your doctor.  HOME CARE Watch your condition for any changes. These actions may lessen any discomfort you are having:  Do not use tampons or douches as told by your doctor.  Change your pads often. You should get regular pelvic exams and Pap tests. Keep all appointments for tests as told by your doctor. GET HELP IF:  You are bleeding for more than 1 week.  You feel dizzy at times. GET HELP RIGHT AWAY IF:   You pass out.  You have to change pads every 15 to 30 minutes.  You have belly pain.  You have a fever.  You become sweaty or weak.  You are passing large blood clots from the vagina.  You feel sick to your stomach (nauseous) and throw up (vomit). MAKE SURE YOU:  Understand these instructions.  Will watch your condition.  Will get help right away if you are not doing well or get worse.   This information is not intended to replace advice given to you by your health care provider. Make sure you discuss any questions you have with your health care provider.   Document Released: 08/31/2009 Document Revised: 11/08/2013 Document Reviewed: 06/02/2013 Elsevier Interactive Patient Education [employer].

## 2016-04-16 ENCOUNTER — Encounter: Payer: Self-pay | Admitting: Obstetrics & Gynecology

## 2016-04-16 ENCOUNTER — Ambulatory Visit (INDEPENDENT_AMBULATORY_CARE_PROVIDER_SITE_OTHER): Payer: Self-pay | Admitting: Obstetrics & Gynecology

## 2016-04-16 VITALS — BP 130/71 | HR 103 | Wt 226.0 lb

## 2016-04-16 DIAGNOSIS — N939 Abnormal uterine and vaginal bleeding, unspecified: Secondary | ICD-10-CM

## 2016-04-16 DIAGNOSIS — D259 Leiomyoma of uterus, unspecified: Secondary | ICD-10-CM

## 2016-04-16 LAB — CBC
HCT: 31.2 % — ABNORMAL LOW (ref 35.0–45.0)
Hemoglobin: 10.6 g/dL — ABNORMAL LOW (ref 11.7–15.5)
MCH: 30.7 pg (ref 27.0–33.0)
MCHC: 34 g/dL (ref 32.0–36.0)
MCV: 90.4 fL (ref 80.0–100.0)
MPV: 10.7 fL (ref 7.5–12.5)
PLATELETS: 465 10*3/uL — AB (ref 140–400)
RBC: 3.45 MIL/uL — AB (ref 3.80–5.10)
RDW: 14 % (ref 11.0–15.0)
WBC: 11.1 10*3/uL — ABNORMAL HIGH (ref 3.8–10.8)

## 2016-04-16 LAB — TSH: TSH: 1.17 m[IU]/L

## 2016-04-16 MED ORDER — MEGESTROL ACETATE 20 MG PO TABS: 40.0000 mg | ORAL_TABLET | Freq: Every day | ORAL | Status: DC

## 2016-04-16 NOTE — Progress Notes (Signed)
Patient ID: Wanda Kane, female   DOB: [birth year], 55 y.o.   MRN: [id number] History:  55 y.o. G1P1001 here today for pt s/p DVT 11/2013.  Pt with oligomenorreha but, when cycles come they are heavy.  Pt reports spotting only in March. In May pt had bleeding that was heavy. She reports that it was like a flood. She was prev on Xeralto after a DVT of unknown etiology- possibly caused by shoe/boot trauma.  S/p IVC filter 12/2014.  Pt with significant AUB while on anticoagulants. Was on Megace for 1 week prev and stopped due to the bleeding issue.  May 24th which was very heavy.  Pt has not gone a year without a cycle. An attempt was made to do a endobx in the office without success due to the position of the fibroids.     The following portions of the patient's history were reviewed and updated as appropriate: allergies, current medications, past family history, past medical history, past social history, past surgical history and problem list.  Review of Systems:  Pertinent items are noted in HPI.  Objective:  Physical Exam Blood pressure 130/71, pulse 103, weight 226 lb (102.513 kg), last menstrual period 04/09/2016. Gen: NAD Lungs: CTA CV: RRR Abd: obese, NT, ND Ext: no edema  CBC    Component Value Date/Time   WBC 11.1* 04/16/2016 0001   RBC 3.45* 04/16/2016 0001   HGB 10.6* 04/16/2016 0001   HCT 31.2* 04/16/2016 0001   PLT 465* 04/16/2016 0001   MCV 90.4 04/16/2016 0001   MCH 30.7 04/16/2016 0001   MCHC 34.0 04/16/2016 0001   RDW 14.0 04/16/2016 0001   LYMPHSABS 3.2 11/08/2015 0935   MONOABS 0.5 11/08/2015 0935   EOSABS 0.1 11/08/2015 0935   BASOSABS 0.1 11/08/2015 0935       Labs and Imaging 12/22/2013 CLINICAL DATA: Menorrhagia, fibroids  EXAM: TRANSABDOMINAL AND TRANSVAGINAL ULTRASOUND OF PELVIS  TECHNIQUE: Both transabdominal and transvaginal ultrasound examinations of the pelvis were performed. Transabdominal technique was performed for global imaging of the  pelvis including uterus, ovaries, adnexal regions, and pelvic cul-de-sac. It was necessary to proceed with endovaginal exam following the transabdominal exam to visualize the bilateral adnexa.  COMPARISON: None  FINDINGS: Uterus  Measurements: 10.2 x 2.4 x 7.6 cm. Dominant 5.9 x 4.7 x 4.7 cm transmural fundal fibroid. Additional 2.8 x 2.6 x 3.0 cm subserosal left posterior uterine body fibroid.  Endometrium  Poorly visualized due to uterine fibroids.  Right ovary  Not visualized transabdominally or transvaginally.  Left ovary  Not visualized transabdominally or transvaginally.  Other findings  No free fluid.  IMPRESSION: Uterine fibroids, including a dominant 5.9 cm fundal fibroid, as described above.  Bilateral ovaries are not discretely visualized.   Assessment & Plan:  AUB due to fibroids Improved on Megace 40mg  bid x 1 week.  Suspect perimenopause.  Labs; CBC adm TSH [place] to eval endometrial stripe Pt declined ofc endobx due to prior failure in ofc F/u in 3 months  Wanda Kane, M.D., Wanda Kane

## 2016-04-16 NOTE — Patient Instructions (Signed)
Perimenopause Perimenopause is the time when your body begins to move into the menopause (no menstrual period for 12 straight months). It is a natural process. Perimenopause can begin 2-8 years before the menopause and usually lasts for 1 year after the menopause. During this time, your ovaries may or may not produce an egg. The ovaries vary in their production of estrogen and progesterone hormones each month. This can cause irregular menstrual periods, difficulty getting pregnant, vaginal bleeding between periods, and uncomfortable symptoms. CAUSES  Irregular production of the ovarian hormones, estrogen and progesterone, and not ovulating every month.  Other causes include:  Tumor of the pituitary gland in the brain.  Medical disease that affects the ovaries.  Radiation treatment.  Chemotherapy.  Unknown causes.  Heavy smoking and excessive alcohol intake can bring on perimenopause sooner. SIGNS AND SYMPTOMS   Hot flashes.  Night sweats.  Irregular menstrual periods.  Decreased sex drive.  Vaginal dryness.  Headaches.  Mood swings.  Depression.  Memory problems.  Irritability.  Tiredness.  Weight gain.  Trouble getting pregnant.  The beginning of losing bone cells (osteoporosis).  The beginning of hardening of the arteries (atherosclerosis). DIAGNOSIS  Your health care provider will make a diagnosis by analyzing your age, menstrual history, and symptoms. He or she will do a physical exam and note any changes in your body, especially your female organs. Female hormone tests may or may not be helpful depending on the amount of female hormones you produce and when you produce them. However, other hormone tests may be helpful to rule out other problems. TREATMENT  In some cases, no treatment is needed. The decision on whether treatment is necessary during the perimenopause should be made by you and your health care provider based on how the symptoms are affecting you  and your lifestyle. Various treatments are available, such as:  Treating individual symptoms with a specific medicine for that symptom.  Herbal medicines that can help specific symptoms.  Counseling.  Group therapy. HOME CARE INSTRUCTIONS   Keep track of your menstrual periods (when they occur, how heavy they are, how long between periods, and how long they last) as well as your symptoms and when they started.  Only take over-the-counter or prescription medicines as directed by your health care provider.  Sleep and rest.  Exercise.  Eat a diet that contains calcium (good for your bones) and soy (acts like the estrogen hormone).  Do not smoke.  Avoid alcoholic beverages.  Take vitamin supplements as recommended by your health care provider. Taking vitamin E may help in certain cases.  Take calcium and vitamin D supplements to help prevent bone loss.  Group therapy is sometimes helpful.  Acupuncture may help in some cases. SEEK MEDICAL CARE IF:   You have questions about any symptoms you are having.  You need a referral to a specialist (gynecologist, psychiatrist, or psychologist). SEEK IMMEDIATE MEDICAL CARE IF:   You have vaginal bleeding.  Your period lasts longer than 8 days.  Your periods are recurring sooner than 21 days.  You have bleeding after intercourse.  You have severe depression.  You have pain when you urinate.  You have severe headaches.  You have vision problems.   This information is not intended to replace advice given to you by your health care provider. Make sure you discuss any questions you have with your health care provider.   Document Released: 12/11/2004 Document Revised: 11/24/2014 Document Reviewed: 06/02/2013 Elsevier Interactive Patient Education [employer].  Uterine Fibroids Uterine fibroids are tissue masses (tumors) that can develop in the womb (uterus). They are also called leiomyomas. This type of tumor is not  cancerous (benign) and does not spread to other parts of the body outside of the pelvic area, which is between the hip bones. Occasionally, fibroids may develop in the fallopian tubes, in the cervix, or on the support structures (ligaments) that surround the uterus. You can have one or many fibroids. Fibroids can vary in size, weight, and where they grow in the uterus. Some can become quite large. Most fibroids do not require medical treatment. CAUSES A fibroid can develop when a single uterine cell keeps growing (replicating). Most cells in the human body have a control mechanism that keeps them from replicating without control. SIGNS AND SYMPTOMS Symptoms may include:   Heavy bleeding during your period.  Bleeding or spotting between periods.  Pelvic pain and pressure.  Bladder problems, such as needing to urinate more often (urinary frequency) or urgently.  Inability to reproduce offspring (infertility).  Miscarriages. DIAGNOSIS Uterine fibroids are diagnosed through a physical exam. Your health care provider may feel the lumpy tumors during a pelvic exam. Ultrasonography and an MRI may be done to determine the size, location, and number of fibroids. TREATMENT Treatment may include:  Watchful waiting. This involves getting the fibroid checked by your health care provider to see if it grows or shrinks. Follow your health care provider's recommendations for how often to have this checked.  Hormone medicines. These can be taken by mouth or given through an intrauterine device (IUD).  Surgery.  Removing the fibroids (myomectomy) or the uterus (hysterectomy).  Removing blood supply to the fibroids (uterine artery embolization). If fibroids interfere with your fertility and you want to become pregnant, your health care provider may recommend having the fibroids removed.  HOME CARE INSTRUCTIONS  Keep all follow-up visits as directed by your health care provider. This is  important.  Take medicines only as directed by your health care provider.  If you were prescribed a hormone treatment, take the hormone medicines exactly as directed.  Do not take aspirin, because it can cause bleeding.  Ask your health care provider about taking iron pills and increasing the amount of dark green, leafy vegetables in your diet. These actions can help to boost your blood iron levels, which may be affected by heavy menstrual bleeding.  Pay close attention to your period and tell your health care provider about any changes, such as:  Increased blood flow that requires you to use more pads or tampons than usual per month.  A change in the number of days that your period lasts per month.  A change in symptoms that are associated with your period, such as abdominal cramping or back pain. SEEK MEDICAL CARE IF:  You have pelvic pain, back pain, or abdominal cramps that cannot be controlled with medicines.  You have an increase in bleeding between and during periods.  You soak tampons or pads in a half hour or less.  You feel lightheaded, extra tired, or weak. SEEK IMMEDIATE MEDICAL CARE IF:  You faint.  You have a sudden increase in pelvic pain.   This information is not intended to replace advice given to you by your health care provider. Make sure you discuss any questions you have with your health care provider.   Document Released: 10/31/2000 Document Revised: 11/24/2014 Document Reviewed: 05/02/2014 Elsevier Interactive Patient Education [employer].

## 2016-04-25 ENCOUNTER — Ambulatory Visit (HOSPITAL_COMMUNITY)
Admission: RE | Admit: 2016-04-25 | Discharge: 2016-04-25 | Disposition: A | Discharge status: Home / Self Care | Payer: Self-pay | Source: Ambulatory Visit | Attending: Obstetrics & Gynecology | Admitting: Obstetrics & Gynecology

## 2016-04-25 ENCOUNTER — Encounter: Payer: Self-pay | Admitting: Family Medicine

## 2016-04-25 ENCOUNTER — Ambulatory Visit: Payer: Self-pay | Attending: Family Medicine | Admitting: Family Medicine

## 2016-04-25 ENCOUNTER — Other Ambulatory Visit: Payer: Self-pay | Admitting: Infectious Diseases

## 2016-04-25 VITALS — BP 124/80 | HR 85 | Temp 98.8°F | Resp 16 | Ht 65.0 in | Wt 229.0 lb

## 2016-04-25 DIAGNOSIS — I10 Essential (primary) hypertension: Secondary | ICD-10-CM | POA: Insufficient documentation

## 2016-04-25 DIAGNOSIS — D259 Leiomyoma of uterus, unspecified: Secondary | ICD-10-CM | POA: Insufficient documentation

## 2016-04-25 DIAGNOSIS — Z79899 Other long term (current) drug therapy: Secondary | ICD-10-CM | POA: Insufficient documentation

## 2016-04-25 DIAGNOSIS — N939 Abnormal uterine and vaginal bleeding, unspecified: Secondary | ICD-10-CM | POA: Insufficient documentation

## 2016-04-25 DIAGNOSIS — B20 Human immunodeficiency virus [HIV] disease: Secondary | ICD-10-CM

## 2016-04-25 MED ORDER — AMLODIPINE BESYLATE 10 MG PO TABS: 10.0000 mg | ORAL_TABLET | Freq: Every day | ORAL | Status: DC

## 2016-04-25 MED ORDER — SPIRONOLACTONE 100 MG PO TABS: 100.0000 mg | ORAL_TABLET | Freq: Every day | ORAL | Status: DC

## 2016-04-25 MED ORDER — LISINOPRIL 10 MG PO TABS: 10.0000 mg | ORAL_TABLET | Freq: Every day | ORAL | Status: DC

## 2016-04-25 NOTE — Progress Notes (Signed)
Subjective:  Patient ID: Wanda Kane, female    DOB: Sep 27, 1961  Age: 55 y.o. MRN: [id number]  CC: Hypertension   HPI Wanda Kane has HIV and menorrhagia she presents for   1. CHRONIC HYPERTENSION  Disease Monitoring  Blood pressure range: not checking   Chest pain: no   Dyspnea: no   Claudication:no  Medication compliance: yes  Medication Side Effects  Lightheadedness: no   Urinary frequency: no   Edema: no    2. Skin tags: on neck and between breast. Small. She would like removal for cosmetic reasons.   Social History  Substance Use Topics  . Smoking status: Never Smoker   . Smokeless tobacco: Never Used  . Alcohol Use: No    Outpatient Prescriptions Prior to Visit  Medication Sig Dispense Refill  . amLODipine (NORVASC) 10 MG tablet Take 1 tablet (10 mg total) by mouth daily. 30 tablet 0  . B-Complex-C TABS Take 1 tablet by mouth daily.    . dolutegravir (TIVICAY) 50 MG tablet Take 1 tablet (50 mg total) by mouth daily. 30 tablet 5  . ferrous sulfate 325 (65 FE) MG EC tablet Take 1 tablet (325 mg total) by mouth 2 (two) times daily before a meal. 60 tablet 11  . lamivudine (EPIVIR) 300 MG tablet Take 1 tablet (300 mg total) by mouth daily. (Patient not taking: Reported on 04/16/2016) 30 tablet 5  . lidocaine (XYLOCAINE) 2 % solution Use as directed 20 mLs in the mouth or throat every 2 (two) hours as needed for mouth pain. Gargle and spit out 100 mL 0  . lisinopril (PRINIVIL,ZESTRIL) 10 MG tablet Take 1 tablet (10 mg total) by mouth daily. 30 tablet 0  . megestrol (MEGACE) 20 MG tablet Take 2 tablets (40 mg total) by mouth daily. 60 tablet 3  . Pitavastatin Calcium 4 MG TABS Take 1 tablet (4 mg total) by mouth daily. This is study provided and may be placebo. Do not fill prescription. 30 tablet 11  . rilpivirine (EDURANT) 25 MG TABS tablet Take 1 tablet (25 mg total) by mouth daily with breakfast. 30 tablet 5  . spironolactone (ALDACTONE) 100 MG tablet Take 1 tablet  (100 mg total) by mouth daily. Must have OV 30 tablet 0   No facility-administered medications prior to visit.    ROS Review of Systems  Constitutional: Negative for fever and chills.  Eyes: Negative for visual disturbance.  Respiratory: Negative for shortness of breath.   Cardiovascular: Negative for chest pain.  Gastrointestinal: Negative for abdominal pain and blood in stool.  Genitourinary: Positive for menstrual problem. Negative for vaginal bleeding (on megace ).  Musculoskeletal: Negative for back pain and arthralgias.  Skin: Negative for rash.  Allergic/Immunologic: Negative for immunocompromised state.  Hematological: Negative for adenopathy. Does not bruise/bleed easily.  Psychiatric/Behavioral: Negative for suicidal ideas and dysphoric mood.    Objective:  BP 124/80 mmHg  Pulse 85  Temp(Src) 98.8 F (37.1 C) (Oral)  Resp 16  Ht 5\' 5"  (1.651 m)  Wt 229 lb (103.874 kg)  BMI 38.11 kg/m2  SpO2 97%  LMP 04/09/2016  BP/Weight 04/25/2016 04/16/2016 [id number]  Systolic BP [id number]  Diastolic BP 80 71 88  Wt. (Lbs) 229 226 -  BMI 38.11 38.77 -   Physical Exam  Constitutional: She is oriented to person, place, and time. She appears well-developed and well-nourished. No distress.  HENT:  Head: Normocephalic and atraumatic.  Cardiovascular: Normal rate, regular rhythm, normal heart sounds and intact  distal pulses.   Pulmonary/Chest: Effort normal and breath sounds normal.  Musculoskeletal: She exhibits no edema.  Neurological: She is alert and oriented to person, place, and time.  Skin: Skin is warm and dry. No rash noted.     Psychiatric: She has a normal mood and affect.     Assessment & Plan:   There are no diagnoses linked to this encounter. [name] was seen today for hypertension.  Diagnoses and all orders for this visit:  Essential hypertension, benign -     spironolactone (ALDACTONE) 100 MG tablet; Take 1 tablet (100 mg total) by mouth daily. -      Discontinue: lisinopril (PRINIVIL,ZESTRIL) 10 MG tablet; Take 1 tablet (10 mg total) by mouth daily. -     amLODipine (NORVASC) 10 MG tablet; Take 1 tablet (10 mg total) by mouth daily. -     BASIC METABOLIC PANEL WITH GFR   Meds ordered this encounter  Medications  . spironolactone (ALDACTONE) 100 MG tablet    Sig: Take 1 tablet (100 mg total) by mouth daily.    Dispense:  90 tablet    Refill:  3  . DISCONTD: lisinopril (PRINIVIL,ZESTRIL) 10 MG tablet    Sig: Take 1 tablet (10 mg total) by mouth daily.    Dispense:  90 tablet    Refill:  3  . amLODipine (NORVASC) 10 MG tablet    Sig: Take 1 tablet (10 mg total) by mouth daily.    Dispense:  90 tablet    Refill:  3    Follow-up: No Follow-up on file.   Boykin Nearing MD

## 2016-04-25 NOTE — Patient Instructions (Addendum)
Shavell was seen today for hypertension.  Diagnoses and all orders for this visit:  Essential hypertension, benign -     spironolactone (ALDACTONE) 100 MG tablet; Take 1 tablet (100 mg total) by mouth daily. -     Discontinue: lisinopril (PRINIVIL,ZESTRIL) 10 MG tablet; Take 1 tablet (10 mg total) by mouth daily. -     amLODipine (NORVASC) 10 MG tablet; Take 1 tablet (10 mg total) by mouth daily. -     BASIC METABOLIC PANEL WITH GFR   F/u in 2 weeks for 30 minute skin tag removal and BP check  Dr. Adrian Blackwater

## 2016-04-25 NOTE — Progress Notes (Signed)
F/U HTN  Taking medication as prescribed  No pain today  No tobacco user  No suicidal thoughts in the past

## 2016-04-26 LAB — BASIC METABOLIC PANEL WITH GFR
BUN: 7 mg/dL (ref 7–25)
CHLORIDE: 106 mmol/L (ref 98–110)
CO2: 20 mmol/L (ref 20–31)
Calcium: 9.3 mg/dL (ref 8.6–10.4)
Creat: 0.84 mg/dL (ref 0.50–1.05)
GFR, Est African American: >89 mL/min (ref >=60)
GFR, Est Non African American: 78 mL/min (ref >=60)
GLUCOSE: 75 mg/dL (ref 65–99)
POTASSIUM: 4.5 mmol/L (ref 3.5–5.3)
Sodium: 139 mmol/L (ref 135–146)

## 2016-04-27 NOTE — Assessment & Plan Note (Signed)
Well controlled On 3 meds D/c low dose lisinopril Continue norvasc and aldactone Close f/u for repeat BP check and skin tag removal

## 2016-04-29 ENCOUNTER — Telehealth: Payer: Self-pay | Admitting: Obstetrics & Gynecology

## 2016-04-29 NOTE — Telephone Encounter (Signed)
TC to review pt [place] results. Left message.  [id number]

## 2016-05-05 ENCOUNTER — Encounter: Payer: Self-pay | Admitting: Family Medicine

## 2016-05-07 ENCOUNTER — Encounter: Payer: Self-pay | Admitting: Obstetrics & Gynecology

## 2016-05-12 ENCOUNTER — Encounter: Payer: Self-pay | Admitting: Family Medicine

## 2016-05-12 ENCOUNTER — Ambulatory Visit: Payer: Self-pay | Attending: Family Medicine | Admitting: Family Medicine

## 2016-05-12 VITALS — BP 140/88 | HR 80 | Temp 98.9°F | Resp 14 | Ht 65.0 in | Wt 228.8 lb

## 2016-05-12 DIAGNOSIS — L918 Other hypertrophic disorders of the skin: Secondary | ICD-10-CM | POA: Insufficient documentation

## 2016-05-12 DIAGNOSIS — I10 Essential (primary) hypertension: Secondary | ICD-10-CM | POA: Insufficient documentation

## 2016-05-12 DIAGNOSIS — Z79899 Other long term (current) drug therapy: Secondary | ICD-10-CM | POA: Insufficient documentation

## 2016-05-12 MED ORDER — LISINOPRIL 10 MG PO TABS: 10.0000 mg | ORAL_TABLET | Freq: Every day | ORAL | Status: DC

## 2016-05-12 NOTE — Progress Notes (Signed)
Pt here for BP check and skin tag removal. Pt denies any pain today. Pt has taken her morning medications today. Pt does not need any refills.

## 2016-05-12 NOTE — Progress Notes (Signed)
Subjective:  Patient ID: Wanda Kane, female    DOB: 29-Apr-1961  Age: 55 y.o. MRN: [id number]  CC: Follow-up and Hypertension   HPI Rafeef Weiand presents for   1. HTN: she has stopped lisinopril. No HA, CP, SOB or swelling.   2. Skin tag removal: has multiple small skin tags on neck. Painless but cosmetically annoying.   Social History  Substance Use Topics  . Smoking status: Never Smoker   . Smokeless tobacco: Never Used  . Alcohol Use: No    Outpatient Prescriptions Prior to Visit  Medication Sig Dispense Refill  . amLODipine (NORVASC) 10 MG tablet Take 1 tablet (10 mg total) by mouth daily. 90 tablet 3  . B-Complex-C TABS Take 1 tablet by mouth daily.    . dolutegravir (TIVICAY) 50 MG tablet Take 1 tablet (50 mg total) by mouth daily. 30 tablet 5  . ferrous sulfate 325 (65 FE) MG EC tablet Take 1 tablet (325 mg total) by mouth 2 (two) times daily before a meal. 60 tablet 11  . lamivudine (EPIVIR) 300 MG tablet Take 1 tablet (300 mg total) by mouth daily. 30 tablet 5  . megestrol (MEGACE) 20 MG tablet Take 2 tablets (40 mg total) by mouth daily. 60 tablet 3  . rilpivirine (EDURANT) 25 MG TABS tablet Take 1 tablet (25 mg total) by mouth daily with breakfast. 30 tablet 5  . spironolactone (ALDACTONE) 100 MG tablet Take 1 tablet (100 mg total) by mouth daily. 90 tablet 3  . lidocaine (XYLOCAINE) 2 % solution Use as directed 20 mLs in the mouth or throat every 2 (two) hours as needed for mouth pain. Gargle and spit out (Patient not taking: Reported on 04/25/2016) 100 mL 0  . Pitavastatin Calcium 4 MG TABS Take 1 tablet (4 mg total) by mouth daily. This is study provided and may be placebo. Do not fill prescription. (Patient not taking: Reported on 04/25/2016) 30 tablet 11   No facility-administered medications prior to visit.    ROS Review of Systems  Constitutional: Negative for fever and chills.  Eyes: Negative for visual disturbance.  Respiratory: Negative for shortness of  breath.   Cardiovascular: Negative for chest pain.  Gastrointestinal: Negative for abdominal pain and blood in stool.  Musculoskeletal: Negative for back pain and arthralgias.  Skin: Negative for rash.  Allergic/Immunologic: Negative for immunocompromised state.  Hematological: Negative for adenopathy. Does not bruise/bleed easily.  Psychiatric/Behavioral: Negative for suicidal ideas and dysphoric mood.    Objective:  BP 141/90 mmHg  Pulse 80  Temp(Src) 98.9 F (37.2 C) (Oral)  Resp 14  Ht 5\' 5"  (1.651 m)  Wt 228 lb 12.8 oz (103.783 kg)  BMI 38.07 kg/m2  SpO2 95%  BP/Weight 05/12/2016 04/25/2016 0000000  Systolic BP [id number]  Diastolic BP 90 80 71  Wt. (Lbs) 228.8 229 226  BMI 38.07 38.11 38.77   Physical Exam  Constitutional: She is oriented to person, place, and time. She appears well-developed and well-nourished. No distress.  HENT:  Head: Normocephalic and atraumatic.  Pulmonary/Chest: Effort normal.  Musculoskeletal: She exhibits no edema.  Neurological: She is alert and oriented to person, place, and time.  Skin: Skin is warm and dry. No rash noted.     Psychiatric: She has a normal mood and affect.   Lab Results  Component Value Date   HGBA1C 6.1* 06/01/2013    Skin Tag Removal Procedure Note  Pre-operative Diagnosis: Classic skin tags (acrochordon)  Post-operative Diagnosis: Classic skin tags (  acrochordon)  Locations:left, right neck   Indications: cosmetic   Anesthesia: 5% topical lidocaine    Procedure Details  The risks (including bleeding and infection) and benefits of the procedure and Written informed consent obtained. Using sterile iris scissors, multiple skin tags were snipped off at their bases after cleansing with Betadine.  Bleeding was controlled by pressure.   Findings: Pathognomonic benign lesions  not sent for pathological exam.  Condition: Stable  Complications: none.  Plan: 1. Instructed to keep the wounds dry and covered  for 24-48h and clean thereafter. 2. Warning signs of infection were reviewed.   3. Recommended that the patient use OTC acetaminophen as needed for pain.  4. Return as needed.  Assessment & Plan:   There are no diagnoses linked to this encounter. Kaileia was seen today for follow-up.  Diagnoses and all orders for this visit:  Essential hypertension -     lisinopril (ZESTRIL) 10 MG tablet; Take 1 tablet (10 mg total) by mouth daily.  Cutaneous skin tags   Meds ordered this encounter  Medications  . lisinopril (ZESTRIL) 10 MG tablet    Sig: Take 1 tablet (10 mg total) by mouth daily.    Dispense:  90 tablet    Refill:  3    Follow-up: No Follow-up on file.   Boykin Nearing MD

## 2016-05-12 NOTE — Assessment & Plan Note (Signed)
Multiple small skin tags removed from patient's neck

## 2016-05-12 NOTE — Patient Instructions (Signed)
Wanda Kane was seen today for follow-up.  Diagnoses and all orders for this visit:  Essential hypertension -     lisinopril (ZESTRIL) 10 MG tablet; Take 1 tablet (10 mg total) by mouth daily.  Cutaneous skin tags    I have added back lisinopril 10 mg daily for BP control   F/u in 4 weeks for BP check and skin tag f.u   Dr. Adrian Blackwater

## 2016-05-13 ENCOUNTER — Encounter: Payer: Self-pay | Admitting: Obstetrics & Gynecology

## 2016-05-16 ENCOUNTER — Encounter: Payer: Self-pay | Admitting: Infectious Disease

## 2016-06-05 ENCOUNTER — Encounter: Payer: Self-pay | Admitting: Infectious Disease

## 2016-06-23 ENCOUNTER — Ambulatory Visit: Payer: Self-pay | Admitting: Obstetrics & Gynecology

## 2016-07-02 ENCOUNTER — Other Ambulatory Visit: Payer: Self-pay | Admitting: Infectious Disease

## 2016-07-19 ENCOUNTER — Other Ambulatory Visit: Payer: Self-pay | Admitting: Infectious Disease

## 2016-07-19 DIAGNOSIS — B20 Human immunodeficiency virus [HIV] disease: Secondary | ICD-10-CM

## 2016-08-11 ENCOUNTER — Encounter (INDEPENDENT_AMBULATORY_CARE_PROVIDER_SITE_OTHER): Payer: Self-pay | Admitting: *Deleted

## 2016-08-11 VITALS — BP 114/79 | HR 66 | Temp 98.1°F | Wt 226.5 lb

## 2016-08-11 DIAGNOSIS — Z006 Encounter for examination for normal comparison and control in clinical research program: Secondary | ICD-10-CM

## 2016-08-11 DIAGNOSIS — Z23 Encounter for immunization: Secondary | ICD-10-CM

## 2016-08-11 NOTE — Progress Notes (Signed)
Wanda Kane is here for her month 8 visit for Reprieve, A Randomized Trial to Prevent Vascular Events in HIV (study drug is Pitavastatin 4mg  or placebo). She denies any new problems except for being stressed out about school. She denies any current muscle pain or weakness or any problems with adherence. She will return in January for the next study visit.

## 2016-08-12 LAB — POCT URINE PREGNANCY: Preg Test, Ur: NEGATIVE

## 2016-08-12 NOTE — Addendum Note (Signed)
Addended by: Bobbie Stack on: 08/12/2016 08:58 AM   Modules accepted: Orders

## 2016-08-14 ENCOUNTER — Other Ambulatory Visit: Payer: Self-pay | Admitting: Infectious Disease

## 2016-08-14 DIAGNOSIS — B20 Human immunodeficiency virus [HIV] disease: Secondary | ICD-10-CM

## 2016-08-14 MED ORDER — LAMIVUDINE 300 MG PO TABS: 300.0000 mg | ORAL_TABLET | Freq: Every day | ORAL | 5 refills | Status: DC

## 2016-09-01 ENCOUNTER — Ambulatory Visit: Payer: Self-pay

## 2016-09-18 ENCOUNTER — Encounter: Payer: Self-pay | Admitting: Family Medicine

## 2016-11-13 DIAGNOSIS — H524 Presbyopia: Secondary | ICD-10-CM | POA: Diagnosis not present

## 2016-11-19 ENCOUNTER — Other Ambulatory Visit: Payer: Self-pay

## 2016-11-21 ENCOUNTER — Other Ambulatory Visit: Payer: Self-pay

## 2016-11-21 DIAGNOSIS — B20 Human immunodeficiency virus [HIV] disease: Secondary | ICD-10-CM

## 2016-11-21 LAB — COMPLETE METABOLIC PANEL WITH GFR
ALT: 18 U/L (ref 6–29)
AST: 27 U/L (ref 10–35)
Albumin: 4.2 g/dL (ref 3.6–5.1)
Alkaline Phosphatase: 81 U/L (ref 33–130)
BILIRUBIN TOTAL: 0.9 mg/dL (ref 0.2–1.2)
BUN: 11 mg/dL (ref 7–25)
CALCIUM: 9.7 mg/dL (ref 8.6–10.4)
CHLORIDE: 105 mmol/L (ref 98–110)
CO2: 26 mmol/L (ref 20–31)
CREATININE: 0.85 mg/dL (ref 0.50–1.05)
GFR, Est African American: 89 mL/min (ref >=60)
GFR, Est Non African American: 77 mL/min (ref >=60)
Glucose, Bld: 96 mg/dL (ref 65–99)
Potassium: 4.1 mmol/L (ref 3.5–5.3)
Sodium: 139 mmol/L (ref 135–146)
TOTAL PROTEIN: 7.1 g/dL (ref 6.1–8.1)

## 2016-11-21 LAB — CBC WITH DIFFERENTIAL/PLATELET
BASOS PCT: 0 %
Basophils Absolute: 0 cells/uL (ref 0–200)
EOS ABS: 76 {cells}/uL (ref 15–500)
Eosinophils Relative: 1 %
HEMATOCRIT: 42.1 % (ref 35.0–45.0)
HEMOGLOBIN: 14.2 g/dL (ref 11.7–15.5)
LYMPHS ABS: 4256 {cells}/uL — AB (ref 850–3900)
LYMPHS PCT: 56 %
MCH: 30.6 pg (ref 27.0–33.0)
MCHC: 33.7 g/dL (ref 32.0–36.0)
MCV: 90.7 fL (ref 80.0–100.0)
MONO ABS: 304 {cells}/uL (ref 200–950)
MPV: 11.9 fL (ref 7.5–12.5)
Monocytes Relative: 4 %
NEUTROS ABS: 2964 {cells}/uL (ref 1500–7800)
Neutrophils Relative %: 39 %
Platelets: 275 10*3/uL (ref 140–400)
RBC: 4.64 MIL/uL (ref 3.80–5.10)
RDW: 13.6 % (ref 11.0–15.0)
WBC: 7.6 10*3/uL (ref 3.8–10.8)

## 2016-11-21 LAB — LIPID PANEL
CHOL/HDL RATIO: 3.9 ratio (ref <5.0)
Cholesterol: 228 mg/dL — ABNORMAL HIGH (ref <200)
HDL: 59 mg/dL (ref >50)
LDL Cholesterol: 131 mg/dL — ABNORMAL HIGH (ref <100)
Triglycerides: 189 mg/dL — ABNORMAL HIGH (ref <150)
VLDL: 38 mg/dL — ABNORMAL HIGH (ref <30)

## 2016-11-21 LAB — T-HELPER CELL (CD4) - (RCID CLINIC ONLY)
CD4 % Helper T Cell: 20 % — ABNORMAL LOW (ref 33–55)
CD4 T Cell Abs: 890 /uL (ref 400–2700)

## 2016-11-22 LAB — RPR

## 2016-11-24 LAB — URINE CYTOLOGY ANCILLARY ONLY
Chlamydia: NEGATIVE
Neisseria Gonorrhea: NEGATIVE

## 2016-11-25 LAB — HIV-1 RNA QUANT-NO REFLEX-BLD
HIV 1 RNA Quant: <20 copies/mL (ref <20)
HIV-1 RNA Quant, Log: <1.3 Log copies/mL (ref <1.30)

## 2016-12-03 ENCOUNTER — Ambulatory Visit: Payer: Self-pay | Admitting: Infectious Disease

## 2016-12-15 ENCOUNTER — Ambulatory Visit: Payer: Self-pay | Admitting: Infectious Disease

## 2016-12-17 ENCOUNTER — Ambulatory Visit: Payer: Self-pay | Admitting: Infectious Disease

## 2016-12-22 ENCOUNTER — Other Ambulatory Visit: Payer: Self-pay | Admitting: Infectious Disease

## 2016-12-23 ENCOUNTER — Encounter (INDEPENDENT_AMBULATORY_CARE_PROVIDER_SITE_OTHER): Payer: Self-pay | Admitting: *Deleted

## 2016-12-23 VITALS — BP 134/88 | HR 66 | Temp 98.3°F | Wt 228.2 lb

## 2016-12-23 DIAGNOSIS — Z006 Encounter for examination for normal comparison and control in clinical research program: Secondary | ICD-10-CM

## 2016-12-23 LAB — COMPREHENSIVE METABOLIC PANEL
ALK PHOS: 95 U/L (ref 33–130)
ALT: 18 U/L (ref 6–29)
AST: 25 U/L (ref 10–35)
Albumin: 4.3 g/dL (ref 3.6–5.1)
BUN: 11 mg/dL (ref 7–25)
CALCIUM: 9.5 mg/dL (ref 8.6–10.4)
CO2: 26 mmol/L (ref 20–31)
Chloride: 108 mmol/L (ref 98–110)
Creat: 0.72 mg/dL (ref 0.50–1.05)
Glucose, Bld: 99 mg/dL (ref 65–99)
POTASSIUM: 3.9 mmol/L (ref 3.5–5.3)
Sodium: 143 mmol/L (ref 135–146)
TOTAL PROTEIN: 7.3 g/dL (ref 6.1–8.1)
Total Bilirubin: 0.5 mg/dL (ref 0.2–1.2)

## 2016-12-23 NOTE — Progress Notes (Signed)
Wanda Kane is here for her month 12 visit for Reprieve, A Randomized Trial to Prevent Vascular Events in HIV (study drug is Pitavastatin 4mg  or placebo). She said she had the flu last week for about 4 days but is better now. She reports excellent adherence without any missed doses. She denies any muscle aches or weakness, but does have occassional cramping in her legs when she eats potato chips.  She will return in May for the next visit.

## 2016-12-24 LAB — PREGNANCY, URINE: Preg Test, Ur: NEGATIVE

## 2016-12-25 ENCOUNTER — Other Ambulatory Visit: Payer: Self-pay | Admitting: Infectious Disease

## 2016-12-25 DIAGNOSIS — B20 Human immunodeficiency virus [HIV] disease: Secondary | ICD-10-CM

## 2016-12-29 ENCOUNTER — Ambulatory Visit: Payer: Self-pay

## 2016-12-30 ENCOUNTER — Ambulatory Visit: Payer: Self-pay

## 2017-01-13 ENCOUNTER — Encounter: Payer: Self-pay | Admitting: Infectious Disease

## 2017-01-21 ENCOUNTER — Ambulatory Visit: Payer: 59

## 2017-01-21 DIAGNOSIS — F4320 Adjustment disorder, unspecified: Secondary | ICD-10-CM

## 2017-01-21 NOTE — BH Specialist Note (Signed)
I met with Wanda Kane for the first time in nearly a year. 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 She needed to process several issues with relationships and I gave support and feedback.  Plan to meet again in 3 weeks. [name], LCSW

## 2017-02-11 ENCOUNTER — Encounter: Payer: Self-pay | Admitting: Infectious Disease

## 2017-02-11 ENCOUNTER — Ambulatory Visit (INDEPENDENT_AMBULATORY_CARE_PROVIDER_SITE_OTHER): Payer: 59 | Admitting: Infectious Disease

## 2017-02-11 VITALS — BP 138/82 | HR 82 | Temp 98.4°F | Wt 225.0 lb

## 2017-02-11 DIAGNOSIS — B20 Human immunodeficiency virus [HIV] disease: Secondary | ICD-10-CM

## 2017-02-11 DIAGNOSIS — D6959 Other secondary thrombocytopenia: Secondary | ICD-10-CM | POA: Diagnosis not present

## 2017-02-11 DIAGNOSIS — M791 Myalgia, unspecified site: Secondary | ICD-10-CM

## 2017-02-11 DIAGNOSIS — I10 Essential (primary) hypertension: Secondary | ICD-10-CM

## 2017-02-11 DIAGNOSIS — I82401 Acute embolism and thrombosis of unspecified deep veins of right lower extremity: Secondary | ICD-10-CM

## 2017-02-11 NOTE — Progress Notes (Signed)
Chief complaint: no complaints Subjective:    Patient ID: Wanda Kane, female    DOB: [birth year], 56 y.o.   MRN: [id number]  HPI  Ms Tomassetti is a 56 year-old [place] American lady with HIV previous he cared for [facility] and then moved to [place]. She had been on various regimens in 1990s and then on drug holiday. Later while in [place] she apparently was on various antiretroviral regimens lastly on Isentress and complera.   She moved back was seen by Dr. Baxter Flattery in February. Her regimen was simplified to STRIBILD. She had tolerated this regimen without to much complaint other some nausea and loose stools.   However week prior to her seeing me in March 2014 she had  developed increasingly severe diffuse myalgias in her neck arms legs. She had similar symptoms in the past while out on antiretrovirals. She could not initially recall which antiretrovirals at apparently been blamed for the symptoms. However when I examined her allergy history he or she is listed as being allergic to tenofovir with alleged   tenofovir-induced rhabdomyolysis.  I  therefore crafted a new ARV regimen for her without TNF, namely once daily Tivicay, Edurant and Epivir. I  checked her for elevated CPK  Which was at 237. Since change to new TNF free regimen she had down titration of her CK to normal but then  back above 200 again.   She had initially not been seen by Rheum due to lack of insurance.  I offered further Rheum workup with labs was unrevealing  We discussed option of going to 2 drug regimen of JULUCA (DTG/RPV) and she is interested in it but would like to wait for summer time before making a switch  Lab Results  Component Value Date   HIV1RNAQUANT <20 11/21/2016   Lab Results  Component Value Date   CD4TABS 890 11/21/2016   CD4TABS 600 11/08/2015   CD4TABS 670 09/29/2014    Past Medical History:  Diagnosis Date  . Deep vein thrombophlebitis of right leg ([place])    2016  . DVT (deep venous  thrombosis) ([place]) Jan 2016  . DVT (deep venous thrombosis) ([place])   . Fibroid   . Fibroids 2010  . HIV disease ([place]) 11/22/2015  . HIV infection ([place]) Dx 1994  . Hypertension 1994  . Vaginal bleeding 12/2014   due to xarelto    Past Surgical History:  Procedure Laterality Date  . CESAREAN SECTION  1983  . CESAREAN SECTION    . TONSILLECTOMY AND ADENOIDECTOMY      Family History  Problem Relation Age of Onset  . Hypertension Mother   . CAD Brother   . Heart disease Brother 109    cardiac arrest      Social History   Social History  . Marital status: Single    Spouse name: N/A  . Number of children: N/A  . Years of education: N/A   Social History Main Topics  . Smoking status: Never Smoker  . Smokeless tobacco: Never Used  . Alcohol use No  . Drug use: No  . Sexual activity: Not Currently    Partners: Male     Comment: patient declined condoms   Other Topics Concern  . None   Social History Narrative  . None    Allergies  Allergen Reactions  . Viread [Tenofovir Disoproxil] Other (See Comments)    Rhabdomyolysis     Current Outpatient Prescriptions:  .  amLODipine (NORVASC) 10 MG tablet, Take 1  tablet (10 mg total) by mouth daily., Disp: 90 tablet, Rfl: 3 .  B-Complex-C TABS, Take 1 tablet by mouth daily., Disp: , Rfl:  .  EDURANT 25 MG TABS tablet, TAKE 1 TABLET BY MOUTH DAILY WITH BREAKFAST, Disp: 30 tablet, Rfl: 0 .  ferrous sulfate 325 (65 FE) MG tablet, TAKE 1 TABLET BY MOUTH TWICE DAILY BEFORE A MEAL, Disp: 60 tablet, Rfl: 5 .  lamivudine (EPIVIR) 300 MG tablet, Take 1 tablet (300 mg total) by mouth daily., Disp: 30 tablet, Rfl: 5 .  lidocaine (XYLOCAINE) 2 % solution, Use as directed 20 mLs in the mouth or throat every 2 (two) hours as needed for mouth pain. Gargle and spit out (Patient not taking: Reported on 04/25/2016), Disp: 100 mL, Rfl: 0 .  lisinopril (ZESTRIL) 10 MG tablet, Take 1 tablet (10 mg total) by mouth daily., Disp: 90 tablet, Rfl: 3 .   megestrol (MEGACE) 20 MG tablet, Take 2 tablets (40 mg total) by mouth daily., Disp: 60 tablet, Rfl: 3 .  Pitavastatin Calcium 4 MG TABS, Take 1 tablet (4 mg total) by mouth daily. This is study provided and may be placebo. Do not fill prescription. (Patient not taking: Reported on 04/25/2016), Disp: 30 tablet, Rfl: 11 .  rilpivirine (EDURANT) 25 MG TABS tablet, Take 1 tablet (25 mg total) by mouth daily with breakfast., Disp: 30 tablet, Rfl: 5 .  spironolactone (ALDACTONE) 100 MG tablet, , Disp: , Rfl: 1 .  TIVICAY 50 MG tablet, TAKE 1 TABLET BY MOUTH DAILY, Disp: 30 tablet, Rfl: 5    Review of Systems  Constitutional: Negative for activity change, appetite change, chills, diaphoresis, fatigue, fever and unexpected weight change.  HENT: Negative for congestion, rhinorrhea, sinus pressure, sneezing, sore throat and trouble swallowing.   Eyes: Negative for photophobia and visual disturbance.  Respiratory: Negative for cough, chest tightness, shortness of breath, wheezing and stridor.   Cardiovascular: Negative for chest pain, palpitations and leg swelling.  Gastrointestinal: Negative for abdominal distention, abdominal pain, anal bleeding, blood in stool, constipation, diarrhea, nausea and vomiting.  Genitourinary: Negative for difficulty urinating, dysuria, flank pain and hematuria.  Musculoskeletal: Negative for arthralgias, back pain, gait problem, joint swelling, myalgias and neck stiffness.  Skin: Negative for color change, pallor, rash and wound.  Neurological: Negative for dizziness, tremors, weakness and light-headedness.  Hematological: Negative for adenopathy. Does not bruise/bleed easily.  Psychiatric/Behavioral: Negative for agitation, behavioral problems, confusion, decreased concentration, dysphoric mood and sleep disturbance.       Objective:   Physical Exam  Constitutional: She is oriented to person, place, and time. She appears well-developed and well-nourished. No distress.   HENT:  Head: Normocephalic and atraumatic.  Mouth/Throat: Oropharynx is clear and moist. No oropharyngeal exudate.  Eyes: Conjunctivae and EOM are normal. No scleral icterus.  Neck: Normal range of motion. Neck supple. No JVD present.  Cardiovascular: Normal rate, regular rhythm and normal heart sounds.   Pulmonary/Chest: Effort normal and breath sounds normal. No respiratory distress.  Abdominal: She exhibits no distension and no mass. There is no tenderness. There is no rebound and no guarding.  Musculoskeletal: She exhibits no edema or tenderness.  Lymphadenopathy:    She has no cervical adenopathy.  Neurological: She is alert and oriented to person, place, and time. She exhibits normal muscle tone. Coordination normal.  Skin: Skin is warm and dry. She is not diaphoretic. No erythema. No pallor.  Psychiatric: She has a normal mood and affect. Her behavior is normal. Judgment and thought  content normal.          Assessment & Plan:    HIV: continue current regimen.make switch to JULUCA in summer. She is getting meds from [facility] now that she is [employer] employee  Myositis: not clear wha had been  causing this but she is symptomatically better TNF, probably NEVER had anything to do with   DVT: with a filter now and not on anticoagulation  HTN: bp initially in 200s, was 130 on recheck. Followup with Dr. Adrian Blackwater   I spent greater than 25 minutes with the patient including greater than 50% of time in face to face counsel of the patient re current and past ARV regimens, and possible new regimen, HTN, myositis, and in coordination of their care.  There were no vitals filed for this visit.

## 2017-02-12 ENCOUNTER — Ambulatory Visit: Payer: 59

## 2017-02-12 DIAGNOSIS — F4320 Adjustment disorder, unspecified: Secondary | ICD-10-CM

## 2017-02-12 NOTE — BH Specialist Note (Signed)
Wanda Kane was in a pleasant mood today, reporting she has been feeling much better now that she is working full time. 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 We discussed a relationship that ended a while ago but still bothers her some. I suggested she write a letter, but not engage the ex by giving it to him. She agreed that this was a good idea. Plan to meet again in 3 months. [name], LCSW

## 2017-03-05 IMAGING — US US PELVIS COMPLETE
1 series · 15 of 25 positions shown · non-contrast
Comparison: Pelvic ultrasound December 22, 2013

CLINICAL DATA: Abnormal uterine bleeding ; patient is
perimenopausal, history of fibroids and previous C-section, HIV.
Onset of the patient's last normal menstrual period was April 10, 2016.

EXAM:
TRANSABDOMINAL AND TRANSVAGINAL ULTRASOUND OF PELVIS
TECHNIQUE: Both transabdominal and transvaginal ultrasound examinations of the
pelvis were performed. Transabdominal technique was performed for
global imaging of the pelvis including uterus, ovaries, adnexal
regions, and pelvic cul-de-sac. It was necessary to proceed with
endovaginal exam following the transabdominal exam to visualize the
uterus, endometrium, ovaries, and adnexal regions..

[Series 1: us pelvis complete · 15 of 62 slices shown]
[im 1/62]
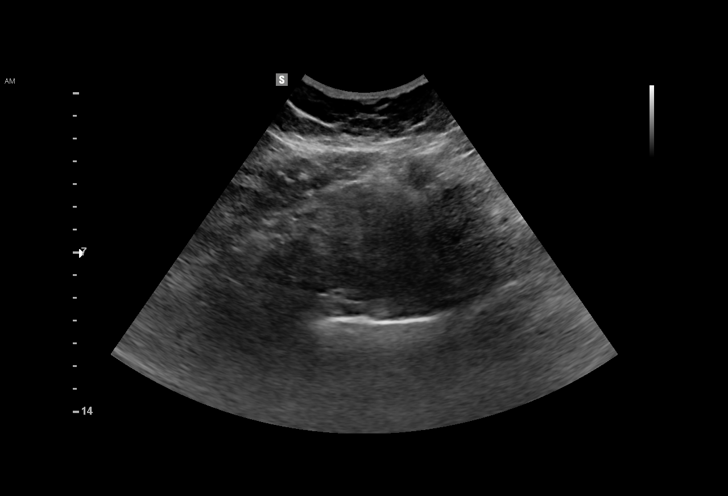
[im 6/62]
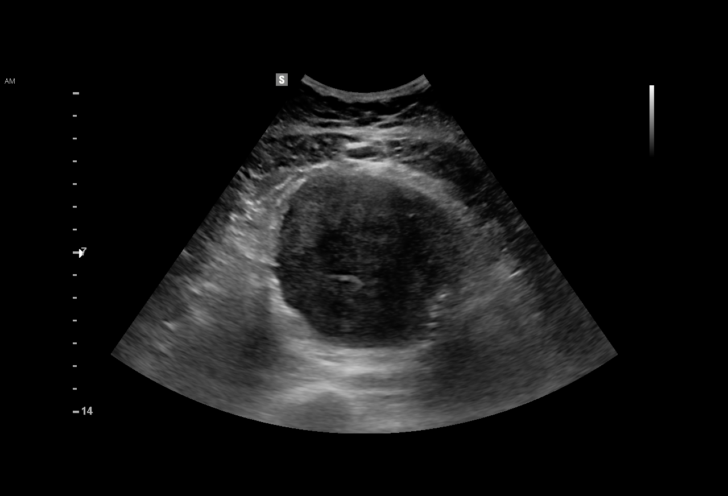
[im 11/62]
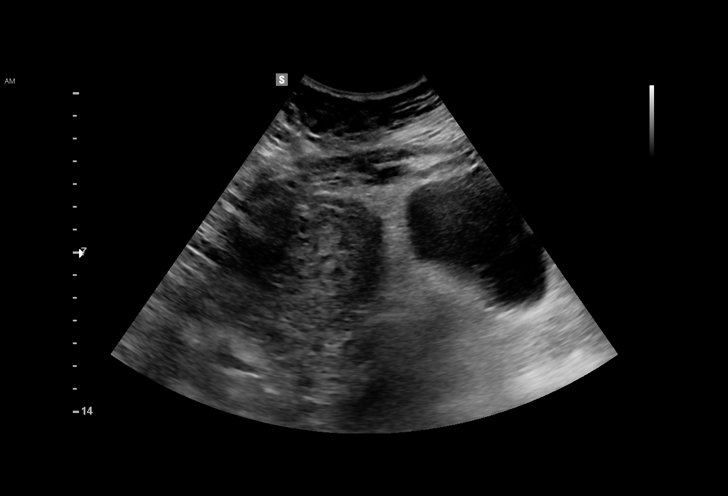
[im 13/62]
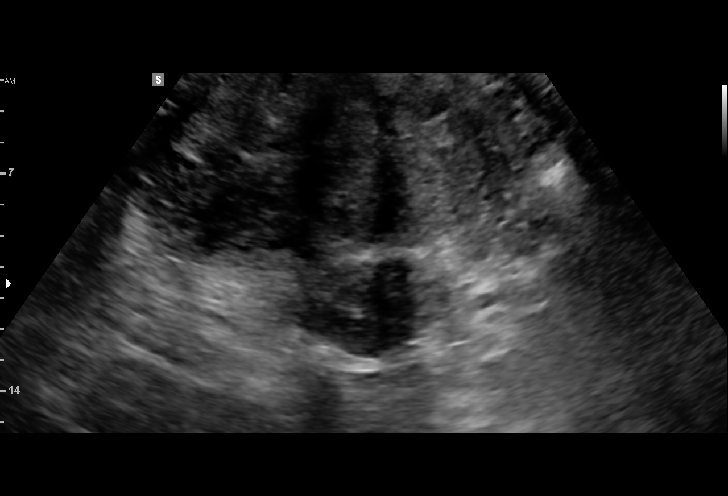
[im 18/62]
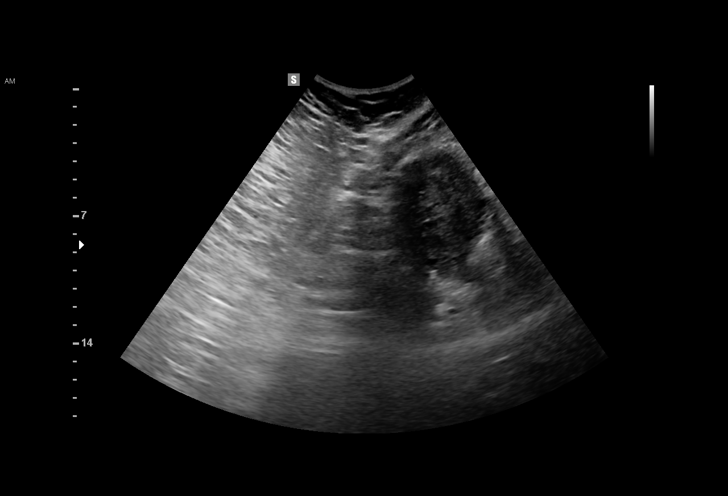
[im 23/62]
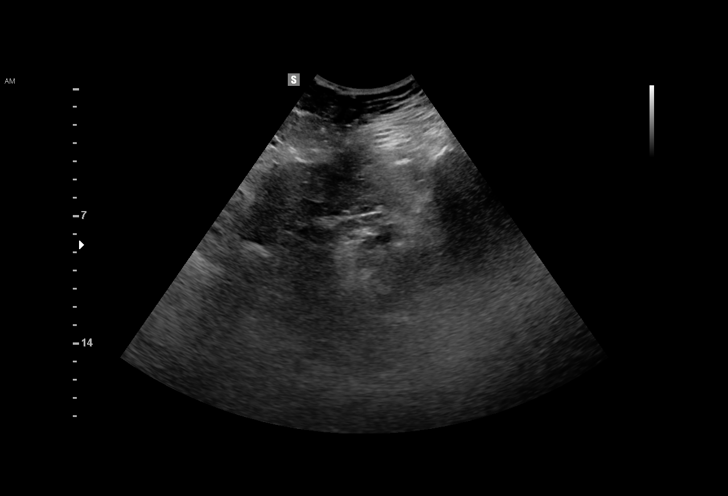
[im 26/62]
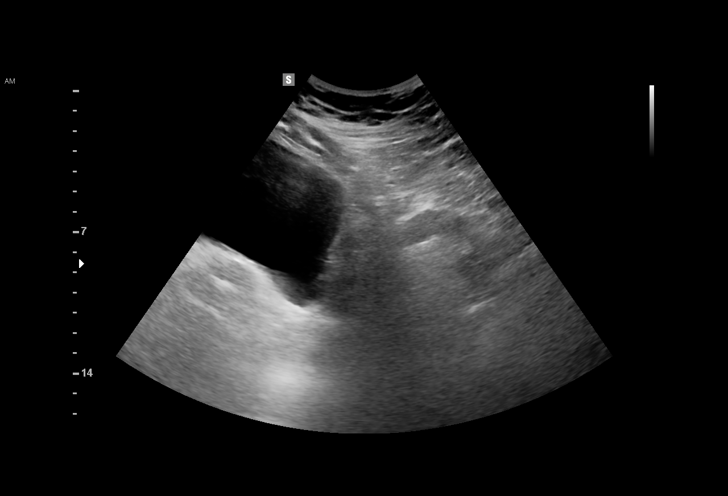
[im 31/62]
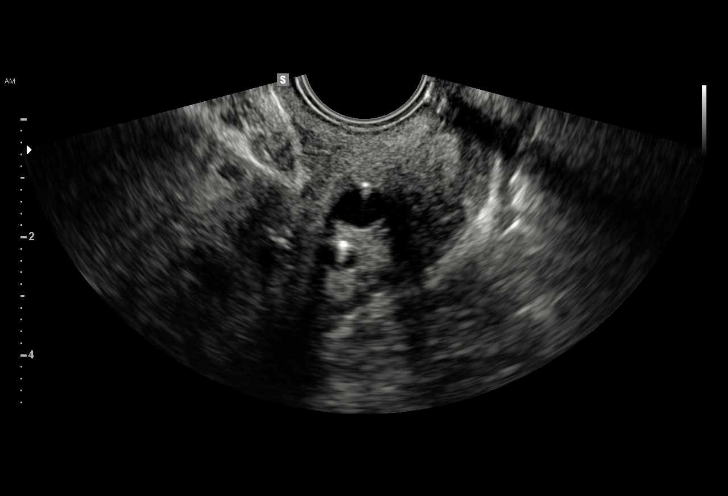
[im 36/62]
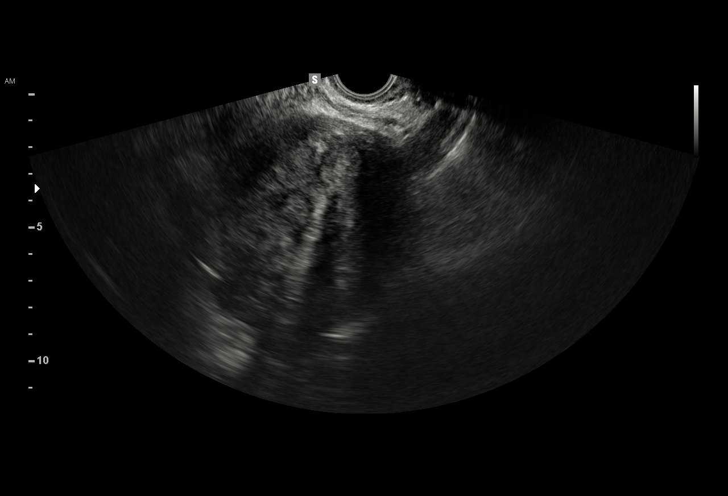
[im 39/62]
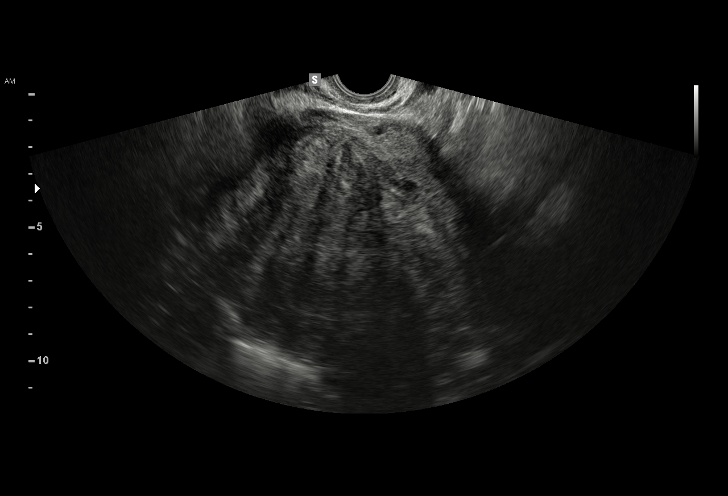
[im 44/62]
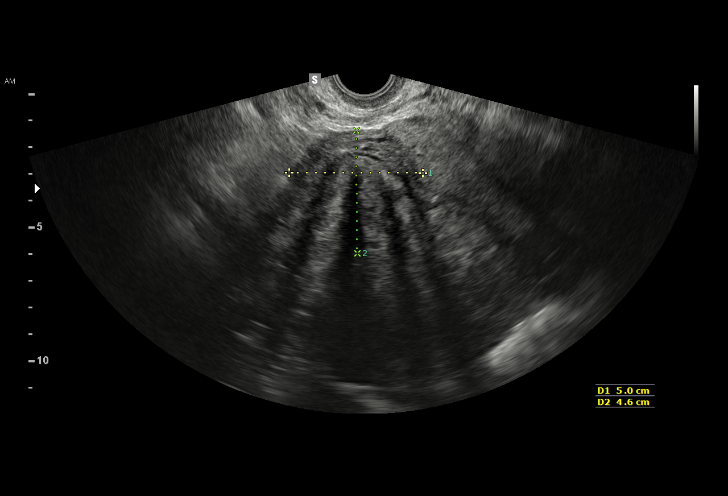
[im 49/62]
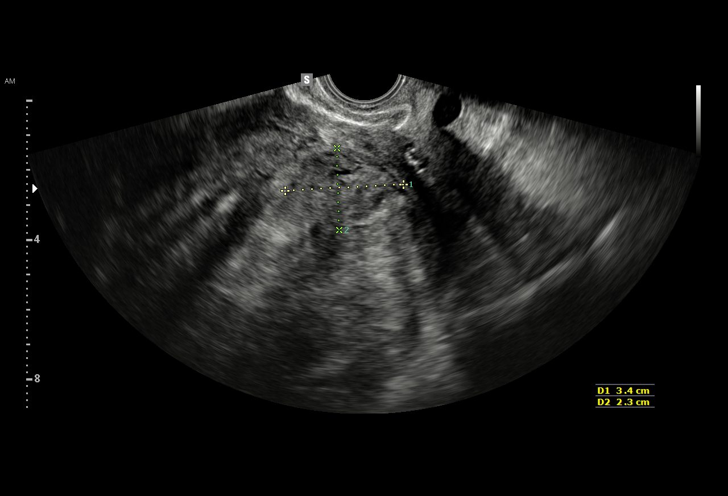
[im 51/62]
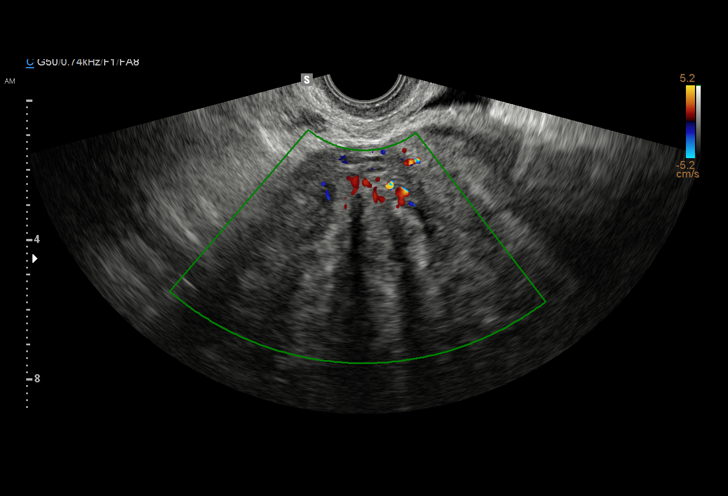
[im 56/62]
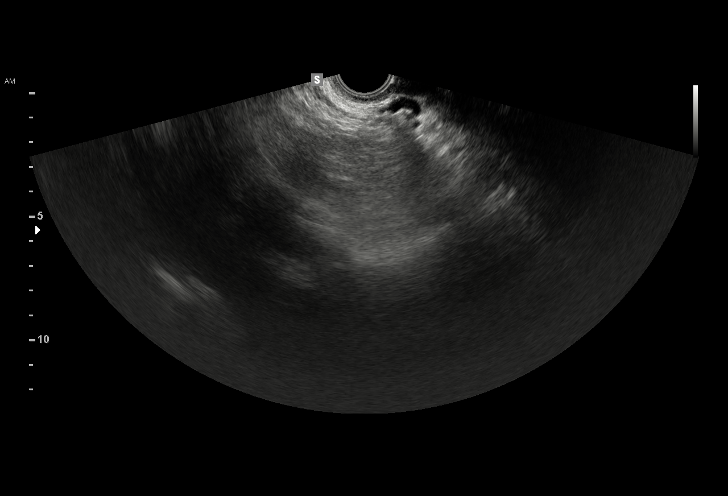
[im 62/62]
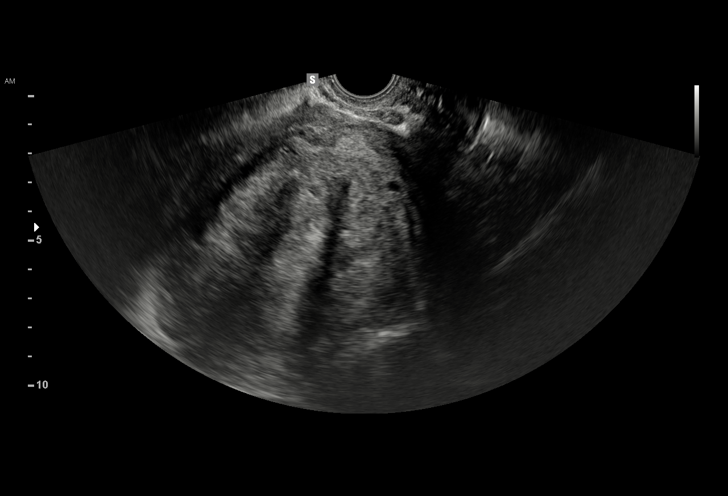

[15 of 25 positions shown; findings below may reference images not displayed]

FINDINGS: The study is limited due to the patient's body habitus and a large
uterine fibroids.

Uterus

Measurements: 13.6 x 8.0 x 11.4 cm. The uterus has increased in size
since the previous study. There is a dominant fibroid to the left of
midline in the fundus that measures 5 x 4.6 x 5.5 cm. This has
increased in size. There is a second more inferiorly positioned
fibroid in the lower uterine segment anteriorly which measures 3.4 x
2.3 by 3.2 cm. This has also increased in size.

Endometrium

Thickness: Cannot be accurately measured due to fibroids. No
endometrial fluid collections are observed.

Right ovary

Not visualized.

Left ovary

Not visualized.

Other findings

No abnormal free fluid.
IMPRESSION: 1. Interval increase in the size of the uterus. The 2 uterine
fibroids have also mildly increased in size. The endometrial stripe
could not be well demonstrated. If bleeding remains unresponsive to
hormonal or medical therapy, sonohysterogram should be considered
for focal lesion work-up. (Ref: Radiological Reasoning: Algorithmic
Workup of Abnormal Vaginal Bleeding with Endovaginal Sonography and
Sonohysterography. AJR 1660; 191:S68-73)
2. Nonvisualization of the ovaries. No adnexal masses or free fluid
was observed.

## 2017-04-01 ENCOUNTER — Encounter (INDEPENDENT_AMBULATORY_CARE_PROVIDER_SITE_OTHER): Payer: 59 | Admitting: *Deleted

## 2017-04-01 ENCOUNTER — Other Ambulatory Visit: Payer: Self-pay | Admitting: *Deleted

## 2017-04-01 ENCOUNTER — Encounter: Payer: Self-pay | Admitting: Family Medicine

## 2017-04-01 VITALS — BP 153/95 | HR 72 | Temp 98.2°F | Wt 229.5 lb

## 2017-04-01 DIAGNOSIS — B20 Human immunodeficiency virus [HIV] disease: Secondary | ICD-10-CM

## 2017-04-01 DIAGNOSIS — Z006 Encounter for examination for normal comparison and control in clinical research program: Secondary | ICD-10-CM

## 2017-04-01 MED ORDER — RILPIVIRINE HCL 25 MG PO TABS: 25.0000 mg | ORAL_TABLET | Freq: Every day | ORAL | 5 refills | Status: DC

## 2017-04-01 MED ORDER — DOLUTEGRAVIR SODIUM 50 MG PO TABS: 50.0000 mg | ORAL_TABLET | Freq: Every day | ORAL | 5 refills | Status: DC

## 2017-04-01 MED FILL — lamiVUDine 300 MG TABS: 300 | 30 days supply | Qty: 30 | Fill #0

## 2017-04-01 MED FILL — spIRONOLACTONE 100 MG TAB: 100 | 60 days supply | Qty: 60 | Fill #0

## 2017-04-01 MED FILL — TIVICAY 50 MG TABLET: 50 | 30 days supply | Qty: 30 | Fill #0

## 2017-04-01 MED FILL — AMLODIPINE BESYLATE 10 MG T: 10 | 60 days supply | Qty: 60 | Fill #0

## 2017-04-01 MED FILL — EDURANT 25 MG TABS: 25 | 30 days supply | Qty: 30 | Fill #0

## 2017-04-01 MED FILL — LISINOPRIL 10 MG TABLET: 10 | 90 days supply | Qty: 90 | Fill #0

## 2017-04-01 NOTE — Progress Notes (Signed)
Wanda Kane is here for her month 16 visit for Reprieve, A Randomized Trial to Prevent Vascular Events in HIV (study drug is Pitavastatin 4mg  or placebo)  She denies any new problems or medications. Her adherence with study drug is excellent. Her BP was up at this visit and she is concerned about it. Her weight hasn't really changed much, nor her diet and she had taken her BP meds this morning. She didn't seem to be holding any fluid. She is now motivated to work out more to lose a few pounds and see if that helps the BP. She will be returning in September for the next visit.

## 2017-04-02 LAB — PREGNANCY, URINE: PREG TEST UR: NEGATIVE

## 2017-04-07 ENCOUNTER — Other Ambulatory Visit: Payer: Self-pay | Admitting: Infectious Disease

## 2017-04-07 DIAGNOSIS — B20 Human immunodeficiency virus [HIV] disease: Secondary | ICD-10-CM

## 2017-04-28 MED FILL — lamiVUDine 300 MG TABS: 300 | 30 days supply | Qty: 30 | Fill #0

## 2017-04-29 ENCOUNTER — Other Ambulatory Visit: Payer: Self-pay | Admitting: Pharmacist

## 2017-04-29 ENCOUNTER — Telehealth: Payer: Self-pay | Admitting: Pharmacist

## 2017-04-29 DIAGNOSIS — B20 Human immunodeficiency virus [HIV] disease: Secondary | ICD-10-CM

## 2017-04-29 MED ORDER — DOLUTEGRAVIR-RILPIVIRINE 50-25 MG PO TABS: 1.0000 | ORAL_TABLET | Freq: Every day | ORAL | 5 refills | Status: DC

## 2017-04-29 MED FILL — JULUCA 50-25 MG TAB: 50-25 | 30 days supply | Qty: 30 | Fill #0

## 2017-04-29 NOTE — Telephone Encounter (Signed)
Spoke with Wanda Kane today about her HIV medications.  She is now interested in changing over from [place] + rilpivirine to [place].  She said Dr. Tommy Medal spoke to her about it last time she saw him.  Will make that change now and she will be on Juluca once daily + Lamivudine 300 mg once daily.

## 2017-04-29 NOTE — Telephone Encounter (Signed)
OK that is perfect thanks Cassie!

## 2017-05-07 ENCOUNTER — Ambulatory Visit: Payer: 59

## 2017-05-22 MED FILL — lamiVUDine 300 MG TABS: 300 | 30 days supply | Qty: 30 | Fill #1

## 2017-05-22 MED FILL — JULUCA 50-25 MG TAB: 50-25 | 30 days supply | Qty: 30 | Fill #1

## 2017-06-09 ENCOUNTER — Other Ambulatory Visit: Payer: Self-pay | Admitting: Family Medicine

## 2017-06-09 DIAGNOSIS — I10 Essential (primary) hypertension: Secondary | ICD-10-CM

## 2017-06-10 ENCOUNTER — Other Ambulatory Visit: Payer: Self-pay | Admitting: Family Medicine

## 2017-06-10 DIAGNOSIS — I10 Essential (primary) hypertension: Secondary | ICD-10-CM

## 2017-06-11 ENCOUNTER — Telehealth: Payer: Self-pay | Admitting: Family Medicine

## 2017-06-11 DIAGNOSIS — I10 Essential (primary) hypertension: Secondary | ICD-10-CM

## 2017-06-11 MED ORDER — AMLODIPINE BESYLATE 10 MG PO TABS: 10.0000 mg | ORAL_TABLET | Freq: Every day | ORAL | 0 refills | Status: DC

## 2017-06-11 MED ORDER — LISINOPRIL 10 MG PO TABS: 10.0000 mg | ORAL_TABLET | Freq: Every day | ORAL | 0 refills | Status: DC

## 2017-06-11 MED ORDER — SPIRONOLACTONE 100 MG PO TABS: 100.0000 mg | ORAL_TABLET | Freq: Every day | ORAL | 0 refills | Status: DC

## 2017-06-11 MED FILL — spIRONOLACTONE 100 MG TAB: 100 | 30 days supply | Qty: 30 | Fill #0

## 2017-06-11 MED FILL — AMLODIPINE BESYLATE 10 MG T: 10 | 30 days supply | Qty: 30 | Fill #0

## 2017-06-11 NOTE — Telephone Encounter (Signed)
Pt. Called requesting a refill on her current medications. Pt. Has scheduled an appt. On 06/18/17.  Pt. Would like Rx sent to out patient pharmacy in [place]. Please f/u with pt.

## 2017-06-11 NOTE — Telephone Encounter (Signed)
Patient has not been seen in over a year, will forward request to Dr. Adrian Blackwater

## 2017-06-11 NOTE — Telephone Encounter (Signed)
Called to patient Verified name and DOB  she reports taking all BP medications Has 3 days left  Sent in 30 day supply Will see her in f/u for additional refills

## 2017-06-18 ENCOUNTER — Other Ambulatory Visit (HOSPITAL_COMMUNITY)
Admission: RE | Admit: 2017-06-18 | Discharge: 2017-06-18 | Disposition: A | Discharge status: Home / Self Care | Payer: 59 | Source: Ambulatory Visit | Attending: Family Medicine | Admitting: Family Medicine

## 2017-06-18 ENCOUNTER — Ambulatory Visit: Payer: 59 | Attending: Family Medicine | Admitting: Family Medicine

## 2017-06-18 ENCOUNTER — Encounter: Payer: Self-pay | Admitting: Family Medicine

## 2017-06-18 VITALS — BP 134/84 | HR 96 | Temp 98.3°F | Ht 65.0 in | Wt 229.0 lb

## 2017-06-18 DIAGNOSIS — R195 Other fecal abnormalities: Secondary | ICD-10-CM | POA: Diagnosis not present

## 2017-06-18 DIAGNOSIS — R87810 Cervical high risk human papillomavirus (HPV) DNA test positive: Secondary | ICD-10-CM | POA: Diagnosis not present

## 2017-06-18 DIAGNOSIS — I10 Essential (primary) hypertension: Secondary | ICD-10-CM | POA: Diagnosis not present

## 2017-06-18 DIAGNOSIS — E785 Hyperlipidemia, unspecified: Secondary | ICD-10-CM

## 2017-06-18 DIAGNOSIS — Z780 Asymptomatic menopausal state: Secondary | ICD-10-CM | POA: Insufficient documentation

## 2017-06-18 DIAGNOSIS — N920 Excessive and frequent menstruation with regular cycle: Secondary | ICD-10-CM | POA: Diagnosis not present

## 2017-06-18 DIAGNOSIS — Z21 Asymptomatic human immunodeficiency virus [HIV] infection status: Secondary | ICD-10-CM | POA: Diagnosis not present

## 2017-06-18 DIAGNOSIS — Z124 Encounter for screening for malignant neoplasm of cervix: Secondary | ICD-10-CM | POA: Diagnosis not present

## 2017-06-18 DIAGNOSIS — Z0000 Encounter for general adult medical examination without abnormal findings: Secondary | ICD-10-CM | POA: Diagnosis not present

## 2017-06-18 DIAGNOSIS — Z79899 Other long term (current) drug therapy: Secondary | ICD-10-CM | POA: Insufficient documentation

## 2017-06-18 DIAGNOSIS — Z1231 Encounter for screening mammogram for malignant neoplasm of breast: Secondary | ICD-10-CM | POA: Diagnosis not present

## 2017-06-18 DIAGNOSIS — D259 Leiomyoma of uterus, unspecified: Secondary | ICD-10-CM | POA: Insufficient documentation

## 2017-06-18 DIAGNOSIS — Z01419 Encounter for gynecological examination (general) (routine) without abnormal findings: Secondary | ICD-10-CM | POA: Diagnosis not present

## 2017-06-18 MED ORDER — LISINOPRIL 10 MG PO TABS: 10.0000 mg | ORAL_TABLET | Freq: Every day | ORAL | 3 refills | Status: DC

## 2017-06-18 MED ORDER — SPIRONOLACTONE 100 MG PO TABS: 100.0000 mg | ORAL_TABLET | Freq: Every day | ORAL | 3 refills | Status: DC

## 2017-06-18 MED ORDER — AMLODIPINE BESYLATE 10 MG PO TABS: 10.0000 mg | ORAL_TABLET | Freq: Every day | ORAL | 3 refills | Status: DC

## 2017-06-18 NOTE — Progress Notes (Signed)
Subjective:  Patient ID: Wanda Kane, female    DOB: 09-Apr-1961  Age: 56 y.o. MRN: [id number]  CC: Hypertension   HPI Wanda Kane has HIV, HTN, history of menorrhagia with uterine fibroids  she  presents for   1. CHRONIC HYPERTENSION  Disease Monitoring  Blood pressure range: not checking   Chest pain: no   Dyspnea: no   Claudication: no   Medication compliance: yes  Medication Side Effects  Lightheadedness: no   Urinary frequency: no   Edema: no    2. Pap smear: she is amenable to screening pap smear today. She is sexually active. 1 partner who is also HIV positive. She is post menopausal. Her LMP was 1 year and 1 month ago.   3. Mammogram: amenable  4. Colon cancer screening: amenable. Does not have a friend of family member who can accompany her.   Social History  Substance Use Topics  . Smoking status: Never Smoker  . Smokeless tobacco: Never Used  . Alcohol use No    Outpatient Medications Prior to Visit  Medication Sig Dispense Refill  . amLODipine (NORVASC) 10 MG tablet Take 1 tablet (10 mg total) by mouth daily. 30 tablet 0  . B-Complex-C TABS Take 1 tablet by mouth daily.    . Dolutegravir-Rilpivirine (JULUCA) 50-25 MG TABS Take 1 tablet by mouth daily. 30 tablet 5  . ferrous sulfate 325 (65 FE) MG tablet TAKE 1 TABLET BY MOUTH TWICE DAILY BEFORE A MEAL 60 tablet 5  . lamivudine (EPIVIR) 300 MG tablet TAKE 1 TABLET BY MOUTH ONCE DAILY 30 tablet 3  . lidocaine (XYLOCAINE) 2 % solution Use as directed 20 mLs in the mouth or throat every 2 (two) hours as needed for mouth pain. Gargle and spit out (Patient not taking: Reported on 04/25/2016) 100 mL 0  . lisinopril (ZESTRIL) 10 MG tablet Take 1 tablet (10 mg total) by mouth daily. 30 tablet 0  . megestrol (MEGACE) 20 MG tablet Take 2 tablets (40 mg total) by mouth daily. 60 tablet 3  . Pitavastatin Calcium 4 MG TABS Take 1 tablet (4 mg total) by mouth daily. This is study provided and may be placebo. Do not fill  prescription. 30 tablet 11  . spironolactone (ALDACTONE) 100 MG tablet Take 1 tablet (100 mg total) by mouth daily. 30 tablet 0   No facility-administered medications prior to visit.     ROS Review of Systems  Constitutional: Negative for chills and fever.  Eyes: Negative for visual disturbance.  Respiratory: Negative for shortness of breath.   Cardiovascular: Negative for chest pain.  Gastrointestinal: Negative for abdominal pain and blood in stool.  Musculoskeletal: Positive for arthralgias and myalgias. Negative for back pain.       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  Allergic/Immunologic: Negative for immunocompromised state.  Hematological: Negative for adenopathy. Does not bruise/bleed easily.  Psychiatric/Behavioral: Negative for dysphoric mood and suicidal ideas.    Objective:  BP 134/84   Pulse 96   Temp 98.3 F (36.8 C) (Oral)   Ht _0  (1.651 m)   Wt 229 lb (103.9 kg)   SpO2 95%   BMI 38.11 kg/m   BP/Weight 06/18/2017 04/01/2017 12/29/9415  Systolic BP 408 144 818  Diastolic BP 84 95 82  Wt. (Lbs) 229 229.5 225  BMI 38.11 38.19 37.44   Physical Exam  Constitutional: She is oriented to person, place, and time. She appears well-developed  and well-nourished. No distress.  HENT:  Head: Normocephalic and atraumatic.  Cardiovascular: Normal rate, regular rhythm, normal heart sounds and intact distal pulses.   Pulmonary/Chest: Effort normal and breath sounds normal.  Genitourinary: Vagina normal. Pelvic exam was performed with patient supine. There is no rash, tenderness or lesion on the right labia. There is no rash, tenderness or lesion on the left labia. Uterus is enlarged. Uterus is not deviated, not fixed and not tender. Cervix exhibits no motion tenderness, no discharge and no friability.  Musculoskeletal: She exhibits no edema.  Lymphadenopathy:       Right: No inguinal adenopathy present.       Left: No inguinal  adenopathy present.  Neurological: She is alert and oriented to person, place, and time.  Skin: Skin is warm and dry. No rash noted.  Psychiatric: She has a normal mood and affect.   Lab Results  Component Value Date   HGBA1C 6.1 (H) 06/01/2013     Assessment & Plan:   There are no diagnoses linked to this encounter. Wanda Kane was seen today for hypertension.  Diagnoses and all orders for this visit:  Essential hypertension -     CMP14+EGFR -     Lipid Panel -     Magnesium  Pap smear for cervical cancer screening -     Cytology - PAP  Visit for screening mammogram -     MM DIGITAL SCREENING BILATERAL; Future  Healthcare maintenance -     Fecal occult blood, imunochemical  Essential hypertension, benign -     amLODipine (NORVASC) 10 MG tablet; Take 1 tablet (10 mg total) by mouth daily. -     spironolactone (ALDACTONE) 100 MG tablet; Take 1 tablet (100 mg total) by mouth daily. -     lisinopril (ZESTRIL) 10 MG tablet; Take 1 tablet (10 mg total) by mouth daily.   No orders of the defined types were placed in this encounter.   Follow-up: Return in about 6 months (around 12/19/2017) for HTN.   Boykin Nearing MD

## 2017-06-18 NOTE — Assessment & Plan Note (Signed)
A: HTN, nearly controlled Goal 120-130/80   P: Checking CMP Consider increase in lisinopril dose to reach goal  Weight loss Low salt diet

## 2017-06-18 NOTE — Patient Instructions (Addendum)
Wanda Kane was seen today for hypertension.  Diagnoses and all orders for this visit:  Essential hypertension -     CMP14+EGFR -     Lipid Panel -     Magnesium  Pap smear for cervical cancer screening -     Cytology - PAP  Visit for screening mammogram -     MM DIGITAL SCREENING BILATERAL; Future  Healthcare maintenance -     Fecal occult blood, imunochemical  Essential hypertension, benign -     amLODipine (NORVASC) 10 MG tablet; Take 1 tablet (10 mg total) by mouth daily. -     spironolactone (ALDACTONE) 100 MG tablet; Take 1 tablet (100 mg total) by mouth daily. -     lisinopril (ZESTRIL) 10 MG tablet; Take 1 tablet (10 mg total) by mouth daily.   Try tonic water which contine quinine for muscle cramps You will be called with pap smear and labs results  Flu shots will be available at the end of month you can get them here or at the flu clinics in [place]   F/u in 6 months for HTN sooner if needed with Dr. Karle Plumber   Dr. Adrian Blackwater

## 2017-06-19 ENCOUNTER — Telehealth: Payer: Self-pay | Admitting: Family Medicine

## 2017-06-19 LAB — CMP14+EGFR
ALK PHOS: 134 IU/L — AB (ref 39–117)
ALT: 22 IU/L (ref 0–32)
AST: 27 IU/L (ref 0–40)
Albumin/Globulin Ratio: 1.6 (ref 1.2–2.2)
Albumin: 4.7 g/dL (ref 3.5–5.5)
BILIRUBIN TOTAL: 0.2 mg/dL (ref 0.0–1.2)
BUN/Creatinine Ratio: 14 (ref 9–23)
BUN: 11 mg/dL (ref 6–24)
CHLORIDE: 105 mmol/L (ref 96–106)
CO2: 20 mmol/L (ref 20–29)
CREATININE: 0.79 mg/dL (ref 0.57–1.00)
Calcium: 10.4 mg/dL — ABNORMAL HIGH (ref 8.7–10.2)
GFR calc Af Amer: 97 mL/min/{1.73_m2} (ref >59)
GFR calc non Af Amer: 84 mL/min/{1.73_m2} (ref >59)
GLOBULIN, TOTAL: 2.9 g/dL (ref 1.5–4.5)
GLUCOSE: 81 mg/dL (ref 65–99)
Potassium: 4.3 mmol/L (ref 3.5–5.2)
SODIUM: 142 mmol/L (ref 134–144)
Total Protein: 7.6 g/dL (ref 6.0–8.5)

## 2017-06-19 LAB — LIPID PANEL
Chol/HDL Ratio: 4.2 ratio (ref 0.0–4.4)
Cholesterol, Total: 273 mg/dL — ABNORMAL HIGH (ref 100–199)
HDL: 65 mg/dL (ref >39)
LDL Calculated: 152 mg/dL — ABNORMAL HIGH (ref 0–99)
Triglycerides: 280 mg/dL — ABNORMAL HIGH (ref 0–149)
VLDL Cholesterol Cal: 56 mg/dL — ABNORMAL HIGH (ref 5–40)

## 2017-06-19 LAB — CERVICOVAGINAL ANCILLARY ONLY
CANDIDA VAGINITIS: NEGATIVE
CHLAMYDIA, DNA PROBE: NEGATIVE
NEISSERIA GONORRHEA: NEGATIVE
Trichomonas: NEGATIVE

## 2017-06-19 LAB — MAGNESIUM: Magnesium: 2.1 mg/dL (ref 1.6–2.3)

## 2017-06-19 NOTE — Telephone Encounter (Signed)
Patient called needing help on - Fecal occult blood, imunochemical test. Please f/up

## 2017-06-19 NOTE — Assessment & Plan Note (Signed)
Elevated total cholesterol and LDL  Currently taking pitavastatin 4 mg which is a moderate intensity stain  No additional treatment recommended

## 2017-06-22 ENCOUNTER — Encounter: Payer: Self-pay | Admitting: Family Medicine

## 2017-06-22 DIAGNOSIS — R87810 Cervical high risk human papillomavirus (HPV) DNA test positive: Secondary | ICD-10-CM | POA: Insufficient documentation

## 2017-06-22 LAB — CYTOLOGY - PAP
Diagnosis: NEGATIVE
HPV: DETECTED — AB

## 2017-06-22 MED FILL — JULUCA 50-25 MG TAB: 50-25 | 30 days supply | Qty: 30 | Fill #2

## 2017-06-22 MED FILL — lamiVUDine 300 MG TABS: 300 | 30 days supply | Qty: 30 | Fill #2

## 2017-06-22 NOTE — Assessment & Plan Note (Signed)
Pap smear reveals normal cells. HPV is present. HPV is a very common virus that is associated with increased risk of cervical cancer. Since HPV has been detected I recommend repeat pap smear in 1 year instead of 3 years. I have updated your health maintenance to reflect this.

## 2017-06-23 ENCOUNTER — Telehealth: Payer: Self-pay

## 2017-06-23 ENCOUNTER — Ambulatory Visit
Admission: RE | Admit: 2017-06-23 | Discharge: 2017-06-23 | Disposition: A | Discharge status: Home / Self Care | Payer: 59 | Source: Ambulatory Visit | Attending: Family Medicine | Admitting: Family Medicine

## 2017-06-23 DIAGNOSIS — Z1231 Encounter for screening mammogram for malignant neoplasm of breast: Secondary | ICD-10-CM | POA: Diagnosis not present

## 2017-06-23 NOTE — Telephone Encounter (Signed)
Pt was called to go over lab results. Pt states that she is aware of the results via mychart.

## 2017-06-24 ENCOUNTER — Other Ambulatory Visit: Payer: Self-pay | Admitting: Family Medicine

## 2017-06-24 DIAGNOSIS — Z0000 Encounter for general adult medical examination without abnormal findings: Secondary | ICD-10-CM

## 2017-06-24 DIAGNOSIS — R195 Other fecal abnormalities: Secondary | ICD-10-CM

## 2017-06-28 LAB — FECAL OCCULT BLOOD, IMMUNOCHEMICAL: FECAL OCCULT BLD: POSITIVE — AB

## 2017-06-29 ENCOUNTER — Encounter: Payer: Self-pay | Admitting: Family Medicine

## 2017-06-29 DIAGNOSIS — R195 Other fecal abnormalities: Secondary | ICD-10-CM | POA: Insufficient documentation

## 2017-06-29 NOTE — Addendum Note (Signed)
Addended by: Boykin Nearing on: 06/29/2017 03:40 PM   Modules accepted: Orders

## 2017-06-30 ENCOUNTER — Encounter: Payer: Self-pay | Admitting: Family Medicine

## 2017-06-30 ENCOUNTER — Encounter: Payer: Self-pay | Admitting: Gastroenterology

## 2017-06-30 DIAGNOSIS — R195 Other fecal abnormalities: Secondary | ICD-10-CM

## 2017-06-30 DIAGNOSIS — E669 Obesity, unspecified: Secondary | ICD-10-CM

## 2017-07-07 ENCOUNTER — Encounter: Payer: Self-pay | Admitting: Family Medicine

## 2017-07-07 ENCOUNTER — Ambulatory Visit (INDEPENDENT_AMBULATORY_CARE_PROVIDER_SITE_OTHER): Payer: 59 | Admitting: Family Medicine

## 2017-07-07 DIAGNOSIS — E669 Obesity, unspecified: Secondary | ICD-10-CM | POA: Diagnosis not present

## 2017-07-07 DIAGNOSIS — R7303 Prediabetes: Secondary | ICD-10-CM | POA: Diagnosis not present

## 2017-07-07 DIAGNOSIS — Z6836 Body mass index (BMI) 36.0-36.9, adult: Secondary | ICD-10-CM

## 2017-07-07 NOTE — Patient Instructions (Addendum)
  Diet Recommendations for Diabetes  Carbohydrate includes starch, sugar, and fiber.  Of these, only sugar and starch raise blood glucose.  (Fiber is found in fruits, vegetables [especially skin, seeds, and stocks] and whole grains.) Starchy (carb) foods: Bread, rice, pasta, potatoes, corn, cereal, grits, crackers, bagels, muffins, all baked goods.  (Fruits, milk, and yogurt also have carbohydrate, but most of these foods will not spike your blood sugar as most starchy foods will.)  A few fruits do cause high blood sugars; use small portions of bananas (limit to 1/2 at a time), grapes, watermelon, oranges, and most tropical fruits.   Protein foods: Meat, fish, poultry, eggs, dairy foods, and beans such as pinto and kidney beans (beans also provide carbohydrate).   1. Eat at least REAL 3 meals and 1 snack per day. Never go more than 4-5 hours while awake without eating. Eat breakfast within the first hour of getting up.   2. Limit starchy foods to TWO per meal and ONE per snack. ONE portion of a starchy  food is equal to the following:   - ONE slice of bread (or its equivalent, such as half of a hamburger bun).   - 1/2 cup of a "scoopable" starchy food such as potatoes or rice.   - 15 grams of Total Carbohydrate as shown on food label.  3. Include at every meal: a protein food, a carb food, and vegetables and/or fruit.   - Obtain twice the volume of veg's as protein or carbohydrate foods for both lunch and dinner.   - Fresh or frozen veg's are best.   - Keep frozen veg's on hand for a quick vegetable serving.      - What will make the difference between achieving these goals or not will is ADVANCE PLANNING.    - TASTE PREFERENCES ARE LEARNED.  This means it will get easier to choose foods you know are good for you if you are exposed to them enough.    - Pay attention to the foods you are drawn to when craving foods.  Are these comfort foods?  - Consider the [place] Living Well: Managing  Emotional Eating class.  Go to ClickDebate.gl; search for this class name.  Make sure you choose the [facility] location.    - Use the meal planning form provided today.

## 2017-07-07 NOTE — Progress Notes (Signed)
Medical Nutrition Therapy:  Appt start time: 1330 end time:  1430. PCP: Boykin Nearing, MD  Assessment:  Primary concerns today: Weight management and Blood sugar control.  [name] is a patient of Boykin Nearing, MD.  She would like to lose some weight, and have a better understanding of how to do this in a healthy way.  She is a Chartered certified accountant at [employer], and is also taking online classes, hoping to get into nursing school.    Health conditions include hypertension, pre-DM (A1C of 6.1 in 2014), and hyperlipidemia.   Learning Readiness: Change in progress; has increased physical activity recently and has made efforts to eat more fruit and veg's.    Usual eating pattern includes 3 meals and 0 snacks per day UNLESS not working, when she tends to graze.  Stormi says that her "kryptonite" is mostly bread.   Frequent foods and beverages include water, ~4 oz tonic water w/ quinine/day, lunch meat sandwiches, chx, eggs, steel cut oats, vegetarian sausage/bacon, chips, crackers, fruit.  Avoided foods include grapefruit (was told it interacts with one of her BP med's), soda, peas, milk & yogurt (lactose-intol), most soy foods.   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  Hopes to carve out time for more exercise.    No 24-hr recall b/c Milliani worked 16-hr shift - 7 AM Sunday to 11 PM Monday.  Normally works three 12-hr shifts in a row, but has recently been working some overtime hours.   A typical non-working day: Up ~7-8 AM; stretches first, drinks some lemon or lime juice in water; bkfst within first hour: Soy bacon & egg sandwich or steel cut oats with apple & egg on top; Lunch is a deli-meat sandwich with let&tom&chs, 1 tbsp Miracle Whip, chips, water; Dinner is rotisserie chx, or dinner out ([place] or [place]).    Progress Towards Goal(s):  In progress.   Nutritional Diagnosis:  -3.3 Overweight/obesity As related to energy  balance.  As evidenced by BMI >35.    Intervention:  Nutrition education  Handouts given during visit include:  AVS  Meal Planning form  Demonstrated degree of understanding via:  Teach Back  Barriers to learning/adherence to lifestyle change: time constraints and energy to do food prep; lack of pre-planning to avoid eating out.    Monitoring/Evaluation:  Dietary intake, exercise, and body weight in 9 week(s).  No appts available sooner.

## 2017-07-10 ENCOUNTER — Ambulatory Visit (HOSPITAL_COMMUNITY)
Admission: EM | Admit: 2017-07-10 | Discharge: 2017-07-10 | Disposition: A | Discharge status: Home / Self Care | Payer: 59 | Attending: Internal Medicine | Admitting: Internal Medicine

## 2017-07-10 ENCOUNTER — Encounter (HOSPITAL_COMMUNITY): Payer: Self-pay | Admitting: Emergency Medicine

## 2017-07-10 DIAGNOSIS — N390 Urinary tract infection, site not specified: Secondary | ICD-10-CM | POA: Diagnosis not present

## 2017-07-10 DIAGNOSIS — R319 Hematuria, unspecified: Secondary | ICD-10-CM | POA: Insufficient documentation

## 2017-07-10 DIAGNOSIS — Z79899 Other long term (current) drug therapy: Secondary | ICD-10-CM | POA: Diagnosis not present

## 2017-07-10 LAB — POCT URINALYSIS DIP (DEVICE)
GLUCOSE, UA: 100 mg/dL — AB
Ketones, ur: 15 mg/dL — AB
NITRITE: POSITIVE — AB
Protein, ur: 100 mg/dL — AB
Specific Gravity, Urine: 1.02 (ref 1.005–1.030)
UROBILINOGEN UA: 2 mg/dL — AB (ref 0.0–1.0)
pH: 5 (ref 5.0–8.0)

## 2017-07-10 MED ORDER — CEPHALEXIN 500 MG PO CAPS: 500.0000 mg | ORAL_CAPSULE | Freq: Four times a day (QID) | ORAL | 0 refills | Status: DC

## 2017-07-10 MED FILL — LISINOPRIL 10 MG TABLET: 10 | 90 days supply | Qty: 90 | Fill #0

## 2017-07-10 MED FILL — AMLODIPINE BESYLATE 10 MG T: 10 | 90 days supply | Qty: 90 | Fill #0

## 2017-07-10 MED FILL — spIRONOLACTONE 100 MG TAB: 100 | 90 days supply | Qty: 90 | Fill #0

## 2017-07-10 NOTE — ED Provider Notes (Signed)
[place]    CSN: [id number] Arrival date & time: 07/10/17  1953     History   Chief Complaint Chief Complaint  Patient presents with  . Urinary Tract Infection    HPI Wanda Kane is a 56 y.o. female.   The history is provided by the patient.  Urinary Tract Infection  Pain quality:  Aching Pain severity:  Mild Onset quality:  Gradual Duration:  2 days Timing:  Constant Progression:  Worsening Chronicity:  New Recent urinary tract infections: no   Relieved by:  Nothing Worsened by:  Nothing Ineffective treatments:  None tried Associated symptoms: no abdominal pain   Risk factors: sexually active   Risk factors: no sexually transmitted infections     Past Medical History:  Diagnosis Date  . DVT (deep venous thrombosis) ([place]) Jan 2016  . Fibroids 2010  . HIV infection ([place]) 1994  . Hypertension 1994  . Vaginal bleeding 12/2014   due to xarelto    Patient Active Problem List   Diagnosis Date Noted  . Positive fecal immunochemical test 06/29/2017  . Pap smear of cervix shows high risk HPV present 06/22/2017  . Hypertension 12/29/2014  . Perimenopause 10/26/2014  . Metabolic syndrome 82/42/3536  . Cutaneous skin tags 10/26/2014  . Back pain 05/16/2014  . Anemia 10/12/2013  . Myalgia 02/09/2013  . Condyloma acuminatum 10/27/2012  . Acquired acanthosis nigricans 10/27/2012  . Uterine fibroid 10/27/2012  . 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 HIV (human immunodeficiency virus infection) ([place]) 10/18/2012  . Dyslipidemia 10/18/2012  . Pre-diabetes 10/18/2012    Past Surgical History:  Procedure Laterality Date  . CESAREAN SECTION  1983  . CESAREAN SECTION    . TONSILLECTOMY AND ADENOIDECTOMY      OB History    Gravida Para Term Preterm AB Living   1 1 1  0 0 1   SAB TAB Ectopic Multiple Live Births   0 0 0 0         Home Medications    Prior to Admission medications   Medication Sig Start Date End Date Taking?  Authorizing Provider  amLODipine (NORVASC) 10 MG tablet Take 1 tablet (10 mg total) by mouth daily. 06/18/17  Yes Funches, Adriana Mccallum, MD  B-Complex-C TABS Take 1 tablet by mouth daily.   Yes [provider]  Dolutegravir-Rilpivirine (JULUCA) 50-25 MG TABS Take 1 tablet by mouth daily. 04/29/17  Yes Tommy Medal, Lavell Islam, MD  ferrous sulfate 325 (65 FE) MG tablet TAKE 1 TABLET BY MOUTH TWICE DAILY BEFORE A MEAL 12/22/16  Yes Tommy Medal, Lavell Islam, MD  lamivudine (EPIVIR) 300 MG tablet TAKE 1 TABLET BY MOUTH ONCE DAILY 04/07/17  Yes Tommy Medal, Lavell Islam, MD  lisinopril (ZESTRIL) 10 MG tablet Take 1 tablet (10 mg total) by mouth daily. 06/18/17  Yes Funches, Adriana Mccallum, MD  Multiple Vitamin (MULTIVITAMIN) capsule Take 1 capsule by mouth daily.   Yes [provider]  spironolactone (ALDACTONE) 100 MG tablet Take 1 tablet (100 mg total) by mouth daily. 06/18/17  Yes Funches, Josalyn, MD  cephALEXin (KEFLEX) 500 MG capsule Take 1 capsule (500 mg total) by mouth 4 (four) times daily. 07/10/17   Fransico Meadow, PA-C    Family History Family History  Problem Relation Age of Onset  . Hypertension Mother   . CAD Brother   . Heart disease Brother 10       cardiac arrest    Social History Social History  Substance Use Topics  .  Smoking status: Never Smoker  . Smokeless tobacco: Never Used  . Alcohol use No     Allergies   Viread [tenofovir disoproxil]   Review of Systems Review of Systems  Gastrointestinal: Negative for abdominal pain.  All other systems reviewed and are negative.    Physical Exam Triage Vital Signs ED Triage Vitals  Enc Vitals Group     BP 07/10/17 2017 (!) 109/58     Pulse Rate 07/10/17 2017 87     Resp 07/10/17 2017 20     Temp 07/10/17 2017 97.7 F (36.5 C)     Temp Source 07/10/17 2017 Oral     SpO2 07/10/17 2017 97 %     Weight --      Height --      Head Circumference --      Peak Flow --      Pain Score 07/10/17 2019 10     Pain Loc --      Pain  Edu? --      Excl. in [place]? --    No data found.   Updated Vital Signs BP (!) 109/58 (BP Location: Left Arm)   Pulse 87   Temp 97.7 F (36.5 C) (Oral)   Resp 20   LMP 05/31/2016 (Approximate)   SpO2 97%   Visual Acuity Right Eye Distance:   Left Eye Distance:   Bilateral Distance:    Right Eye Near:   Left Eye Near:    Bilateral Near:     Physical Exam  Constitutional: She appears well-developed and well-nourished. No distress.  HENT:  Head: Normocephalic and atraumatic.  Eyes: Conjunctivae are normal.  Neck: Neck supple.  Cardiovascular: Normal rate and regular rhythm.   No murmur heard. Pulmonary/Chest: Effort normal and breath sounds normal. No respiratory distress.  Abdominal: Soft. There is no tenderness.  Musculoskeletal: She exhibits no edema.  Neurological: She is alert.  Skin: Skin is warm and dry.  Psychiatric: She has a normal mood and affect.  Nursing note and vitals reviewed.    UC Treatments / Results  Labs (all labs ordered are listed, but only abnormal results are displayed) Labs Reviewed  POCT URINALYSIS DIP (DEVICE) - Abnormal; Notable for the following:       Result Value   Glucose, UA 100 (*)    Bilirubin Urine SMALL (*)    Ketones, ur 15 (*)    Hgb urine dipstick LARGE (*)    Protein, ur 100 (*)    Urobilinogen, UA 2.0 (*)    Nitrite POSITIVE (*)    Leukocytes, UA LARGE (*)    All other components within normal limits    EKG  EKG Interpretation None       Radiology No results found.  Procedures Procedures (including critical care time)  Medications Ordered in UC Medications - No data to display   Initial Impression / Assessment and Plan / UC Course  I have reviewed the triage vital signs and the nursing notes.  Pertinent labs & imaging results that were available during my care of the patient were reviewed by me and considered in my medical decision making (see chart for details).      Final Clinical Impressions(s)  / UC Diagnoses   Final diagnoses:  Urinary tract infection with hematuria, site unspecified    New Prescriptions New Prescriptions   CEPHALEXIN (KEFLEX) 500 MG CAPSULE    Take 1 capsule (500 mg total) by mouth 4 (four) times daily.    An  After Visit Summary was printed and given to the patient.  Controlled Substance Prescriptions [place] Controlled Substance Registry consulted? Not Applicable   Fransico Meadow, [place] 07/10/17 2102

## 2017-07-10 NOTE — ED Triage Notes (Signed)
Pt here for UTI sx onset 4 days associated w/dysuria, urinary freq/urgency  Denies fevers, n/v/d  Taking OTC Azo w/no relief.   A&O x4... NAD... Ambulatory

## 2017-07-13 ENCOUNTER — Encounter: Payer: Self-pay | Admitting: Internal Medicine

## 2017-07-13 ENCOUNTER — Encounter: Payer: Self-pay | Admitting: Infectious Disease

## 2017-07-13 ENCOUNTER — Ambulatory Visit: Payer: 59 | Attending: Internal Medicine | Admitting: Internal Medicine

## 2017-07-13 ENCOUNTER — Other Ambulatory Visit (HOSPITAL_COMMUNITY)
Admission: RE | Admit: 2017-07-13 | Discharge: 2017-07-13 | Disposition: A | Discharge status: Home / Self Care | Payer: 59 | Source: Ambulatory Visit | Attending: Internal Medicine | Admitting: Internal Medicine

## 2017-07-13 VITALS — BP 129/80 | HR 102 | Temp 98.6°F | Resp 18 | Ht 64.0 in | Wt 230.0 lb

## 2017-07-13 DIAGNOSIS — B20 Human immunodeficiency virus [HIV] disease: Secondary | ICD-10-CM | POA: Insufficient documentation

## 2017-07-13 DIAGNOSIS — Z6839 Body mass index (BMI) 39.0-39.9, adult: Secondary | ICD-10-CM | POA: Diagnosis not present

## 2017-07-13 DIAGNOSIS — I10 Essential (primary) hypertension: Secondary | ICD-10-CM

## 2017-07-13 DIAGNOSIS — Z888 Allergy status to other drugs, medicaments and biological substances status: Secondary | ICD-10-CM | POA: Insufficient documentation

## 2017-07-13 DIAGNOSIS — D509 Iron deficiency anemia, unspecified: Secondary | ICD-10-CM

## 2017-07-13 DIAGNOSIS — E6609 Other obesity due to excess calories: Secondary | ICD-10-CM

## 2017-07-13 DIAGNOSIS — N3000 Acute cystitis without hematuria: Secondary | ICD-10-CM | POA: Diagnosis not present

## 2017-07-13 DIAGNOSIS — E669 Obesity, unspecified: Secondary | ICD-10-CM | POA: Diagnosis not present

## 2017-07-13 DIAGNOSIS — I82409 Acute embolism and thrombosis of unspecified deep veins of unspecified lower extremity: Secondary | ICD-10-CM | POA: Insufficient documentation

## 2017-07-13 DIAGNOSIS — Z113 Encounter for screening for infections with a predominantly sexual mode of transmission: Secondary | ICD-10-CM | POA: Insufficient documentation

## 2017-07-13 DIAGNOSIS — L83 Acanthosis nigricans: Secondary | ICD-10-CM | POA: Insufficient documentation

## 2017-07-13 DIAGNOSIS — E8881 Metabolic syndrome: Secondary | ICD-10-CM | POA: Diagnosis not present

## 2017-07-13 DIAGNOSIS — D259 Leiomyoma of uterus, unspecified: Secondary | ICD-10-CM | POA: Diagnosis not present

## 2017-07-13 DIAGNOSIS — L918 Other hypertrophic disorders of the skin: Secondary | ICD-10-CM | POA: Diagnosis not present

## 2017-07-13 DIAGNOSIS — Z8249 Family history of ischemic heart disease and other diseases of the circulatory system: Secondary | ICD-10-CM | POA: Diagnosis not present

## 2017-07-13 DIAGNOSIS — D6959 Other secondary thrombocytopenia: Secondary | ICD-10-CM | POA: Diagnosis not present

## 2017-07-13 DIAGNOSIS — E785 Hyperlipidemia, unspecified: Secondary | ICD-10-CM | POA: Diagnosis not present

## 2017-07-13 DIAGNOSIS — Z9889 Other specified postprocedural states: Secondary | ICD-10-CM | POA: Diagnosis not present

## 2017-07-13 LAB — URINE CULTURE

## 2017-07-13 LAB — POCT URINALYSIS DIPSTICK
Bilirubin, UA: NEGATIVE
Blood, UA: NEGATIVE
Glucose, UA: NEGATIVE
Nitrite, UA: NEGATIVE
PH UA: 5.5 (ref 5.0–8.0)
SPEC GRAV UA: 1.015 (ref 1.010–1.025)
UROBILINOGEN UA: 1 U/dL

## 2017-07-13 NOTE — Patient Instructions (Signed)
Stop Iron. Continue to work on improving eating habits and trying to get in regular aerobic exercise.  Try to exercise 4 times a week for 25-30 minutes.

## 2017-07-13 NOTE — Progress Notes (Signed)
Patient ID: Wanda Kane, female    DOB: 1961-10-18  MRN: [id number]  CC: Establish Care   Subjective: Wanda Kane is a 56 y.o. female who presents for est care. Last saw Dr. Adrian Blackwater 05/2017 Her concerns today include:  56 yr old AAF with hx of HIV (followed by ID with undetected viral load), HTN, fibroid, HL, recent pap with pos HPV, DVT with filter.   1. HL: Lab Results  Component Value Date   CHOL 273 (H) 06/18/2017   HDL 65 06/18/2017   LDLCALC 152 (H) 06/18/2017   TRIG 280 (H) 06/18/2017   CHOLHDL 4.2 06/18/2017  -in a statin study blinded.  Taking med but do not know if she is on one or not. -had appt with dietitian recently. Started implementing suggested changes. 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 Trying to decide about type of exercise she would like to do outside of work  2. Concern about having UTI -Seen at urgent care last week with UTI symptoms. Urine dip positive. Culture grew Citrobacter. On Keflex. Symptoms resolving. Wanted to have UA rechecked today -would like STD screen. With same person for past 1 yr.  Partner also HIV + and undectectable viral load  3. HTN: Compliant with meds "I am a salt fanatic but I'm working on that."  4. Anemia: On iron x 1 yr Last CBC was normal.  Hx fibroid and was bleeding a lot 1 yr ago. No menses in 1 yr.   Patient Active Problem List   Diagnosis Date Noted  . Positive fecal immunochemical test 06/29/2017  . Pap smear of cervix shows high risk HPV present 06/22/2017  . Hypertension 12/29/2014  . Perimenopause 10/26/2014  . Metabolic syndrome 98/33/8250  . Cutaneous skin tags 10/26/2014  . Back pain 05/16/2014  . Anemia 10/12/2013  . Myalgia 02/09/2013  . Condyloma acuminatum 10/27/2012  . Acquired acanthosis nigricans 10/27/2012  . Uterine fibroid 10/27/2012  . 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 HIV (human immunodeficiency virus infection) ([place]) 10/18/2012    . Dyslipidemia 10/18/2012  . Pre-diabetes 10/18/2012     Current Outpatient Prescriptions on File Prior to Visit  Medication Sig Dispense Refill  . amLODipine (NORVASC) 10 MG tablet Take 1 tablet (10 mg total) by mouth daily. 90 tablet 3  . B-Complex-C TABS Take 1 tablet by mouth daily.    . cephALEXin (KEFLEX) 500 MG capsule Take 1 capsule (500 mg total) by mouth 4 (four) times daily. 40 capsule 0  . Dolutegravir-Rilpivirine (JULUCA) 50-25 MG TABS Take 1 tablet by mouth daily. 30 tablet 5  . ferrous sulfate 325 (65 FE) MG tablet TAKE 1 TABLET BY MOUTH TWICE DAILY BEFORE A MEAL 60 tablet 5  . lamivudine (EPIVIR) 300 MG tablet TAKE 1 TABLET BY MOUTH ONCE DAILY 30 tablet 3  . lisinopril (ZESTRIL) 10 MG tablet Take 1 tablet (10 mg total) by mouth daily. 90 tablet 3  . Multiple Vitamin (MULTIVITAMIN) capsule Take 1 capsule by mouth daily.    Marland Kitchen spironolactone (ALDACTONE) 100 MG tablet Take 1 tablet (100 mg total) by mouth daily. 90 tablet 3   No current facility-administered medications on file prior to visit.     Allergies  Allergen Reactions  . Viread [Tenofovir Disoproxil] Other (See Comments)    Rhabdomyolysis    Social History   Social History  . Marital status: Single    Spouse name: N/A  . Number of children: N/A  .  Years of education: college, BSW   Occupational History  . Nurse [employer]   Social History Main Topics  . Smoking status: Never Smoker  . Smokeless tobacco: Never Used  . Alcohol use No  . Drug use: No  . Sexual activity: Not Currently    Partners: Male     Comment: patient declined condoms   Other Topics Concern  . 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   She loves her job   Has BSW   Working towards Research officer, political party       Family History  Problem Relation Age of Onset  . Hypertension Mother   . CAD Brother   . Heart disease Brother 52       cardiac arrest    Past Surgical History:   Procedure Laterality Date  . CESAREAN SECTION  1983  . CESAREAN SECTION    . TONSILLECTOMY AND ADENOIDECTOMY      ROS: Review of Systems Negative except as stated above PHYSICAL EXAM: BP 129/80 (BP Location: Left Arm, Patient Position: Sitting, Cuff Size: Large)   Pulse (!) 102   Temp 98.6 F (37 C) (Oral)   Resp 18   Ht 5\' 4"  (1.626 m)   Wt 230 lb (104.3 kg)   LMP 05/31/2016 (Approximate)   SpO2 96%   BMI 39.48 kg/m   Physical Exam General appearance - alert, well appearing, obese African-American female and in no distress Mental status - alert, oriented to person, place, and time, normal mood, behavior, speech, dress, motor activity, and thought processes Neck - supple, no significant adenopathy Chest - clear to auscultation, no wheezes, rales or rhonchi, symmetric air entry Heart - normal rate, regular rhythm, normal S1, S2, no murmurs, rubs, clicks or gallops Extremities - peripheral pulses normal, no pedal edema, no clubbing or cyanosis  ASSESSMENT AND PLAN: 1. Essential hypertension At goal. Continue current medications Encourage low salt diet  2. Hyperlipidemia, unspecified hyperlipidemia type -Healthy eating habits and regular exercise counseling given  3. Acute cystitis without hematuria -Continue. NASAL mucus is sensitive to cefazolin  4. Iron deficiency anemia, unspecified iron deficiency anemia type -Given that she no longer has menstrual cycles that last CBC was normal, patient told to DC iron and less her antiviral medications predispose to anemia. She will find out from her ID doctor before stopping the iron  5. Class 2 obesity due to excess calories without serious comorbidity with body mass index (BMI) of 39.0 to 39.9 in adult See #2 above  6. Screen for STD (sexually transmitted disease) - Urine cytology ancillary only - RPR - Urinalysis Dipstick  Patient was given the opportunity to ask questions.  Patient verbalized understanding of the plan  and was able to repeat key elements of the plan.   Orders Placed This Encounter  Procedures  . RPR  . Urinalysis Dipstick     Requested Prescriptions    No prescriptions requested or ordered in this encounter    Return in about 4 months (around 11/12/2017).  Karle Plumber, MD, FACP

## 2017-07-14 ENCOUNTER — Encounter: Payer: Self-pay | Admitting: Internal Medicine

## 2017-07-14 LAB — RPR: RPR: NONREACTIVE

## 2017-07-15 LAB — URINE CYTOLOGY ANCILLARY ONLY
Chlamydia: NEGATIVE
NEISSERIA GONORRHEA: NEGATIVE
TRICH (WINDOWPATH): NEGATIVE

## 2017-07-17 MED FILL — lamiVUDine 300 MG TABS: 300 | 30 days supply | Qty: 30 | Fill #3

## 2017-07-17 MED FILL — JULUCA 50-25 MG TAB: 50-25 | 30 days supply | Qty: 30 | Fill #3

## 2017-08-04 ENCOUNTER — Encounter: Payer: Self-pay | Admitting: Licensed Clinical Social Worker

## 2017-08-05 ENCOUNTER — Encounter: Payer: Self-pay | Admitting: *Deleted

## 2017-08-05 ENCOUNTER — Encounter (INDEPENDENT_AMBULATORY_CARE_PROVIDER_SITE_OTHER): Payer: 59 | Admitting: *Deleted

## 2017-08-05 VITALS — BP 140/88 | HR 79 | Temp 98.3°F | Resp 16 | Wt 230.5 lb

## 2017-08-05 DIAGNOSIS — Z006 Encounter for examination for normal comparison and control in clinical research program: Secondary | ICD-10-CM

## 2017-08-05 NOTE — Progress Notes (Signed)
Serrena is here for her month 20 visit for Reprieve, A Randomized Trial to Prevent Vascular Events in HIV (study drug is Pitavastatin 4mg  or placebo). She has switched her ARVS to [place] and Epivir and is doing fine with them. She was treated for a UTI  In August which has cleared. She denies any other new problems or concerns. Informed consent was obtained for version 4.0 of Reprieve. She is due to return in January for the next study visit.

## 2017-08-06 LAB — PREGNANCY, URINE: Preg Test, Ur: NEGATIVE

## 2017-08-07 DIAGNOSIS — R195 Other fecal abnormalities: Secondary | ICD-10-CM | POA: Diagnosis not present

## 2017-08-07 MED FILL — MOVIPREP POWDER KIT: 100 | 1 days supply | Qty: 1 | Fill #0

## 2017-08-10 ENCOUNTER — Other Ambulatory Visit: Payer: Self-pay | Admitting: Infectious Disease

## 2017-08-10 ENCOUNTER — Encounter: Payer: Self-pay | Admitting: Internal Medicine

## 2017-08-10 ENCOUNTER — Encounter: Payer: Self-pay | Admitting: *Deleted

## 2017-08-10 DIAGNOSIS — B20 Human immunodeficiency virus [HIV] disease: Secondary | ICD-10-CM

## 2017-08-10 MED FILL — JULUCA 50-25 MG TAB: 50-25 | 30 days supply | Qty: 30 | Fill #4

## 2017-08-10 NOTE — Telephone Encounter (Signed)
Colonoscopy concern.

## 2017-08-12 ENCOUNTER — Encounter: Payer: Self-pay | Admitting: Infectious Disease

## 2017-08-12 ENCOUNTER — Ambulatory Visit (INDEPENDENT_AMBULATORY_CARE_PROVIDER_SITE_OTHER): Payer: 59 | Admitting: Infectious Disease

## 2017-08-12 VITALS — BP 113/81 | HR 79 | Temp 98.4°F | Wt 230.0 lb

## 2017-08-12 DIAGNOSIS — B20 Human immunodeficiency virus [HIV] disease: Secondary | ICD-10-CM | POA: Diagnosis not present

## 2017-08-12 DIAGNOSIS — D6959 Other secondary thrombocytopenia: Secondary | ICD-10-CM

## 2017-08-12 DIAGNOSIS — A6000 Herpesviral infection of urogenital system, unspecified: Secondary | ICD-10-CM

## 2017-08-12 DIAGNOSIS — A601 Herpesviral infection of perianal skin and rectum: Secondary | ICD-10-CM

## 2017-08-12 DIAGNOSIS — E785 Hyperlipidemia, unspecified: Secondary | ICD-10-CM

## 2017-08-12 DIAGNOSIS — I10 Essential (primary) hypertension: Secondary | ICD-10-CM | POA: Diagnosis not present

## 2017-08-12 DIAGNOSIS — Z0000 Encounter for general adult medical examination without abnormal findings: Secondary | ICD-10-CM | POA: Diagnosis not present

## 2017-08-12 HISTORY — DX: Herpesviral infection of urogenital system, unspecified: A60.00

## 2017-08-12 MED ORDER — VALACYCLOVIR HCL 1 G PO TABS: 1000.0000 mg | ORAL_TABLET | ORAL | 11 refills | Status: DC

## 2017-08-12 MED ORDER — DOLUTEGRAVIR-RILPIVIRINE 50-25 MG PO TABS: 1.0000 | ORAL_TABLET | Freq: Every day | ORAL | 11 refills | Status: DC

## 2017-08-12 MED FILL — valACYclovir HCL 1 GM TABS: 1 | 27 days supply | Qty: 34 | Fill #0

## 2017-08-12 NOTE — Progress Notes (Signed)
Chief complaint: Vesicular papular abruption on buttocks that she is concerned could be herpes versus zoster Subjective:    Patient ID: Wanda Kane, female    DOB: 1961-05-16, 56 y.o.   MRN: [id number]  HPI  Wanda Kane is a 56 year-old [place] American lady with HIV previous he cared for [facility] and then moved to [place]. She had been on various regimens in 1990s and then on drug holiday. Later while in [place] she apparently was on various antiretroviral regimens lastly on Isent[place] and complera.   She moved back was seen by Dr. Baxter Kane in February. Her regimen was simplified to STRIBILD. She had tolerated this regimen without to much complaint other some nausea and loose stools.   However week prior to her seeing me in March 2014 she had  developed increasingly severe diffuse myalgias in her neck arms legs. She had similar symptoms in the past while out on antiretrovirals. She could not initially recall which antiretrovirals at apparently been blamed for the symptoms. However when I examined her allergy history he or she is listed as being allergic to tenofovir with alleged   tenofovir-induced rhabdomyolysis.  I  therefore crafted a new ARV regimen for her without TNF, namely once daily Tivicay, Edurant and Epivir. I  checked her for elevated CPK  Which was at 237. Since change to new TNF free regimen she had down titration of her CK to normal but then  back above 200 again.   She had initially not been seen by Rheum due to lack of insurance.  I offered further Rheum workup with labs was unrevealing  We discussed option of going to 2 drug regimen of JULUCA (DTG/RPV) and she is interested in it but would like to wait for summer time before making a switch.  She did in fact make the switch to [facility] but had been continuing her Lamivudine as well which is not what I was trying to trial with her, rather I was moving to treat her based on what JULUCA is FDA approved for with 2 drug  regimen for stably suppressed pts.   We will therefore make that change now and recheck her labs in one month. She has had an eruption of a vesicular papular area on her buttocks and she said she had approximately a year ago as well. She wondered whether could be zoster herpes at told her was most likely herpetic in nature given that there was not a a preceding painful pathology and that the location was classic for herpes type II or type I genital herpes eruptions.  Lab Results  Component Value Date   HIV1RNAQUANT <20 11/21/2016   Lab Results  Component Value Date   CD4TABS 890 11/21/2016   CD4TABS 600 11/08/2015   CD4TABS 670 09/29/2014    Past Medical History:  Diagnosis Date  . DVT (deep venous thrombosis) ([place]) Jan 2016  . Fibroids 2010  . HIV infection ([place]) 1994  . Hypertension 1994  . Vaginal bleeding 12/2014   due to xarelto    Past Surgical History:  Procedure Laterality Date  . CESAREAN SECTION  1983  . CESAREAN SECTION    . TONSILLECTOMY AND ADENOIDECTOMY      Family History  Problem Relation Age of Onset  . Hypertension Mother   . CAD Brother   . Heart disease Brother 83       cardiac arrest      Social History   Social History  . Marital status: Single  Spouse name: N/A  . Number of children: N/A  . Years of education: college, BSW   Occupational History  . Nurse Wanda Kane   Social History Main Topics  . Smoking status: Never Smoker  . Smokeless tobacco: Never Used  . Alcohol use No  . Drug use: No  . Sexual activity: Not Currently    Partners: Male     Comment: patient declined condoms   Other Topics Concern  . 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   She loves her job   Has BSW   Working towards LPN and RN       Allergies  Allergen Reactions  . Viread [Tenofovir Disoproxil] Other (See Comments)    Rhabdomyolysis     Current Outpatient Prescriptions:  .  amLODipine (NORVASC) 10  MG tablet, Take 1 tablet (10 mg total) by mouth daily., Disp: 90 tablet, Rfl: 3 .  B-Complex-C TABS, Take 1 tablet by mouth daily., Disp: , Rfl:  .  Dolutegravir-Rilpivirine (JULUCA) 50-25 MG TABS, Take 1 tablet by mouth daily., Disp: 30 tablet, Rfl: 5 .  ferrous sulfate 325 (65 FE) MG tablet, TAKE 1 TABLET BY MOUTH TWICE DAILY BEFORE A MEAL, Disp: 60 tablet, Rfl: 5 .  lamivudine (EPIVIR) 300 MG tablet, TAKE 1 TABLET BY MOUTH ONCE DAILY, Disp: 30 tablet, Rfl: 3 .  lisinopril (ZESTRIL) 10 MG tablet, Take 1 tablet (10 mg total) by mouth daily., Disp: 90 tablet, Rfl: 3 .  Multiple Vitamin (MULTIVITAMIN) capsule, Take 1 capsule by mouth daily., Disp: , Rfl:  .  spironolactone (ALDACTONE) 100 MG tablet, Take 1 tablet (100 mg total) by mouth daily., Disp: 90 tablet, Rfl: 3 .  cephALEXin (KEFLEX) 500 MG capsule, Take 1 capsule (500 mg total) by mouth 4 (four) times daily. (Patient not taking: Reported on 08/12/2017), Disp: 40 capsule, Rfl: 0    Review of Systems  Constitutional: Negative for activity change, appetite change, chills, diaphoresis, fatigue, fever and unexpected weight change.  HENT: Negative for congestion, rhinorrhea, sinus pressure, sneezing, sore throat and trouble swallowing.   Eyes: Negative for photophobia and visual disturbance.  Respiratory: Negative for cough, chest tightness, shortness of breath, wheezing and stridor.   Cardiovascular: Negative for chest pain, palpitations and leg swelling.  Gastrointestinal: Negative for abdominal distention, abdominal pain, anal bleeding, blood in stool, constipation, diarrhea, nausea and vomiting.  Genitourinary: Negative for difficulty urinating, dysuria, flank pain and hematuria.  Musculoskeletal: Negative for arthralgias, back pain, gait problem, joint swelling, myalgias and neck stiffness.  Skin: Positive for rash. Negative for color change, pallor and wound.  Neurological: Negative for dizziness, tremors, weakness and light-headedness.   Hematological: Negative for adenopathy. Does not bruise/bleed easily.  Psychiatric/Behavioral: Negative for agitation, behavioral problems, confusion, decreased concentration, dysphoric mood and sleep disturbance.       Objective:   Physical Exam  Constitutional: She is oriented to person, place, and time. She appears well-developed and well-nourished. No distress.  HENT:  Head: Normocephalic and atraumatic.  Mouth/Throat: Oropharynx is clear and moist. No oropharyngeal exudate.  Eyes: Conjunctivae and EOM are normal. No scleral icterus.  Neck: Normal range of motion. Neck supple. No JVD present.  Cardiovascular: Normal rate, regular rhythm and normal heart sounds.   Pulmonary/Chest: Effort normal and breath sounds normal. No respiratory distress.  Abdominal: She exhibits no distension and no mass. There is no tenderness. There is no rebound and no guarding.  Musculoskeletal: She exhibits  no edema or tenderness.  Lymphadenopathy:    She has no cervical adenopathy.  Neurological: She is alert and oriented to person, place, and time. She exhibits normal muscle tone. Coordination normal.  Skin: Skin is warm and dry. She is not diaphoretic. No erythema. No pallor.  Psychiatric: She has a normal mood and affect. Her behavior is normal. Judgment and thought content normal.    Examination of buttocks was deferred      Assessment & Plan:    HIV: switch to [place] and DC the Lamivudine. 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 I told her that this would be very  unlikely to occur with her.   I spent greater than 25 minutes with the patient including greater than 50% of time in face to face counsel of Wanda Kane including time spent reviewing the data from clinical trials regarding JULUCA and how we would like to see how she does on this regimen alone--which should be quite well, also re how HSV2 is transmitted and how this could have been a remote infection that only recently emerged and in and in coordination of her care.

## 2017-08-20 ENCOUNTER — Encounter: Payer: 59 | Admitting: Gastroenterology

## 2017-09-07 MED FILL — JULUCA 50-25 MG TAB: 50-25 | 30 days supply | Qty: 30 | Fill #5

## 2017-09-10 ENCOUNTER — Ambulatory Visit: Payer: 59 | Admitting: Family Medicine

## 2017-09-23 ENCOUNTER — Ambulatory Visit: Payer: 59 | Admitting: Infectious Disease

## 2017-09-29 MED FILL — JULUCA 50-25 MG TAB: 50-25 | 30 days supply | Qty: 30 | Fill #0

## 2017-10-07 ENCOUNTER — Encounter: Payer: Self-pay | Admitting: Infectious Disease

## 2017-10-07 MED FILL — LISINOPRIL 10 MG TABS: 10 | 90 days supply | Qty: 90 | Fill #1

## 2017-10-07 MED FILL — SPIRONOLACTONE 100 MG TAB: 100 | 90 days supply | Qty: 90 | Fill #1

## 2017-10-07 MED FILL — AMLODIPINE BESYLATE 10 MG T: 10 | 90 days supply | Qty: 90 | Fill #1

## 2017-10-12 ENCOUNTER — Other Ambulatory Visit: Payer: 59

## 2017-10-14 ENCOUNTER — Other Ambulatory Visit: Payer: 59

## 2017-10-14 DIAGNOSIS — B20 Human immunodeficiency virus [HIV] disease: Secondary | ICD-10-CM | POA: Diagnosis not present

## 2017-10-15 LAB — T-HELPER CELL (CD4) - (RCID CLINIC ONLY)
CD4 % Helper T Cell: 20 % — ABNORMAL LOW (ref 33–55)
CD4 T Cell Abs: 950 /uL (ref 400–2700)

## 2017-10-16 LAB — HIV-1 RNA QUANT-NO REFLEX-BLD
HIV 1 RNA QUANT: NOT DETECTED {copies}/mL
HIV-1 RNA QUANT, LOG: NOT DETECTED {Log_copies}/mL

## 2017-10-23 ENCOUNTER — Encounter: Payer: Self-pay | Admitting: Pharmacist

## 2017-10-26 ENCOUNTER — Ambulatory Visit: Payer: 59 | Admitting: Infectious Disease

## 2017-11-05 ENCOUNTER — Encounter: Payer: Self-pay | Admitting: Infectious Disease

## 2017-11-11 MED FILL — JULUCA 50-25 MG TAB: 50-25 | 30 days supply | Qty: 30 | Fill #1

## 2017-11-12 ENCOUNTER — Ambulatory Visit: Payer: 59 | Admitting: Internal Medicine

## 2017-11-18 ENCOUNTER — Encounter: Payer: Self-pay | Admitting: Infectious Disease

## 2017-11-18 ENCOUNTER — Ambulatory Visit (INDEPENDENT_AMBULATORY_CARE_PROVIDER_SITE_OTHER): Payer: 59 | Admitting: Infectious Disease

## 2017-11-18 VITALS — BP 155/62 | HR 91 | Temp 98.1°F | Ht 64.0 in | Wt 228.0 lb

## 2017-11-18 DIAGNOSIS — E785 Hyperlipidemia, unspecified: Secondary | ICD-10-CM

## 2017-11-18 DIAGNOSIS — I10 Essential (primary) hypertension: Secondary | ICD-10-CM

## 2017-11-18 DIAGNOSIS — B20 Human immunodeficiency virus [HIV] disease: Secondary | ICD-10-CM | POA: Diagnosis not present

## 2017-11-18 NOTE — Progress Notes (Signed)
Chief complaint: followup for HIV disease on meds Subjective:    Patient ID: Wanda Kane, female    DOB: 05-Jan-1961, 57 y.o.   MRN: [id number]  HPI  Wanda Kane is a 57 year-old [place] American lady with HIV previous he cared for [facility] and then moved to [place]. She had been on various regimens in 1990s and then on drug holiday. Later while in [place] she apparently was on various antiretroviral regimens lastly on Isent[place] and complera.   She moved back was seen by Dr. Baxter Flattery in February. Her regimen was simplified to STRIBILD. She had tolerated this regimen without to much complaint other some nausea and loose stools.   However week prior to her seeing me in March 2014 she had  developed increasingly severe diffuse myalgias in her neck arms legs. She had similar symptoms in the past while out on antiretrovirals. She could not initially recall which antiretrovirals at apparently been blamed for the symptoms. However when I examined her allergy history he or she is listed as being allergic to tenofovir with alleged   tenofovir-induced rhabdomyolysis.  I  therefore crafted a new ARV regimen for her without TNF, namely once daily Tivicay, Edurant and Epivir. I  checked her for elevated CPK  Which was at 237. Since change to new TNF free regimen she had down titration of her CK to normal but then  back above 200 again.   She had initially not been seen by Rheum due to lack of insurance.  I offered further Rheum workup with labs was unrevealing  We discussed option of going to 2 drug regimen of JULUCA (DTG/RPV) and she is interested in it but would like to wait for summer time before making a switch.  She did in fact make the switch to [facility] but had been continuing her Lamivudine as well which is not what I was trying to trial with her, rather I was moving to treat her based on what JULUCA is FDA approved for with 2 drug regimen for stably suppressed pts.   She was definitely  switched to [place] alone and she is very happy with this regimen. She is trying go to go to [employer] school.  Lab Results  Component Value Date   HIV1RNAQUANT <20 NOT DETECTED 10/14/2017   HIV1RNAQUANT <20 11/21/2016   HIV1RNAQUANT <20 11/08/2015     Lab Results  Component Value Date   CD4TABS 950 10/14/2017   CD4TABS 890 11/21/2016   CD4TABS 600 11/08/2015    Past Medical History:  Diagnosis Date  . DVT (deep venous thrombosis) ([place]) Jan 2016  . Fibroids 2010  . Genital herpes 08/12/2017  . HIV infection ([place]) 1994  . Hypertension 1994  . Vaginal bleeding 12/2014   due to xarelto    Past Surgical History:  Procedure Laterality Date  . CESAREAN SECTION  1983  . CESAREAN SECTION    . TONSILLECTOMY AND ADENOIDECTOMY      Family History  Problem Relation Age of Onset  . Hypertension Mother   . CAD Brother   . Heart disease Brother 21       cardiac arrest      Social History   Socioeconomic History  . Marital status: Single    Spouse name: Not on file  . Number of children: Not on file  . Years of education: college, [place]  . Highest education level: Not on file  Social Needs  . Financial resource strain: Not on file  . Food  insecurity - worry: Not on file  . Food insecurity - inability: Not on file  . Transportation needs - medical: Not on file  . Transportation needs - non-medical: Not on file  Occupational History  . Occupation: Research scientist (life sciences): [employer]  Tobacco Use  . Smoking status: Never Smoker  . Smokeless tobacco: Never Used  Substance and Sexual Activity  . Alcohol use: No  . Drug use: No  . Sexual activity: Not Currently    Partners: Male    Comment: patient declined condoms  Other Topics Concern  . 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   She loves her job   Has BSW   Working towards LPN and RN    Allergies  Allergen Reactions  . Pepcid [Famotidine] Other (See Comments)  . Prilosec  [Omeprazole] Other (See Comments)  . Viread [Tenofovir Disoproxil] Other (See Comments)    Rhabdomyolysis     Current Outpatient Medications:  .  amLODipine (NORVASC) 10 MG tablet, Take 1 tablet (10 mg total) by mouth daily., Disp: 90 tablet, Rfl: 3 .  B-Complex-C TABS, Take 1 tablet by mouth daily., Disp: , Rfl:  .  Dolutegravir-Rilpivirine (JULUCA) 50-25 MG TABS, Take 1 tablet by mouth daily. Lamivudine may be DC'D, Disp: 30 tablet, Rfl: 11 .  ferrous sulfate 325 (65 FE) MG tablet, TAKE 1 TABLET BY MOUTH TWICE DAILY BEFORE A MEAL, Disp: 60 tablet, Rfl: 5 .  lisinopril (ZESTRIL) 10 MG tablet, Take 1 tablet (10 mg total) by mouth daily., Disp: 90 tablet, Rfl: 3 .  Multiple Vitamin (MULTIVITAMIN) capsule, Take 1 capsule by mouth daily., Disp: , Rfl:  .  spironolactone (ALDACTONE) 100 MG tablet, Take 1 tablet (100 mg total) by mouth daily., Disp: 90 tablet, Rfl: 3 .  valACYclovir (VALTREX) 1000 MG tablet, Take 1 tablet (1,000 mg total) by mouth as directed. Take one tablet BID x 7 days then one tablet daily, Disp: 34 tablet, Rfl: 11    Review of Systems  Constitutional: Negative for activity change, appetite change, chills, diaphoresis, fatigue, fever and unexpected weight change.  HENT: Negative for congestion, rhinorrhea, sinus pressure, sneezing, sore throat and trouble swallowing.   Eyes: Negative for photophobia and visual disturbance.  Respiratory: Negative for cough, chest tightness, shortness of breath, wheezing and stridor.   Cardiovascular: Negative for chest pain, palpitations and leg swelling.  Gastrointestinal: Negative for abdominal distention, abdominal pain, anal bleeding, blood in stool, constipation, diarrhea, nausea and vomiting.  Genitourinary: Negative for difficulty urinating, dysuria, flank pain and hematuria.  Musculoskeletal: Negative for arthralgias, back pain, gait problem, joint swelling, myalgias and neck stiffness.  Skin: Positive for rash. Negative for color  change, pallor and wound.  Neurological: Negative for dizziness, tremors, weakness and light-headedness.  Hematological: Negative for adenopathy. Does not bruise/bleed easily.  Psychiatric/Behavioral: Negative for agitation, behavioral problems, confusion, decreased concentration, dysphoric mood and sleep disturbance.       Objective:   Physical Exam  Constitutional: She is oriented to person, place, and time. She appears well-developed and well-nourished. No distress.  HENT:  Head: Normocephalic and atraumatic.  Mouth/Throat: Oropharynx is clear and moist. No oropharyngeal exudate.  Eyes: Conjunctivae and EOM are normal. No scleral icterus.  Neck: Normal range of motion. Neck supple. No JVD present.  Cardiovascular: Normal rate, regular rhythm and normal heart sounds.  Pulmonary/Chest: Effort normal and breath sounds normal. No respiratory distress.  Abdominal: She exhibits  no distension and no mass. There is no tenderness. There is no rebound and no guarding.  Musculoskeletal: She exhibits no edema or tenderness.  Lymphadenopathy:    She has no cervical adenopathy.  Neurological: She is alert and oriented to person, place, and time. She exhibits normal muscle tone. Coordination normal.  Skin: Skin is warm and dry. She is not diaphoretic. No erythema. No pallor.  Psychiatric: She has a normal mood and affect. Her behavior is normal. Judgment and thought content normal.       Assessment & Plan:    HIV: continuue JULUCA  ALONE and consider study of RPV and CAB that we might be able to do here  Myositis: not clear wha had been  causing this but she is symptomatically better TNF, probably NEVER had anything to do with   HTN: BP worse she will followup with PCP  Vitals:   11/18/17 1522  BP: (!) 155/62  Pulse: 91  Temp: 98.1 F (36.7 C)     HSV: no recent problems

## 2017-11-24 ENCOUNTER — Encounter (INDEPENDENT_AMBULATORY_CARE_PROVIDER_SITE_OTHER): Payer: 59 | Admitting: *Deleted

## 2017-11-24 VITALS — BP 126/81 | HR 87 | Temp 98.5°F | Wt 225.0 lb

## 2017-11-24 DIAGNOSIS — Z006 Encounter for examination for normal comparison and control in clinical research program: Secondary | ICD-10-CM

## 2017-11-24 LAB — POCT URINE PREGNANCY: Preg Test, Ur: NEGATIVE

## 2017-11-24 NOTE — Addendum Note (Signed)
Addended by: Bobbie Stack on: 11/24/2017 10:18 AM   Modules accepted: Orders

## 2017-11-24 NOTE — Progress Notes (Signed)
Wanda Kane is here for her month 24 visit for [employer]. She denies any new health problems or concerns. She hopes to get into nursing school this Fall and is working as a Chartered certified accountant at [employer]. She will be returning in May for the next study visit.

## 2017-11-26 ENCOUNTER — Ambulatory Visit: Payer: 59 | Admitting: Internal Medicine

## 2017-12-09 MED FILL — JULUCA 50-25 MG TAB: 50-25 | 30 days supply | Qty: 30 | Fill #2

## 2018-01-01 ENCOUNTER — Encounter: Payer: Self-pay | Admitting: Internal Medicine

## 2018-01-01 ENCOUNTER — Ambulatory Visit: Payer: 59 | Attending: Internal Medicine | Admitting: Internal Medicine

## 2018-01-01 VITALS — BP 130/84 | HR 95 | Temp 99.0°F | Resp 16 | Wt 232.0 lb

## 2018-01-01 DIAGNOSIS — B20 Human immunodeficiency virus [HIV] disease: Secondary | ICD-10-CM | POA: Diagnosis not present

## 2018-01-01 DIAGNOSIS — B351 Tinea unguium: Secondary | ICD-10-CM | POA: Insufficient documentation

## 2018-01-01 DIAGNOSIS — Z79899 Other long term (current) drug therapy: Secondary | ICD-10-CM | POA: Insufficient documentation

## 2018-01-01 DIAGNOSIS — I10 Essential (primary) hypertension: Secondary | ICD-10-CM | POA: Diagnosis not present

## 2018-01-01 DIAGNOSIS — E785 Hyperlipidemia, unspecified: Secondary | ICD-10-CM | POA: Diagnosis not present

## 2018-01-01 DIAGNOSIS — R195 Other fecal abnormalities: Secondary | ICD-10-CM

## 2018-01-01 DIAGNOSIS — E6609 Other obesity due to excess calories: Secondary | ICD-10-CM

## 2018-01-01 DIAGNOSIS — Z86718 Personal history of other venous thrombosis and embolism: Secondary | ICD-10-CM | POA: Diagnosis not present

## 2018-01-01 DIAGNOSIS — E669 Obesity, unspecified: Secondary | ICD-10-CM | POA: Insufficient documentation

## 2018-01-01 DIAGNOSIS — D649 Anemia, unspecified: Secondary | ICD-10-CM | POA: Insufficient documentation

## 2018-01-01 DIAGNOSIS — Z6839 Body mass index (BMI) 39.0-39.9, adult: Secondary | ICD-10-CM | POA: Diagnosis not present

## 2018-01-01 MED ORDER — CICLOPIROX 8 % EX SOLN: Freq: Every day | CUTANEOUS | 1 refills | Status: DC

## 2018-01-01 NOTE — Patient Instructions (Signed)

## 2018-01-01 NOTE — Progress Notes (Signed)
Patient ID: Wanda Kane, female    DOB: 03/27/1961  MRN: [id number]  CC: Hypertension   Subjective: Wanda Kane is a 57 y.o. female who presents for chronic ds management.  Last seen 06/2017. Her concerns today include:  57 yr old AAF with hx of HIV (followed by ID with undetected viral load), HTN, fibroid, HL, recent pap with pos HPV, DVT with filter.   1.  HL:  Still in blinded statin study.  2.  Anemia:  She stopped iron as recommended on last visit since she no longer has periods and last CBC was nl.   3.  Colon CA screening:  Pos FIT test. Her co-pay for c-scope is $1000 which she can not afford at this time -BM are regular.  A little blood in stools at times which she thinks is from hemorrhoids.  No family history of colon cancer.  4.  HTN:  Compliant with meds  5.  Obesity: Eating habits have not been the best lately.  She is very stressed about her personal situation with her father and as a result has been doing a lot of eating stress .   Eating more take outs -She joined the gym about a week and a half ago. -Started working night shifts which she feels may negatively impact her activity level during the day.  She is very active on the job  6. C/o return of nail fungus on both big toenails.  Sweat a lot in her shoes.  When she gets home she lays in bed with a wet socks on instead of taking them off.  She started using an over-the-counter antifungal topical solution.  Not sure if she is seeing any improvement.  She does not want to try any orals like Lamisil not knowing how it may impact her antiretroviral drugs. Patient Active Problem List   Diagnosis Date Noted  . Genital herpes 08/12/2017  . Positive fecal immunochemical test 06/29/2017  . Pap smear of cervix shows high risk HPV present 06/22/2017  . Hypertension 12/29/2014  . Perimenopause 10/26/2014  . Metabolic syndrome 81/85/6314  . Cutaneous skin tags 10/26/2014  . Back pain 05/16/2014  . Anemia 10/12/2013  .  Myalgia 02/09/2013  . Condyloma acuminatum 10/27/2012  . Acquired acanthosis nigricans 10/27/2012  . Uterine fibroid 10/27/2012  . 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 HIV (human immunodeficiency virus infection) ([place]) 10/18/2012  . Dyslipidemia 10/18/2012  . Pre-diabetes 10/18/2012     Current Outpatient Medications on File Prior to Visit  Medication Sig Dispense Refill  . amLODipine (NORVASC) 10 MG tablet Take 1 tablet (10 mg total) by mouth daily. 90 tablet 3  . B-Complex-C TABS Take 1 tablet by mouth daily.    . Dolutegravir-Rilpivirine (JULUCA) 50-25 MG TABS Take 1 tablet by mouth daily. Lamivudine may be DC'D 30 tablet 11  . ferrous sulfate 325 (65 FE) MG tablet TAKE 1 TABLET BY MOUTH TWICE DAILY BEFORE A MEAL (Patient not taking: Reported on 11/18/2017) 60 tablet 5  . lisinopril (ZESTRIL) 10 MG tablet Take 1 tablet (10 mg total) by mouth daily. 90 tablet 3  . Multiple Vitamin (MULTIVITAMIN) capsule Take 1 capsule by mouth daily.    Marland Kitchen spironolactone (ALDACTONE) 100 MG tablet Take 1 tablet (100 mg total) by mouth daily. 90 tablet 3  . valACYclovir (VALTREX) 1000 MG tablet Take 1 tablet (1,000 mg total) by mouth as directed. Take one tablet BID x 7 days then one tablet daily (Patient not taking:  Reported on 11/18/2017) 34 tablet 11   No current facility-administered medications on file prior to visit.     Allergies  Allergen Reactions  . Pepcid [Famotidine] Other (See Comments)  . Prilosec [Omeprazole] Other (See Comments)  . Viread [Tenofovir Disoproxil] Other (See Comments)    Rhabdomyolysis    Social History   Socioeconomic History  . Marital status: Single    Spouse name: Not on file  . Number of children: Not on file  . Years of education: college, [place]  . Highest education level: Not on file  Social Needs  . Financial resource strain: Not on file  . Food insecurity - worry: Not on file  . Food insecurity - inability: Not on file  . Transportation  needs - medical: Not on file  . Transportation needs - non-medical: Not on file  Occupational History  . Occupation: Research scientist (life sciences): [employer]  Tobacco Use  . Smoking status: Never Smoker  . Smokeless tobacco: Never Used  Substance and Sexual Activity  . Alcohol use: No  . Drug use: No  . Sexual activity: Not Currently    Partners: Male    Comment: patient declined condoms  Other Topics Concern  . 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   She loves her job   Has BSW   Working towards Research officer, political party    Family History  Problem Relation Age of Onset  . Hypertension Mother   . CAD Brother   . Heart disease Brother 26       cardiac arrest    Past Surgical History:  Procedure Laterality Date  . CESAREAN SECTION  1983  . CESAREAN SECTION    . TONSILLECTOMY AND ADENOIDECTOMY      ROS: Review of Systems Negative except as stated above   PHYSICAL EXAM: BP 130/84   Pulse 95   Temp 99 F (37.2 C) (Oral)   Resp 16   Wt 232 lb (105.2 kg)   LMP 05/31/2016 (Approximate)   SpO2 96%   BMI 39.82 kg/m   Wt Readings from Last 3 Encounters:  01/01/18 232 lb (105.2 kg)  11/24/17 225 lb (102.1 kg)  11/18/17 228 lb (103.4 kg)   Physical Exam  General appearance - alert, well appearing, and in no distress Mental status - alert, oriented to person, place, and time, normal mood, behavior, speech, dress, motor activity, and thought processes Neck - supple, no significant adenopathy Chest - clear to auscultation, no wheezes, rales or rhonchi, symmetric air entry Heart - normal rate, regular rhythm, normal S1, S2, no murmurs, rubs, clicks or gallops Extremities - peripheral pulses normal, no pedal edema, no clubbing or cyanosis Nails: Area of discoloration and thickness on toenails of both big toes   Depression screen [facility] 2/9 01/01/2018 11/18/2017 08/12/2017  Decreased Interest 0 0 0  Down, Depressed, Hopeless 0 0 0  PHQ - 2 Score  0 0 0  Altered sleeping - - -  Tired, decreased energy - - -  Change in appetite - - -  Feeling bad or failure about yourself  - - -  Trouble concentrating - - -  Moving slowly or fidgety/restless - - -  Suicidal thoughts - - -  PHQ-9 Score - - -  Some recent data might be hidden    ASSESSMENT AND PLAN: 1. Essential hypertension At goal.  Continue lisinopril, amlodipine, and spironolactone - CBC -  Lipid panel  2. Hyperlipidemia, unspecified hyperlipidemia type - Lipid panel  3. Positive FIT (fecal immunochemical test) -Patient will work on saving her co-pay so that she can have the colonoscopy done as soon as possible  4. Nail fungus -Patient advised that oral antifungals worked best for nail fungus.  However if she is interested in trying a prescription topical, I would recommend Penlac I went over with her how to apply. - ciclopirox (PENLAC) 8 % solution; Apply topically at bedtime. Apply over nail QHS.  Apply  Daily over previous coat. After (7) days, may remove with alcohol and continue cycle.  Dispense: 6.6 mL; Refill: 1  5. Class 2 obesity due to excess calories without serious comorbidity with body mass index (BMI) of 39.0 to 39.9 in adult -Discussed healthy eating habits.  She has not been eating fruits because she failed the sugar and fruits may contribute to her becoming diabetic.  I advised her that it is recommended fresh fruits and vegetables be incorporated into heart healthy diet.  Recommend 2-3 servings a day  Patient was given the opportunity to ask questions.  Patient verbalized understanding of the plan and was able to repeat key elements of the plan.   Orders Placed This Encounter  Procedures  . CBC  . Lipid panel     Requested Prescriptions   Signed Prescriptions Disp Refills  . ciclopirox (PENLAC) 8 % solution 6.6 mL 1    Sig: Apply topically at bedtime. Apply over nail QHS.  Apply  Daily over previous coat. After (7) days, may remove with alcohol and  continue cycle.    Return in about 4 months (around 05/01/2018).  Karle Plumber, MD, FACP

## 2018-01-02 LAB — CBC
HEMATOCRIT: 41.6 % (ref 34.0–46.6)
Hemoglobin: 14.3 g/dL (ref 11.1–15.9)
MCH: 29.9 pg (ref 26.6–33.0)
MCHC: 34.4 g/dL (ref 31.5–35.7)
MCV: 87 fL (ref 79–97)
Platelets: 305 10*3/uL (ref 150–379)
RBC: 4.79 x10E6/uL (ref 3.77–5.28)
RDW: 13.5 % (ref 12.3–15.4)
WBC: 9.2 10*3/uL (ref 3.4–10.8)

## 2018-01-02 LAB — LIPID PANEL
CHOL/HDL RATIO: 4.7 ratio — AB (ref 0.0–4.4)
Cholesterol, Total: 242 mg/dL — ABNORMAL HIGH (ref 100–199)
HDL: 52 mg/dL (ref >39)
TRIGLYCERIDES: 414 mg/dL — AB (ref 0–149)

## 2018-01-03 ENCOUNTER — Encounter: Payer: Self-pay | Admitting: Internal Medicine

## 2018-01-05 MED FILL — SPIRONOLACTONE 100 MG TAB: 100 | 90 days supply | Qty: 90 | Fill #2

## 2018-01-05 MED FILL — LISINOPRIL 10 MG TABS: 10 | 90 days supply | Qty: 90 | Fill #2

## 2018-01-05 MED FILL — AMLODIPINE BESYLATE 10 MG T: 10 | 90 days supply | Qty: 90 | Fill #2

## 2018-01-06 ENCOUNTER — Encounter: Payer: Self-pay | Admitting: Internal Medicine

## 2018-01-07 MED FILL — JULUCA 50-25 MG TAB: 50-25 | 30 days supply | Qty: 30 | Fill #3

## 2018-01-08 MED FILL — CICLOPIROX 8% SOLUTION: 8 | 20 days supply | Qty: 7 | Fill #0

## 2018-02-19 MED FILL — JULUCA 50-25 MG TAB: 50-25 | 30 days supply | Qty: 30 | Fill #4

## 2018-03-02 ENCOUNTER — Encounter: Payer: Self-pay | Admitting: Internal Medicine

## 2018-03-03 ENCOUNTER — Encounter: Payer: Self-pay | Admitting: Infectious Disease

## 2018-03-04 ENCOUNTER — Encounter: Payer: Self-pay | Admitting: *Deleted

## 2018-03-04 ENCOUNTER — Telehealth: Payer: Self-pay | Admitting: Behavioral Health

## 2018-03-04 ENCOUNTER — Ambulatory Visit: Payer: 59

## 2018-03-04 ENCOUNTER — Other Ambulatory Visit: Payer: 59

## 2018-03-04 ENCOUNTER — Encounter: Payer: Self-pay | Admitting: Infectious Disease

## 2018-03-04 NOTE — Telephone Encounter (Signed)
We are fine with that and signed and given to [facility]

## 2018-03-04 NOTE — Telephone Encounter (Signed)
Patient sent a My Chart message today stating she needs to have a MMR vaccine and Varicella vaccine in order to start nursing school.  She has found a doctors office that will administer the vaccine however she needs a letter stating it is ok for her to receive a live virus. Pricilla Riffle RN

## 2018-03-08 ENCOUNTER — Other Ambulatory Visit: Payer: 59

## 2018-03-08 ENCOUNTER — Ambulatory Visit: Payer: 59

## 2018-03-11 DIAGNOSIS — Z23 Encounter for immunization: Secondary | ICD-10-CM | POA: Diagnosis not present

## 2018-03-15 ENCOUNTER — Ambulatory Visit: Payer: 59 | Attending: Internal Medicine | Admitting: *Deleted

## 2018-03-15 DIAGNOSIS — Z111 Encounter for screening for respiratory tuberculosis: Secondary | ICD-10-CM | POA: Diagnosis not present

## 2018-03-15 MED FILL — JULUCA 50-25 MG TAB: 50-25 | 30 days supply | Qty: 30 | Fill #5

## 2018-03-15 NOTE — Progress Notes (Signed)
PPD Placement note Wanda Kane, 57 y.o. female is here today for placement of PPD test Reason for PPD test: school Pt taken PPD test before: yes Verified in allergy area and with patient that they are not allergic to the products PPD is made of (Phenol or Tween). Yes Is patient taking any oral or IV steroid medication now or have they taken it in the last month? no Has the patient ever received the BCG vaccine?: no Has the patient been in recent contact with anyone known or suspected of having active TB disease?: no   PPD placed on 03/15/2018.  Patient advised to return for reading within 48-72 hours.

## 2018-03-17 ENCOUNTER — Ambulatory Visit: Payer: 59 | Attending: Family Medicine | Admitting: *Deleted

## 2018-03-17 DIAGNOSIS — Z111 Encounter for screening for respiratory tuberculosis: Secondary | ICD-10-CM | POA: Diagnosis not present

## 2018-03-17 LAB — TB SKIN TEST
INDURATION: 0 mm
TB Skin Test: NEGATIVE

## 2018-03-17 NOTE — Progress Notes (Signed)
Pt here for PPD reading.

## 2018-03-23 ENCOUNTER — Ambulatory Visit: Payer: 59

## 2018-03-23 ENCOUNTER — Encounter (INDEPENDENT_AMBULATORY_CARE_PROVIDER_SITE_OTHER): Payer: 59 | Admitting: *Deleted

## 2018-03-23 VITALS — BP 127/84 | HR 80 | Temp 98.1°F | Wt 226.5 lb

## 2018-03-23 DIAGNOSIS — Z006 Encounter for examination for normal comparison and control in clinical research program: Secondary | ICD-10-CM | POA: Diagnosis not present

## 2018-03-23 NOTE — Progress Notes (Signed)
Wanda Kane is here for her month 28 visit for [employer]. She denies any new problems. 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 She denies any muscle aches or weakness or adherence problems. She is due to return in Sept.

## 2018-03-24 LAB — PREGNANCY, URINE: Preg Test, Ur: NEGATIVE

## 2018-03-29 ENCOUNTER — Ambulatory Visit: Payer: 59

## 2018-03-29 ENCOUNTER — Ambulatory Visit: Payer: 59 | Attending: Family Medicine | Admitting: *Deleted

## 2018-03-29 DIAGNOSIS — Z111 Encounter for screening for respiratory tuberculosis: Secondary | ICD-10-CM | POA: Insufficient documentation

## 2018-03-29 NOTE — Progress Notes (Signed)
PPD Placement note Wanda Kane, 57 y.o. female is here today for placement of PPD test Reason for PPD test: school Pt taken PPD test before: yes Verified in allergy area and with patient that they are not allergic to the products PPD is made of (Phenol or Tween). No:  Is patient taking any oral or IV steroid medication now or have they taken it in the last month? no Has the patient ever received the BCG vaccine?: no Has the patient been in recent contact with anyone known or suspected of having active TB disease?: no       P:  PPD placed on 03/29/2018.  Patient advised to return for reading within 48-72 hours.

## 2018-04-01 ENCOUNTER — Ambulatory Visit: Payer: 59 | Attending: Internal Medicine

## 2018-04-01 DIAGNOSIS — Z111 Encounter for screening for respiratory tuberculosis: Secondary | ICD-10-CM | POA: Insufficient documentation

## 2018-04-01 LAB — TB SKIN TEST
INDURATION: 0 mm
TB Skin Test: NEGATIVE

## 2018-04-01 NOTE — Progress Notes (Signed)
Patient here today to have PPD site read.   PPD read and results entered in EpicCare. Result: 0 mm induration. Interpretation: Negative Jay'A R Rylynn Schoneman, RMA   

## 2018-04-15 ENCOUNTER — Other Ambulatory Visit: Payer: Self-pay | Admitting: Internal Medicine

## 2018-04-16 ENCOUNTER — Encounter: Payer: Self-pay | Admitting: Internal Medicine

## 2018-04-16 ENCOUNTER — Ambulatory Visit: Payer: 59 | Attending: Internal Medicine | Admitting: Internal Medicine

## 2018-04-16 VITALS — BP 130/85 | HR 77 | Temp 98.7°F | Resp 16 | Ht 64.0 in | Wt 228.2 lb

## 2018-04-16 DIAGNOSIS — Z8249 Family history of ischemic heart disease and other diseases of the circulatory system: Secondary | ICD-10-CM | POA: Insufficient documentation

## 2018-04-16 DIAGNOSIS — Z79899 Other long term (current) drug therapy: Secondary | ICD-10-CM | POA: Diagnosis not present

## 2018-04-16 DIAGNOSIS — Z020 Encounter for examination for admission to educational institution: Secondary | ICD-10-CM | POA: Diagnosis not present

## 2018-04-16 DIAGNOSIS — I10 Essential (primary) hypertension: Secondary | ICD-10-CM | POA: Insufficient documentation

## 2018-04-16 DIAGNOSIS — Z888 Allergy status to other drugs, medicaments and biological substances status: Secondary | ICD-10-CM | POA: Insufficient documentation

## 2018-04-16 DIAGNOSIS — E785 Hyperlipidemia, unspecified: Secondary | ICD-10-CM | POA: Insufficient documentation

## 2018-04-16 DIAGNOSIS — Z86718 Personal history of other venous thrombosis and embolism: Secondary | ICD-10-CM | POA: Insufficient documentation

## 2018-04-16 DIAGNOSIS — Z021 Encounter for pre-employment examination: Secondary | ICD-10-CM | POA: Insufficient documentation

## 2018-04-16 DIAGNOSIS — Z21 Asymptomatic human immunodeficiency virus [HIV] infection status: Secondary | ICD-10-CM | POA: Diagnosis not present

## 2018-04-16 NOTE — Progress Notes (Signed)
Patient ID: Wanda Kane, female    DOB: 1961-02-07  MRN: [id number]  CC: Employment Physical (school)   Subjective: Wanda Kane is a 57 y.o. female who presents for chronic ds management and school physical Her concerns today include:  57 yr old AAF with hx ofHIV(followed by ID with undetected viral load), HTN, fibroid, HL, recent pap with pos HPV, DVT with filter.   The patient has been accepted into the nursing program at [employer].  She brings form today for school physical. -She is up-to-date with required vaccinations.  Received a varicella and MMR vaccines last month but she later  discovered that she had adequate titers work physical back in 2017.  Recent TB skin test was negative. -she has hx of HIV, on treatment with undetectable viral load -compliant with BP med -still in blinded research study for cholesterol med.  Plans to drop out now that she will be starting school. 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 Nail fungus 01/01/2018  . Genital herpes 08/12/2017  . Positive fecal immunochemical test 06/29/2017  . Pap smear of cervix shows high risk HPV present 06/22/2017  . Hypertension 12/29/2014  . Perimenopause 10/26/2014  . Metabolic syndrome 66/59/9357  . Cutaneous skin tags 10/26/2014  . Back pain 05/16/2014  . Anemia 10/12/2013  . Myalgia 02/09/2013  . Condyloma acuminatum 10/27/2012  . Acquired acanthosis nigricans 10/27/2012  . Uterine fibroid 10/27/2012  . 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 HIV (human immunodeficiency virus infection) ([place]) 10/18/2012  . Dyslipidemia 10/18/2012  . Pre-diabetes 10/18/2012     Current Outpatient Medications on File Prior to Visit  Medication Sig Dispense Refill  . amLODipine (NORVASC) 10 MG tablet Take 1 tablet (10 mg total) by mouth daily. 90 tablet 3  . B-Complex-C TABS Take 1 tablet by  mouth daily.    . ciclopirox (PENLAC) 8 % solution Apply topically at bedtime. Apply over nail QHS.  Apply  Daily over previous coat. After (7) days, may remove with alcohol and continue cycle. 6.6 mL 1  . Dolutegravir-Rilpivirine (JULUCA) 50-25 MG TABS Take 1 tablet by mouth daily. Lamivudine may be DC'D 30 tablet 11  . lisinopril (ZESTRIL) 10 MG tablet Take 1 tablet (10 mg total) by mouth daily. 90 tablet 3  . Multiple Vitamin (MULTIVITAMIN) capsule Take 1 capsule by mouth daily.    Marland Kitchen spironolactone (ALDACTONE) 100 MG tablet Take 1 tablet (100 mg total) by mouth daily. 90 tablet 3  . valACYclovir (VALTREX) 1000 MG tablet Take 1 tablet (1,000 mg total) by mouth as directed. Take one tablet BID x 7 days then one tablet daily (Patient not taking: Reported on 11/18/2017) 34 tablet 11   No current facility-administered medications on file prior to visit.     Allergies  Allergen Reactions  . Pepcid [Famotidine] Other (See Comments)  . Prilosec [Omeprazole] Other (See Comments)  . Viread [Tenofovir Disoproxil] Other (See Comments)    Rhabdomyolysis    Social History   Socioeconomic History  . Marital status: Single    Spouse name: Not on file  . Number of children: Not on file  . Years of education: college, [place]  . Highest education level: Not on file  Occupational History  . Occupation: Research scientist (life sciences): [employer]  Social Needs  . Financial resource strain: Not on file  .  Food insecurity:    Worry: Not on file    Inability: Not on file  . Transportation needs:    Medical: Not on file    Non-medical: Not on file  Tobacco Use  . Smoking status: Never Smoker  . Smokeless tobacco: Never Used  Substance and Sexual Activity  . Alcohol use: No  . Drug use: No  . Sexual activity: Not Currently    Partners: Male    Comment: patient declined condoms  Lifestyle  . Physical activity:    Days per week: Not on file    Minutes per session: Not on file  . Stress: Not on file    Relationships  . Social connections:    Talks on phone: Not on file    Gets together: Not on file    Attends religious service: Not on file    Active member of club or organization: Not on file    Attends meetings of clubs or organizations: Not on file    Relationship status: Not on file  . Intimate partner violence:    Fear of current or ex partner: Not on file    Emotionally abused: Not on file    Physically abused: Not on file    Forced sexual activity: Not on file  Other Topics Concern  . 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   She loves her job   Has BSW   Working towards Research officer, political party    Family History  Problem Relation Age of Onset  . Hypertension Mother   . CAD Brother   . Heart disease Brother 22       cardiac arrest    Past Surgical History:  Procedure Laterality Date  . CESAREAN SECTION  1983  . CESAREAN SECTION    . TONSILLECTOMY AND ADENOIDECTOMY      ROS: Review of Systems  Constitutional: Negative for activity change and fever.  Eyes: Negative for visual disturbance.       Wears rxn lenses.  Last eye exam was 11/2017.  Respiratory: Negative for cough and shortness of breath.   Cardiovascular: Negative for chest pain and leg swelling.  Gastrointestinal: Negative for abdominal pain.  Genitourinary: Negative.   Musculoskeletal: Negative for arthralgias.  Psychiatric/Behavioral: Negative for dysphoric mood. The patient is not nervous/anxious.     PHYSICAL EXAM: BP 130/85   Pulse 77   Temp 98.7 F (37.1 C) (Oral)   Resp 16   Ht 5' 4"  (1.626 m)   Wt 228 lb 3.2 oz (103.5 kg)   LMP 05/31/2016 (Approximate)   SpO2 95%   BMI 39.17 kg/m   Physical Exam  General appearance - alert, well appearing, middle age AAF and in no distress Mental status - normal mood, behavior, speech, dress, motor activity, and thought processes Eyes - pupils equal and reactive, extraocular eye movements intact.  Wearing rxn  glasses Ears - bilateral TM's and external ear canals normal Nose - normal and patent, no erythema, discharge or polyps Mouth - mucous membranes moist, pharynx normal without lesions Neck - supple, no significant adenopathy Lymphatics - no palpable lymphadenopathy, no hepatosplenomegaly Chest - clear to auscultation, no wheezes, rales or rhonchi, symmetric air entry Heart - normal rate, regular rhythm, normal S1, S2, no murmurs, rubs, clicks or gallops Abdomen - soft, nontender, nondistended, no masses or organomegaly Musculoskeletal - no signs of active inflammation, hands, wrist, elbows.  Gait normal Extremities - peripheral  pulses normal, no pedal edema, no clubbing or cyanosis  ASSESSMENT AND PLAN: 1. School physical exam -Form completed and given to pt today.  She is up to date with required vaccines including hep B series. -no physical limitations needed Copy of completed form to be scanned into EMR  Patient was given the opportunity to ask questions.  Patient verbalized understanding of the plan and was able to repeat key elements of the plan.   No orders of the defined types were placed in this encounter.    Requested Prescriptions    No prescriptions requested or ordered in this encounter    No follow-ups on file.  Karle Plumber, MD, FACP

## 2018-04-19 MED FILL — AMLODIPINE BESYLATE 10 MG T: 10 | 90 days supply | Qty: 90 | Fill #3

## 2018-04-19 MED FILL — JULUCA 50-25 MG TAB: 50-25 | 30 days supply | Qty: 30 | Fill #6

## 2018-04-19 MED FILL — SPIRONOLACTONE 100 MG TAB: 100 | 90 days supply | Qty: 90 | Fill #3

## 2018-04-19 MED FILL — LISINOPRIL 10 MG TABS: 10 | 90 days supply | Qty: 90 | Fill #3

## 2018-06-01 ENCOUNTER — Other Ambulatory Visit: Payer: Self-pay | Admitting: Internal Medicine

## 2018-06-01 DIAGNOSIS — Z1231 Encounter for screening mammogram for malignant neoplasm of breast: Secondary | ICD-10-CM

## 2018-06-10 MED FILL — JULUCA 50-25 MG TAB: 50-25 | 30 days supply | Qty: 30 | Fill #7

## 2018-06-21 ENCOUNTER — Ambulatory Visit: Payer: 59 | Admitting: Internal Medicine

## 2018-06-25 ENCOUNTER — Ambulatory Visit
Admission: RE | Admit: 2018-06-25 | Discharge: 2018-06-25 | Disposition: A | Discharge status: Home / Self Care | Payer: 59 | Source: Ambulatory Visit | Attending: Internal Medicine | Admitting: Internal Medicine

## 2018-06-25 DIAGNOSIS — Z1231 Encounter for screening mammogram for malignant neoplasm of breast: Secondary | ICD-10-CM | POA: Diagnosis not present

## 2018-07-02 MED FILL — JULUCA 50-25 MG TAB: 50-25 | 30 days supply | Qty: 30 | Fill #8

## 2018-07-22 ENCOUNTER — Encounter: Payer: 59 | Admitting: *Deleted

## 2018-07-22 ENCOUNTER — Ambulatory Visit: Payer: 59 | Admitting: Internal Medicine

## 2018-07-23 ENCOUNTER — Telehealth: Payer: Self-pay | Admitting: Internal Medicine

## 2018-07-23 NOTE — Telephone Encounter (Signed)
Elizabeth from [facility] called requesting a couple of medication refills if you could please follow up with the pharmacy.

## 2018-07-26 MED FILL — JULUCA 50-25 MG TAB: 50-25 | 30 days supply | Qty: 30 | Fill #9

## 2018-07-28 ENCOUNTER — Telehealth: Payer: Self-pay | Admitting: Internal Medicine

## 2018-07-28 ENCOUNTER — Encounter: Payer: Self-pay | Admitting: Internal Medicine

## 2018-07-28 ENCOUNTER — Other Ambulatory Visit: Payer: Self-pay

## 2018-07-28 DIAGNOSIS — I10 Essential (primary) hypertension: Secondary | ICD-10-CM

## 2018-07-28 MED ORDER — LISINOPRIL 10 MG PO TABS: 10.0000 mg | ORAL_TABLET | Freq: Every day | ORAL | 0 refills | Status: DC

## 2018-07-28 MED ORDER — SPIRONOLACTONE 100 MG PO TABS: 100.0000 mg | ORAL_TABLET | Freq: Every day | ORAL | 0 refills | Status: DC

## 2018-07-28 MED ORDER — AMLODIPINE BESYLATE 10 MG PO TABS: 10.0000 mg | ORAL_TABLET | Freq: Every day | ORAL | 0 refills | Status: DC

## 2018-07-28 MED FILL — SPIRONOLACTONE 100 MG TAB: 100 | 90 days supply | Qty: 90 | Fill #0

## 2018-07-28 MED FILL — LISINOPRIL 10 MG TABLET: 10 | 90 days supply | Qty: 90 | Fill #0

## 2018-07-28 MED FILL — AMLODIPINE BESYLATE 10 MG T: 10 | 90 days supply | Qty: 90 | Fill #0

## 2018-07-28 NOTE — Telephone Encounter (Signed)
Pt came in to request her medication refilled -amLODipine (NORVASC) 10 MG tablet  -lisinopril (ZESTRIL) 10 MG tablet  -spironolactone (ALDACTONE) 100 MG tablet  To -[facility] Please follow up

## 2018-07-29 ENCOUNTER — Encounter (INDEPENDENT_AMBULATORY_CARE_PROVIDER_SITE_OTHER): Payer: Self-pay | Admitting: *Deleted

## 2018-07-29 VITALS — Wt 237.2 lb

## 2018-07-29 DIAGNOSIS — Z006 Encounter for examination for normal comparison and control in clinical research program: Secondary | ICD-10-CM

## 2018-07-29 NOTE — Progress Notes (Signed)
Wanda Kane is here for her month 32 Reprieve visit. No new complaints or concerns. Verbalized excellent adherence with her medications. Denied any muscle aches or weakness. 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 She's very excited and states that everything is going well. She will return in December for her next study visit.

## 2018-08-17 ENCOUNTER — Other Ambulatory Visit: Payer: Self-pay | Admitting: Infectious Disease

## 2018-08-17 DIAGNOSIS — B20 Human immunodeficiency virus [HIV] disease: Secondary | ICD-10-CM

## 2018-08-20 MED FILL — JULUCA 50-25 MG TAB: 50-25 | 30 days supply | Qty: 30 | Fill #0

## 2018-08-31 ENCOUNTER — Ambulatory Visit: Payer: 59 | Admitting: Internal Medicine

## 2018-09-17 MED FILL — JULUCA 50-25 MG TAB: 50-25 | 30 days supply | Qty: 30 | Fill #1

## 2018-10-11 MED FILL — JULUCA 50-25 MG TAB: 50-25 | 30 days supply | Qty: 30 | Fill #2

## 2018-11-04 ENCOUNTER — Ambulatory Visit: Payer: 59 | Admitting: Internal Medicine

## 2018-11-04 MED FILL — JULUCA 50-25 MG TAB: 50-25 | 30 days supply | Qty: 30 | Fill #3

## 2018-11-15 ENCOUNTER — Encounter: Payer: Self-pay | Admitting: Internal Medicine

## 2018-11-15 ENCOUNTER — Ambulatory Visit: Payer: 59 | Attending: Internal Medicine | Admitting: Internal Medicine

## 2018-11-15 VITALS — BP 138/84 | HR 95 | Temp 98.7°F | Resp 16 | Wt 241.2 lb

## 2018-11-15 DIAGNOSIS — Z86718 Personal history of other venous thrombosis and embolism: Secondary | ICD-10-CM | POA: Insufficient documentation

## 2018-11-15 DIAGNOSIS — Z6841 Body Mass Index (BMI) 40.0 and over, adult: Secondary | ICD-10-CM | POA: Insufficient documentation

## 2018-11-15 DIAGNOSIS — J069 Acute upper respiratory infection, unspecified: Secondary | ICD-10-CM | POA: Insufficient documentation

## 2018-11-15 DIAGNOSIS — I10 Essential (primary) hypertension: Secondary | ICD-10-CM | POA: Diagnosis not present

## 2018-11-15 DIAGNOSIS — Z79899 Other long term (current) drug therapy: Secondary | ICD-10-CM | POA: Insufficient documentation

## 2018-11-15 DIAGNOSIS — B351 Tinea unguium: Secondary | ICD-10-CM | POA: Diagnosis not present

## 2018-11-15 DIAGNOSIS — Z21 Asymptomatic human immunodeficiency virus [HIV] infection status: Secondary | ICD-10-CM | POA: Insufficient documentation

## 2018-11-15 DIAGNOSIS — E782 Mixed hyperlipidemia: Secondary | ICD-10-CM | POA: Diagnosis not present

## 2018-11-15 DIAGNOSIS — Z888 Allergy status to other drugs, medicaments and biological substances status: Secondary | ICD-10-CM | POA: Insufficient documentation

## 2018-11-15 DIAGNOSIS — Z8249 Family history of ischemic heart disease and other diseases of the circulatory system: Secondary | ICD-10-CM | POA: Insufficient documentation

## 2018-11-15 MED ORDER — TERBINAFINE HCL 250 MG PO TABS: 250.0000 mg | ORAL_TABLET | Freq: Every day | ORAL | 1 refills | Status: DC

## 2018-11-15 NOTE — Progress Notes (Signed)
Patient ID: Wanda Kane, female    DOB: 06/23/1961  MRN: [id number]  CC: Hypertension   Subjective: Wanda Kane is a 57 y.o. female who presents for chronic ds managment Her concerns today include:  57 yr old AAF with hx ofHIV(followed by ID with undetected viral load), HTN, fibroid, HL, recent pap with pos HPV, DVT with filter.  Obesity:  Gain 13 lbs since May 2019.  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  She has been eating on the go.  She wonders whether an appetite suppressant will help Goes to movies and go for massages to destress  HTN:  Compliant with meds Checks blood pressure about once a month.  Gives range of BP 130/low 80.  No chest pains or shortness of breath.  HL:  Still in a blinded study.  Taking Niacin over-the-counter because she has read that it can help with cholesterol.  Will have appt with them coordinators of the research study that she is in.  She has been in the study for 2 years.  The study has been extended for some additional years.  She plans to exit study on her follow-up with them later this week.   Onychomycosis: She wants to try oral medication for the fungus on the toenails of the big toes.  Was prescribed Penlac but states that she has not been consistent with using it.  She thinks that she is getting over the flu.  Reports symptoms of cough productive of yellow phlegm, sore throat and headache that started 5 days ago.  Symptoms are now resolving.  She has not had any fever or aches/pain.   Patient Active Problem List   Diagnosis Date Noted  . Nail fungus 01/01/2018  . Genital herpes 08/12/2017  . Positive fecal immunochemical test 06/29/2017  . Pap smear of cervix shows high risk HPV present 06/22/2017  . Hypertension 12/29/2014  . Perimenopause 10/26/2014  . Metabolic syndrome 14/97/0263  .  Cutaneous skin tags 10/26/2014  . Back pain 05/16/2014  . Anemia 10/12/2013  . Myalgia 02/09/2013  . Condyloma acuminatum 10/27/2012  . Acquired acanthosis nigricans 10/27/2012  . Uterine fibroid 10/27/2012  . 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 HIV (human immunodeficiency virus infection) ([place]) 10/18/2012  . Dyslipidemia 10/18/2012  . Pre-diabetes 10/18/2012     Current Outpatient Medications on File Prior to Visit  Medication Sig Dispense Refill  . amLODipine (NORVASC) 10 MG tablet Take 1 tablet (10 mg total) by mouth daily. 90 tablet 0  . B-Complex-C TABS Take 1 tablet by mouth daily.    . ciclopirox (PENLAC) 8 % solution Apply topically at bedtime. Apply over nail QHS.  Apply  Daily over previous coat. After (7) days, may remove with alcohol and continue cycle. 6.6 mL 1  . JULUCA 50-25 MG TABS TAKE 1 TABLET BY MOUTH DAILY. 30 tablet 5  . lisinopril (ZESTRIL) 10 MG tablet Take 1 tablet (10 mg total) by mouth daily. (Patient not taking: Reported on 11/15/2018) 90 tablet 0  . Multiple Vitamin (MULTIVITAMIN) capsule Take 1 capsule by mouth daily.    Marland Kitchen spironolactone (ALDACTONE) 100 MG tablet Take 1 tablet (100 mg total) by mouth daily. 90 tablet 0  . valACYclovir (VALTREX) 1000 MG tablet Take 1 tablet (1,000 mg total) by mouth as directed. Take one tablet BID x 7  days then one tablet daily (Patient not taking: Reported on 11/18/2017) 34 tablet 11   No current facility-administered medications on file prior to visit.     Allergies  Allergen Reactions  . Pepcid [Famotidine] Other (See Comments)  . Prilosec [Omeprazole] Other (See Comments)  . Viread [Tenofovir Disoproxil] Other (See Comments)    Rhabdomyolysis    Social History   Socioeconomic History  . Marital status: Single    Spouse name: Not on file  . Number of children: Not on file  . Years of education: college, [place]  . Highest education level: Not on file  Occupational History  . Occupation: Development worker, community: [employer]  Social Needs  . Financial resource strain: Not on file  . Food insecurity:    Worry: Not on file    Inability: Not on file  . Transportation needs:    Medical: Not on file    Non-medical: Not on file  Tobacco Use  . Smoking status: Never Smoker  . Smokeless tobacco: Never Used  Substance and Sexual Activity  . Alcohol use: No  . Drug use: No  . Sexual activity: Not Currently    Partners: Male    Comment: patient declined condoms  Lifestyle  . Physical activity:    Days per week: Not on file    Minutes per session: Not on file  . Stress: Not on file  Relationships  . Social connections:    Talks on phone: Not on file    Gets together: Not on file    Attends religious service: Not on file    Active member of club or organization: Not on file    Attends meetings of clubs or organizations: Not on file    Relationship status: Not on file  . Intimate partner violence:    Fear of current or ex partner: Not on file    Emotionally abused: Not on file    Physically abused: Not on file    Forced sexual activity: Not on file  Other Topics Concern  . 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   She loves her job   Has BSW   Working towards Research officer, political party    Family History  Problem Relation Age of Onset  . Hypertension Mother   . CAD Brother   . Heart disease Brother 42       cardiac arrest    Past Surgical History:  Procedure Laterality Date  . CESAREAN SECTION  1983  . CESAREAN SECTION    . TONSILLECTOMY AND ADENOIDECTOMY      ROS: Review of Systems Negative except as above. PHYSICAL EXAM: BP 138/84   Pulse 95   Temp 98.7 F (37.1 C) (Oral)   Resp 16   Wt 241 lb 3.2 oz (109.4 kg)   LMP 05/31/2016 (Approximate)   SpO2 96%   BMI 41.40 kg/m   Wt Readings from Last 3 Encounters:  11/15/18 241 lb 3.2 oz (109.4 kg)  07/29/18 237 lb 4 oz (107.6 kg)  04/16/18 228 lb 3.2 oz (103.5 kg)   BP  128/88 Physical Exam  General appearance - alert, well appearing, obese middle-age African-American female and in no distress Mental status - normal mood, behavior, speech, dress, motor activity, and thought processes Mouth - mucous membranes moist, pharynx normal without lesions Neck - supple, no significant adenopathy Chest - clear to auscultation, no wheezes, rales or  rhonchi, symmetric air entry Heart - normal rate, regular rhythm, normal S1, S2, no murmurs, rubs, clicks or gallops Extremities -no lower extremity edema. Nails: Mild thickness and discoloration of the lateral aspect of both big toes  ASSESSMENT AND PLAN: 1. Essential hypertension Not at goal.  Patient will work on cutting back on fast foods.  She will continue her current medications.  Advised to check blood pressure at least twice a month with goal of 130/80 or lower - CBC; Future - Comprehensive metabolic panel; Future  2. Hyperlipidemia, mixed - Lipid panel; Future  3. Nail fungus Patient will discontinue Penlac.  Start Lamisil after I get the results back of the LFTs. I will send her the results via MyChart.  I went over possible side effects of Lamisil including drug-induced hepatitis.  Patient advised to stop the medication and come in immediately if she develops any jaundice, unexplained nausea/vomiting or right upper quadrant pain. Plan to keep her on the medication for 8 to 12 weeks.  Advised to come to the lab once a month for check of liver function tests while she is on the medication - terbinafine (LAMISIL) 250 MG tablet; Take 1 tablet (250 mg total) by mouth daily.  Dispense: 30 tablet; Refill: 1  4. Class 3 severe obesity due to excess calories without serious comorbidity with body mass index (BMI) of 40.0 to 44.9 in adult [facility]) Dietary counseling given.  Advised patient to be deliberate about preparing her own foods rather than grabbing fast food.  Since she lives alone she can cook about twice a week  and bulk.  We also discussed healthy snacks that she can take with her to class. Encourage some form of regular aerobic exercise several times a week.  5. Upper respiratory tract infection, unspecified type Patient is on the tail end of this.  No need for medication at this time.    Patient was given the opportunity to ask questions.  Patient verbalized understanding of the plan and was able to repeat key elements of the plan.   Orders Placed This Encounter  Procedures  . CBC  . Comprehensive metabolic panel  . Lipid panel     Requested Prescriptions   Signed Prescriptions Disp Refills  . terbinafine (LAMISIL) 250 MG tablet 30 tablet 1    Sig: Take 1 tablet (250 mg total) by mouth daily.    Return in about 3 months (around 02/14/2019).  Karle Plumber, MD, FACP

## 2018-11-15 NOTE — Patient Instructions (Signed)

## 2018-11-16 ENCOUNTER — Ambulatory Visit: Payer: 59 | Attending: Family Medicine

## 2018-11-16 DIAGNOSIS — Z6839 Body mass index (BMI) 39.0-39.9, adult: Secondary | ICD-10-CM | POA: Diagnosis not present

## 2018-11-16 DIAGNOSIS — E6609 Other obesity due to excess calories: Secondary | ICD-10-CM

## 2018-11-16 DIAGNOSIS — Z0000 Encounter for general adult medical examination without abnormal findings: Secondary | ICD-10-CM

## 2018-11-16 DIAGNOSIS — I10 Essential (primary) hypertension: Secondary | ICD-10-CM | POA: Diagnosis not present

## 2018-11-16 DIAGNOSIS — E782 Mixed hyperlipidemia: Secondary | ICD-10-CM

## 2018-11-16 DIAGNOSIS — E669 Obesity, unspecified: Secondary | ICD-10-CM | POA: Diagnosis not present

## 2018-11-16 MED FILL — TERBINAFINE HCL 250 MG TAB: 250 | 30 days supply | Qty: 30 | Fill #0

## 2018-11-17 LAB — COMPREHENSIVE METABOLIC PANEL
A/G RATIO: 1.7 (ref 1.2–2.2)
ALK PHOS: 117 IU/L (ref 39–117)
ALT: 23 IU/L (ref 0–32)
AST: 28 IU/L (ref 0–40)
Albumin: 4.5 g/dL (ref 3.5–5.5)
BILIRUBIN TOTAL: 0.5 mg/dL (ref 0.0–1.2)
BUN/Creatinine Ratio: 15 (ref 9–23)
BUN: 11 mg/dL (ref 6–24)
CO2: 20 mmol/L (ref 20–29)
Calcium: 9.8 mg/dL (ref 8.7–10.2)
Chloride: 102 mmol/L (ref 96–106)
Creatinine, Ser: 0.74 mg/dL (ref 0.57–1.00)
GFR calc Af Amer: 104 mL/min/{1.73_m2} (ref >59)
GFR calc non Af Amer: 90 mL/min/{1.73_m2} (ref >59)
GLOBULIN, TOTAL: 2.7 g/dL (ref 1.5–4.5)
Glucose: 172 mg/dL — ABNORMAL HIGH (ref 65–99)
POTASSIUM: 4.1 mmol/L (ref 3.5–5.2)
SODIUM: 144 mmol/L (ref 134–144)
Total Protein: 7.2 g/dL (ref 6.0–8.5)

## 2018-11-17 LAB — LIPID PANEL
CHOLESTEROL TOTAL: 253 mg/dL — AB (ref 100–199)
Chol/HDL Ratio: 4.6 ratio — ABNORMAL HIGH (ref 0.0–4.4)
HDL: 55 mg/dL (ref >39)
LDL CALC: 148 mg/dL — AB (ref 0–99)
TRIGLYCERIDES: 252 mg/dL — AB (ref 0–149)
VLDL CHOLESTEROL CAL: 50 mg/dL — AB (ref 5–40)

## 2018-11-17 LAB — CBC
Hematocrit: 41.6 % (ref 34.0–46.6)
Hemoglobin: 14 g/dL (ref 11.1–15.9)
MCH: 29.5 pg (ref 26.6–33.0)
MCHC: 33.7 g/dL (ref 31.5–35.7)
MCV: 88 fL (ref 79–97)
PLATELETS: 275 10*3/uL (ref 150–450)
RBC: 4.74 x10E6/uL (ref 3.77–5.28)
RDW: 12.5 % (ref 12.3–15.4)
WBC: 10.9 10*3/uL — AB (ref 3.4–10.8)

## 2018-11-18 NOTE — Addendum Note (Signed)
Addended by: Karle Plumber B on: 11/18/2018 07:54 AM   Modules accepted: Orders

## 2018-11-19 ENCOUNTER — Ambulatory Visit: Payer: 59 | Admitting: Internal Medicine

## 2018-11-19 ENCOUNTER — Encounter (INDEPENDENT_AMBULATORY_CARE_PROVIDER_SITE_OTHER): Payer: Self-pay | Admitting: *Deleted

## 2018-11-19 VITALS — BP 128/85 | HR 83 | Temp 97.7°F | Wt 237.8 lb

## 2018-11-19 DIAGNOSIS — Z006 Encounter for examination for normal comparison and control in clinical research program: Secondary | ICD-10-CM

## 2018-11-19 LAB — HEMOGLOBIN A1C
Est. average glucose Bld gHb Est-mCnc: 186 mg/dL
HEMOGLOBIN A1C: 8.1 % — AB (ref 4.8–5.6)

## 2018-11-19 LAB — SPECIMEN STATUS REPORT

## 2018-11-19 NOTE — Research (Signed)
Wanda Kane is here for her month 39 Reprieve visit. 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 She is requesting to come off study at this time due to school requirements and health issues. States that her PCP has been pushing to start her on a statin medication because of here elevated cholesterol. She states that they have been holding off on this due to the study. She verbalized concerned because her HgBA1c was elevated last week. Currently not on any medication. She is hoping that she can improve both her cholesterol and glucose with diet and exercise. She will follow up with her PCP next month.

## 2018-11-23 ENCOUNTER — Ambulatory Visit (INDEPENDENT_AMBULATORY_CARE_PROVIDER_SITE_OTHER): Payer: 59 | Admitting: Licensed Clinical Social Worker

## 2018-11-23 DIAGNOSIS — F4322 Adjustment disorder with anxiety: Secondary | ICD-10-CM | POA: Diagnosis not present

## 2018-11-23 NOTE — Progress Notes (Signed)
Integrated Behavioral Health Comprehensive Clinical Assessment  MRN: [id number] Name: Wanda Kane  Session Time: 9:00 - 9:40am Total time: 40 minutes  Type of Service: Integrated Behavioral Health-Individual Interpretor: No. Interpretor Name and Language: n/a  PRESENTING CONCERNS: Wanda Kane is a 58 y.o. female accompanied by self. Wanda Kane   self-referred to [employer] clinician for stress/adjustment concerns  Previous mental health services Have you ever been treated for a mental health problem? Yes If "Yes", when were you treated and whom did you see? [facility], Wanda Kane   Have you ever been hospitalized for mental health treatment? No Have you ever been treated for any of the following? Past Psychiatric History/Hospitalization(s): Anxiety: No Bipolar Disorder: No Depression: No Mania: No Psychosis: No Schizophrenia: No Personality Disorder: No Hospitalization for psychiatric illness: No History of Electroconvulsive Shock Therapy: No Prior Suicide Attempts: No Have you ever had thoughts of harming yourself or others or attempted suicide? No plan to harm self or others  Medical history  has a past medical history of DVT (deep venous thrombosis) ([name]) (Jan 2016), Fibroids (2010), Genital herpes (08/12/2017), HIV infection ([place]) (1994), Hypertension (1994), and Vaginal bleeding (12/2014). Primary Care Physician: Wanda Pier, MD  Allergies:  Allergies  Allergen Reactions  . Pepcid [Famotidine] Other (See Comments)  . Prilosec [Omeprazole] Other (See Comments)  . Viread [Tenofovir Disoproxil] Other (See Comments)    Rhabdomyolysis   Current medications:  Outpatient Encounter Medications as of 11/23/2018  Medication Sig  . amLODipine (NORVASC) 10 MG tablet Take 1 tablet (10 mg total) by mouth daily.  Marland Kitchen B-Complex-C TABS Take 1 tablet by mouth daily.  . ciclopirox (PENLAC) 8 % solution Apply topically at bedtime. Apply over nail QHS.  Apply  Daily over  previous coat. After (7) days, may remove with alcohol and continue cycle.  . JULUCA 50-25 MG TABS TAKE 1 TABLET BY MOUTH DAILY.  Marland Kitchen lisinopril (ZESTRIL) 10 MG tablet Take 1 tablet (10 mg total) by mouth daily. (Patient not taking: Reported on 11/15/2018)  . Multiple Vitamin (MULTIVITAMIN) capsule Take 1 capsule by mouth daily.  Marland Kitchen spironolactone (ALDACTONE) 100 MG tablet Take 1 tablet (100 mg total) by mouth daily.  Marland Kitchen terbinafine (LAMISIL) 250 MG tablet Take 1 tablet (250 mg total) by mouth daily.  . valACYclovir (VALTREX) 1000 MG tablet Take 1 tablet (1,000 mg total) by mouth as directed. Take one tablet BID x 7 days then one tablet daily (Patient not taking: Reported on 11/18/2017)   No facility-administered encounter medications on file as of 11/23/2018.    Have you ever had any serious medication reactions? No Is there any history of mental health problems or substance abuse in your family? No Has anyone in your family been hospitalized for mental health treatment? No  Social/family history Who lives in your current household? Patient lives alone. She has a 57 year old son who lives in [place]. What is your family of origin, childhood history? Father was in the [employer] and patient grew up in [place] with both mom and dad, until dad retired to [place].  Describe your childhood: "pretty good", family was intact, patient was mostly happy and has good relationships with family members, though she was/is closer with father than with mother Do you have siblings, step/half siblings? Yes- one brother, still in [place] Are your parents separated or divorced? No What are your social supports? Family is supportive but not close by, has a few close friends but none are local  Education How many  grades have you completed? Earned a BSW from [employer] in 2009, currently in LPN program at [employer] with plans to graduate in June, take 6 months off to work, and then enter the [employer] to [employer].  Did you have any problems in school? 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 No real financial concerns, though she reports all she does is work and go to school and would like to have more balance in her life.  Sleep No sleep problems. A bit hard adjusting when she first moved to night shift after having classes during the day, but sleeps well - not too much and not too little.   Trauma/Abuse history Have you ever experienced or been exposed to any form of abuse? No Have you ever experienced or been exposed to something traumatic? No  Substance use Do you use alcohol, nicotine or caffeine? Caffeine and some social drinking   How old were you when you first tasted alcohol? Unknown Have you ever used illicit drugs or abused prescription medications? none  Mental status General appearance/Behavior: Casual Eye contact: Good Motor behavior: Normal  Speech: Normal Level of consciousness: Alert Mood: Anxious Affect: Appropriate Anxiety level: Minimal Thought process: Coherent and Relevant Thought content: WNL Perception: Normal Judgment: Fair Insight: Present  Diagnosis   ICD-10-CM   1. Adjustment disorder with anxious mood F43.22     GOALS ADDRESSED: Patient will  Increase healthy adjustment to current life circumstances              INTERVENTIONS: Interventions utilized: Motivational Interviewing and Supportive Counseling    ASSESSMENT/OUTCOME: Patient has struggled to adjust to situations with nursing school and physical health changes. She reports some stress/anxiety and a feeling of being overwhelmed. Also, trouble concentrating and tendency to isolate. The most appropriate diagnosis at this time is Adjustment Disorder with Anxiety. Counselor will continue to assess for other, underlying issues of anxiety.   Patient reports that her life revolves  around school at this time, and she has been experiencing considerable stress in that area. She also works nights as a Quarry manager and had a bit of trouble adjusting to the change in schedule. Counselor processed with patient her stressors and guided her to identify what she would like to see happen differently for her. Patient identified that she wants to feel better/less overwhelmed and to have more balance in her life. Counselor and patient explored what this would look like if/when she is able to achieve it. Patient has begun a new diet change after recent diabetes diagnosis. She would like to build connections and support, and maybe start dating again. Counselor explored with patient what she would need to be able to do these things. Patient acknowledged that she needs to put effort into these areas and have a more open mind about where she might find support and connection. She will start a new semester at school next week and would like to just check in every couple of months for support in adjusting to circumstances.   PLAN: Patient will return in 2 months. She agrees to contact  Counselor if follow-up is needed before that time.   Scheduled next visit: 02/01/19  Paula Compton Counselor

## 2018-11-25 ENCOUNTER — Other Ambulatory Visit: Payer: Self-pay | Admitting: Internal Medicine

## 2018-11-25 DIAGNOSIS — I10 Essential (primary) hypertension: Secondary | ICD-10-CM

## 2018-12-02 MED FILL — LISINOPRIL 10 MG TABLET: 10 | 90 days supply | Qty: 90 | Fill #0

## 2018-12-02 MED FILL — AMLODIPINE BESYLATE 10 MG T: 10 | 90 days supply | Qty: 90 | Fill #0

## 2018-12-02 MED FILL — SPIRONOLACTONE 100 MG TAB: 100 | 90 days supply | Qty: 90 | Fill #0

## 2018-12-02 MED FILL — JULUCA 50-25 MG TAB: 50-25 | 30 days supply | Qty: 30 | Fill #4

## 2018-12-16 ENCOUNTER — Ambulatory Visit: Payer: 59 | Admitting: Internal Medicine

## 2018-12-21 ENCOUNTER — Encounter: Payer: Self-pay | Admitting: Internal Medicine

## 2018-12-21 ENCOUNTER — Ambulatory Visit: Payer: Self-pay | Attending: Internal Medicine | Admitting: Internal Medicine

## 2018-12-21 VITALS — BP 125/84 | HR 81 | Temp 98.3°F | Resp 16 | Wt 231.4 lb

## 2018-12-21 DIAGNOSIS — E119 Type 2 diabetes mellitus without complications: Secondary | ICD-10-CM

## 2018-12-21 DIAGNOSIS — Z6839 Body mass index (BMI) 39.0-39.9, adult: Secondary | ICD-10-CM

## 2018-12-21 DIAGNOSIS — R195 Other fecal abnormalities: Secondary | ICD-10-CM

## 2018-12-21 DIAGNOSIS — E782 Mixed hyperlipidemia: Secondary | ICD-10-CM

## 2018-12-21 LAB — GLUCOSE, POCT (MANUAL RESULT ENTRY): POC Glucose: 133 mg/dl — AB (ref 70–99)

## 2018-12-21 MED ORDER — TRUEPLUS LANCETS 28G MISC: 12 refills | Status: DC

## 2018-12-21 MED ORDER — GLUCOSE BLOOD VI STRP: ORAL_STRIP | 12 refills | Status: DC

## 2018-12-21 MED ORDER — TRUE METRIX METER W/DEVICE KIT: PACK | 0 refills | Status: DC

## 2018-12-21 MED FILL — TERBINAFINE HCL 250 MG TAB: 250 | 30 days supply | Qty: 30 | Fill #0

## 2018-12-21 NOTE — Patient Instructions (Signed)
I will send a prescription to your pharmacy for diabetes testing supplies.  Try to check your blood sugars at least once a day some days before breakfast and other days before dinner.  The goal for blood sugars before meals is 90-130.  If you check 2 hours after meal the goal is less than 180. Please schedule an eye exam with your optometrist as soon as possible and inform him/her of the new diagnosis of diabetes.   Diabetes Mellitus and Standards of Medical Care Managing diabetes (diabetes mellitus) can be complicated. Your diabetes treatment may be managed by a team of health care providers, including:  A physician who specializes in diabetes (endocrinologist).  A nurse practitioner or physician assistant.  Nurses.  A diet and nutrition specialist (registered dietitian).  A certified diabetes educator (CDE).  An exercise specialist.  A pharmacist.  An eye doctor.  A foot specialist (podiatrist).  A dentist.  A primary care provider.  A mental health provider. Your health care providers follow guidelines to help you get the best quality of care. The following schedule is a general guideline for your diabetes management plan. Your health care providers may give you more specific instructions. Physical exams Upon being diagnosed with diabetes mellitus, and each year after that, your health care provider will ask about your medical and family history. He or she will also do a physical exam. Your exam may include:  Measuring your height, weight, and body mass index (BMI).  Checking your blood pressure. This will be done at every routine medical visit. Your target blood pressure may vary depending on your medical conditions, your age, and other factors.  Thyroid gland exam.  Skin exam.  Screening for damage to your nerves (peripheral neuropathy). This may include checking the pulse in your legs and feet and checking the level of sensation in your hands and feet.  A complete  foot exam to inspect the structure and skin of your feet, including checking for cuts, bruises, redness, blisters, sores, or other problems.  Screening for blood vessel (vascular) problems, which may include checking the pulse in your legs and feet and checking your temperature. Blood tests Depending on your treatment plan and your personal needs, you may have the following tests done:  HbA1c (hemoglobin A1c). This test provides information about blood sugar (glucose) control over the previous 2-3 months. It is used to adjust your treatment plan, if needed. This test will be done: ? At least 2 times a year, if you are meeting your treatment goals. ? 4 times a year, if you are not meeting your treatment goals or if treatment goals have changed.  Lipid testing, including total, LDL, and HDL cholesterol and triglyceride levels. ? The goal for LDL is less than 100 mg/dL (5.5 mmol/L). If you are at high risk for complications, the goal is less than 70 mg/dL (3.9 mmol/L). ? The goal for HDL is 40 mg/dL (2.2 mmol/L) or higher for men and 50 mg/dL (2.8 mmol/L) or higher for women. An HDL cholesterol of 60 mg/dL (3.3 mmol/L) or higher gives some protection against heart disease. ? The goal for triglycerides is less than 150 mg/dL (8.3 mmol/L).  Liver function tests.  Kidney function tests.  Thyroid function tests. Dental and eye exams  Visit your dentist two times a year.  If you have type 1 diabetes, your health care provider may recommend an eye exam 3-5 years after you are diagnosed, and then once a year after your first exam. ?  For children with type 1 diabetes, a health care provider may recommend an eye exam when your child is age 91 or older and has had diabetes for 3-5 years. After the first exam, your child should get an eye exam once a year.  If you have type 2 diabetes, your health care provider may recommend an eye exam as soon as you are diagnosed, and then once a year after your first  exam. Immunizations   The yearly flu (influenza) vaccine is recommended for everyone 6 months or older who has diabetes.  The pneumonia (pneumococcal) vaccine is recommended for everyone 2 years or older who has diabetes. If you are 97 or older, you may get the pneumonia vaccine as a series of two separate shots.  The hepatitis B vaccine is recommended for adults shortly after being diagnosed with diabetes.  Adults and children with diabetes should receive all other vaccines according to age-specific recommendations from the Centers for Disease Control and Prevention (CDC). Mental and emotional health Screening for symptoms of eating disorders, anxiety, and depression is recommended at the time of diagnosis and afterward as needed. If your screening shows that you have symptoms (positive screening result), you may need more evaluation and you may work with a mental health care provider. Treatment plan Your treatment plan will be reviewed at every medical visit. You and your health care provider will discuss:  How you are taking your medicines, including insulin.  Any side effects you are experiencing.  Your blood glucose target goals.  The frequency of your blood glucose monitoring.  Lifestyle habits, such as activity level as well as tobacco, alcohol, and substance use. Diabetes self-management education Your health care provider will assess how well you are monitoring your blood glucose levels and whether you are taking your insulin correctly. He or she may refer you to:  A certified diabetes educator to manage your diabetes throughout your life, starting at diagnosis.  A registered dietitian who can create or review your personal nutrition plan.  An exercise specialist who can discuss your activity level and exercise plan. Summary  Managing diabetes (diabetes mellitus) can be complicated. Your diabetes treatment may be managed by a team of health care providers.  Your health  care providers follow guidelines in order to help you get the best quality of care.  Standards of care including having regular physical exams, blood tests, blood pressure monitoring, immunizations, screening tests, and education about how to manage your diabetes.  Your health care providers may also give you more specific instructions based on your individual health. This information is not intended to replace advice given to you by your health care provider. Make sure you discuss any questions you have with your health care provider. Document Released: 08/31/2009 Document Revised: 07/23/2018 Document Reviewed: 08/01/2016 Elsevier Interactive Patient Education  2019 [employer].

## 2018-12-21 NOTE — Progress Notes (Signed)
cbg [id number] 

## 2018-12-21 NOTE — Progress Notes (Signed)
Patient ID: Wanda Kane, female    DOB: 08-07-1961  MRN: [id number]  CC: Diabetes   Subjective: Wanda Kane is a 58 y.o. female who presents for new dx of DM Her concerns today include:  58 yr old AAF with hx ofHIV(followed by ID with undetected viral load), HTN, fibroid, HL, recent pap with pos HPV, DVT with filter.  DM:  A1C was 8.1 when checked on her last visit in December.  Patient informed of the results via MyChart and was asked to come in for further evaluation and management.  Since being notified of this, she has started eating low carb diet and exercising twice a wk at the gym.  Does sprints and some wgh training.  Loss 10 lbs in the past 1 mth She would like to hold off on being started on medication for diabetes.  She wants to work on further weight loss through healthy eating habits and regular exercise.  She saw a dietitian about a year ago and does not feel that she needs to see her again. -Since last visit she also terminated her involvement in the cholesterol research study.  Her cholesterol is not at goal for diabetes but she also wants to hold off on starting medication -She would like to see me once a month and would like A1c and cholesterol rechecked on her next visit  Onychomycosis:  Did no fill Lamisil as yet  Pos FIT:  Has changed insurance and wants to move forward with referral again for colonoscopy.  She wants to find out from coworkers who they would recommend she see for this.  She will send me a MyChart message about which GI specialist she would like the referral submitted to  Patient Active Problem List   Diagnosis Date Noted  . Nail fungus 01/01/2018  . Genital herpes 08/12/2017  . Positive fecal immunochemical test 06/29/2017  . Pap smear of cervix shows high risk HPV present 06/22/2017  . Hypertension 12/29/2014  . Perimenopause 10/26/2014  . Metabolic syndrome 54/98/2641  . Cutaneous skin tags 10/26/2014  . Back pain 05/16/2014  . Anemia  10/12/2013  . Myalgia 02/09/2013  . Condyloma acuminatum 10/27/2012  . Acquired acanthosis nigricans 10/27/2012  . Uterine fibroid 10/27/2012  . 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 HIV (human immunodeficiency virus infection) ([place]) 10/18/2012  . Dyslipidemia 10/18/2012  . Pre-diabetes 10/18/2012     Current Outpatient Medications on File Prior to Visit  Medication Sig Dispense Refill  . amLODipine (NORVASC) 10 MG tablet TAKE 1 TABLET (10 MG TOTAL) BY MOUTH DAILY. 90 tablet 0  . B-Complex-C TABS Take 1 tablet by mouth daily.    . ciclopirox (PENLAC) 8 % solution Apply topically at bedtime. Apply over nail QHS.  Apply  Daily over previous coat. After (7) days, may remove with alcohol and continue cycle. (Patient not taking: Reported on 12/21/2018) 6.6 mL 1  . JULUCA 50-25 MG TABS TAKE 1 TABLET BY MOUTH DAILY. 30 tablet 5  . lisinopril (PRINIVIL,ZESTRIL) 10 MG tablet TAKE 1 TABLET (10 MG TOTAL) BY MOUTH DAILY. 90 tablet 0  . Multiple Vitamin (MULTIVITAMIN) capsule Take 1 capsule by mouth daily.    Marland Kitchen spironolactone (ALDACTONE) 100 MG tablet TAKE 1 TABLET (100 MG TOTAL) BY MOUTH DAILY. 90 tablet 0  . terbinafine (LAMISIL) 250 MG tablet Take 1 tablet (250 mg total) by mouth daily. 30 tablet 1  . valACYclovir (VALTREX) 1000 MG tablet Take 1 tablet (1,000 mg total) by mouth as  directed. Take one tablet BID x 7 days then one tablet daily (Patient not taking: Reported on 11/18/2017) 34 tablet 11   No current facility-administered medications on file prior to visit.     Allergies  Allergen Reactions  . Pepcid [Famotidine] Other (See Comments)  . Prilosec [Omeprazole] Other (See Comments)  . Viread [Tenofovir Disoproxil] Other (See Comments)    Rhabdomyolysis    Social History   Socioeconomic History  . Marital status: Single    Spouse name: Not on file  . Number of children: Not on file  . Years of education: college, [place]  . Highest education level: Not on file    Occupational History  . Occupation: Research scientist (life sciences): [employer]  Social Needs  . Financial resource strain: Not on file  . Food insecurity:    Worry: Not on file    Inability: Not on file  . Transportation needs:    Medical: Not on file    Non-medical: Not on file  Tobacco Use  . Smoking status: Never Smoker  . Smokeless tobacco: Never Used  Substance and Sexual Activity  . Alcohol use: No  . Drug use: No  . Sexual activity: Not Currently    Partners: Male    Comment: patient declined condoms  Lifestyle  . Physical activity:    Days per week: Not on file    Minutes per session: Not on file  . Stress: Not on file  Relationships  . Social connections:    Talks on phone: Not on file    Gets together: Not on file    Attends religious service: Not on file    Active member of club or organization: Not on file    Attends meetings of clubs or organizations: Not on file    Relationship status: Not on file  . Intimate partner violence:    Fear of current or ex partner: Not on file    Emotionally abused: Not on file    Physically abused: Not on file    Forced sexual activity: Not on file  Other Topics Concern  . 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   She loves her job   Has BSW   Working towards Research officer, political party    Family History  Problem Relation Age of Onset  . Hypertension Mother   . CAD Brother   . Heart disease Brother 3       cardiac arrest    Past Surgical History:  Procedure Laterality Date  . CESAREAN SECTION  1983  . CESAREAN SECTION    . TONSILLECTOMY AND ADENOIDECTOMY      ROS: Review of Systems Negative except as above PHYSICAL EXAM: BP 125/84   Pulse 81   Temp 98.3 F (36.8 C) (Oral)   Resp 16   Wt 231 lb 6.4 oz (105 kg)   LMP 05/31/2016 (Approximate)   SpO2 95%   BMI 39.72 kg/m   Wt Readings from Last 3 Encounters:  12/21/18 231 lb 6.4 oz (105 kg)  11/19/18 237 lb 12 oz (107.8 kg)   11/15/18 241 lb 3.2 oz (109.4 kg)    Physical Exam  General appearance - alert, well appearing, and in no distress Mental status - normal mood, behavior, speech, dress, motor activity, and thought processes Neck - supple, no significant adenopathy Chest - clear to auscultation, no wheezes, rales or rhonchi, symmetric air entry Heart -  normal rate, regular rhythm, normal S1, S2, no murmurs, rubs, clicks or gallops Extremities - peripheral pulses normal, no pedal edema, no clubbing or cyanosis   ASSESSMENT AND PLAN: 1. New onset type 2 diabetes mellitus ([place]) Discussed the importance of healthy eating habits, regular aerobic exercise (at least 150 minutes a week as tolerated) and medication compliance to achieve or maintain control of diabetes. Commended her on 10 pounds weight loss in the past 1 month.  Encouraged her to continue healthy eating habits and regular exercise.  Dietary counseling given. -Advised patient that we can hold off on starting medication and have her follow-up at the end of March at which time we can repeat her A1c.  We also discussed the option of putting her on Victoza or Trulicity that will help not only with diabetes control but weight loss.  Patient want to hold off on medication at this time.  - Prescription sent to her pharmacy for diabetes testing supplies.  Advised to check blood sugars at least once a day alternating before breakfast and dinner.  I went over and wrote down blood sugar goals before and after meals. - POCT glucose (manual entry) - Microalbumin / creatinine urine ratio  2. Class 2 severe obesity with serious comorbidity and body mass index (BMI) of 39.0 to 39.9 in adult, unspecified obesity type ([place]) See #1 above  3. Hyperlipemia, mixed Patient wants to hold off on starting statin.  She would like cholesterol to be rechecked on her follow-up visit at the end of March.  4. Positive FIT (fecal immunochemical test) Patient to send me a my  chart message indicating which GI specialist she would want referral to  We will plan to do a Pap smear next visit. Patient has obtained flu shot through her employer.  Patient was given the opportunity to ask questions.  Patient verbalized understanding of the plan and was able to repeat key elements of the plan.   Orders Placed This Encounter  Procedures  . Microalbumin / creatinine urine ratio  . POCT glucose (manual entry)     Requested Prescriptions   Signed Prescriptions Disp Refills  . Blood Glucose Monitoring Suppl (TRUE METRIX METER) w/Device KIT 1 kit 0    Sig: Check sugar once a day  . glucose blood (TRUE METRIX BLOOD GLUCOSE TEST) test strip 100 each 12    Sig: Check sugar once a day  . TRUEPLUS LANCETS 28G MISC 100 each 12    Sig: Check sugar once a day    Return in about 2 months (around 02/19/2019) for PAP and f/u DM/cholesterol.  Karle Plumber, MD, FACP

## 2018-12-22 ENCOUNTER — Encounter: Payer: Self-pay | Admitting: Internal Medicine

## 2018-12-22 LAB — MICROALBUMIN / CREATININE URINE RATIO
CREATININE, UR: 208.7 mg/dL
Microalb/Creat Ratio: 25 mg/g creat (ref 0–29)
Microalbumin, Urine: 51.8 ug/mL

## 2018-12-28 MED FILL — JULUCA 50-25 MG TAB: 50-25 | 30 days supply | Qty: 30 | Fill #5

## 2019-01-21 ENCOUNTER — Other Ambulatory Visit: Payer: Self-pay | Admitting: Infectious Disease

## 2019-01-21 ENCOUNTER — Encounter: Payer: Self-pay | Admitting: *Deleted

## 2019-01-21 DIAGNOSIS — B20 Human immunodeficiency virus [HIV] disease: Secondary | ICD-10-CM

## 2019-02-01 ENCOUNTER — Ambulatory Visit: Payer: 59 | Admitting: Licensed Clinical Social Worker

## 2019-02-01 MED FILL — JULUCA 50-25 MG TAB: 50-25 | 30 days supply | Qty: 30 | Fill #0

## 2019-02-03 MED FILL — TERBINAFINE HCL 250 MG TAB: 250 | 30 days supply | Qty: 30 | Fill #1

## 2019-02-04 ENCOUNTER — Encounter: Payer: Self-pay | Admitting: Emergency Medicine

## 2019-02-04 ENCOUNTER — Encounter: Payer: Self-pay | Admitting: Internal Medicine

## 2019-02-04 ENCOUNTER — Telehealth: Payer: Self-pay | Admitting: Emergency Medicine

## 2019-02-04 NOTE — Telephone Encounter (Signed)
Patient was called after filling out the COVI-19 questionnaire..  Patient identified by name and date of birth.  Patient is negative according to the questionnaire.  Patient was asked the triage protocol questions for COVID-19 to which Patient called with concern for a need to be tested for COVID-19.  Patient at this time does not meet the requirements of a fever greater than 100.4, a cough without production, and shortness of breath.  Patient advised that COVID-19 testing is not warranted.  Patient asked for a work note.  Patient acknowledged understanding of information.

## 2019-02-07 ENCOUNTER — Other Ambulatory Visit: Payer: Self-pay | Admitting: Internal Medicine

## 2019-02-08 ENCOUNTER — Other Ambulatory Visit: Payer: Self-pay

## 2019-02-08 ENCOUNTER — Other Ambulatory Visit: Payer: Self-pay | Admitting: *Deleted

## 2019-02-08 DIAGNOSIS — B20 Human immunodeficiency virus [HIV] disease: Secondary | ICD-10-CM

## 2019-02-08 DIAGNOSIS — Z113 Encounter for screening for infections with a predominantly sexual mode of transmission: Secondary | ICD-10-CM

## 2019-02-09 LAB — T-HELPER CELL (CD4) - (RCID CLINIC ONLY)
CD4 T CELL HELPER: 22 % — AB (ref 33–55)
CD4 T Cell Abs: 860 /uL (ref 400–2700)

## 2019-02-09 LAB — URINE CYTOLOGY ANCILLARY ONLY
Chlamydia: NEGATIVE
Neisseria Gonorrhea: NEGATIVE

## 2019-02-17 ENCOUNTER — Ambulatory Visit: Payer: Self-pay | Admitting: Internal Medicine

## 2019-02-19 ENCOUNTER — Other Ambulatory Visit: Payer: Self-pay | Admitting: Internal Medicine

## 2019-02-19 DIAGNOSIS — B351 Tinea unguium: Secondary | ICD-10-CM

## 2019-02-21 ENCOUNTER — Telehealth: Payer: Self-pay

## 2019-02-22 LAB — COMPLETE METABOLIC PANEL WITH GFR
AG Ratio: 1.5 (calc) (ref 1.0–2.5)
ALT: 32 U/L — AB (ref 6–29)
AST: 30 U/L (ref 10–35)
Albumin: 4.3 g/dL (ref 3.6–5.1)
Alkaline phosphatase (APISO): 102 U/L (ref 37–153)
BUN: 11 mg/dL (ref 7–25)
CO2: 26 mmol/L (ref 20–32)
Calcium: 9.9 mg/dL (ref 8.6–10.4)
Chloride: 105 mmol/L (ref 98–110)
Creat: 0.92 mg/dL (ref 0.50–1.05)
GFR, Est African American: 80 mL/min/{1.73_m2} (ref >=60)
GFR, Est Non African American: 69 mL/min/{1.73_m2} (ref >=60)
Globulin: 2.9 g/dL (calc) (ref 1.9–3.7)
Glucose, Bld: 148 mg/dL — ABNORMAL HIGH (ref 65–99)
Potassium: 4.2 mmol/L (ref 3.5–5.3)
Sodium: 142 mmol/L (ref 135–146)
Total Bilirubin: 0.4 mg/dL (ref 0.2–1.2)
Total Protein: 7.2 g/dL (ref 6.1–8.1)

## 2019-02-22 LAB — CBC WITH DIFFERENTIAL/PLATELET
Absolute Monocytes: 493 cells/uL (ref 200–950)
Basophils Absolute: 44 cells/uL (ref 0–200)
Basophils Relative: 0.5 %
EOS PCT: 1.7 %
Eosinophils Absolute: 150 cells/uL (ref 15–500)
HCT: 42.2 % (ref 35.0–45.0)
Hemoglobin: 14.2 g/dL (ref 11.7–15.5)
Lymphs Abs: 3731 cells/uL (ref 850–3900)
MCH: 29.1 pg (ref 27.0–33.0)
MCHC: 33.6 g/dL (ref 32.0–36.0)
MCV: 86.5 fL (ref 80.0–100.0)
MONOS PCT: 5.6 %
MPV: 12.4 fL (ref 7.5–12.5)
Neutro Abs: 4382 cells/uL (ref 1500–7800)
Neutrophils Relative %: 49.8 %
PLATELETS: 286 10*3/uL (ref 140–400)
RBC: 4.88 10*6/uL (ref 3.80–5.10)
RDW: 12.7 % (ref 11.0–15.0)
Total Lymphocyte: 42.4 %
WBC: 8.8 10*3/uL (ref 3.8–10.8)

## 2019-02-22 LAB — RPR: RPR Ser Ql: NONREACTIVE

## 2019-02-22 LAB — HIV-1 RNA QUANT-NO REFLEX-BLD
HIV 1 RNA Quant: 36 copies/mL — ABNORMAL HIGH
HIV-1 RNA Quant, Log: 1.56 Log copies/mL — ABNORMAL HIGH

## 2019-02-24 MED FILL — JULUCA 50-25 MG TAB: 50-25 | 30 days supply | Qty: 30 | Fill #1

## 2019-02-24 MED FILL — TERBINAFINE HCL 250 MG TAB: 250 | 30 days supply | Qty: 30 | Fill #0

## 2019-02-25 NOTE — Telephone Encounter (Signed)
Error

## 2019-03-03 ENCOUNTER — Ambulatory Visit (INDEPENDENT_AMBULATORY_CARE_PROVIDER_SITE_OTHER): Payer: No Typology Code available for payment source | Admitting: Infectious Disease

## 2019-03-03 ENCOUNTER — Other Ambulatory Visit: Payer: Self-pay

## 2019-03-03 ENCOUNTER — Encounter: Payer: Self-pay | Admitting: Infectious Disease

## 2019-03-03 DIAGNOSIS — E1165 Type 2 diabetes mellitus with hyperglycemia: Secondary | ICD-10-CM | POA: Diagnosis not present

## 2019-03-03 DIAGNOSIS — B20 Human immunodeficiency virus [HIV] disease: Secondary | ICD-10-CM | POA: Diagnosis not present

## 2019-03-03 DIAGNOSIS — I10 Essential (primary) hypertension: Secondary | ICD-10-CM | POA: Diagnosis not present

## 2019-03-03 DIAGNOSIS — E782 Mixed hyperlipidemia: Secondary | ICD-10-CM

## 2019-03-03 DIAGNOSIS — Z6839 Body mass index (BMI) 39.0-39.9, adult: Secondary | ICD-10-CM

## 2019-03-03 HISTORY — DX: Type 2 diabetes mellitus with hyperglycemia: E11.65

## 2019-03-03 NOTE — Progress Notes (Signed)
Subjective:    Patient ID: Wanda Kane, female    DOB: 03/19/1961, 58 y.o.   MRN: [id number]   Virtual Visit via Telephone Note  I connected with Wanda Kane on 03/03/19 at  9:00 AM EDT by telephone and verified that I am speaking with the correct person using two identifiers.   I discussed the limitations, risks, security and privacy concerns of performing an evaluation and management service by telephone and the availability of in person appointments. I also discussed with the patient that there may be a patient responsible charge related to this service. The patient expressed understanding and agreed to proceed.  History of Present Illness:  No chief complaint on file.  Marlicia was in very good spirits when I called her.  She had checked her labs online through my chart.  Her viral load remains nicely controlled and her CD4 count is healthy.  Very much likes the JULUCA that she is taking and she takes this with chewable food and avoids antacids and proton pump inhibitors.  She has been followed by her primary care physician who had suggested initiation of a statin due to the fact that her LDL was not at goal and that she has been diagnosed with diabetes mellitus.  She is currently trying to lose weight to try to get the diabetes under control that way first.  I do think ultimately she should be placed on a statin because I suspect even with potential weight loss that her LDL will still not likely be at goal.  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  Doing her best to shelter in place and lives alone.  The systems as noted above otherwise 12 point review of systems is negative.    Past Medical History:  Diagnosis Date  . DVT (deep venous thrombosis) ([place]) Jan 2016  . Fibroids 2010  . Genital herpes 08/12/2017  . HIV infection ([place])  1994  . Hypertension 1994  . Type 2 diabetes mellitus with hyperglycemia ([place]) 03/03/2019  . Vaginal bleeding 12/2014   due to xarelto    Past Surgical History:  Procedure Laterality Date  . CESAREAN SECTION  1983  . CESAREAN SECTION    . TONSILLECTOMY AND ADENOIDECTOMY      Family History  Problem Relation Age of Onset  . Hypertension Mother   . CAD Brother   . Heart disease Brother 40       cardiac arrest      Social History   Socioeconomic History  . Marital status: Single    Spouse name: Not on file  . Number of children: Not on file  . Years of education: college, [place]  . Highest education level: Not on file  Occupational History  . Occupation: Research scientist (life sciences): [employer]  Social Needs  . Financial resource strain: Not on file  . Food insecurity:    Worry: Not on file    Inability: Not on file  . Transportation needs:    Medical: Not on file    Non-medical: Not on file  Tobacco Use  . Smoking status: Never Smoker  . Smokeless tobacco: Never Used  Substance and Sexual Activity  . Alcohol use: No  . Drug use: No  . Sexual activity: Not Currently    Partners: Male    Comment: patient  declined condoms  Lifestyle  . Physical activity:    Days per week: Not on file    Minutes per session: Not on file  . Stress: Not on file  Relationships  . Social connections:    Talks on phone: Not on file    Gets together: Not on file    Attends religious service: Not on file    Active member of club or organization: Not on file    Attends meetings of clubs or organizations: Not on file    Relationship status: Not on file  Other Topics Concern  . 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   She loves her job   Has BSW   Working towards LPN and RN    Allergies  Allergen Reactions  . Pepcid [Famotidine] Other (See Comments)  . Prilosec [Omeprazole] Other (See Comments)  . Viread [Tenofovir Disoproxil] Other (See  Comments)    Rhabdomyolysis     Current Outpatient Medications:  .  amLODipine (NORVASC) 10 MG tablet, TAKE 1 TABLET (10 MG TOTAL) BY MOUTH DAILY., Disp: 90 tablet, Rfl: 0 .  B-Complex-C TABS, Take 1 tablet by mouth daily., Disp: , Rfl:  .  Blood Glucose Monitoring Suppl (TRUE METRIX METER) w/Device KIT, Check sugar once a day, Disp: 1 kit, Rfl: 0 .  ciclopirox (PENLAC) 8 % solution, Apply topically at bedtime. Apply over nail QHS.  Apply  Daily over previous coat. After (7) days, may remove with alcohol and continue cycle., Disp: 6.6 mL, Rfl: 1 .  glucose blood (TRUE METRIX BLOOD GLUCOSE TEST) test strip, Check sugar once a day, Disp: 100 each, Rfl: 12 .  JULUCA 50-25 MG TABS, TAKE 1 TABLET BY MOUTH DAILY., Disp: 30 tablet, Rfl: 1 .  lisinopril (PRINIVIL,ZESTRIL) 10 MG tablet, TAKE 1 TABLET (10 MG TOTAL) BY MOUTH DAILY., Disp: 90 tablet, Rfl: 0 .  Multiple Vitamin (MULTIVITAMIN) capsule, Take 1 capsule by mouth daily., Disp: , Rfl:  .  spironolactone (ALDACTONE) 100 MG tablet, TAKE 1 TABLET (100 MG TOTAL) BY MOUTH DAILY., Disp: 90 tablet, Rfl: 0 .  terbinafine (LAMISIL) 250 MG tablet, TAKE 1 TABLET BY MOUTH DAILY., Disp: 30 tablet, Rfl: 0 .  TRUEPLUS LANCETS 28G MISC, Check sugar once a day, Disp: 100 each, Rfl: 12    Observations/Objective:  Roe is doing very well with regards her HIV maintaining perfect virological suppression and tending her immune system.  She is trying to lose weight to help treat her diabetes mellitus.  She likely will need initiation of a statin  Assessment and Plan:  #1 HIV disease: Doing very well and continue on [place]  2 diabetes mellitus I do think try to lose weight as a means to treat this is a very good idea.  3.  Hyperlipidemia I think she will need initiation of a statin such as Lipitor or potentially Crestor to drop her LDL to goal.  4.  Avoiding novel coronavirus 2019: She will continue to shelter in place.  Follow Up Instructions:   Will  follow-up and see me in roughly 6 months time. I discussed the assessment and treatment plan with the patient. The patient was provided an opportunity to ask questions and all were answered. The patient agreed with the plan and demonstrated an understanding of the instructions.   The patient was advised to call back or seek an in-person evaluation if the symptoms worsen or if the condition fails to  improve as anticipated.  I provided 30 minutes of non-face-to-face time during this encounter.   Alcide Evener, MD

## 2019-03-07 ENCOUNTER — Emergency Department (HOSPITAL_COMMUNITY)
Admission: EM | Admit: 2019-03-07 | Discharge: 2019-03-07 | Disposition: A | Discharge status: Home / Self Care | Payer: No Typology Code available for payment source | Source: Home / Self Care | Attending: Emergency Medicine | Admitting: Emergency Medicine

## 2019-03-07 ENCOUNTER — Telehealth: Payer: Self-pay | Admitting: Behavioral Health

## 2019-03-07 ENCOUNTER — Emergency Department (HOSPITAL_COMMUNITY): Payer: No Typology Code available for payment source

## 2019-03-07 ENCOUNTER — Encounter (HOSPITAL_COMMUNITY): Payer: Self-pay

## 2019-03-07 ENCOUNTER — Other Ambulatory Visit: Payer: Self-pay

## 2019-03-07 DIAGNOSIS — B20 Human immunodeficiency virus [HIV] disease: Secondary | ICD-10-CM | POA: Insufficient documentation

## 2019-03-07 DIAGNOSIS — E1165 Type 2 diabetes mellitus with hyperglycemia: Secondary | ICD-10-CM

## 2019-03-07 DIAGNOSIS — K853 Drug induced acute pancreatitis without necrosis or infection: Secondary | ICD-10-CM | POA: Insufficient documentation

## 2019-03-07 DIAGNOSIS — I10 Essential (primary) hypertension: Secondary | ICD-10-CM

## 2019-03-07 LAB — LIPID PANEL
Cholesterol: 241 mg/dL — ABNORMAL HIGH (ref 0–200)
HDL: 52 mg/dL (ref >40)
LDL Cholesterol: 165 mg/dL — ABNORMAL HIGH (ref 0–99)
Total CHOL/HDL Ratio: 4.6 RATIO
Triglycerides: 119 mg/dL (ref <150)
VLDL: 24 mg/dL (ref 0–40)

## 2019-03-07 LAB — COMPREHENSIVE METABOLIC PANEL
ALT: 24 U/L (ref 0–44)
AST: 33 U/L (ref 15–41)
Albumin: 4 g/dL (ref 3.5–5.0)
Alkaline Phosphatase: 106 U/L (ref 38–126)
Anion gap: 11 (ref 5–15)
BUN: 16 mg/dL (ref 6–20)
CO2: 24 mmol/L (ref 22–32)
Calcium: 9.1 mg/dL (ref 8.9–10.3)
Chloride: 104 mmol/L (ref 98–111)
Creatinine, Ser: 0.87 mg/dL (ref 0.44–1.00)
GFR calc Af Amer: >60 mL/min (ref >60)
GFR calc non Af Amer: >60 mL/min (ref >60)
Glucose, Bld: 235 mg/dL — ABNORMAL HIGH (ref 70–99)
Potassium: 3.7 mmol/L (ref 3.5–5.1)
Sodium: 139 mmol/L (ref 135–145)
Total Bilirubin: 0.7 mg/dL (ref 0.3–1.2)
Total Protein: 7.5 g/dL (ref 6.5–8.1)

## 2019-03-07 LAB — CBC WITH DIFFERENTIAL/PLATELET
Abs Immature Granulocytes: 0.1 10*3/uL — ABNORMAL HIGH (ref 0.00–0.07)
Basophils Absolute: 0 10*3/uL (ref 0.0–0.1)
Basophils Relative: 0 %
Eosinophils Absolute: 0.1 10*3/uL (ref 0.0–0.5)
Eosinophils Relative: 0 %
HCT: 44.1 % (ref 36.0–46.0)
Hemoglobin: 14.4 g/dL (ref 12.0–15.0)
Immature Granulocytes: 1 %
Lymphocytes Relative: 16 %
Lymphs Abs: 2.9 10*3/uL (ref 0.7–4.0)
MCH: 29.5 pg (ref 26.0–34.0)
MCHC: 32.7 g/dL (ref 30.0–36.0)
MCV: 90.4 fL (ref 80.0–100.0)
Monocytes Absolute: 1.4 10*3/uL — ABNORMAL HIGH (ref 0.1–1.0)
Monocytes Relative: 7 %
Neutro Abs: 14 10*3/uL — ABNORMAL HIGH (ref 1.7–7.7)
Neutrophils Relative %: 76 %
Platelets: 292 10*3/uL (ref 150–400)
RBC: 4.88 MIL/uL (ref 3.87–5.11)
RDW: 12.7 % (ref 11.5–15.5)
WBC: 18.5 10*3/uL — ABNORMAL HIGH (ref 4.0–10.5)
nRBC: 0 % (ref 0.0–0.2)

## 2019-03-07 LAB — LIPASE, BLOOD: Lipase: 4812 U/L — ABNORMAL HIGH (ref 11–51)

## 2019-03-07 MED ORDER — PROMETHAZINE HCL 25 MG PO TABS: 25.0000 mg | ORAL_TABLET | Freq: Three times a day (TID) | ORAL | 0 refills | Status: DC | PRN

## 2019-03-07 MED ORDER — SODIUM CHLORIDE (PF) 0.9 % IJ SOLN
INTRAMUSCULAR | Status: AC
  Filled 2019-03-07: qty 50

## 2019-03-07 MED ORDER — IOHEXOL 300 MG/ML  SOLN
100.0000 mL | Freq: Once | INTRAMUSCULAR | Status: AC | PRN
  Administered 2019-03-07: 100 mL via INTRAVENOUS

## 2019-03-07 MED ORDER — ONDANSETRON HCL 4 MG/2ML IJ SOLN
4.0000 mg | Freq: Once | INTRAMUSCULAR | Status: AC
  Administered 2019-03-07: 4 mg via INTRAVENOUS
  Filled 2019-03-07: qty 2

## 2019-03-07 MED ORDER — HYDROCODONE-ACETAMINOPHEN 5-325 MG PO TABS: 1.0000 | ORAL_TABLET | Freq: Four times a day (QID) | ORAL | 0 refills | Status: DC | PRN

## 2019-03-07 NOTE — Care Management (Signed)
This is a no charge note  I was called to do admission for this pt, but per EDP, Dr. Christy Gentles, pt will go home and the admission is canceled.    Wanda Costa, MD  [facility]   If 7PM-7AM, please contact night-coverage [email or web address] Password [facility] 03/07/2019, 6:31 AM

## 2019-03-07 NOTE — Telephone Encounter (Signed)
Patient called this stating she went to the ED early this morning as was diagnosed with Acute pancreatitis. She states the ED physician tried to get her to stay at the hospital but she states he had to go home and was going to try to manage pain at home because she has a test to complete.   She states she was ordered Vicodin which is not managing the pain at this time.  She states it is just making her sleepy.  She wanted to see if Dr. Tommy Medal had any other suggestions for pain control. Pricilla Riffle RN

## 2019-03-07 NOTE — ED Triage Notes (Signed)
Pt states she had seafood pho for dinner around 830 and began vomiting around 2am. States she feels like her stomach has been expanding, denies any diarrhea. Some abdominal pain.

## 2019-03-07 NOTE — Telephone Encounter (Signed)
Wanda Kane actually called her myself this morning to talk about her visit.  I am a bit skeptical that the JULUCA is causing her pancreatitis.  I will put in the phone note later today documenting her conversation.  She did not asked me about pain medications over the phone.  Unfortunately we are very limited with our clinic not prescribing opiates  She could take 2 ibuprofens with the Tylenol 4 times a day to see if this helps  She will just need to make sure she does not take more than 4 g of acetaminophen in a 24-hour.  I think probably the best thing would have been if she had been admitted but she told me she had a class she needed to take.  Perhaps if she is still not doing well in the next few days we could see her in clinic and arrange for direct admission

## 2019-03-07 NOTE — ED Provider Notes (Signed)
°[facility]-EMERGENCY DEPT °Provider Note ° ° °CSN: [id number] °Arrival date & time: 03/07/19  0259 ° ° ° °History   °Chief Complaint °Chief Complaint  °Patient presents with  °• Abdominal Pain  ° ° °HPI °Wanda Kane is a 58 y.o. female. ° °  ° °The history is provided by the patient.  °Abdominal Pain  °Pain location:  Epigastric °Pain quality: aching   °Pain radiates to:  Does not radiate °Pain severity:  Moderate °Onset quality:  Gradual °Timing:  Constant °Progression:  Worsening °Chronicity:  New °Relieved by:  Nothing °Worsened by:  Nothing °Ineffective treatments:  None tried °Associated symptoms: nausea and vomiting   °Associated symptoms: no chest pain, no cough, no diarrhea, no fever and no hematemesis   ° °Pt reports she ate takeout seafood Pho for dinner followed by oreo cookies °A few hours later she had onset of vomiting and abdominal pain °  No significant diarrhea °She has had several episodes of vomiting °No cough/fever ° °Past Medical History:  °Diagnosis Date  °• DVT (deep venous thrombosis) ([facility]) Jan 2016  °• Fibroids 2010  °• Genital herpes 08/12/2017  °• HIV infection ([facility]) 1994  °• Hypertension 1994  °• Type 2 diabetes mellitus with hyperglycemia ([facility]) 03/03/2019  °• Vaginal bleeding 12/2014  ° due to xarelto  ° ° °Patient Active Problem List  ° Diagnosis Date Noted  °• Type 2 diabetes mellitus with hyperglycemia ([facility]) 03/03/2019  °• New onset type 2 diabetes mellitus ([facility]) 12/21/2018  °• Class 2 severe obesity with serious comorbidity and body mass index (BMI) of 39.0 to 39.9 in adult ([facility]) 12/21/2018  °• Hyperlipemia, mixed 12/21/2018  °• Nail fungus 01/01/2018  °• Genital herpes 08/12/2017  °• Positive fecal immunochemical test 06/29/2017  °• Pap smear of cervix shows high risk HPV present 06/22/2017  °• HIV disease ([facility]) 11/22/2015  °• Hypertension 12/29/2014  °• Perimenopause 10/26/2014  °• Metabolic syndrome 10/26/2014  °• Cutaneous skin tags 10/26/2014  °• Back pain  05/16/2014  °• Anemia 10/12/2013  °• Myalgia 02/09/2013  °• Condyloma acuminatum 10/27/2012  °• Acquired acanthosis nigricans 10/27/2012  °• Uterine fibroid 10/27/2012  °• HIV (human immunodeficiency virus infection) ([facility]) 10/18/2012  ° ° °Past Surgical History:  °Procedure Laterality Date  °• CESAREAN SECTION  1983  °• CESAREAN SECTION    °• TONSILLECTOMY AND ADENOIDECTOMY    °  ° °OB History   ° Gravida  °1  ° Para  °1  ° Term  °1  ° Preterm  °0  ° AB  °0  ° Living  °1  °  ° SAB  °0  ° TAB  °0  ° Ectopic  °0  ° Multiple  °0  ° Live Births  °   °   °  °  ° ° ° °Home Medications   ° °Prior to Admission medications   °Medication Sig Start Date End Date Taking? Authorizing Provider  °amLODipine (NORVASC) 10 MG tablet TAKE 1 TABLET (10 MG TOTAL) BY MOUTH DAILY. 11/26/18   Johnson, Deborah B, MD  °B-Complex-C TABS Take 1 tablet by mouth daily.    [provider]  °Blood Glucose Monitoring Suppl (TRUE METRIX METER) w/Device KIT Check sugar once a day 12/21/18   Johnson, Deborah B, MD  °ciclopirox (PENLAC) 8 % solution Apply topically at bedtime. Apply over nail QHS.  Apply  Daily over previous coat. After (7) days, may remove with alcohol and continue cycle. 01/01/18   Johnson, Deborah B,   MD  °glucose blood (TRUE METRIX BLOOD GLUCOSE TEST) test strip Check sugar once a day 12/21/18   Johnson, Deborah B, MD  °JULUCA 50-25 MG TABS TAKE 1 TABLET BY MOUTH DAILY. 01/21/19   Van Dam, Cornelius N, MD  °lisinopril (PRINIVIL,ZESTRIL) 10 MG tablet TAKE 1 TABLET (10 MG TOTAL) BY MOUTH DAILY. 11/26/18   Johnson, Deborah B, MD  °Multiple Vitamin (MULTIVITAMIN) capsule Take 1 capsule by mouth daily.    [provider]  °spironolactone (ALDACTONE) 100 MG tablet TAKE 1 TABLET (100 MG TOTAL) BY MOUTH DAILY. 11/26/18   Johnson, Deborah B, MD  °terbinafine (LAMISIL) 250 MG tablet TAKE 1 TABLET BY MOUTH DAILY. 02/21/19   Johnson, Deborah B, MD  °TRUEPLUS LANCETS 28G MISC Check sugar once a day 12/21/18   Johnson, Deborah B, MD   ° ° °Family History °Family History  °Problem Relation Age of Onset  °• Hypertension Mother   °• CAD Brother   °• Heart disease Brother 42  °     cardiac arrest  ° ° °Social History °Social History  ° °Tobacco Use  °• Smoking status: Never Smoker  °• Smokeless tobacco: Never Used  °Substance Use Topics  °• Alcohol use: No  °• Drug use: No  ° ° ° °Allergies   °Pepcid [famotidine]; Prilosec [omeprazole]; and Viread [tenofovir disoproxil] ° ° °Review of Systems °Review of Systems  °Constitutional: Negative for fever.  °Respiratory: Negative for cough.   °Cardiovascular: Negative for chest pain.  °Gastrointestinal: Positive for abdominal pain, nausea and vomiting. Negative for diarrhea and hematemesis.  °All other systems reviewed and are negative. ° ° ° °Physical Exam °Updated Vital Signs °BP (!) 146/81 (BP Location: Left Arm)    Pulse (!) 104    Temp 98.1 °F (36.7 °C) (Oral)    Resp 18    LMP 05/31/2016 (Approximate)    SpO2 96%  ° °Physical Exam °CONSTITUTIONAL: Well developed/well nourished °HEAD: Normocephalic/atraumatic °EYES: EOMI/PERRL, no icterus °ENMT: Mucous membranes moist °NECK: supple no meningeal signs °SPINE/BACK:entire spine nontender °CV: S1/S2 noted, no murmurs/rubs/gallops noted °LUNGS: Lungs are clear to auscultation bilaterally, no apparent distress °ABDOMEN: soft, mild epigastric tenderness, no rebound or guarding, bowel sounds noted throughout abdomen °GU:no cva tenderness °NEURO: Pt is awake/alert/appropriate, moves all extremitiesx4.  No facial droop.   °EXTREMITIES: pulses normal/equal, full ROM °SKIN: warm, color normal °PSYCH: no abnormalities of mood noted, alert and oriented to situation ° ° °ED Treatments / Results  °Labs °(all labs ordered are listed, but only abnormal results are displayed) °Labs Reviewed  °CBC WITH DIFFERENTIAL/PLATELET - Abnormal; Notable for the following components:  °    Result Value  ° WBC 18.5 (*)   ° Neutro Abs 14.0 (*)   ° Monocytes Absolute 1.4 (*)   ° Abs  Immature Granulocytes 0.10 (*)   ° All other components within normal limits  °COMPREHENSIVE METABOLIC PANEL - Abnormal; Notable for the following components:  ° Glucose, Bld 235 (*)   ° All other components within normal limits  °LIPASE, BLOOD - Abnormal; Notable for the following components:  ° Lipase 4,812 (*)   ° All other components within normal limits  °LIPID PANEL  ° ° °EKG °EKG Interpretation ° °Date/Time:  Monday March 07 2019 03:38:42 EDT °Ventricular Rate:  95 °PR Interval:  148 °QRS Duration: 108 °QT Interval:  378 °QTC Calculation: 475 °R Axis:   103 °Text Interpretation:  Normal sinus rhythm Rightward axis Incomplete right bundle branch block Borderline ECG No significant change since   last tracing Confirmed by ,  ([id number]) on 03/07/2019 3:56:30 AM ° ° °Radiology °Ct Abdomen Pelvis W Contrast ° °Result Date: 03/07/2019 °CLINICAL DATA:  58-year-old female with abdominal pain nausea and vomiting. EXAM: CT ABDOMEN AND PELVIS WITH CONTRAST TECHNIQUE: Multidetector CT imaging of the abdomen and pelvis was performed using the standard protocol following bolus administration of intravenous contrast. CONTRAST:  100mL OMNIPAQUE IOHEXOL 300 MG/ML  SOLN COMPARISON:  Pelvis ultrasound 04/25/2016. FINDINGS: Lower chest: Negative, minor lung base atelectasis. No pericardial or pleural effusion. Hepatobiliary: Small benign appearing low-density area in the right hepatic lobe measuring 7 millimeters on series 2, image 17. Negative gallbladder. No bile duct enlargement. Pancreas: Confluent peripancreatic inflammation. The pancreas appears mildly enlarged. No pancreatic necrosis or ductal dilatation identified. Inflammation tracks in the midline retroperitoneum inferiorly and in the root of the small bowel mesentery. There is secondary inflammation of the duodenum. No organized or drainable fluid collection. Spleen: Trace perisplenic free fluid. Adrenals/Urinary Tract: Negative adrenal glands. Bilateral renal  enhancement and contrast excretion symmetric and within normal limits. Occasional small benign appearing renal cysts. Negative ureters and urinary bladder. Stomach/Bowel: Decompressed and negative large bowel from the splenic flexure distally. Pancreatic related inflammation adjacent to some of the more proximal large bowel and also the appendix, but the appendix is not inflamed (coronal image 89. Negative terminal ileum. No dilated small bowel. Pancreas related inflammation at the root of the small bowel mesentery. Largely decompressed stomach. There does appear to be secondary inflammation of the duodenum adjacent to the pancreas. No free air. Small volume free fluid. Vascular/Lymphatic: Suboptimal intravascular contrast bolus but the major arterial structures and portal venous system appear patent. There is an IVC filter in place. No lymphadenopathy. Reproductive: Fibroid uterus. Lobulated left fundal and uterine body intramural fibroids, partially calcified. Negative ovaries. Other: No pelvic free fluid. Musculoskeletal: Advanced lumbar disc and endplate degeneration. Advanced lower lumbar facet degeneration with vacuum facet. Small benign appearing bone lesion of the left femur greater trochanter, perhaps in chondroma (coronal image 114) no acute osseous abnormality identified. IMPRESSION: 1. Acute Pancreatitis. Peripancreatic inflammation with secondary inflammation of the duodenum. Small volume free fluid with no organized or drainable fluid collection. No pancreatic necrosis or other complicating features. 2. IVC filter in place.  Fibroid uterus. Electronically Signed   By: H  Hall M.D.   On: 03/07/2019 05:45  ° ° °Procedures °Procedures  °Medications Ordered in ED °Medications  °sodium chloride (PF) 0.9 % injection (has no administration in time range)  °ondansetron (ZOFRAN) injection 4 mg (4 mg Intravenous Given 03/07/19 0448)  °iohexol (OMNIPAQUE) 300 MG/ML solution 100 mL (100 mLs Intravenous Contrast  Given 03/07/19 0516)  ° ° ° °Initial Impression / Assessment and Plan / ED Course  °I have reviewed the triage vital signs and the nursing notes. ° °Pertinent labs & imaging results that were available during my care of the patient were reviewed by me and considered in my medical decision making (see chart for details). ° °  ° °  ° °3:22 AM °Pt with vomiting after eating seafood °Denies seafood allergy, no signs of allergic rxn °Will check labs and reassess °Pt declines meds °No cough/fever to suggest COVID °4:50 AM °Patient with continued nausea and pain. °Her pain is mostly left lower quadrant.  We will proceed with CT imaging °6:36 AM °I personally reviewed CT scan, results reveal acute pancreatitis without necrosis °Lipase is over 4000. °Patient does not drink alcohol, no signs of any cholelithiasis. °Reviewing her medications, suspicious   it may be due to Juluca which can raise triglycerides. °Patient reports feeling improved.  She is in no acute distress. °She is requesting discharge home. °Plan will be to send home with pain medicines and antiemetics.  She will need to have close consultation with her infectious disease doctor to see if any medication adjustment is warranted. °We discussed strict return precautions  including worsening abdominal pain  fever >100.4F with repetitive vomiting over next day ° °Final Clinical Impressions(s) / ED Diagnoses  ° °Final diagnoses:  °Drug-induced acute pancreatitis without infection or necrosis  ° ° °ED Discharge Orders   °      Ordered  °  promethazine (PHENERGAN) 25 MG tablet  Every 8 hours PRN    ° 03/07/19 0631  °  HYDROcodone-acetaminophen (NORCO/VICODIN) 5-325 MG tablet  Every 6 hours PRN    ° 03/07/19 0631  °  °  °  ° °  °, , MD °03/07/19 0638 ° °

## 2019-03-07 NOTE — Telephone Encounter (Signed)
Called patient to inform her Dr. Tommy Medal had availability today to see her in the office and she could be directly admitted to the hospital.  Patient  States she is unable to come today.  She states she is going to attempt to manage her pain at home.  She states she is feeling slightly better. She states next time she takes her medication she will take the promethazine first and then the Vicodin.  She states she is on a clear liquid diet.  Informed her to get chicken broth, gatorade or Pedialyte and then take her medications.  Informed patient since she is unable to come into the office if her pain is not under control and she if  vomiting continues after hours she needs to go to the emergency room.  Patient verbalized understanding. Pricilla Riffle RN

## 2019-03-09 ENCOUNTER — Ambulatory Visit (INDEPENDENT_AMBULATORY_CARE_PROVIDER_SITE_OTHER): Payer: No Typology Code available for payment source | Admitting: Infectious Disease

## 2019-03-09 ENCOUNTER — Ambulatory Visit: Payer: No Typology Code available for payment source | Admitting: Infectious Disease

## 2019-03-09 ENCOUNTER — Inpatient Hospital Stay (HOSPITAL_COMMUNITY)
Admission: AD | Admit: 2019-03-09 | Discharge: 2019-03-12 | DRG: 440 | Disposition: A | Discharge status: Home / Self Care | Payer: No Typology Code available for payment source | Source: Ambulatory Visit | Attending: Internal Medicine | Admitting: Internal Medicine

## 2019-03-09 ENCOUNTER — Encounter: Payer: Self-pay | Admitting: Infectious Disease

## 2019-03-09 ENCOUNTER — Telehealth: Payer: Self-pay

## 2019-03-09 ENCOUNTER — Observation Stay (HOSPITAL_COMMUNITY): Payer: No Typology Code available for payment source

## 2019-03-09 ENCOUNTER — Encounter (HOSPITAL_COMMUNITY): Payer: Self-pay | Admitting: *Deleted

## 2019-03-09 ENCOUNTER — Other Ambulatory Visit: Payer: Self-pay

## 2019-03-09 VITALS — BP 118/78 | HR 115 | Temp 99.5°F | Wt 231.0 lb

## 2019-03-09 DIAGNOSIS — B20 Human immunodeficiency virus [HIV] disease: Secondary | ICD-10-CM

## 2019-03-09 DIAGNOSIS — Z21 Asymptomatic human immunodeficiency virus [HIV] infection status: Secondary | ICD-10-CM | POA: Diagnosis present

## 2019-03-09 DIAGNOSIS — I10 Essential (primary) hypertension: Secondary | ICD-10-CM | POA: Diagnosis present

## 2019-03-09 DIAGNOSIS — K859 Acute pancreatitis without necrosis or infection, unspecified: Secondary | ICD-10-CM | POA: Diagnosis not present

## 2019-03-09 DIAGNOSIS — M60839 Other myositis, unspecified forearm: Secondary | ICD-10-CM | POA: Diagnosis not present

## 2019-03-09 DIAGNOSIS — Y929 Unspecified place or not applicable: Secondary | ICD-10-CM

## 2019-03-09 DIAGNOSIS — T50995A Adverse effect of other drugs, medicaments and biological substances, initial encounter: Secondary | ICD-10-CM | POA: Diagnosis present

## 2019-03-09 DIAGNOSIS — D72829 Elevated white blood cell count, unspecified: Secondary | ICD-10-CM | POA: Diagnosis present

## 2019-03-09 DIAGNOSIS — Z8249 Family history of ischemic heart disease and other diseases of the circulatory system: Secondary | ICD-10-CM

## 2019-03-09 DIAGNOSIS — Z95828 Presence of other vascular implants and grafts: Secondary | ICD-10-CM

## 2019-03-09 DIAGNOSIS — E119 Type 2 diabetes mellitus without complications: Secondary | ICD-10-CM

## 2019-03-09 DIAGNOSIS — R140 Abdominal distension (gaseous): Secondary | ICD-10-CM | POA: Diagnosis not present

## 2019-03-09 DIAGNOSIS — K8500 Idiopathic acute pancreatitis without necrosis or infection: Secondary | ICD-10-CM | POA: Diagnosis not present

## 2019-03-09 DIAGNOSIS — K5900 Constipation, unspecified: Secondary | ICD-10-CM | POA: Diagnosis present

## 2019-03-09 DIAGNOSIS — E876 Hypokalemia: Secondary | ICD-10-CM | POA: Diagnosis present

## 2019-03-09 DIAGNOSIS — B351 Tinea unguium: Secondary | ICD-10-CM | POA: Diagnosis not present

## 2019-03-09 DIAGNOSIS — E782 Mixed hyperlipidemia: Secondary | ICD-10-CM | POA: Diagnosis present

## 2019-03-09 DIAGNOSIS — K853 Drug induced acute pancreatitis without necrosis or infection: Principal | ICD-10-CM | POA: Diagnosis present

## 2019-03-09 DIAGNOSIS — K858 Other acute pancreatitis without necrosis or infection: Secondary | ICD-10-CM

## 2019-03-09 DIAGNOSIS — Z8241 Family history of sudden cardiac death: Secondary | ICD-10-CM

## 2019-03-09 DIAGNOSIS — E1165 Type 2 diabetes mellitus with hyperglycemia: Secondary | ICD-10-CM | POA: Diagnosis present

## 2019-03-09 DIAGNOSIS — M609 Myositis, unspecified: Secondary | ICD-10-CM

## 2019-03-09 DIAGNOSIS — Z86718 Personal history of other venous thrombosis and embolism: Secondary | ICD-10-CM

## 2019-03-09 DIAGNOSIS — Z6839 Body mass index (BMI) 39.0-39.9, adult: Secondary | ICD-10-CM

## 2019-03-09 DIAGNOSIS — Z79899 Other long term (current) drug therapy: Secondary | ICD-10-CM

## 2019-03-09 DIAGNOSIS — K760 Fatty (change of) liver, not elsewhere classified: Secondary | ICD-10-CM | POA: Diagnosis present

## 2019-03-09 DIAGNOSIS — Z888 Allergy status to other drugs, medicaments and biological substances status: Secondary | ICD-10-CM

## 2019-03-09 HISTORY — DX: Prediabetes: R73.03

## 2019-03-09 HISTORY — DX: Other specified health status: Z78.9

## 2019-03-09 HISTORY — DX: Myositis, unspecified: M60.9

## 2019-03-09 HISTORY — DX: Acute pancreatitis without necrosis or infection, unspecified: K85.90

## 2019-03-09 LAB — CBC
HCT: 41.7 % (ref 36.0–46.0)
Hemoglobin: 14.1 g/dL (ref 12.0–15.0)
MCH: 29.4 pg (ref 26.0–34.0)
MCHC: 33.8 g/dL (ref 30.0–36.0)
MCV: 87.1 fL (ref 80.0–100.0)
Platelets: 244 10*3/uL (ref 150–400)
RBC: 4.79 MIL/uL (ref 3.87–5.11)
RDW: 12.8 % (ref 11.5–15.5)
WBC: 26.7 10*3/uL — ABNORMAL HIGH (ref 4.0–10.5)
nRBC: 0 % (ref 0.0–0.2)

## 2019-03-09 LAB — COMPREHENSIVE METABOLIC PANEL
ALT: 22 U/L (ref 0–44)
AST: 29 U/L (ref 15–41)
Albumin: 3.6 g/dL (ref 3.5–5.0)
Alkaline Phosphatase: 79 U/L (ref 38–126)
Anion gap: 15 (ref 5–15)
BUN: 9 mg/dL (ref 6–20)
CO2: 27 mmol/L (ref 22–32)
Calcium: 9.9 mg/dL (ref 8.9–10.3)
Chloride: 93 mmol/L — ABNORMAL LOW (ref 98–111)
Creatinine, Ser: 0.98 mg/dL (ref 0.44–1.00)
GFR calc Af Amer: >60 mL/min (ref >60)
GFR calc non Af Amer: >60 mL/min (ref >60)
Glucose, Bld: 181 mg/dL — ABNORMAL HIGH (ref 70–99)
Potassium: 3.5 mmol/L (ref 3.5–5.1)
Sodium: 135 mmol/L (ref 135–145)
Total Bilirubin: 1.5 mg/dL — ABNORMAL HIGH (ref 0.3–1.2)
Total Protein: 7.7 g/dL (ref 6.5–8.1)

## 2019-03-09 LAB — LIPASE, BLOOD: Lipase: 34 U/L (ref 11–51)

## 2019-03-09 MED ORDER — MORPHINE SULFATE (PF) 2 MG/ML IV SOLN: 2.0000 mg | INTRAVENOUS | Status: DC | PRN

## 2019-03-09 MED ORDER — ONDANSETRON HCL 4 MG PO TABS: 4.0000 mg | ORAL_TABLET | Freq: Four times a day (QID) | ORAL | Status: DC | PRN

## 2019-03-09 MED ORDER — INSULIN ASPART 100 UNIT/ML ~~LOC~~ SOLN
0.0000 [IU] | Freq: Three times a day (TID) | SUBCUTANEOUS | Status: DC
  Administered 2019-03-09: 2 [IU] via SUBCUTANEOUS
  Administered 2019-03-10: 1 [IU] via SUBCUTANEOUS
  Administered 2019-03-10: 2 [IU] via SUBCUTANEOUS
  Administered 2019-03-10: 1 [IU] via SUBCUTANEOUS
  Administered 2019-03-11: 2 [IU] via SUBCUTANEOUS
  Administered 2019-03-11: 1 [IU] via SUBCUTANEOUS
  Administered 2019-03-11: 2 [IU] via SUBCUTANEOUS
  Administered 2019-03-12: 1 [IU] via SUBCUTANEOUS
  Administered 2019-03-12: 2 [IU] via SUBCUTANEOUS

## 2019-03-09 MED ORDER — LACTATED RINGERS IV BOLUS
1000.0000 mL | INTRAVENOUS | Status: AC
  Administered 2019-03-09: 1000 mL via INTRAVENOUS

## 2019-03-09 MED ORDER — ACETAMINOPHEN 325 MG PO TABS
650.0000 mg | ORAL_TABLET | Freq: Four times a day (QID) | ORAL | Status: DC | PRN
  Administered 2019-03-10: 650 mg via ORAL
  Filled 2019-03-09: qty 2

## 2019-03-09 MED ORDER — ALBUTEROL SULFATE (2.5 MG/3ML) 0.083% IN NEBU: 2.5000 mg | INHALATION_SOLUTION | Freq: Four times a day (QID) | RESPIRATORY_TRACT | Status: DC | PRN

## 2019-03-09 MED ORDER — LACTATED RINGERS IV SOLN
INTRAVENOUS | Status: DC
  Administered 2019-03-09 – 2019-03-11 (×5): via INTRAVENOUS

## 2019-03-09 MED ORDER — ACETAMINOPHEN 650 MG RE SUPP: 650.0000 mg | Freq: Four times a day (QID) | RECTAL | Status: DC | PRN

## 2019-03-09 MED ORDER — AMLODIPINE BESYLATE 10 MG PO TABS
10.0000 mg | ORAL_TABLET | Freq: Every day | ORAL | Status: DC
  Administered 2019-03-09 – 2019-03-12 (×4): 10 mg via ORAL
  Filled 2019-03-09 (×5): qty 1

## 2019-03-09 MED ORDER — ONDANSETRON HCL 4 MG/2ML IJ SOLN: 4.0000 mg | Freq: Four times a day (QID) | INTRAMUSCULAR | Status: DC | PRN

## 2019-03-09 MED ORDER — SODIUM CHLORIDE 0.9% FLUSH
3.0000 mL | Freq: Two times a day (BID) | INTRAVENOUS | Status: DC
  Administered 2019-03-11: 3 mL via INTRAVENOUS

## 2019-03-09 MED ORDER — ENOXAPARIN SODIUM 40 MG/0.4ML ~~LOC~~ SOLN
40.0000 mg | SUBCUTANEOUS | Status: DC
  Administered 2019-03-09 – 2019-03-11 (×3): 40 mg via SUBCUTANEOUS
  Filled 2019-03-09 (×4): qty 0.4

## 2019-03-09 MED ORDER — SENNA 8.6 MG PO TABS: 1.0000 | ORAL_TABLET | Freq: Every day | ORAL | Status: DC | PRN

## 2019-03-09 MED ORDER — OXYCODONE HCL 5 MG PO TABS
5.0000 mg | ORAL_TABLET | ORAL | Status: DC | PRN
  Administered 2019-03-10: 5 mg via ORAL
  Filled 2019-03-09: qty 1

## 2019-03-09 NOTE — Consult Note (Signed)
Referring Provider:  Dr. Fuller Plan  Primary Care Physician:  [name], MD Primary Gastroenterologist:  Dr. Paulita Fujita (seen once by him as outpatient to discuss colon cancer screening options, but never followed through on colonoscopy)  Reason for Consultation: Pancreatitis  HPI: Wanda Kane is a 58 y.o. female admitted to the hospital yesterday with pancreatitis, characterized by a lipase of 5000 and peripancreatic inflammatory changes on CT.  The patient has no prior history of pancreatitis.  She is a nondrinker.  Triglycerides are normal in the hospital.  CT did not show evidence of gallstones or pancreatic neoplasia.  There is no family history of biliary tract disease.  The patient is HIV positive and is on retroviral therapy but there have been no recent changes in medication.  The patient's history is that she had a several day prodrome of pain in her upper back and across her upper abdomen, culminating in nausea and vomiting which prompted an ER visit 3 nights ago.  At that time, lipase was 4812, liver chemistries were normal, and the above-mentioned CT was obtained.  At the patient's request, she went home to try to manage as an outpatient with bowel rest and pain medication, but due to ongoing symptoms, her ID specialist, Dr. Tommy Medal, arrange for direct admission to the hospital which was accomplished today.  The patient feels that, at this point, her pain is about 50% improved, and in just 48 hours, her lipase has dropped from almost 5000 to normal.  She is on a clear liquid diet.   Past Medical History:  Diagnosis Date  . DVT (deep venous thrombosis) ([place]) Jan 2016  . Fibroids 2010  . Genital herpes 08/12/2017  . HIV infection ([place]) 1994  . Hypertension 1994  . Medical history non-contributory   . 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 Pancreatitis 03/09/2019  . Pre-diabetes   . Type 2 diabetes mellitus with hyperglycemia ([place]) 03/03/2019  . Vaginal bleeding 12/2014   due to  xarelto    Past Surgical History:  Procedure Laterality Date  . CESAREAN SECTION  1983  . CESAREAN SECTION    . TONSILLECTOMY AND ADENOIDECTOMY      Prior to Admission medications   Medication Sig Start Date End Date Taking? Authorizing Provider  acetaminophen (TYLENOL) 500 MG tablet Take 1,000 mg by mouth every 6 (six) hours as needed for mild pain, moderate pain or headache.    [provider]  amLODipine (NORVASC) 10 MG tablet TAKE 1 TABLET (10 MG TOTAL) BY MOUTH DAILY. 11/26/18   [name], MD  B-Complex-C TABS Take 1 tablet by mouth daily.    [provider]  Blood Glucose Monitoring Suppl (TRUE METRIX METER) w/Device KIT Check sugar once a day Patient not taking: Reported on 03/09/2019 12/21/18   [name], MD  ciclopirox [facility]) 8 % solution Apply topically at bedtime. Apply over nail QHS.  Apply  Daily over previous coat. After (7) days, may remove with alcohol and continue cycle. Patient not taking: Reported on 03/07/2019 01/01/18   [name], MD  glucose blood (TRUE METRIX BLOOD GLUCOSE TEST) test strip Check sugar once a day Patient not taking: Reported on 03/09/2019 12/21/18   [name], MD  HYDROcodone-acetaminophen (NORCO/VICODIN) 5-325 MG tablet Take 1 tablet by mouth every 6 (six) hours as needed for severe pain. Patient not taking: Reported on 03/09/2019 03/07/19   Ripley Fraise, MD  JULUCA 50-25 MG TABS TAKE 1 TABLET BY MOUTH DAILY. Patient not taking:  No sig reported 01/21/19   Tommy Medal, Lavell Islam, MD  lisinopril (PRINIVIL,ZESTRIL) 10 MG tablet TAKE 1 TABLET (10 MG TOTAL) BY MOUTH DAILY. 11/26/18   [name], MD  Multiple Vitamin (MULTIVITAMIN) capsule Take 1 capsule by mouth daily.    [provider]  promethazine (PHENERGAN) 25 MG tablet Take 1 tablet (25 mg total) by mouth every 8 (eight) hours as needed for nausea or vomiting. Patient not taking: Reported on 03/09/2019 03/07/19   Ripley Fraise, MD   spironolactone (ALDACTONE) 100 MG tablet TAKE 1 TABLET (100 MG TOTAL) BY MOUTH DAILY. Patient not taking: Reported on 03/09/2019 11/26/18   [name], MD  terbinafine (LAMISIL) 250 MG tablet TAKE 1 TABLET BY MOUTH DAILY. Patient not taking: No sig reported 02/21/19   [name], MD  TRUEPLUS LANCETS 28G MISC Check sugar once a day Patient not taking: Reported on 03/09/2019 12/21/18   [name], MD    Current Facility-Administered Medications  Medication Dose Route Frequency Provider Last Rate Last Dose  . acetaminophen (TYLENOL) tablet 650 mg  650 mg Oral Q6H PRN Norval Morton, MD       Or  . acetaminophen (TYLENOL) suppository 650 mg  650 mg Rectal Q6H PRN Smith, Rondell A, MD      . albuterol (PROVENTIL) (2.5 MG/3ML) 0.083% nebulizer solution 2.5 mg  2.5 mg Nebulization Q6H PRN Smith, Rondell A, MD      . amLODipine (NORVASC) tablet 10 mg  10 mg Oral Daily Tamala Julian, Rondell A, MD   10 mg at 03/09/19 1423  . enoxaparin (LOVENOX) injection 40 mg  40 mg Subcutaneous Q24H Smith, Rondell A, MD   40 mg at 03/09/19 1423  . insulin aspart (novoLOG) injection 0-9 Units  0-9 Units Subcutaneous TID WC Norval Morton, MD   2 Units at 03/09/19 1748  . lactated ringers infusion   Intravenous Continuous Fuller Plan A, MD 150 mL/hr at 03/09/19 1748    . morphine 2 MG/ML injection 2-4 mg  2-4 mg Intravenous Q3H PRN Tamala Julian, Rondell A, MD      . ondansetron (ZOFRAN) tablet 4 mg  4 mg Oral Q6H PRN Fuller Plan A, MD       Or  . ondansetron (ZOFRAN) injection 4 mg  4 mg Intravenous Q6H PRN Smith, Rondell A, MD      . oxyCODONE (Oxy IR/ROXICODONE) immediate release tablet 5 mg  5 mg Oral Q4H PRN Smith, Rondell A, MD      . sodium chloride flush (NS) 0.9 % injection 3 mL  3 mL Intravenous Q12H Fuller Plan A, MD        Allergies as of 03/09/2019 - Review Complete 03/09/2019  Allergen Reaction Noted  . Pepcid [famotidine] Other (See Comments) 08/12/2017  . Prilosec [omeprazole]  Other (See Comments) 08/12/2017  . Viread [tenofovir disoproxil] Other (See Comments) 10/21/2012    Family History  Problem Relation Age of Onset  . Hypertension Mother   . CAD Brother   . Heart disease Brother 87       cardiac arrest    Social History   Socioeconomic History  . Marital status: Single    Spouse name: Not on file  . Number of children: Not on file  . Years of education: college, [place]  . Highest education level: Not on file  Occupational History  . Occupation: Research scientist (life sciences): [employer]  Social Needs  . Financial resource strain: Not hard  at all  . Food insecurity:    Worry: Never true    Inability: Never true  . Transportation needs:    Medical: No    Non-medical: No  Tobacco Use  . Smoking status: Never Smoker  . Smokeless tobacco: Never Used  Substance and Sexual Activity  . Alcohol use: No  . Drug use: No  . Sexual activity: Not Currently    Partners: Male    Comment: patient declined condoms  Lifestyle  . Physical activity:    Days per week: Patient refused    Minutes per session: Patient refused  . Stress: To some extent  Relationships  . Social connections:    Talks on phone: Patient refused    Gets together: Patient refused    Attends religious service: Patient refused    Active member of club or organization: Patient refused    Attends meetings of clubs or organizations: Patient refused    Relationship status: Patient refused  . Intimate partner violence:    Fear of current or ex partner: No    Emotionally abused: No    Physically abused: No    Forced sexual activity: No  Other Topics Concern  . 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   She loves her job   Has BSW   Working towards Research officer, political party    Review of Systems: Negative for chest pain, shortness of breath or respiratory symptoms, skin rashes or arthropathy (except for some right elbow pain attributed to holding her phone  for prolonged periods), urinary symptoms, lower extremity edema, stomatitis, chronic GI symptoms such as reflux or constipation or diarrhea.  As noted above, she has never had colonoscopy, having not followed through on outpatient plans to have that exam.  However, she expresses an openness to considering that test in the future once the COVID epidemic is subsided.  Physical Exam: Vital signs in last 24 hours: Temp:  [98.8 F (37.1 C)-99.5 F (37.5 C)] 98.8 F (37.1 C) (04/22 1751) Pulse Rate:  [111-115] 111 (04/22 1751) Resp:  [18] 18 (04/22 1751) BP: (118-144)/(74-82) 144/82 (04/22 1751) SpO2:  [95 %] 95 % (04/22 1751) Weight:  [104.8 kg] 104.8 kg (04/22 1135) Last BM Date: 03/05/19 General:   Alert, severely overweight, pleasant and cooperative in NAD Head:  Normocephalic and atraumatic. Eyes:  Sclera clear, no icterus.    Mouth:   No ulcerations or lesions.  Oropharynx pink & moist. Lungs:  Clear throughout to auscultation.   No wheezes, crackles, or rhonchi. No evident respiratory distress. Heart:   Regular rate and rhythm; no murmurs, clicks, rubs,  or gallops. Abdomen:  Soft, nontender, nontympanitic, and nondistended. No masses, hepatosplenomegaly or ventral hernias noted.  Quiet bowel sounds, without bruits, guarding, or rebound.   Msk:   Symmetrical without gross deformities. Extremities:   Without edema. Neurologic:  Alert and coherent;  grossly normal neurologically. Skin:  Intact without significant lesions or rashes. Psych:   Alert and cooperative. Normal mood and affect.  Intake/Output from previous day: No intake/output data recorded. Intake/Output this shift: Total I/O In: 1404 [P.O.:420; IV Piggyback:984] Out: 900 [Urine:900]  Lab Results: Recent Labs    03/07/19 0332 03/09/19 1213  WBC 18.5* 26.7*  HGB 14.4 14.1  HCT 44.1 41.7  PLT 292 244   BMET Recent Labs    03/07/19 0332 03/09/19 1213  NA 139 135  K 3.7 3.5  CL 104 93*  CO2 24 27  GLUCOSE  235* 181*  BUN 16 9  CREATININE 0.87 0.98  CALCIUM 9.1 9.9   LFT Recent Labs    03/09/19 1213  PROT 7.7  ALBUMIN 3.6  AST 29  ALT 22  ALKPHOS 79  BILITOT 1.5*   PT/INR No results for input(s): LABPROT, INR in the last 72 hours.  Studies/Results: Dg Abd Acute 2+v W 1v Chest  Result Date: 03/09/2019 CLINICAL DATA:  58 year old female with pancreatitis EXAM: DG ABDOMEN ACUTE W/ 1V CHEST COMPARISON:  CT 03/07/2019, plain film 07/28/2015 FINDINGS: Cardiomediastinal silhouette unchanged in size and contour. No evidence of central vascular congestion. No interlobular septal thickening. No pneumothorax or pleural effusion. Linear opacities at the left lung base likely atelectasis with no confluent airspace disease. Gas within stomach small bowel and colon. No abnormal distention. No radiopaque foreign body. IVC filter in place. No unexpected soft tissue density or calcification. IMPRESSION: Chest: No radiographic evidence of acute cardiopulmonary disease. Abdomen: Normal bowel gas pattern. Electronically Signed   By: [name] D.O.   On: 03/09/2019 15:17    Impression: Acute pancreatitis of unclear etiology.  Clinically, fairly mild, which would go along with a drug-induced etiology.  It is sometimes the case that patients will develop pancreatitis from medication that they have been on for a long time, and I do think that is the most likely explanation in this patient's case.  On the other hand, the rather impressive rise in lipase, with prompt normalization, it is frequently seen with biliary pancreatitis.  This would also go along with the prodromal upper tract symptoms including the pain in the back.  Typically, patients will have elevated transaminases in association with biliary pancreatitis, but since this patient was symptomatic for several days prior to coming to the emergency room, it is conceivable that the transaminases had had a chance to normalize prior to her labs being drawn.   The fact that no gallstones were seen on CT scan does not exclude gallstones since the CT is not particularly accurate for gallbladder stones, 85% of which are noncalcified.  Plan: 1.  Abdominal ultrasound to check for gallstones.  If gallstones are present, I would further evaluate the common duct with an MRCP.  If no gallstones or sludge are present, I do not think that further GI tract evaluation would be needed, although an MRCP to check for pancreas divisum could be considered, especially if there was any hesitancy in changing the patient's antiviral drug regiment.  Pancreas divisum is a risk factor for pancreatitis and would offer an alternative explanation for this patient's occurrence of pancreatitis, other than her medications.  2.  Okay to advance to low-fat diet (ordered)  3.  I will make a notation on this patient's chart at our office to be contacted for reconsideration of possible screening colonoscopy, versus other modalities for colon cancer screening.   LOS: 0 days   Youlanda Mighty Kumiko Fishman  03/09/2019, 6:01 PM   Pager [phone] If no answer or after 5 PM call [phone]

## 2019-03-09 NOTE — Progress Notes (Signed)
Chief complaint: Abdominal pain and bloating s Subjective:    Patient ID: Wanda Kane, female    DOB: 10-02-1961, 58 y.o.   MRN: [id number]  HPI  Wanda Kane is a 58 year-old [place] American lady with HIV previous he cared for [facility] and then moved to [place]. She had been on various regimens in 1990s and then on drug holiday. Later while in [place] she apparently was on various antiretroviral regimens lastly on Isentress and complera.   She moved back was seen by Dr. Baxter Kane in February. Her regimen was simplified to STRIBILD. She had tolerated this regimen without to much complaint other some nausea and loose stools.   However week prior to her seeing me in March 2014 she had  developed increasingly severe diffuse myalgias in her neck arms legs. She had similar symptoms in the past while out on antiretrovirals. She could not initially recall which antiretrovirals at apparently been blamed for the symptoms. However when I examined her allergy history he or she is listed as being allergic to tenofovir with alleged   tenofovir-induced rhabdomyolysis.  I  therefore crafted a new ARV regimen for her without TNF, namely once daily Tivicay, Edurant and Epivir. I  checked her for elevated CPK  Which was at 237. Since change to new TNF free regimen she had down titration of her CK to normal but then  back above 200 again.   She had initially not been seen by Rheum due to lack of insurance.  I offered further Rheum workup with labs was unrevealing  We discussed option of going to 2 drug regimen of JULUCA (DTG/RPV) She then made  the switch to [facility] but had been continuing her Lamivudine as well which is not what I was trying to trial with her, rather I was moving to treat her based on what JULUCA is FDA approved for with 2 drug regimen for stably suppressed pts.   She was definitely switched to Lomira alone and she was very happy with this regimen.   She has been perfectly suppressed on  this regimen.  I talked to her via an ED visit of the 16th 2020.  Apparently the next day but developed back pain with radiation into her abdomen.  She was wondering if this was due to her having a poor mattress and continue to have this pain.  This progressed into the weekend and then after eating takeout food on Sunday night suddenly developed severe abdominal pain with nausea and foamy emesis.  Her cramping abdominal pain and vomiting and persisted and she came to the emergency department at [facility].   It was a long she was diagnosed with pancreatitis with labs showing a lipase of thousand 812.  CT scan of the abdomen pelvis which showed acute inflammation of the pancreas and some inflammation of the adjacent duodenum.  There is not any mention of gallstones on the study.  The ER physician wanted to admit her but Wanda Kane had a nursing exam that she wanted to attend to that was that same morning on Monday.  ER physician speculated that this could be a drug-induced pancreatitis though drug-induced pancreatitis from her antiretrovirals is very rare as it is some of her other medications.  She did start Lamisil in the last several months the other new thing is she had tried to do a low-carb diet over the last 3 months and more recently had been eating more carbohydrates.  She does as mentioned above have  a history of myositis that was diagnosed in [place] and blamed on tenofovir but clearly was not due to tenofovir because she was on cidofovir for years.  She might have some type of autoimmune pathology  In any case while she was at home she did not improve and was not able to manage her pain with Vicodin nor her nausea with Phenergan.  I checked on her on Monday via telephone and again today via telephone.  I felt strongly she should come to the hospital for admission.  Here in the clinic her vital signs are stable other than a little bit of tachycardia.  She is completely nontoxic  appearing on exam.  She does not have any evidence of rebound or guarding but does still have abdominal pain.  She has not been having bowel movements but is passing gas.  I have called the hospitalist service to have arranged to tried hospitalist  She clearly needs further work-up such as an MRCP and gastroenterology consult.   Past Medical History:  Diagnosis Date  . DVT (deep venous thrombosis) ([place]) Jan 2016  . Fibroids 2010  . Genital herpes 08/12/2017  . HIV infection ([place]) 1994  . Hypertension 1994  . Pancreatitis 03/09/2019  . Type 2 diabetes mellitus with hyperglycemia ([place]) 03/03/2019  . Vaginal bleeding 12/2014   due to xarelto    Past Surgical History:  Procedure Laterality Date  . CESAREAN SECTION  1983  . CESAREAN SECTION    . TONSILLECTOMY AND ADENOIDECTOMY      Family History  Problem Relation Age of Onset  . Hypertension Mother   . CAD Brother   . Heart disease Brother 63       cardiac arrest      Social History   Socioeconomic History  . Marital status: Single    Spouse name: Not on file  . Number of children: Not on file  . Years of education: college, [place]  . Highest education level: Not on file  Occupational History  . Occupation: Research scientist (life sciences): [employer]  Social Needs  . Financial resource strain: Not on file  . Food insecurity:    Worry: Not on file    Inability: Not on file  . Transportation needs:    Medical: Not on file    Non-medical: Not on file  Tobacco Use  . Smoking status: Never Smoker  . Smokeless tobacco: Never Used  Substance and Sexual Activity  . Alcohol use: No  . Drug use: No  . Sexual activity: Not Currently    Partners: Male    Comment: patient declined condoms  Lifestyle  . Physical activity:    Days per week: Not on file    Minutes per session: Not on file  . Stress: Not on file  Relationships  . Social connections:    Talks on phone: Not on file    Gets together: Not on file    Attends  religious service: Not on file    Active member of club or organization: Not on file    Attends meetings of clubs or organizations: Not on file    Relationship status: Not on file  Other Topics Concern  . 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   She loves her job   Has BSW   Working towards LPN and RN    Allergies  Allergen Reactions  . Pepcid [Famotidine] Other (See  Comments)  . Prilosec [Omeprazole] Other (See Comments)  . Viread [Tenofovir Disoproxil] Other (See Comments)    Rhabdomyolysis     Current Outpatient Medications:  .  acetaminophen (TYLENOL) 500 MG tablet, Take 1,000 mg by mouth every 6 (six) hours as needed for mild pain, moderate pain or headache., Disp: , Rfl:  .  amLODipine (NORVASC) 10 MG tablet, TAKE 1 TABLET (10 MG TOTAL) BY MOUTH DAILY., Disp: 90 tablet, Rfl: 0 .  B-Complex-C TABS, Take 1 tablet by mouth daily., Disp: , Rfl:  .  Blood Glucose Monitoring Suppl (TRUE METRIX METER) w/Device KIT, Check sugar once a day (Patient not taking: Reported on 03/09/2019), Disp: 1 kit, Rfl: 0 .  ciclopirox (PENLAC) 8 % solution, Apply topically at bedtime. Apply over nail QHS.  Apply  Daily over previous coat. After (7) days, may remove with alcohol and continue cycle. (Patient not taking: Reported on 03/07/2019), Disp: 6.6 mL, Rfl: 1 .  glucose blood (TRUE METRIX BLOOD GLUCOSE TEST) test strip, Check sugar once a day (Patient not taking: Reported on 03/09/2019), Disp: 100 each, Rfl: 12 .  HYDROcodone-acetaminophen (NORCO/VICODIN) 5-325 MG tablet, Take 1 tablet by mouth every 6 (six) hours as needed for severe pain. (Patient not taking: Reported on 03/09/2019), Disp: 10 tablet, Rfl: 0 .  JULUCA 50-25 MG TABS, TAKE 1 TABLET BY MOUTH DAILY. (Patient not taking: No sig reported), Disp: 30 tablet, Rfl: 1 .  lisinopril (PRINIVIL,ZESTRIL) 10 MG tablet, TAKE 1 TABLET (10 MG TOTAL) BY MOUTH DAILY., Disp: 90 tablet, Rfl: 0 .  Multiple Vitamin  (MULTIVITAMIN) capsule, Take 1 capsule by mouth daily., Disp: , Rfl:  .  promethazine (PHENERGAN) 25 MG tablet, Take 1 tablet (25 mg total) by mouth every 8 (eight) hours as needed for nausea or vomiting. (Patient not taking: Reported on 03/09/2019), Disp: 10 tablet, Rfl: 0 .  spironolactone (ALDACTONE) 100 MG tablet, TAKE 1 TABLET (100 MG TOTAL) BY MOUTH DAILY., Disp: 90 tablet, Rfl: 0 .  terbinafine (LAMISIL) 250 MG tablet, TAKE 1 TABLET BY MOUTH DAILY. (Patient not taking: No sig reported), Disp: 30 tablet, Rfl: 0 .  TRUEPLUS LANCETS 28G MISC, Check sugar once a day (Patient not taking: Reported on 03/09/2019), Disp: 100 each, Rfl: 12    Review of Systems  Constitutional: Positive for appetite change, diaphoresis and fatigue. Negative for activity change, chills, fever and unexpected weight change.  HENT: Negative for congestion, rhinorrhea, sinus pressure, sneezing, sore throat and trouble swallowing.   Eyes: Negative for photophobia and visual disturbance.  Respiratory: Negative for cough, chest tightness, shortness of breath, wheezing and stridor.   Cardiovascular: Negative for chest pain, palpitations and leg swelling.  Gastrointestinal: Positive for abdominal distention, abdominal pain, nausea and vomiting. Negative for anal bleeding, blood in stool, constipation and diarrhea.  Genitourinary: Negative for difficulty urinating, dysuria, flank pain and hematuria.  Musculoskeletal: Positive for back pain. Negative for arthralgias, gait problem, joint swelling, myalgias and neck stiffness.  Skin: Positive for rash. Negative for color change, pallor and wound.  Neurological: Negative for dizziness, tremors, weakness and light-headedness.  Hematological: Negative for adenopathy. Does not bruise/bleed easily.  Psychiatric/Behavioral: Negative for agitation, behavioral problems, confusion, decreased concentration, dysphoric mood and sleep disturbance.       Objective:   Physical Exam   Constitutional: She is oriented to person, place, and time. She appears well-developed and well-nourished. No distress.  HENT:  Head: Normocephalic and atraumatic.  Mouth/Throat: Oropharynx is clear and moist. No oropharyngeal exudate.  Eyes: Conjunctivae  and EOM are normal. No scleral icterus.  Neck: Normal range of motion. Neck supple. No JVD present.  Cardiovascular: Regular rhythm and normal heart sounds. Tachycardia present. Exam reveals no gallop and no friction rub.  No murmur heard. Pulmonary/Chest: Effort normal and breath sounds normal. No respiratory distress. She has no wheezes. She has no rales. She exhibits no tenderness.  Abdominal: She exhibits distension. She exhibits no mass. There is abdominal tenderness. There is no rebound and no guarding.  Musculoskeletal:        General: No tenderness or edema.  Lymphadenopathy:    She has no cervical adenopathy.  Neurological: She is alert and oriented to person, place, and time. She exhibits normal muscle tone. Coordination normal.  Skin: Skin is warm and dry. She is not diaphoretic. No erythema. No pallor.  Psychiatric: She has a normal mood and affect. Her behavior is normal. Judgment and thought content normal.       Assessment & Plan:    Pancreatitis:  I think this needs further work-up and clearly she is not being able to manage this at home.  She looks better now in person than she was over the phone but that is just one time point.  I think she clearly needs a gastroenterology consult.  I wonder if an MRCP would be informative.  Her odd history of having had myositis in the past makes me wonder if she could have autoimmune phenomena or if this could be idiopathic.  Certainly if it is strongly felt that the pancreatitis is due to her antiretroviral regimen we can try to construct a new one.  I would find it odd that she would have abrupt pancreatitis to either an integrates strand transfer inhibitor or a  non-nucleoside reverse transcriptase inhibitor suddenly.  She has been on integrase Rona Ravens transfer inhibitors for many many years going back to when she was in [place].  She has been on the road to green for several years here.  HIV disease: Quite perfectly controlled again I am willing to construct a new regimen if needed  Onychomycosis: Would hold Lamisil since this is not an absolutely necessary medication and there has been some described cases of it causing pancreatitis     HIV: c Myositis: not clear wha had been  causing this but she is symptomatically better TNF, probably NEVER had anything to do with   HTN: BP worse she will followup with PCP  There were no vitals filed for this visit.   HSV: no recent problems

## 2019-03-09 NOTE — Telephone Encounter (Signed)
Per Dr. Tommy Medal called [facility] bed control to have patient admitted. Spoke with Maudie Mercury, Admissions who states patient can check in at admissions on 72M at [facility]. Patient was informed and will report to admissions. Admitting MD. Dr. Fuller Plan Aundria Rud, [place]

## 2019-03-09 NOTE — Progress Notes (Signed)
Virtual Visit via Telephone Note  I connected with Wanda Kane on 03/09/19 at 10:15 AM EDT by telephone and verified that I am speaking with the correct person using two identifiers.   I discussed the limitations, risks, security and privacy concerns of performing an evaluation and management service by telephone and the availability of in person appointments. I also discussed with the patient that there may be a patient responsible charge related to this service. The patient expressed understanding and agreed to proceed.   History of Present Illness:  When I called Wanda Kane this morning her abdominal pain was not improving with bowel rest fluids and attempts to control the pain with Vicodin.  I felt concerned about this and urged her to come to the clinic in person so that he could arrange a direct admission.   Observations/Objective:  Pancreatitis which is not resolving with conservative therapies at home: Bring her to clinic to have her admitted  Think she may need more work done such as an MRCP in the hospital  Heavy disease has been well controlled I am skeptical of JULUCA being the culprit of her pancreatitis  Assessment and Plan:  Pancreatitis: We will arrange to have her come to clinic however to call the hospitalist to have her directly admitted provider vital signs are stable I think she needs to be seen by gastroenterology and have an MRCP.  It is conceivable that JULUCA could have caused this but seems unlikely given she has been on components of this drug for years.  HIV disease: We can certainly craft a new regimen for her but I would like to have her pancreatitis further investigated first.  Follow Up Instructions:    I discussed the assessment and treatment plan with the patient. The patient was provided an opportunity to ask questions and all were answered. The patient agreed with the plan and demonstrated an understanding of the instructions.   The patient was  advised to call back or seek an in-person evaluation if the symptoms worsen or if the condition fails to improve as anticipated.  I provided 5  minutes of non-face-to-face time during this encounter.   Alcide Evener, MD

## 2019-03-09 NOTE — H&P (Addendum)
History and Physical    Wanda Kane DSK:[id number] DOB: 03-23-1961 DOA: 03/09/2019  Referring MD/NP/PA: Dr. Drucilla Schmidt PCP: [name], MD  Patient coming from: ID clinic  Chief Complaint: Abdominal pain  I have personally briefly reviewed patient's old medical records in [place]   HPI: Wanda Kane is a 58 y.o. female with medical history significant of HTN, HLD, DM type II, HIV, and obesity; who presents with complaints of epigastric abdominal pain.  Patient had just been seen in the emergency department on 4/20.  Noted to have associated symptoms of nausea and vomiting with lipase of 4812.  CT imaging obtained at that time revealed acute pancreatitis without signs of necrosis.  Patient denied any use of alcohol or illicit drugs.  Triglycerides noted to be within normal limits at 119.  Medications reviewed and symptoms thought possibly related with her antiretroviral medication of Juluca.  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  She tried management of her symptoms at home.  Reports being able to tolerate liquids without any problem, but pain was uncontrolled despite taking hydrocodone and Motrin.  Motrin seemed to help symptoms better than oxycodone.  Denies having any further vomiting, but has not had a bowel movement in the last 4 days.  Denies any complaints of fever.  No family history of pancreatitis.  The only new medication that was started last 2 months was Lamisil for toe fungus. Diagnosed with diabetes in January of this year, but wanted to try diet changes and exercise prior to being started on medication.  Patient reports that she was doing good up until the state home orders.   ED Course: Consulted by Dr. Tommy Medal of infectious disease to admit the patient for pancreatitis unable to be managed in outpatient setting. Does not think it is from [place] and request GI be consult along with obtain ERCP.   She has an appointment in his office today.  Will accepted as a direct admit to a MedSurg bed.  Review of Systems  Constitutional: Negative for diaphoresis and fever.  HENT: Negative for congestion and nosebleeds.   Eyes: Negative for photophobia and pain.  Respiratory: Negative for cough and shortness of breath.   Cardiovascular: Negative for chest pain and leg swelling.  Gastrointestinal: Positive for abdominal pain, constipation and nausea. Negative for diarrhea and vomiting.  Genitourinary: Negative for dysuria and frequency.  Musculoskeletal: Negative for falls and neck pain.  Skin: Negative for itching and rash.  Neurological: Negative for focal weakness and loss of consciousness.  Endo/Heme/Allergies: Does not bruise/bleed easily.  Psychiatric/Behavioral: Negative for memory loss and substance abuse.    Past Medical History:  Diagnosis Date  . DVT (deep venous thrombosis) ([place]) Jan 2016  . Fibroids 2010  . Genital herpes 08/12/2017  . HIV infection ([place]) 1994  . Hypertension 1994  . 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 Pancreatitis 03/09/2019  . Type 2 diabetes mellitus with hyperglycemia ([place]) 03/03/2019  . Vaginal bleeding 12/2014   due to xarelto    Past Surgical History:  Procedure Laterality Date  . CESAREAN SECTION  1983  . CESAREAN SECTION    . TONSILLECTOMY AND ADENOIDECTOMY       reports that she has never smoked. She has never used smokeless tobacco. She reports that she does not drink alcohol or use drugs.  Allergies  Allergen Reactions  . Pepcid [Famotidine] Other (See Comments)  . Prilosec [Omeprazole] Other (See Comments)  .  Viread [Tenofovir Disoproxil] Other (See Comments)    Rhabdomyolysis    Family History  Problem Relation Age of Onset  . Hypertension Mother   . CAD Brother   . Heart disease Brother 3       cardiac arrest    Prior to Admission medications   Medication Sig Start Date End Date Taking? Authorizing Provider  acetaminophen (TYLENOL) 500 MG  tablet Take 1,000 mg by mouth every 6 (six) hours as needed for mild pain, moderate pain or headache.    [provider]  amLODipine (NORVASC) 10 MG tablet TAKE 1 TABLET (10 MG TOTAL) BY MOUTH DAILY. 11/26/18   [name], MD  B-Complex-C TABS Take 1 tablet by mouth daily.    [provider]  Blood Glucose Monitoring Suppl (TRUE METRIX METER) w/Device KIT Check sugar once a day Patient not taking: Reported on 03/09/2019 12/21/18   [name], MD  ciclopirox [facility]) 8 % solution Apply topically at bedtime. Apply over nail QHS.  Apply  Daily over previous coat. After (7) days, may remove with alcohol and continue cycle. Patient not taking: Reported on 03/07/2019 01/01/18   [name], MD  glucose blood (TRUE METRIX BLOOD GLUCOSE TEST) test strip Check sugar once a day Patient not taking: Reported on 03/09/2019 12/21/18   [name], MD  HYDROcodone-acetaminophen (NORCO/VICODIN) 5-325 MG tablet Take 1 tablet by mouth every 6 (six) hours as needed for severe pain. Patient not taking: Reported on 03/09/2019 03/07/19   Ripley Fraise, MD  JULUCA 50-25 MG TABS TAKE 1 TABLET BY MOUTH DAILY. Patient not taking: No sig reported 01/21/19   Tommy Medal, Lavell Islam, MD  lisinopril (PRINIVIL,ZESTRIL) 10 MG tablet TAKE 1 TABLET (10 MG TOTAL) BY MOUTH DAILY. 11/26/18   [name], MD  Multiple Vitamin (MULTIVITAMIN) capsule Take 1 capsule by mouth daily.    [provider]  promethazine (PHENERGAN) 25 MG tablet Take 1 tablet (25 mg total) by mouth every 8 (eight) hours as needed for nausea or vomiting. Patient not taking: Reported on 03/09/2019 03/07/19   Ripley Fraise, MD  spironolactone (ALDACTONE) 100 MG tablet TAKE 1 TABLET (100 MG TOTAL) BY MOUTH DAILY. Patient not taking: Reported on 03/09/2019 11/26/18   [name], MD  terbinafine (LAMISIL) 250 MG tablet TAKE 1 TABLET BY MOUTH DAILY. Patient not taking: No sig reported 02/21/19   [name], MD  TRUEPLUS LANCETS 28G MISC Check sugar once a day Patient not taking: Reported on 03/09/2019 12/21/18   [name], MD    Physical Exam:  Constitutional: Obese female who appears to be in no acute distress at this time Vitals:   03/09/19 1131  BP: 127/74  Pulse: (!) 115  Resp: 18  Temp: 98.8 F (37.1 C)  TempSrc: Oral  SpO2: 95%   Eyes: PERRL, lids and conjunctivae normal ENMT: Mucous membranes are moist. Posterior pharynx clear of any exudate or lesions. Normal dentition.  Neck: normal, supple, no masses, no thyromegaly Respiratory: clear to auscultation bilaterally, no wheezing, no crackles. Normal respiratory effort. No accessory muscle use.  Cardiovascular: Tachycardic, no murmurs / rubs / gallops. No extremity edema. 2+ pedal pulses. No carotid bruits.  Abdomen: Generalized tenderness to palpation, no masses palpated. No hepatosplenomegaly. Bowel sounds positive.   Musculoskeletal: no clubbing / cyanosis. No joint deformity upper and lower extremities. Good ROM, no contractures. Normal muscle tone.  Skin: no rashes, lesions, ulcers. No induration Neurologic: CN 2-12 grossly intact.  Sensation intact, DTR normal. Strength 5/5 in all 4.  Psychiatric: Normal judgment and insight. Alert and oriented x 3. Normal mood.     Labs on Admission: I have personally reviewed following labs and imaging studies  CBC: Recent Labs  Lab 03/07/19 0332  WBC 18.5*  NEUTROABS 14.0*  HGB 14.4  HCT 44.1  MCV 90.4  PLT 867   Basic Metabolic Panel: Recent Labs  Lab 03/07/19 0332  NA 139  K 3.7  CL 104  CO2 24  GLUCOSE 235*  BUN 16  CREATININE 0.87  CALCIUM 9.1   GFR: CrCl cannot be calculated (Unknown ideal weight.). Liver Function Tests: Recent Labs  Lab 03/07/19 0332  AST 33  ALT 24  ALKPHOS 106  BILITOT 0.7  PROT 7.5  ALBUMIN 4.0   Recent Labs  Lab 03/07/19 0332  LIPASE 4,812*   No results for input(s): AMMONIA in the last 168 hours. Coagulation  Profile: No results for input(s): INR, PROTIME in the last 168 hours. Cardiac Enzymes: No results for input(s): CKTOTAL, CKMB, CKMBINDEX, TROPONINI in the last 168 hours. BNP (last 3 results) No results for input(s): PROBNP in the last 8760 hours. HbA1C: No results for input(s): HGBA1C in the last 72 hours. CBG: No results for input(s): GLUCAP in the last 168 hours. Lipid Profile: Recent Labs    03/07/19 0618  CHOL 241*  HDL 52  LDLCALC 165*  TRIG 119  CHOLHDL 4.6   Thyroid Function Tests: No results for input(s): TSH, T4TOTAL, FREET4, T3FREE, THYROIDAB in the last 72 hours. Anemia Panel: No results for input(s): VITAMINB12, FOLATE, FERRITIN, TIBC, IRON, RETICCTPCT in the last 72 hours. Urine analysis:    Component Value Date/Time   COLORURINE YELLOW 06/15/2015 0524   APPEARANCEUR CLEAR 06/15/2015 0524   LABSPEC 1.020 07/10/2017 2026   PHURINE 5.0 07/10/2017 2026   GLUCOSEU 100 (A) 07/10/2017 2026   HGBUR LARGE (A) 07/10/2017 2026   BILIRUBINUR neg 07/13/2017 1647   KETONESUR 15 (A) 07/10/2017 2026   PROTEINUR 78m 07/13/2017 1647   PROTEINUR 100 (A) 07/10/2017 2026   UROBILINOGEN 1.0 07/13/2017 1647   UROBILINOGEN 2.0 (H) 07/10/2017 2026   NITRITE neg 07/13/2017 1647   NITRITE POSITIVE (A) 07/10/2017 2026   LEUKOCYTESUR Trace (A) 07/13/2017 1647   Sepsis Labs: No results found for this or any previous visit (from the past 240 hour(s)).   Radiological Exams on Admission: No results found.  EKG: Independently reviewed. Sinus tachy with RBBB and QTc 492  Assessment/Plan 1.  Pancreatitis: Acute.  Complaining of abdominal pain.  Lipase 4812 without elevated liver enzymes.  CT showing acute pancreatitis and secondary inflammation in the duodenum, but without signs of necrosis on 4/20.  Deines alcohol use. Hypertriglyceridemia and hypercalcemia ruled out as a possible cause as symptoms. Medication induced pancreatitis is rare called for pancreatitis could include  lisinopril, spironolactone, and Juluca.  Suspecting possible gallstone pancreatitis although no acute rise in liver enzymes versus idiopathic cause. -Advance diet to heart healthy -Incentive spirometry -Check acute abdominal series(no acute abnormalities with normal gas pattern) -Lactated Ringer's give 1 L bolus then placed on rate 150 mL/h as tolerated -Recheck lipase and cmp(lipase 34 and total bilirubin 1.5)  -oxycodone/IV morphine for pain control -Dr. BCristina Gongof gastroenterology consulted, checking right upper quadrant ultrasound -May warrent MRCP if gallstones appreciated on ultrasound  2.  HIV: Patient on antiretroviral therapy of Juluca.  On 3/24 CD4 T cell antibodies 860(22 %), HIV RNA quantitative 36, HIV RNA quantitative log 1.56  copies/ml.  Patient followed in the outpatient setting by Dr.  Drucilla Schmidt. [name] when medically appropriate  3.   Leukocytosis: WBC acutely worsening from 18.5-> 26.7.  Patient remains afebrile.  Suspect related with pancreatitis -Continue to monitor  4.  Essential hypertension: Patient reportedly only taking amlodipine and lisinopril at home.  No longer taking spironolactone.  Blood pressures relatively controlled -Held lisinopril restarted when medically appropriate -Continue amlodipine  5.  Diabetes mellitus type 2: Last hemoglobin A1c noted to be 8.1 on 11/19/2018.  Patient had tried making dietary changes prior to being started on medication -Hypoglycemic protocol -Check hemoglobin A1c in a.m. -CBGs with sensitive SSI   5.  History of DVT , s/p IVC filter: Placed After having complications of bleeding on anticoagulation in 12/2014.  6.  Constipation: No bowel movement in the last 4 days.  Acute abdominal series showed no acute abnormalities. -Continue to monitor  7.  Sinus tachycardia, borderline QTc :acute.  QTc 497 suspect related with above. -Check EKG  8.  Obesity: BMI 39.66 kg/m  DVT prophylaxis: Lovenox Code Status: Full Family  Communication: No family present. Disposition Plan: Likely discharge home in 1 to 2 days Consults called: Gastroenterology Admission status: Observation  Norval Morton MD [facility] Pager [phone]   If 7PM-7AM, please contact night-coverage [email or web address] Password [email or web address]  03/09/2019, 12:00 PM

## 2019-03-10 ENCOUNTER — Observation Stay (HOSPITAL_COMMUNITY): Payer: No Typology Code available for payment source

## 2019-03-10 DIAGNOSIS — Z8241 Family history of sudden cardiac death: Secondary | ICD-10-CM | POA: Diagnosis not present

## 2019-03-10 DIAGNOSIS — T50995A Adverse effect of other drugs, medicaments and biological substances, initial encounter: Secondary | ICD-10-CM | POA: Diagnosis present

## 2019-03-10 DIAGNOSIS — Z21 Asymptomatic human immunodeficiency virus [HIV] infection status: Secondary | ICD-10-CM | POA: Diagnosis present

## 2019-03-10 DIAGNOSIS — K859 Acute pancreatitis without necrosis or infection, unspecified: Secondary | ICD-10-CM | POA: Diagnosis not present

## 2019-03-10 DIAGNOSIS — Z95828 Presence of other vascular implants and grafts: Secondary | ICD-10-CM | POA: Diagnosis not present

## 2019-03-10 DIAGNOSIS — K853 Drug induced acute pancreatitis without necrosis or infection: Secondary | ICD-10-CM | POA: Diagnosis present

## 2019-03-10 DIAGNOSIS — Z6839 Body mass index (BMI) 39.0-39.9, adult: Secondary | ICD-10-CM | POA: Diagnosis not present

## 2019-03-10 DIAGNOSIS — E1165 Type 2 diabetes mellitus with hyperglycemia: Secondary | ICD-10-CM | POA: Diagnosis present

## 2019-03-10 DIAGNOSIS — Z8249 Family history of ischemic heart disease and other diseases of the circulatory system: Secondary | ICD-10-CM | POA: Diagnosis not present

## 2019-03-10 DIAGNOSIS — K760 Fatty (change of) liver, not elsewhere classified: Secondary | ICD-10-CM | POA: Diagnosis present

## 2019-03-10 DIAGNOSIS — Z888 Allergy status to other drugs, medicaments and biological substances status: Secondary | ICD-10-CM | POA: Diagnosis not present

## 2019-03-10 DIAGNOSIS — Y929 Unspecified place or not applicable: Secondary | ICD-10-CM | POA: Diagnosis not present

## 2019-03-10 DIAGNOSIS — K5900 Constipation, unspecified: Secondary | ICD-10-CM | POA: Diagnosis present

## 2019-03-10 DIAGNOSIS — E782 Mixed hyperlipidemia: Secondary | ICD-10-CM | POA: Diagnosis present

## 2019-03-10 DIAGNOSIS — I10 Essential (primary) hypertension: Secondary | ICD-10-CM | POA: Diagnosis present

## 2019-03-10 DIAGNOSIS — Z79899 Other long term (current) drug therapy: Secondary | ICD-10-CM | POA: Diagnosis not present

## 2019-03-10 DIAGNOSIS — E876 Hypokalemia: Secondary | ICD-10-CM | POA: Diagnosis present

## 2019-03-10 DIAGNOSIS — Z86718 Personal history of other venous thrombosis and embolism: Secondary | ICD-10-CM | POA: Diagnosis not present

## 2019-03-10 LAB — GLUCOSE, CAPILLARY
Glucose-Capillary: 159 mg/dL — ABNORMAL HIGH (ref 70–99)
Glucose-Capillary: 146 mg/dL — ABNORMAL HIGH (ref 70–99)
Glucose-Capillary: 137 mg/dL — ABNORMAL HIGH (ref 70–99)
Glucose-Capillary: 147 mg/dL — ABNORMAL HIGH (ref 70–99)
Glucose-Capillary: 168 mg/dL — ABNORMAL HIGH (ref 70–99)
Glucose-Capillary: 133 mg/dL — ABNORMAL HIGH (ref 70–99)

## 2019-03-10 LAB — BASIC METABOLIC PANEL
Anion gap: 13 (ref 5–15)
BUN: 6 mg/dL (ref 6–20)
CO2: 25 mmol/L (ref 22–32)
Calcium: 9.2 mg/dL (ref 8.9–10.3)
Chloride: 96 mmol/L — ABNORMAL LOW (ref 98–111)
Creatinine, Ser: 0.76 mg/dL (ref 0.44–1.00)
GFR calc Af Amer: >60 mL/min (ref >60)
GFR calc non Af Amer: >60 mL/min (ref >60)
Glucose, Bld: 161 mg/dL — ABNORMAL HIGH (ref 70–99)
Potassium: 3.2 mmol/L — ABNORMAL LOW (ref 3.5–5.1)
Sodium: 134 mmol/L — ABNORMAL LOW (ref 135–145)

## 2019-03-10 LAB — HEMOGLOBIN A1C
Hgb A1c MFr Bld: 7.3 % — ABNORMAL HIGH (ref 4.8–5.6)
Mean Plasma Glucose: 162.81 mg/dL

## 2019-03-10 MED ORDER — GADOBUTROL 1 MMOL/ML IV SOLN
10.0000 mL | Freq: Once | INTRAVENOUS | Status: AC | PRN
  Administered 2019-03-10: 10 mL via INTRAVENOUS

## 2019-03-10 NOTE — Progress Notes (Signed)
(  Please see my note from earlier today).  Patient is now back from MRI scan.  No pancreas divisum, choledocholithiasis, or evidence of malignancy or other explanation for patient's pancreatitis, which I think is most likely from 1 of her medications, most likely one of her antiviral medications, although it could conceivably be "idiopathic" pancreatitis.  Above findings reviewed with patient.    Clinically, her pain is a little bit better today although she is still just taking liquids, not solid food, and does not have much of an appetite.    Recommendations:   1.  I would leave it to Dr. Derek Mound discretion as to whether or not to alter the patient's anti-HIV regimen, or simply observe and see if this becomes a recurring problem while remaining on the same medication.  2.  Would advance diet as tolerated.  3.  I will sign off at this time, but please feel free to call me if further questions arise.  No outpatient GI follow-up necessary, other than arrangements for eventual colon cancer screening, for which I have already notified my partner to contact the patient about, after the pandemic has settled down.  Cleotis Nipper, M.D. Pager [phone] If no answer or after 5 PM call [phone]

## 2019-03-10 NOTE — Progress Notes (Signed)
Patient is currently off the floor for an MRI scan of the pancreas with MRCP.  Abdominal ultrasound yesterday was negative for gallstones but did show hepatic steatosis, not surprising in view of the patient's obese body habitus, but not particularly worrisome, given normal liver chemistries and platelet count.  Per discussion w/ pt's nurse, pt is not taking much oral intake; she is having persistent mild-to-moderate abdominal pain but not requiring opiate analgesics, just Tylenol.  Will plan to see pt tomorrow--call in the meantime if questions.  Cleotis Nipper, M.D. Pager [phone] If no answer or after 5 PM call [phone]

## 2019-03-10 NOTE — Progress Notes (Signed)
[facility] PROGRESS NOTE  Wanda Kane ZOX:[id number] DOB: 10-Jul-1961 DOA: 03/09/2019 PCP: [name], MD  Assessment/Plan: 1.  Pancreatitis: Acute. Pain improved. CT showing acute pancreatitis and secondary inflammation in the duodenum, but without signs of necrosis on 4/20.  Deines alcohol use. Hypertriglyceridemia and hypercalcemia ruled out as a possible cause as symptoms. Front runners are medication induced pancreatitis vs pancreas divisim.   -MRCP -NPO for now -Incentive spirometry -Lactated Ringer's 150 mL/h as tolerated -pain control -appreciate GI assistance -will see results of MRCP if negative consider consulting ID for medication adjustment  2.  HIV: Patient on antiretroviral therapy of Juluca. On 3/24 CD4 T cell antibodies 860(22 %), HIV RNA quantitative 36, HIV RNA quantitative log 1.56 copies/ml.  Patient followed in the outpatient setting by Dr.  Drucilla Schmidt. [name] when medically appropriate  3.   Leukocytosis: WBC acutely worsening from 18.5-> 26.7.  Patient remains afebrile. Non toxic appearing. Suspect related with pancreatitis -Continue to monitor  4.  Essential hypertension: Patient reportedly only taking amlodipine and lisinopril at home.  No longer taking spironolactone.  Blood pressures relatively controlled -Held lisinopril restarted when medically appropriate -Continue amlodipine  5.  Diabetes mellitus type 2:  Poor control. A1c 7.3 which is improved from 12/19. -CBGs with sensitive SSI   5.  History of DVT , s/p IVC filter: Placed After having complications of bleeding on anticoagulation in 12/2014.  6.  Constipation: had bm this am. Normal in color and consistency.  Acute abdominal series showed no acute abnormalities. -Continue to monitor  7.  Sinus tachycardia. suspect related with above. ekg with same.  -improved pain control -TSH -monitor   8.  Obesity: BMI 39.66 kg/m   Code Status: full Family Communication:  none present Disposition Plan: home when ready   Consultants:  buccini gi  Procedures:  mrcp  Antibiotics:    HPI/Subjective: Admitted with acute pancreatitis. Etiology unclear. Pain controled. mrcp today. Gi on board  Objective: Vitals:   03/10/19 0525 03/10/19 0923  BP: (!) 143/79 (!) 153/85  Pulse: (!) 108 (!) 102  Resp: (!) 23 18  Temp: (!) 100.7 F (38.2 C) 99.5 F (37.5 C)  SpO2: 94% 96%    Intake/Output Summary (Last 24 hours) at 03/10/2019 1303 Last data filed at 03/10/2019 1200 Gross per 24 hour  Intake 5414.46 ml  Output 2200 ml  Net 3214.46 ml   Filed Weights   03/09/19 1135 03/09/19 2011  Weight: 104.8 kg 104.6 kg    Exam:   General:  Ambulating in room with steady gait. No acute distress  Cardiovascular: tachycardia no mgr no LE edema  Respiratory: normal effort BS clear bilaterally no wheeze  Abdomen: obese soft +BS no guarding or rebounding  Musculoskeletal: joints without swelling/erythema.    Data Reviewed: Basic Metabolic Panel: Recent Labs  Lab 03/07/19 0332 03/09/19 1213 03/10/19 0531  NA 139 135 134*  K 3.7 3.5 3.2*  CL 104 93* 96*  CO2 24 27 25   GLUCOSE 235* 181* 161*  BUN 16 9 6   CREATININE 0.87 0.98 0.76  CALCIUM 9.1 9.9 9.2   Liver Function Tests: Recent Labs  Lab 03/07/19 0332 03/09/19 1213  AST 33 29  ALT 24 22  ALKPHOS 106 79  BILITOT 0.7 1.5*  PROT 7.5 7.7  ALBUMIN 4.0 3.6   Recent Labs  Lab 03/07/19 0332 03/09/19 1213  LIPASE 4,812* 34   No results for input(s): AMMONIA in the last 168 hours. CBC: Recent Labs  Lab 03/07/19  7062 03/09/19 1213  WBC 18.5* 26.7*  NEUTROABS 14.0*  --   HGB 14.4 14.1  HCT 44.1 41.7  MCV 90.4 87.1  PLT 292 244   Cardiac Enzymes: No results for input(s): CKTOTAL, CKMB, CKMBINDEX, TROPONINI in the last 168 hours. BNP (last 3 results) No results for input(s): BNP in the last 8760 hours.  ProBNP (last 3 results) No results for input(s): PROBNP in the last  8760 hours.  CBG: No results for input(s): GLUCAP in the last 168 hours.  No results found for this or any previous visit (from the past 240 hour(s)).   Studies: Dg Abd Acute 2+v W 1v Chest  Result Date: 03/09/2019 CLINICAL DATA:  58 year old female with pancreatitis EXAM: DG ABDOMEN ACUTE W/ 1V CHEST COMPARISON:  CT 03/07/2019, plain film 07/28/2015 FINDINGS: Cardiomediastinal silhouette unchanged in size and contour. No evidence of central vascular congestion. No interlobular septal thickening. No pneumothorax or pleural effusion. Linear opacities at the left lung base likely atelectasis with no confluent airspace disease. Gas within stomach small bowel and colon. No abnormal distention. No radiopaque foreign body. IVC filter in place. No unexpected soft tissue density or calcification. IMPRESSION: Chest: No radiographic evidence of acute cardiopulmonary disease. Abdomen: Normal bowel gas pattern. Electronically Signed   By: [name] D.O.   On: 03/09/2019 15:17   US Abdomen Limited Ruq  Result Date: 03/09/2019 CLINICAL DATA:  Pancreatitis EXAM: ULTRASOUND ABDOMEN LIMITED RIGHT UPPER QUADRANT COMPARISON:  CT 03/07/2019 FINDINGS: Gallbladder: No gallstones or wall thickening visualized. No sonographic Murphy sign noted by sonographer. Common bile duct: Diameter: Normal caliber, 4 mm. Liver: Increased echotexture compatible with fatty infiltration. No focal abnormality or biliary ductal dilatation. Portal vein is patent on color Doppler imaging with normal direction of blood flow towards the liver. IMPRESSION: Fatty infiltration of the liver.  No acute findings. Electronically Signed   By: [name] M.D.   On: 03/09/2019 22:56    Scheduled Meds: . amLODipine  10 mg Oral Daily  . enoxaparin (LOVENOX) injection  40 mg Subcutaneous Q24H  . insulin aspart  0-9 Units Subcutaneous TID WC  . sodium chloride flush  3 mL Intravenous Q12H   Continuous Infusions: . lactated ringers 150 mL/hr at  03/10/19 1045    Principal Problem:   Pancreatitis Active Problems:   Class 2 severe obesity with serious comorbidity and body mass index (BMI) of 39.0 to 39.9 in adult (HCC)   Leukocytosis   HIV (human immunodeficiency virus infection) ([place])   Type 2 diabetes mellitus with hyperglycemia (HCC)   History of DVT (deep vein thrombosis)   Presence of IVC filter    Time spent: 45 minutes    [place] NP  [facility]  If 7PM-7AM, please contact night-coverage at [email or web address], password [facility] 03/10/2019, 1:03 PM  LOS: 0 days

## 2019-03-11 ENCOUNTER — Encounter (HOSPITAL_COMMUNITY): Payer: Self-pay | Admitting: Internal Medicine

## 2019-03-11 DIAGNOSIS — E876 Hypokalemia: Secondary | ICD-10-CM | POA: Diagnosis not present

## 2019-03-11 LAB — GLUCOSE, CAPILLARY
Glucose-Capillary: 153 mg/dL — ABNORMAL HIGH (ref 70–99)
Glucose-Capillary: 151 mg/dL — ABNORMAL HIGH (ref 70–99)
Glucose-Capillary: 141 mg/dL — ABNORMAL HIGH (ref 70–99)
Glucose-Capillary: 173 mg/dL — ABNORMAL HIGH (ref 70–99)

## 2019-03-11 LAB — COMPREHENSIVE METABOLIC PANEL
ALT: 41 U/L (ref 0–44)
AST: 48 U/L — ABNORMAL HIGH (ref 15–41)
Albumin: 2.8 g/dL — ABNORMAL LOW (ref 3.5–5.0)
Alkaline Phosphatase: 92 U/L (ref 38–126)
Anion gap: 14 (ref 5–15)
BUN: <5 mg/dL — ABNORMAL LOW (ref 6–20)
CO2: 27 mmol/L (ref 22–32)
Calcium: 9.1 mg/dL (ref 8.9–10.3)
Chloride: 95 mmol/L — ABNORMAL LOW (ref 98–111)
Creatinine, Ser: 0.72 mg/dL (ref 0.44–1.00)
GFR calc Af Amer: >60 mL/min (ref >60)
GFR calc non Af Amer: >60 mL/min (ref >60)
Glucose, Bld: 156 mg/dL — ABNORMAL HIGH (ref 70–99)
Potassium: 3 mmol/L — ABNORMAL LOW (ref 3.5–5.1)
Sodium: 136 mmol/L (ref 135–145)
Total Bilirubin: 1.6 mg/dL — ABNORMAL HIGH (ref 0.3–1.2)
Total Protein: 6.8 g/dL (ref 6.5–8.1)

## 2019-03-11 LAB — CBC
HCT: 36.6 % (ref 36.0–46.0)
Hemoglobin: 12.5 g/dL (ref 12.0–15.0)
MCH: 29.3 pg (ref 26.0–34.0)
MCHC: 34.2 g/dL (ref 30.0–36.0)
MCV: 85.9 fL (ref 80.0–100.0)
Platelets: 259 10*3/uL (ref 150–400)
RBC: 4.26 MIL/uL (ref 3.87–5.11)
RDW: 12.6 % (ref 11.5–15.5)
WBC: 18.4 10*3/uL — ABNORMAL HIGH (ref 4.0–10.5)
nRBC: 0 % (ref 0.0–0.2)

## 2019-03-11 LAB — TSH: TSH: 1.485 u[IU]/mL (ref 0.350–4.500)

## 2019-03-11 LAB — LIPASE, BLOOD: Lipase: 30 U/L (ref 11–51)

## 2019-03-11 MED ORDER — OXYCODONE HCL 5 MG PO TABS: 5.0000 mg | ORAL_TABLET | Freq: Four times a day (QID) | ORAL | Status: DC | PRN

## 2019-03-11 MED ORDER — POTASSIUM CHLORIDE CRYS ER 20 MEQ PO TBCR
40.0000 meq | EXTENDED_RELEASE_TABLET | ORAL | Status: AC
  Administered 2019-03-11 (×2): 40 meq via ORAL
  Filled 2019-03-11 (×2): qty 2

## 2019-03-11 MED ORDER — SPIRONOLACTONE 25 MG PO TABS
100.0000 mg | ORAL_TABLET | Freq: Every day | ORAL | Status: DC
  Administered 2019-03-11 – 2019-03-12 (×2): 100 mg via ORAL
  Filled 2019-03-11 (×2): qty 4

## 2019-03-11 NOTE — Progress Notes (Signed)
[facility] PROGRESS NOTE  Arnetha Silverthorne CWC:[id number] DOB: Apr 29, 1961 DOA: 03/09/2019 PCP: [name], MD  Assessment/Plan: 1.Pancreatitis: Acute.  CT showing acute pancreatitis and secondary inflammation in the duodenum, but without signs of necrosis on 4/20. Etiology likely medications. MRCP without pancreas divisum, choledocholithiasis or evidence of malignancy. Evaluated by gi who opined results offered no explanation for patients pancreatitis. Deines alcohol use. Hypertriglyceridemia and hypercalcemia ruled out as a possible cause as symptoms. GI recommending ID consult to evaluate need for medication adjustment or observe and see if becomes a recurring problem. Spoke to ID who recommends observing since patient improving and close follow up with [facility]. Tolerating clear liquids. Advance diet.  -Incentive spirometry -Lactated Ringer's 150 mL/h as tolerated -pain control -appreciate GI assistance -appreciate ID assistance  2.HIV: Patient on antiretroviral therapy of [place]. On 3/24 CD4 T cell antibodies 860(22 %), HIV RNA quantitative 36, HIV RNA quantitative log 1.56 copies/ml. Patient followed in the outpatient setting by Dr. Drucilla Schmidt. -RestartJulucawhen medically appropriate  3. Leukocytosis: WBC acutely worsening from 18.5->26.7>18.4. max temp 100.3. Non toxic appearing. Suspect related with pancreatitis. Chest xray 4/22 without cardiopulmonary process -urinalysis -incentive spirometry -mobilize -Continue to monitor  4.Essential hypertension: BP high end of normal.  Patient reportedly only taking amlodipine and lisinopril at home. No longer taking spironolactone. -continue amlodipine and restart spironolactone today -Holding lisinopril, will  restartwhen medically appropriate -monitor  5.Diabetes mellitus type 2:  Poor control. A1c 7.3 which is improved from 12/19. -CBGs withsensitiveSSI  5.History of DVT,s/p IVC filter: Placed  After having complications of bleeding on anticoagulation in 12/2014.  6.Constipation: had bm this am. Normal in color and consistency. Acute abdominal series showed no acute abnormalities. -Continue to monitor  7. Sinus tachycardia. suspect related with above. ekg with same. TSH within limits of normal.  -monitor  8. Obesity: BMI 39.66 kg/m  9. Hypokalemia. Likely related to vigorous IV fluids and output associated with such. -repleat -recheck  Code Status: full Family Communication: none present Disposition Plan: home hopefully tomorrow   Consultants:  buccini gi  Procedures:  MRCP  Antibiotics:    HPI/Subjective: HX well controlled HIV admitted with acute pancreatitis etiology presumed medications as ruled out divisum, choledocholithiasis. ID sceptical that mediation issue.  Gi and ID recommend observing as this is first episode and patient improving with close follow up with ID.  Reports feeling better. Tolerating clear liquids  Objective: Vitals:   03/11/19 0514 03/11/19 0928  BP: (!) 167/85 138/81  Pulse: (!) 101 (!) 103  Resp: 18 18  Temp: 99.6 F (37.6 C) 99.3 F (37.4 C)  SpO2: 95% 96%    Intake/Output Summary (Last 24 hours) at 03/11/2019 0930 Last data filed at 03/11/2019 0600 Gross per 24 hour  Intake 720 ml  Output 1900 ml  Net -1180 ml   Filed Weights   03/09/19 1135 03/09/19 2011 03/10/19 2036  Weight: 104.8 kg 104.6 kg 104.6 kg    Exam:   General:  Awake alert oriented x3 no acute distress  Cardiovascular: tachycardia but regular. No mgr no LE edema  Respiratory: normal effort BS clear bilaterally no wheeze  Abdomen: obese +BS no guarding or rebounding  Musculoskeletal: joints without swelling/erythema   Data Reviewed: Basic Metabolic Panel: Recent Labs  Lab 03/07/19 0332 03/09/19 1213 03/10/19 0531 03/11/19 0636  NA 139 135 134* 136  K 3.7 3.5 3.2* 3.0*  CL 104 93* 96* 95*  CO2 24 27 25 27   GLUCOSE 235* 181*  161* 156*  BUN 16 9 6  <5*  CREATININE 0.87 0.98 0.76 0.72  CALCIUM 9.1 9.9 9.2 9.1   Liver Function Tests: Recent Labs  Lab 03/07/19 0332 03/09/19 1213 03/11/19 0636  AST 33 29 48*  ALT 24 22 41  ALKPHOS 106 79 92  BILITOT 0.7 1.5* 1.6*  PROT 7.5 7.7 6.8  ALBUMIN 4.0 3.6 2.8*   Recent Labs  Lab 03/07/19 0332 03/09/19 1213 03/11/19 0636  LIPASE 4,812* 34 30   No results for input(s): AMMONIA in the last 168 hours. CBC: Recent Labs  Lab 03/07/19 0332 03/09/19 1213 03/11/19 0636  WBC 18.5* 26.7* 18.4*  NEUTROABS 14.0*  --   --   HGB 14.4 14.1 12.5  HCT 44.1 41.7 36.6  MCV 90.4 87.1 85.9  PLT 292 244 259   Cardiac Enzymes: No results for input(s): CKTOTAL, CKMB, CKMBINDEX, TROPONINI in the last 168 hours. BNP (last 3 results) No results for input(s): BNP in the last 8760 hours.  ProBNP (last 3 results) No results for input(s): PROBNP in the last 8760 hours.  CBG: Recent Labs  Lab 03/10/19 0656 03/10/19 1145 03/10/19 1629 03/10/19 2035 03/11/19 0659  GLUCAP 137* 147* 168* 133* 153*    No results found for this or any previous visit (from the past 240 hour(s)).   Studies: Mr 3d Recon At Scanner  Result Date: 03/10/2019 CLINICAL DATA:  Acute pancreatitis. First episode. Etiology unknown. History of HIV. EXAM: MRI ABDOMEN WITHOUT AND WITH CONTRAST (INCLUDING MRCP) TECHNIQUE: Multiplanar multisequence MR imaging of the abdomen was performed both before and after the administration of intravenous contrast. Heavily T2-weighted images of the biliary and pancreatic ducts were obtained, and three-dimensional MRCP images were rendered by post processing. CONTRAST:  10 cc Gadavist COMPARISON:  03/07/2019 CT.  03/09/2019 ultrasound. FINDINGS: Portions of exam are mildly motion degraded. Lower chest: Normal heart size with new trace bilateral pleural effusions. A low right axillary node is suboptimally imaged and evaluated, including at 2.3 cm on image 1/10.  Hepatobiliary: Moderate hepatic steatosis. Subcentimeter right hepatic lobe cyst. Normal gallbladder. No intra or extrahepatic biliary duct dilatation. The common duct is normal at 7 mm. No evidence of choledocholithiasis. Pancreas: Moderate pancreatic and peripancreatic edema, most apparent about the head and uncinate process. Given cross modality comparison, felt to be similar to slightly decreased compared to 03/07/2019. No evidence of pancreatic necrosis. No pancreas divisum. No well-contained peripancreatic fluid collection. No duct dilatation. Spleen:  Normal in size, without focal abnormality. Adrenals/Urinary Tract: Minimal bilateral adrenal nodularity. Left renal cysts on the order of 1.0 cm. Normal right kidney. No hydronephrosis. Stomach/Bowel: Normal stomach and abdominal bowel loops. Vascular/Lymphatic: Normal caliber of the aorta and branch vessels. IVC filter. Patent portal and splenic veins. No abdominal adenopathy. Other:  No abdominal ascites. Musculoskeletal: No acute osseous abnormality. IMPRESSION: 1. Mild motion degradation. 2. Non complicated pancreatitis, similar to slightly decreased compared to 03/07/2019. No choledocholithiasis, pancreas divisum, or acute complication. 3. Hepatic steatosis. 4. New trace bilateral pleural effusions. 5. Prominent low right axillary node, possibly related to the clinical history of HIV. Consider correlation with outpatient ultrasound. Electronically Signed   By: Abigail Miyamoto M.D.   On: 03/10/2019 14:30   Dg Abd Acute 2+v W 1v Chest  Result Date: 03/09/2019 CLINICAL DATA:  58 year old female with pancreatitis EXAM: DG ABDOMEN ACUTE W/ 1V CHEST COMPARISON:  CT 03/07/2019, plain film 07/28/2015 FINDINGS: Cardiomediastinal silhouette unchanged in size and contour. No evidence of central vascular congestion. No interlobular septal thickening. No pneumothorax  or pleural effusion. Linear opacities at the left lung base likely atelectasis with no confluent  airspace disease. Gas within stomach small bowel and colon. No abnormal distention. No radiopaque foreign body. IVC filter in place. No unexpected soft tissue density or calcification. IMPRESSION: Chest: No radiographic evidence of acute cardiopulmonary disease. Abdomen: Normal bowel gas pattern. Electronically Signed   By: [name] D.O.   On: 03/09/2019 15:17   Mr Abdomen Mrcp Moise Boring Contast  Result Date: 03/10/2019 CLINICAL DATA:  Acute pancreatitis. First episode. Etiology unknown. History of HIV. EXAM: MRI ABDOMEN WITHOUT AND WITH CONTRAST (INCLUDING MRCP) TECHNIQUE: Multiplanar multisequence MR imaging of the abdomen was performed both before and after the administration of intravenous contrast. Heavily T2-weighted images of the biliary and pancreatic ducts were obtained, and three-dimensional MRCP images were rendered by post processing. CONTRAST:  10 cc Gadavist COMPARISON:  03/07/2019 CT.  03/09/2019 ultrasound. FINDINGS: Portions of exam are mildly motion degraded. Lower chest: Normal heart size with new trace bilateral pleural effusions. A low right axillary node is suboptimally imaged and evaluated, including at 2.3 cm on image 1/10. Hepatobiliary: Moderate hepatic steatosis. Subcentimeter right hepatic lobe cyst. Normal gallbladder. No intra or extrahepatic biliary duct dilatation. The common duct is normal at 7 mm. No evidence of choledocholithiasis. Pancreas: Moderate pancreatic and peripancreatic edema, most apparent about the head and uncinate process. Given cross modality comparison, felt to be similar to slightly decreased compared to 03/07/2019. No evidence of pancreatic necrosis. No pancreas divisum. No well-contained peripancreatic fluid collection. No duct dilatation. Spleen:  Normal in size, without focal abnormality. Adrenals/Urinary Tract: Minimal bilateral adrenal nodularity. Left renal cysts on the order of 1.0 cm. Normal right kidney. No hydronephrosis. Stomach/Bowel: Normal  stomach and abdominal bowel loops. Vascular/Lymphatic: Normal caliber of the aorta and branch vessels. IVC filter. Patent portal and splenic veins. No abdominal adenopathy. Other:  No abdominal ascites. Musculoskeletal: No acute osseous abnormality. IMPRESSION: 1. Mild motion degradation. 2. Non complicated pancreatitis, similar to slightly decreased compared to 03/07/2019. No choledocholithiasis, pancreas divisum, or acute complication. 3. Hepatic steatosis. 4. New trace bilateral pleural effusions. 5. Prominent low right axillary node, possibly related to the clinical history of HIV. Consider correlation with outpatient ultrasound. Electronically Signed   By: Abigail Miyamoto M.D.   On: 03/10/2019 14:30   US Abdomen Limited Ruq  Result Date: 03/09/2019 CLINICAL DATA:  Pancreatitis EXAM: ULTRASOUND ABDOMEN LIMITED RIGHT UPPER QUADRANT COMPARISON:  CT 03/07/2019 FINDINGS: Gallbladder: No gallstones or wall thickening visualized. No sonographic Murphy sign noted by sonographer. Common bile duct: Diameter: Normal caliber, 4 mm. Liver: Increased echotexture compatible with fatty infiltration. No focal abnormality or biliary ductal dilatation. Portal vein is patent on color Doppler imaging with normal direction of blood flow towards the liver. IMPRESSION: Fatty infiltration of the liver.  No acute findings. Electronically Signed   By: [name] M.D.   On: 03/09/2019 22:56    Scheduled Meds: . amLODipine  10 mg Oral Daily  . enoxaparin (LOVENOX) injection  40 mg Subcutaneous Q24H  . insulin aspart  0-9 Units Subcutaneous TID WC  . sodium chloride flush  3 mL Intravenous Q12H  . spironolactone  100 mg Oral Daily   Continuous Infusions: . lactated ringers 150 mL/hr at 03/11/19 0248    Principal Problem:   Pancreatitis Active Problems:   Class 2 severe obesity with serious comorbidity and body mass index (BMI) of 39.0 to 39.9 in adult (HCC)   Leukocytosis   HIV (human immunodeficiency virus  infection)  ([place])   Hypertension   Type 2 diabetes mellitus with hyperglycemia (HCC)   History of DVT (deep vein thrombosis)   Presence of IVC filter   Hypokalemia    Time spent: 62 minutes    [place] NP  [facility]  If 7PM-7AM, please contact night-coverage at [email or web address], password [facility] 03/11/2019, 9:30 AM  LOS: 1 day

## 2019-03-11 NOTE — Progress Notes (Signed)
Patient's blood pressure this morning is 167/85 and heart rate 101. Patient verbalized concerned and [place], NP text paged. Awaiting for orders.

## 2019-03-12 LAB — CBC
HCT: 38.4 % (ref 36.0–46.0)
Hemoglobin: 13 g/dL (ref 12.0–15.0)
MCH: 29.3 pg (ref 26.0–34.0)
MCHC: 33.9 g/dL (ref 30.0–36.0)
MCV: 86.5 fL (ref 80.0–100.0)
Platelets: 265 10*3/uL (ref 150–400)
RBC: 4.44 MIL/uL (ref 3.87–5.11)
RDW: 12.6 % (ref 11.5–15.5)
WBC: 16.4 10*3/uL — ABNORMAL HIGH (ref 4.0–10.5)
nRBC: 0 % (ref 0.0–0.2)

## 2019-03-12 LAB — BASIC METABOLIC PANEL
Anion gap: 16 — ABNORMAL HIGH (ref 5–15)
BUN: 5 mg/dL — ABNORMAL LOW (ref 6–20)
CO2: 23 mmol/L (ref 22–32)
Calcium: 9.4 mg/dL (ref 8.9–10.3)
Chloride: 97 mmol/L — ABNORMAL LOW (ref 98–111)
Creatinine, Ser: 0.87 mg/dL (ref 0.44–1.00)
GFR calc Af Amer: >60 mL/min (ref >60)
GFR calc non Af Amer: >60 mL/min (ref >60)
Glucose, Bld: 148 mg/dL — ABNORMAL HIGH (ref 70–99)
Potassium: 3.6 mmol/L (ref 3.5–5.1)
Sodium: 136 mmol/L (ref 135–145)

## 2019-03-12 LAB — GLUCOSE, CAPILLARY
Glucose-Capillary: 54 mg/dL — ABNORMAL LOW (ref 70–99)
Glucose-Capillary: 152 mg/dL — ABNORMAL HIGH (ref 70–99)
Glucose-Capillary: 154 mg/dL — ABNORMAL HIGH (ref 70–99)
Glucose-Capillary: 124 mg/dL — ABNORMAL HIGH (ref 70–99)

## 2019-03-12 MED ORDER — RILPIVIRINE HCL 25 MG PO TABS
25.0000 mg | ORAL_TABLET | Freq: Every day | ORAL | Status: DC
  Administered 2019-03-12: 25 mg via ORAL
  Filled 2019-03-12: qty 1

## 2019-03-12 MED ORDER — DOLUTEGRAVIR-RILPIVIRINE 50-25 MG PO TABS: 1.0000 | ORAL_TABLET | Freq: Every day | ORAL | Status: DC

## 2019-03-12 MED ORDER — DOLUTEGRAVIR SODIUM 50 MG PO TABS: 50.0000 mg | ORAL_TABLET | Freq: Every day | ORAL | Status: DC

## 2019-03-12 MED ORDER — RILPIVIRINE HCL 25 MG PO TABS
25.0000 mg | ORAL_TABLET | Freq: Every day | ORAL | Status: DC
  Filled 2019-03-12: qty 1

## 2019-03-12 MED ORDER — DOLUTEGRAVIR SODIUM 50 MG PO TABS
50.0000 mg | ORAL_TABLET | Freq: Every day | ORAL | Status: DC
  Administered 2019-03-12: 50 mg via ORAL
  Filled 2019-03-12: qty 1

## 2019-03-12 MED ORDER — TRAMADOL HCL 50 MG PO TABS: 50.0000 mg | ORAL_TABLET | Freq: Four times a day (QID) | ORAL | 0 refills | Status: AC | PRN

## 2019-03-12 NOTE — Discharge Summary (Signed)
[facility] Discharge Summary   Patient: Wanda Kane LFY:[id number]   PCP: [name], MD DOB: 1961/01/31   Date of admission: 03/09/2019   Date of discharge:  03/12/2019    Discharge Diagnoses:   Principal Problem:   Pancreatitis Active Problems:   HIV (human immunodeficiency virus infection) ([place])   Hypertension   Class 2 severe obesity with serious comorbidity and body mass index (BMI) of 39.0 to 39.9 in adult [facility])   Type 2 diabetes mellitus with hyperglycemia (HCC)   History of DVT (deep vein thrombosis)   Presence of IVC filter   Leukocytosis   Hypokalemia   Admitted From: home Disposition:  home  Recommendations for Outpatient Follow-up:  1. Please follow up with PCP in 1 week   Follow-up Information    [name], MD. Schedule an appointment as soon as possible for a visit in 1 week(s).   Specialty:  Internal Medicine Contact information: [place] [address] [phone]        Carlyle Basques, MD .   Specialty:  Infectious Diseases Contact information: [address]  [place] [address] [phone]          Diet recommendation: low fat diet  Activity: The patient is advised to gradually reintroduce usual activities.  Discharge Condition: good  Code Status: full code  History of present illness: As per the H and P dictated on admission, "Wanda Kane is a 58 y.o. female with medical history significant of HTN, HLD, DM type II, HIV, and obesity; who presents with complaints of epigastric abdominal pain.  Patient had just been seen in the emergency department on 4/20.  Noted to have associated symptoms of nausea and vomiting with lipase of 4812.  CT imaging obtained at that time revealed acute pancreatitis without signs of necrosis.  Patient denied any use of alcohol or illicit drugs.  Triglycerides noted to be within normal limits at 119.  Medications reviewed and symptoms thought possibly  related with her antiretroviral medication of Juluca.  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  She tried management of her symptoms at home.  Reports being able to tolerate liquids without any problem, but pain was uncontrolled despite taking hydrocodone and Motrin.  Motrin seemed to help symptoms better than oxycodone.  Denies having any further vomiting, but has not had a bowel movement in the last 4 days.  Denies any complaints of fever.  No family history of pancreatitis.  The only new medication that was started last 2 months was Lamisil for toe fungus. Diagnosed with diabetes in January of this year, but wanted to try diet changes and exercise prior to being started on medication.  Patient reports that she was doing good up until the state home orders."  Hospital Course:  Summary of her active problems in the hospital is as following. 1.Pancreatitis: Acute. CT showing acute pancreatitis and secondary inflammation in the duodenum, but without signs of necrosis on 4/20.  Etiology likely medications herbal supplement bitter melon. MRCP without pancreas divisum, choledocholithiasis or evidence of malignancy.  Evaluated by gi who opined results offered no explanation for patients pancreatitis. Deines alcohol use. Hypertriglyceridemia and hypercalcemia ruled out as a possible cause as symptoms. GI recommending ID consult to evaluate need for medication adjustment or observe and see if becomes a recurring problem.  Spoke to ID who recommends resuming the medication since patient improving and close  follow up with [place].  -pain control -appreciate GI assistance -appreciate ID assistance  2.HIV: Patient on antiretroviral therapy of Juluca. On 3/24 CD4 T cell antibodies 860(22 %), HIV RNA quantitative 36, HIV RNA quantitative log 1.56 copies/ml. Patient followed in the outpatient setting by Dr.  Drucilla Schmidt. -RestartJuluca  3. Leukocytosis: WBC acutely worsening from 18.5->26.7>18.4. max temp 100.3.Non toxic appearing.Suspect related with pancreatitis. Chest xray 4/22 without cardiopulmonary process  4.Essential hypertension: BP high end of normal.  Patient reportedly only taking amlodipine and lisinopril at home. No longer taking spironolactone. -continue amlodipine and restart spironolactone today -Holding lisinopril, will  restartwhen medically appropriate  5.Diabetes mellitus type 2:Poor control. A1c 7.3 which is improved from 12/19.  5.History of DVT,s/p IVC filter: Placed After having complications of bleeding on anticoagulation in 12/2014.  6.Constipation:had bm this am. Normal in color and consistency.Acute abdominal series showed no acute abnormalities. -Continue to monitor  7. Sinus tachycardia.suspect related with above. ekg with same. TSH within limits of normal.  -monitor  8. Obesity: BMI 39.66 kg/m  9. Hypokalemia. Likely related to vigorous IV fluids and output associated with such.  10. Pain control  - [place] Controlled Substance Reporting System database was reviewed. - Last prescription for controlled substance was on 4/20 for norco. - 5 day supply was provided. - Patient was instructed, not to drive, operate heavy machinery, perform activities at heights, swimming or participation in water activities or provide baby sitting services while on Pain, Sleep and Anxiety Medications; until her outpatient Physician has advised to do so again.  - Also recommended to not to take more than prescribed Pain, Sleep and Anxiety Medications.  Patient was ambulatory without any assistance. On the day of the discharge the patient's vitals were stable, and no other acute medical condition were reported by patient. the patient was felt safe to be discharge at home with family.  Consultants: gastroenterology  Procedures:  none  DISCHARGE MEDICATION: Allergies as of 03/12/2019      Reactions   Pepcid [famotidine] Other (See Comments)   Prilosec [omeprazole] Other (See Comments)   Viread [tenofovir Disoproxil] Other (See Comments)   Rhabdomyolysis      Medication List    STOP taking these medications   HYDROcodone-acetaminophen 5-325 MG tablet Commonly known as:  NORCO/VICODIN   promethazine 25 MG tablet Commonly known as:  PHENERGAN   terbinafine 250 MG tablet Commonly known as:  LAMISIL     TAKE these medications   acetaminophen 500 MG tablet Commonly known as:  TYLENOL Take 1,000 mg by mouth every 6 (six) hours as needed for mild pain, moderate pain or headache.   amLODipine 10 MG tablet Commonly known as:  NORVASC TAKE 1 TABLET (10 MG TOTAL) BY MOUTH DAILY.   B-Complex-C Tabs Take 1 tablet by mouth daily.   ciclopirox 8 % solution Commonly known as:  Penlac Apply topically at bedtime. Apply over nail QHS.  Apply  Daily over previous coat. After (7) days, may remove with alcohol and continue cycle.   glucose blood test strip Commonly known as:  True Metrix Blood Glucose Test Check sugar once a day   Juluca 50-25 MG Tabs Generic drug:  Dolutegravir-Rilpivirine TAKE 1 TABLET BY MOUTH DAILY.   lisinopril 10 MG tablet Commonly known as:  ZESTRIL TAKE 1 TABLET (10 MG TOTAL) BY MOUTH DAILY.   multivitamin capsule Take 1 capsule by mouth daily.   spironolactone 100 MG tablet Commonly known as:  ALDACTONE TAKE 1 TABLET (  100 MG TOTAL) BY MOUTH DAILY.   traMADol 50 MG tablet Commonly known as:  Ultram Take 1 tablet (50 mg total) by mouth every 6 (six) hours as needed for up to 5 days.   True Metrix Meter w/Device Kit Check sugar once a day   TRUEplus Lancets 28G Misc Check sugar once a day      Allergies  Allergen Reactions   Pepcid [Famotidine] Other (See Comments)   Prilosec [Omeprazole] Other (See Comments)   Viread [Tenofovir Disoproxil] Other (See Comments)     Rhabdomyolysis   Discharge Instructions    Diet fat modified   Complete by:  As directed    Discharge instructions   Complete by:  As directed    It is important that you read the given instructions as well as go over your medication list with RN to help you understand your care after this hospitalization.  Discharge Instructions: Please follow-up with PCP in 1-2 weeks  Please request your primary care physician to go over all Hospital Tests and Procedure/Radiological results at the follow up. Please get all Hospital records sent to your PCP by signing hospital release before you go home.   Do not drive, operating heavy machinery, perform activities at heights, swimming or participation in water activities or provide baby sitting services while you are on Pain, Sleep and Anxiety Medications; until you have been seen by Primary Care Physician or a Neurologist and advised to do so again. Do not take more than prescribed Pain, Sleep and Anxiety Medications. You were cared for by a hospitalist during your hospital stay. If you have any questions about your discharge medications or the care you received while you were in the hospital after you are discharged, you can call the unit @UNIT @ you were admitted to and ask to speak with the hospitalist on call if the hospitalist that took care of you is not available.  Once you are discharged, your primary care physician will handle any further medical issues. Please note that NO REFILLS for any discharge medications will be authorized once you are discharged, as it is imperative that you return to your primary care physician (or establish a relationship with a primary care physician if you do not have one) for your aftercare needs so that they can reassess your need for medications and monitor your lab values. You Must read complete instructions/literature along with all the possible adverse reactions/side effects for all the Medicines you take and that  have been prescribed to you. Take any new Medicines after you have completely understood and accept all the possible adverse reactions/side effects. Wear Seat belts while driving. If you have smoked or chewed Tobacco in the last 2 yrs please stop smoking and/or stop any Recreational drug use.  If you drink alcohol, please STOP the use and do not drive, operating heavy machinery, perform activities at heights, swimming or participation in water activities or provide baby sitting services under influence.   Increase activity slowly   Complete by:  As directed      Discharge Exam: Filed Weights   03/09/19 2011 03/10/19 2036 03/12/19 0307  Weight: 104.6 kg 104.6 kg 102.8 kg   Vitals:   03/11/19 2023 03/12/19 0927  BP: (!) 142/76 (!) 141/71  Pulse: (!) 108 (!) 103  Resp: 18 19  Temp: 100.3 F (37.9 C) 98.8 F (37.1 C)  SpO2: 97% 98%   General: Appear in no distress, no Rash; Oral Mucosa moist. Cardiovascular: S1 and S2 Present,  no Murmur, no JVD Respiratory: Bilateral Air entry present and Clear to Auscultation, no Crackles, no wheezes Abdomen: Bowel Sound present, Soft and no tenderness Extremities: no Pedal edema, no calf tenderness Neurology: Grossly no focal neuro deficit.  The results of significant diagnostics from this hospitalization (including imaging, microbiology, ancillary and laboratory) are listed below for reference.    Significant Diagnostic Studies: Ct Abdomen Pelvis W Contrast  Result Date: 03/07/2019 CLINICAL DATA:  58 year old female with abdominal pain nausea and vomiting. EXAM: CT ABDOMEN AND PELVIS WITH CONTRAST TECHNIQUE: Multidetector CT imaging of the abdomen and pelvis was performed using the standard protocol following bolus administration of intravenous contrast. CONTRAST:  155m OMNIPAQUE IOHEXOL 300 MG/ML  SOLN COMPARISON:  Pelvis ultrasound 04/25/2016. FINDINGS: Lower chest: Negative, minor lung base atelectasis. No pericardial or pleural effusion.  Hepatobiliary: Small benign appearing low-density area in the right hepatic lobe measuring 7 millimeters on series 2, image 17. Negative gallbladder. No bile duct enlargement. Pancreas: Confluent peripancreatic inflammation. The pancreas appears mildly enlarged. No pancreatic necrosis or ductal dilatation identified. Inflammation tracks in the midline retroperitoneum inferiorly and in the root of the small bowel mesentery. There is secondary inflammation of the duodenum. No organized or drainable fluid collection. Spleen: Trace perisplenic free fluid. Adrenals/Urinary Tract: Negative adrenal glands. Bilateral renal enhancement and contrast excretion symmetric and within normal limits. Occasional small benign appearing renal cysts. Negative ureters and urinary bladder. Stomach/Bowel: Decompressed and negative large bowel from the splenic flexure distally. Pancreatic related inflammation adjacent to some of the more proximal large bowel and also the appendix, but the appendix is not inflamed (coronal image 89. Negative terminal ileum. No dilated small bowel. Pancreas related inflammation at the root of the small bowel mesentery. Largely decompressed stomach. There does appear to be secondary inflammation of the duodenum adjacent to the pancreas. No free air. Small volume free fluid. Vascular/Lymphatic: Suboptimal intravascular contrast bolus but the major arterial structures and portal venous system appear patent. There is an IVC filter in place. No lymphadenopathy. Reproductive: Fibroid uterus. Lobulated left fundal and uterine body intramural fibroids, partially calcified. Negative ovaries. Other: No pelvic free fluid. Musculoskeletal: Advanced lumbar disc and endplate degeneration. Advanced lower lumbar facet degeneration with vacuum facet. Small benign appearing bone lesion of the left femur greater trochanter, perhaps in chondroma (coronal image 114) no acute osseous abnormality identified. IMPRESSION: 1. Acute  Pancreatitis. Peripancreatic inflammation with secondary inflammation of the duodenum. Small volume free fluid with no organized or drainable fluid collection. No pancreatic necrosis or other complicating features. 2. IVC filter in place.  Fibroid uterus. Electronically Signed   By: HGenevie AnnM.D.   On: 03/07/2019 05:45   Mr 3d Recon At Scanner  Result Date: 03/10/2019 CLINICAL DATA:  Acute pancreatitis. First episode. Etiology unknown. History of HIV. EXAM: MRI ABDOMEN WITHOUT AND WITH CONTRAST (INCLUDING MRCP) TECHNIQUE: Multiplanar multisequence MR imaging of the abdomen was performed both before and after the administration of intravenous contrast. Heavily T2-weighted images of the biliary and pancreatic ducts were obtained, and three-dimensional MRCP images were rendered by post processing. CONTRAST:  10 cc Gadavist COMPARISON:  03/07/2019 CT.  03/09/2019 ultrasound. FINDINGS: Portions of exam are mildly motion degraded. Lower chest: Normal heart size with new trace bilateral pleural effusions. A low right axillary node is suboptimally imaged and evaluated, including at 2.3 cm on image 1/10. Hepatobiliary: Moderate hepatic steatosis. Subcentimeter right hepatic lobe cyst. Normal gallbladder. No intra or extrahepatic biliary duct dilatation. The common duct is normal at 7 mm.  No evidence of choledocholithiasis. Pancreas: Moderate pancreatic and peripancreatic edema, most apparent about the head and uncinate process. Given cross modality comparison, felt to be similar to slightly decreased compared to 03/07/2019. No evidence of pancreatic necrosis. No pancreas divisum. No well-contained peripancreatic fluid collection. No duct dilatation. Spleen:  Normal in size, without focal abnormality. Adrenals/Urinary Tract: Minimal bilateral adrenal nodularity. Left renal cysts on the order of 1.0 cm. Normal right kidney. No hydronephrosis. Stomach/Bowel: Normal stomach and abdominal bowel loops. Vascular/Lymphatic:  Normal caliber of the aorta and branch vessels. IVC filter. Patent portal and splenic veins. No abdominal adenopathy. Other:  No abdominal ascites. Musculoskeletal: No acute osseous abnormality. IMPRESSION: 1. Mild motion degradation. 2. Non complicated pancreatitis, similar to slightly decreased compared to 03/07/2019. No choledocholithiasis, pancreas divisum, or acute complication. 3. Hepatic steatosis. 4. New trace bilateral pleural effusions. 5. Prominent low right axillary node, possibly related to the clinical history of HIV. Consider correlation with outpatient ultrasound. Electronically Signed   By: Abigail Miyamoto M.D.   On: 03/10/2019 14:30   Dg Abd Acute 2+v W 1v Chest  Result Date: 03/09/2019 CLINICAL DATA:  58 year old female with pancreatitis EXAM: DG ABDOMEN ACUTE W/ 1V CHEST COMPARISON:  CT 03/07/2019, plain film 07/28/2015 FINDINGS: Cardiomediastinal silhouette unchanged in size and contour. No evidence of central vascular congestion. No interlobular septal thickening. No pneumothorax or pleural effusion. Linear opacities at the left lung base likely atelectasis with no confluent airspace disease. Gas within stomach small bowel and colon. No abnormal distention. No radiopaque foreign body. IVC filter in place. No unexpected soft tissue density or calcification. IMPRESSION: Chest: No radiographic evidence of acute cardiopulmonary disease. Abdomen: Normal bowel gas pattern. Electronically Signed   By: [name] D.O.   On: 03/09/2019 15:17   Mr Abdomen Mrcp Moise Boring Contast  Result Date: 03/10/2019 CLINICAL DATA:  Acute pancreatitis. First episode. Etiology unknown. History of HIV. EXAM: MRI ABDOMEN WITHOUT AND WITH CONTRAST (INCLUDING MRCP) TECHNIQUE: Multiplanar multisequence MR imaging of the abdomen was performed both before and after the administration of intravenous contrast. Heavily T2-weighted images of the biliary and pancreatic ducts were obtained, and three-dimensional MRCP images were  rendered by post processing. CONTRAST:  10 cc Gadavist COMPARISON:  03/07/2019 CT.  03/09/2019 ultrasound. FINDINGS: Portions of exam are mildly motion degraded. Lower chest: Normal heart size with new trace bilateral pleural effusions. A low right axillary node is suboptimally imaged and evaluated, including at 2.3 cm on image 1/10. Hepatobiliary: Moderate hepatic steatosis. Subcentimeter right hepatic lobe cyst. Normal gallbladder. No intra or extrahepatic biliary duct dilatation. The common duct is normal at 7 mm. No evidence of choledocholithiasis. Pancreas: Moderate pancreatic and peripancreatic edema, most apparent about the head and uncinate process. Given cross modality comparison, felt to be similar to slightly decreased compared to 03/07/2019. No evidence of pancreatic necrosis. No pancreas divisum. No well-contained peripancreatic fluid collection. No duct dilatation. Spleen:  Normal in size, without focal abnormality. Adrenals/Urinary Tract: Minimal bilateral adrenal nodularity. Left renal cysts on the order of 1.0 cm. Normal right kidney. No hydronephrosis. Stomach/Bowel: Normal stomach and abdominal bowel loops. Vascular/Lymphatic: Normal caliber of the aorta and branch vessels. IVC filter. Patent portal and splenic veins. No abdominal adenopathy. Other:  No abdominal ascites. Musculoskeletal: No acute osseous abnormality. IMPRESSION: 1. Mild motion degradation. 2. Non complicated pancreatitis, similar to slightly decreased compared to 03/07/2019. No choledocholithiasis, pancreas divisum, or acute complication. 3. Hepatic steatosis. 4. New trace bilateral pleural effusions. 5. Prominent low right axillary node, possibly  related to the clinical history of HIV. Consider correlation with outpatient ultrasound. Electronically Signed   By: Abigail Miyamoto M.D.   On: 03/10/2019 14:30   US Abdomen Limited Ruq  Result Date: 03/09/2019 CLINICAL DATA:  Pancreatitis EXAM: ULTRASOUND ABDOMEN LIMITED RIGHT UPPER  QUADRANT COMPARISON:  CT 03/07/2019 FINDINGS: Gallbladder: No gallstones or wall thickening visualized. No sonographic Murphy sign noted by sonographer. Common bile duct: Diameter: Normal caliber, 4 mm. Liver: Increased echotexture compatible with fatty infiltration. No focal abnormality or biliary ductal dilatation. Portal vein is patent on color Doppler imaging with normal direction of blood flow towards the liver. IMPRESSION: Fatty infiltration of the liver.  No acute findings. Electronically Signed   By: [name] M.D.   On: 03/09/2019 22:56    Microbiology: No results found for this or any previous visit (from the past 240 hour(s)).   Labs: CBC: Recent Labs  Lab 03/07/19 0332 03/09/19 1213 03/11/19 0636 03/12/19 0428  WBC 18.5* 26.7* 18.4* 16.4*  NEUTROABS 14.0*  --   --   --   HGB 14.4 14.1 12.5 13.0  HCT 44.1 41.7 36.6 38.4  MCV 90.4 87.1 85.9 86.5  PLT 292 244 259 382   Basic Metabolic Panel: Recent Labs  Lab 03/07/19 0332 03/09/19 1213 03/10/19 0531 03/11/19 0636 03/12/19 0428  NA 139 135 134* 136 136  K 3.7 3.5 3.2* 3.0* 3.6  CL 104 93* 96* 95* 97*  CO2 24 27 25 27 23   GLUCOSE 235* 181* 161* 156* 148*  BUN 16 9 6  <5* 5*  CREATININE 0.87 0.98 0.76 0.72 0.87  CALCIUM 9.1 9.9 9.2 9.1 9.4   Liver Function Tests: Recent Labs  Lab 03/07/19 0332 03/09/19 1213 03/11/19 0636  AST 33 29 48*  ALT 24 22 41  ALKPHOS 106 79 92  BILITOT 0.7 1.5* 1.6*  PROT 7.5 7.7 6.8  ALBUMIN 4.0 3.6 2.8*   Recent Labs  Lab 03/07/19 0332 03/09/19 1213 03/11/19 0636  LIPASE 4,812* 34 30   No results for input(s): AMMONIA in the last 168 hours. Cardiac Enzymes: No results for input(s): CKTOTAL, CKMB, CKMBINDEX, TROPONINI in the last 168 hours. BNP (last 3 results) No results for input(s): BNP in the last 8760 hours. CBG: Recent Labs  Lab 03/11/19 2128 03/12/19 0704 03/12/19 0739 03/12/19 1118 03/12/19 1638  GLUCAP 173* 54* 152* 154* 124*   Time spent: 35  minutes  Signed:  Berle Mull  [facility]  03/12/2019

## 2019-03-12 NOTE — Discharge Instructions (Signed)
Pancreatitis Eating Plan  Pancreatitis is when your pancreas becomes irritated and swollen (inflamed). The pancreas is a small organ located behind your stomach. It helps your body digest food and regulate your blood sugar. Pancreatitis can affect how your body digests food, especially foods with fat. You may also have other symptoms such as abdominal pain or nausea.  When you have pancreatitis, following a low-fat eating plan may help you manage symptoms and recover more quickly. Work with your health care provider or a diet and nutrition specialist (dietitian) to create an eating plan that is right for you.  What are tips for following this plan?  Reading food labels  Use the information on food labels to help keep track of how much fat you eat:   Check the serving size.   Look for the amount of total fat in grams (g) in one serving.  ? Low-fat foods have 3 g of fat or less per serving.  ? Fat-free foods have 0.5 g of fat or less per serving.   Keep track of how much fat you eat based on how many servings you eat.  ? For example, if you eat two servings, the amount of fat you eat will be two times what is listed on the label.  Shopping     Buy low-fat or nonfat foods, such as:  ? Fresh, frozen, or canned fruits and vegetables.  ? Grains, including pasta, bread, and rice.  ? Lean meat, poultry, fish, and other protein foods.  ? Low-fat or nonfat dairy.   Avoid buying bakery products and other sweets made with whole milk, butter, and eggs.   Avoid buying snack foods with added fat, such as anything with butter or cheese flavoring.  Cooking   Remove skin from poultry, and remove extra fat from meat.   Limit the amount of fat and oil you use to 6 teaspoons or less per day.   Cook using low-fat methods, such as boiling, broiling, grilling, steaming, or baking.   Use spray oil to cook. Add fat-free chicken broth to add flavor and moisture.   Avoid adding cream to thicken soups or sauces. Use other thickeners  such as corn starch or tomato paste.  Meal planning     Eat a low-fat diet as told by your dietitian. For most people, this means having no more than 55-65 grams of fat each day.   Eat small, frequent meals throughout the day. For example, you may have 5-6 small meals instead of 3 large meals.   Drink enough fluid to keep your urine pale yellow.   Do not drink alcohol. Talk to your health care provider if you need help stopping.   Limit how much caffeine you have, including black coffee, black and green tea, caffeinated soft drinks, and energy drinks.  General information   Let your health care provider or dietitian know if you have unplanned weight loss on this eating plan.   You may be instructed to follow a clear liquid diet during a flare of symptoms. Talk with your health care provider about how to manage your diet during symptoms of a flare.   Take any vitamins or supplements as told by your health care provider.   Work with a dietitian, especially if you have other conditions such as obesity or diabetes mellitus.  What foods should I avoid?  Fruits  Fried fruits. Fruits served with butter or cream.  Vegetables  Fried vegetables. Vegetables cooked with butter, cheese, or   cream.  Grains  Biscuits, waffles, donuts, pastries, and croissants. Pies and cookies. Butter-flavored popcorn. Regular crackers.  Meats and other protein foods  Fatty cuts of meat. Poultry with skin. Organ meats. Bacon, sausage, and cold cuts. Whole eggs. Nuts and nut butters.  Dairy  Whole and 2% milk. Whole milk yogurt. Whole milk ice cream. Cream and half-and-half. Cream cheese. Sour cream. Cheese.  Beverages  Wine, beer, and liquor.  The items listed above may not be a complete list of foods and beverages to avoid. Contact a dietitian for more information.  Summary   Pancreatitis can affect how your body digests food, especially foods with fat.   When you have pancreatitis, it is recommended that you follow a low-fat eating  plan to help you recover more quickly and manage symptoms. For most people, this means limiting fat to no more than 55-65 grams per day.   Do not drink alcohol. Limit the amount of caffeine you have, and drink enough fluid to keep your urine pale yellow.  This information is not intended to replace advice given to you by your health care provider. Make sure you discuss any questions you have with your health care provider.  Document Released: 02/09/2018 Document Revised: 02/09/2018 Document Reviewed: 02/09/2018  Elsevier Interactive Patient Education  2019 Elsevier Inc.

## 2019-03-12 NOTE — Progress Notes (Signed)
DISCHARGE NOTE HOME Quincey XXXThomas to be discharged to home per MD order. Discussed prescriptions and follow up appointments with the patient.Reminded patient to pick -up her prescribed pain medicine from her pharmacy; medication list explained in detail. Patient verbalized understanding.  Skin clean, dry and intact without evidence of skin break down, no evidence of skin tears noted. IV catheter discontinued intact. Site without signs and symptoms of complications. Dressing and pressure applied. Pt denies pain at the site currently. No complaints of abdominal discomfort post meals.  Patient free of lines, drains, and wounds.   An After Visit Summary (AVS) was printed and given to the patient,included are some health education print out and these were explained to patient. Patient escorted via wheelchair, and discharged home via private auto.  Bushong, Zenon Mayo, RN

## 2019-03-14 ENCOUNTER — Encounter: Payer: Self-pay | Admitting: *Deleted

## 2019-03-14 ENCOUNTER — Other Ambulatory Visit: Payer: Self-pay | Admitting: *Deleted

## 2019-03-14 NOTE — Patient Outreach (Signed)
[facility]) Care Management  03/14/2019  Wanda Kane May 31, 1961 [id number]  Transition of care call   Referral received: 03/11/19 Initial outreach: 03/14/19 Insurance: [place]   Subjective: Initial successful telephone call to patient's preferred number in order to complete transition of care assessment; 2 HIPAA identifiers verified. Explained purpose of call and completed transition of care assessment.  Wanda Kane states she is doing well, states abdominal  pain well managed with 1/2 tablet of prescribed medication (Tramadol) , tolerating  diet but has questions about a meal plan for those recovering from pancreatitis , denies bowel or bladder problems.  She reports she is checking her blood sugar before and after meals and reports premeal values around 140 and post values <150. She says she has not any lows at home but she did have an early morning hypoglycemic episode while she was hospitalized.   Objective:  Wanda Kane  was hospitalized at [facility] from 4/22-4/25/2020 for pancreatitis. Comorbidities include: HTN, Type 2 DM, HIV, obesity,  Hyperlipidemia, DVT with IVC filter She was discharged to home on 03/12/19 without the need for home health services or DME.   Assessment:  Patient voices good understanding of all discharge instructions.  See transition of care flowsheet for assessment details.   Plan:  Reviewed post hospital discharge treatment plan for recovery from pancreatitis and ongoing self management of Type 2 DM, HTN, hyperlipidemia, and HIV; the importance of medication adherence and follow up with primary care and specialists. Informed her she has a My Chart message requesting she schedule an EVISIT with Dr. Tommy Kane. Also e-mailed her a diet plan for those recovering from pancreatitis.  After discussing the two programs [place] offers for diabetes self management assistance Wanda Kane ask for referral to [facility]. E-mail sent to [employer] to ensure Wanda Kane's smartphone is compatible with the most recent [place] update. Also securely emailed Wanda Kane the steps to qualify for the [place] 2021 Healthy lifestyle premium.  No ongoing care management needs identified so will close case to Wanda Kane Management care management services and route successful outreach letter with [place] Management pamphlet and 24 Hour Nurse Line Magnet to [place] Management clinical pool to be mailed to patient's home address.   Wanda Ellison RN,CCM,CDE [place] Management Coordinator Office Phone [phone] Office Fax [phone]

## 2019-03-15 ENCOUNTER — Encounter: Payer: Self-pay | Admitting: *Deleted

## 2019-03-15 ENCOUNTER — Other Ambulatory Visit: Payer: Self-pay | Admitting: *Deleted

## 2019-03-15 NOTE — Patient Outreach (Signed)
[facility]) Care Management  03/15/2019  Yina Riviere [birth year] [id number]  Care Coordination  Received reply e-mail from [place], CDE,  [facility] Clinical Solutions Manager, that [employer] phone is compatible with the Careers information officer. Almyra Free states she will reach out to [facility] to enroll her in the program to assist her with ongoing Typ[e 2 DM self management.   Barrington Ellison RN,CCM,CDE [place] Management Coordinator Office Phone [phone] Office Fax [phone]

## 2019-03-17 ENCOUNTER — Encounter: Payer: Self-pay | Admitting: Infectious Disease

## 2019-03-17 ENCOUNTER — Other Ambulatory Visit: Payer: Self-pay

## 2019-03-17 ENCOUNTER — Encounter: Payer: Self-pay | Admitting: Internal Medicine

## 2019-03-17 ENCOUNTER — Telehealth: Payer: Self-pay | Admitting: Internal Medicine

## 2019-03-17 ENCOUNTER — Ambulatory Visit (INDEPENDENT_AMBULATORY_CARE_PROVIDER_SITE_OTHER): Payer: No Typology Code available for payment source | Admitting: Infectious Disease

## 2019-03-17 DIAGNOSIS — B20 Human immunodeficiency virus [HIV] disease: Secondary | ICD-10-CM | POA: Diagnosis not present

## 2019-03-17 DIAGNOSIS — K8500 Idiopathic acute pancreatitis without necrosis or infection: Secondary | ICD-10-CM

## 2019-03-17 DIAGNOSIS — E119 Type 2 diabetes mellitus without complications: Secondary | ICD-10-CM | POA: Diagnosis not present

## 2019-03-17 NOTE — Progress Notes (Signed)
Virtual Visit via Telephone Note  I connected with Wanda Kane on 03/17/19 at 10:30 AM EDT by telephone and verified that I am speaking with Wanda correct person using two identifiers.  Location: Patient: Home Provider: [facility]   I discussed Wanda limitations, risks, security and privacy concerns of performing an evaluation and management service by telephone and Wanda availability of in person appointments. I also discussed with Wanda patient that there may be a patient responsible charge related to this service. Wanda patient expressed understanding and agreed to proceed.   History of Present Illness:  Wanda Kane is a 58 year-old [place] American lady with HIV previous he cared for Wanda Kane and then moved to [place]. She had been on various regimens in 1990s and then on drug holiday. Later while in [place] she apparently was on various antiretroviral regimens lastly on Wanda Kane and Wanda Kane.   She moved back was seen by Wanda Kane in February. Her regimen was simplified to Wanda Kane. She had tolerated this regimen without to much complaint other some nausea and loose stools.   However week prior to her seeing me in March 2014 she had  developed increasingly severe diffuse myalgias in her neck arms legs. She had similar symptoms in Wanda past while out on antiretrovirals. She could not initially recall which antiretrovirals at apparently been blamed for Wanda symptoms. However when I examined her allergy history he or she is listed as being allergic to tenofovir with alleged   tenofovir-induced rhabdomyolysis.  I  therefore crafted a new ARV regimen for her without TNF, namely once daily Wanda Kane, Wanda Kane and Wanda Kane. I  checked her for elevated CPK  Which was at 237. Since change to new TNF free regimen she had down titration of her CK to normal but then  back above 200 again.   She had initially not been seen by Rheum due to lack of insurance.  I offered further Rheum workup with labs was  unrevealing  We discussed option of going to 2 drug regimen of Wanda Kane (DTG/RPV) She then made  Wanda switch to Wanda Kane but had been continuing her Lamivudine as well which is not what I was trying to trial with her, rather I was moving to treat her based on what Wanda Kane is FDA approved for with 2 drug regimen for stably suppressed pts.   She was definitely switched to Wanda Kane and she was very happy with this regimen.   She has been perfectly suppressed on this regimen.  I talked to her via an ED visit of Wanda 16th 2020.  Apparently Wanda next day but developed back pain with radiation into her abdomen.  She was wondering if this was due to her having a poor mattress and continue to have this pain.  This progressed into Wanda weekend and then after eating takeout food on Sunday night suddenly developed severe abdominal pain with nausea and foamy emesis.  Her cramping abdominal pain and vomiting and persisted and she came to Wanda emergency department at [facility].   It was a long she was diagnosed with pancreatitis with labs showing a lipase of thousand 812.  CT scan of Wanda abdomen pelvis which showed acute inflammation of Wanda pancreas and some inflammation of Wanda adjacent duodenum.  There is not any mention of gallstones on Wanda study.  Wanda ER physician wanted to admit her but Wanda Kane had a nursing exam that she wanted to attend to that was that same morning on Monday.  ER physician  speculated that this could be a drug-induced pancreatitis though drug-induced pancreatitis from her antiretrovirals is very rare as it is some of her other medications.  She did start Lamisil in Wanda last several months Wanda other new thing is she had tried to do a low-carb diet over Wanda last 3 months and more recently had been eating more carbohydrates.  She does as mentioned above have a history of myositis that was diagnosed in Wanda Kane and blamed on tenofovir but clearly was not due to tenofovir because  she was on cidofovir for years.  She might have some type of autoimmune pathology  In any case while she was at home she did not improve and was not able to manage her pain with Vicodin nor her nausea with Phenergan.  I checked on her on last Monday via telephone and again today via telephone.  I felt strongly she should come to Wanda Kane for admission.  I arrange for her to be admitted last week to Wanda hospitalist service and she was evaluated by GI.  It was felt that her pancreatitis was either idiopathic or drug-induced.  She did reinitiate Wanda Kane as an inpatient but then stopped it when she went back home.  She is eating actual meals yet again which would be compatible with taking Wanda Kane.  We had an extensive discussion with regards to whether we should change her regimen entirely versus continue Wanda rechallenged with Wanda Kane since it seemed fairly unlikely to me that either of these drugs was likely actually causing her pancreatitis.  After extensive discussion just she decided to continue back on [place] with possibility of revisiting a different agent down Wanda road if she has recurrence of her pancreatitis I did want to ensure that she is eating food and this is not exacerbating her pancreatitis as we could get a false impression of what was triggering her pancreatitis she is resuming a diet to rapidly.       Observations/Objective:  HIV disease well-controlled having been on Wanda Kane  Pancreatitis: Of uncertain origin idiopathic versus drug-induced  Prior history of rhabdomyolysis: That was blamed on tenofovir but then she was continued on tenofovir based regimens for years afterwards   Assessment and Plan:  HIV disease we will continue rechallenge with Wanda Kane.  If she develops pancreatitis and it seems related causally to her regimen I may put her on a protease-based inhibitor regimen versus an NNRTI based regimen believing that Wanda integrates strand transfer hit him in her  might be Wanda more likely culprit since elevations in lipase have been observed with this agent.  Pancreatitis: See above, could also have been related to diet, supplement. 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  Certainly should not obstruct our choice of regimen.    Follow Up Instructions:    I discussed Wanda assessment and treatment plan with Wanda patient. Wanda patient was provided an opportunity to ask questions and all were answered. Wanda patient agreed with Wanda plan and demonstrated an understanding of Wanda instructions.   Wanda patient was advised to call back or seek an in-person evaluation if Wanda symptoms worsen or if Wanda condition fails to improve as anticipated.  I provided 22 minutes of non-face-to-face time during this encounter.   Alcide Evener, MD

## 2019-03-17 NOTE — Telephone Encounter (Signed)
Patient dropped off paperwork for PCP to complete. Will drop off in PCP box

## 2019-03-18 NOTE — Telephone Encounter (Signed)
Pt sent a MyChart message and stated she will have ICD specialist fill out her FMLA because she has been following up with them

## 2019-03-21 ENCOUNTER — Inpatient Hospital Stay: Payer: No Typology Code available for payment source | Admitting: Family Medicine

## 2019-03-21 ENCOUNTER — Other Ambulatory Visit: Payer: Self-pay | Admitting: Infectious Disease

## 2019-03-21 DIAGNOSIS — B20 Human immunodeficiency virus [HIV] disease: Secondary | ICD-10-CM

## 2019-03-23 ENCOUNTER — Other Ambulatory Visit: Payer: Self-pay

## 2019-03-23 ENCOUNTER — Encounter: Payer: Self-pay | Admitting: Internal Medicine

## 2019-03-23 ENCOUNTER — Ambulatory Visit: Payer: No Typology Code available for payment source | Admitting: Internal Medicine

## 2019-03-23 NOTE — Progress Notes (Signed)
Pt rescheduled

## 2019-03-24 ENCOUNTER — Ambulatory Visit: Payer: Self-pay | Admitting: Internal Medicine

## 2019-03-28 ENCOUNTER — Other Ambulatory Visit: Payer: Self-pay

## 2019-03-28 ENCOUNTER — Other Ambulatory Visit: Payer: Self-pay | Admitting: Infectious Disease

## 2019-03-28 ENCOUNTER — Ambulatory Visit: Payer: No Typology Code available for payment source | Attending: Internal Medicine | Admitting: Internal Medicine

## 2019-03-28 DIAGNOSIS — D72829 Elevated white blood cell count, unspecified: Secondary | ICD-10-CM | POA: Diagnosis not present

## 2019-03-28 DIAGNOSIS — K760 Fatty (change of) liver, not elsewhere classified: Secondary | ICD-10-CM

## 2019-03-28 DIAGNOSIS — IMO0001 Reserved for inherently not codable concepts without codable children: Secondary | ICD-10-CM

## 2019-03-28 DIAGNOSIS — R590 Localized enlarged lymph nodes: Secondary | ICD-10-CM

## 2019-03-28 DIAGNOSIS — Z8719 Personal history of other diseases of the digestive system: Secondary | ICD-10-CM

## 2019-03-28 DIAGNOSIS — K8500 Idiopathic acute pancreatitis without necrosis or infection: Secondary | ICD-10-CM

## 2019-03-28 DIAGNOSIS — E7800 Pure hypercholesterolemia, unspecified: Secondary | ICD-10-CM

## 2019-03-28 DIAGNOSIS — B20 Human immunodeficiency virus [HIV] disease: Secondary | ICD-10-CM

## 2019-03-28 DIAGNOSIS — Z09 Encounter for follow-up examination after completed treatment for conditions other than malignant neoplasm: Secondary | ICD-10-CM

## 2019-03-28 DIAGNOSIS — Z21 Asymptomatic human immunodeficiency virus [HIV] infection status: Secondary | ICD-10-CM

## 2019-03-28 DIAGNOSIS — E1165 Type 2 diabetes mellitus with hyperglycemia: Secondary | ICD-10-CM

## 2019-03-28 MED FILL — JULUCA 50-25 MG TAB: 50-25 | 30 days supply | Qty: 30 | Fill #0

## 2019-03-28 NOTE — Progress Notes (Signed)
Virtual Visit via Telephone Note Due to current restrictions/limitations of in-office visits due to the COVID-19 pandemic, this scheduled clinical appointment was converted to a telehealth visit  I connected with Wanda Kane on 03/28/19 at 11:31 a.m EDT by telephone and verified that I am speaking with the correct person using two identifiers. I am in my office.  The patient is at home.  Only the patient and myself participated in this encounter.  I discussed the limitations, risks, security and privacy concerns of performing an evaluation and management service by telephone and the availability of in person appointments. I also discussed with the patient that there may be a patient responsible charge related to this service. The patient expressed understanding and agreed to proceed.   History of Present Illness: 58 yr old AAF with hx ofHIV(followed by ID with undetected viral load), HTN, DM, obesity, fibroid, HL,  pap with pos HPV, DVT with filter.Last seen 12/2018.  Since last visit with me, patient was hospitalized 4/22- 25/2020 with acute pancreatitis thought to be due to HIV med Frannie.  Ca+ level and TG levels were nl.  Pt does not drink ETOH beverages. Pt had been on oral Lamisil for about 2 mths at this point.  1 of the rare side effects of Lamisil is that it can cause pancreatitis.  CT of abdomen revealed findings of acute pancreatitis.  MRCP revealed non-complicated pancreatitis, hepatic steatosis and prominent low right axillary node.  Ultrasound of the abdomen revealed fatty infiltration of the liver.  Since hospitalization, patient has been seen by her ID specialist Dr. Drucilla Schmidt.  Restarted on [place] but pain started increasing again so patient stopped the medication.  She states that they are moving to plan B in terms of medication for the HIV. Eating a lot of fruits and veggies, soups, no red meat. Drinks yogart smoothie  She is apprehensive about adding any fat. Appetite still down  since pancreatitis.   -taking only MV  DM:  A1C in hosp was 7.3 down from 8.1.  BS in the hosp was less than 150 for the most part. Checking BS BID at home.  BS after meals have been less than 160.  Before BF 111, -does not have a scale but feels she has loss some wgh.  Thinks she is down into the 220s from 231 lb on last visit with me.   -does some calisthenics in the house   Observations/Objective: Lab Results  Component Value Date   WBC 16.4 (H) 03/12/2019   HGB 13.0 03/12/2019   HCT 38.4 03/12/2019   MCV 86.5 03/12/2019   PLT 265 03/12/2019     Chemistry      Component Value Date/Time   NA 136 03/12/2019 0428   NA 144 11/16/2018 0956   K 3.6 03/12/2019 0428   CL 97 (L) 03/12/2019 0428   CO2 23 03/12/2019 0428   BUN 5 (L) 03/12/2019 0428   BUN 11 11/16/2018 0956   CREATININE 0.87 03/12/2019 0428   CREATININE 0.92 02/08/2019 0950      Component Value Date/Time   CALCIUM 9.4 03/12/2019 0428   ALKPHOS 92 03/11/2019 0636   AST 48 (H) 03/11/2019 0636   ALT 41 03/11/2019 0636   BILITOT 1.6 (H) 03/11/2019 0636   BILITOT 0.5 11/16/2018 0956     Lab Results  Component Value Date   CHOL 241 (H) 03/07/2019   HDL 52 03/07/2019   LDLCALC 165 (H) 03/07/2019   TRIG 119 03/07/2019   CHOLHDL 4.6  03/07/2019     Assessment and Plan: 1. Hospital discharge follow-up 2. History of acute pancreatitis -Patient to stay off Lamisil.  She is working with her infectious disease specialist to find another medication for management of HIV.  3. Diabetes mellitus type 2, uncontrolled, without complications ([place]) Patient advised that sometimes acute pancreatitis in patients with diabetes can herald the need for insulin therapy.  However she did not have DKA with this acute pancreatitis and her blood sugars are at goal and A1c has come down. -Patient wants to continue with diet therapy and exercise.  She feels that she can get her A1c to goal by our next visit. - CBC; Future - Basic  Metabolic Panel; Future  4. Pure hypercholesterolemia LDL not at goal.  Patient wishes to hold off on starting medication.  She feels that she will get her LDL at goal with the changes she has made in her eating habits.  5. Fatty liver Discussed the importance of weight loss  6. Lymphadenopathy, axillary - [place] AXILLA RIGHT; Future  7.  Leukocytosis CBC with differential  8.  HIV Followed by infectious disease  Follow Up Instructions: F/u in 6-8 wks for PAP   I discussed the assessment and treatment plan with the patient. The patient was provided an opportunity to ask questions and all were answered. The patient agreed with the plan and demonstrated an understanding of the instructions.   The patient was advised to call back or seek an in-person evaluation if the symptoms worsen or if the condition fails to improve as anticipated.  I provided 27 minutes of non-face-to-face time during this encounter.   Karle Plumber, MD

## 2019-03-29 ENCOUNTER — Encounter: Payer: Self-pay | Admitting: Internal Medicine

## 2019-03-29 ENCOUNTER — Other Ambulatory Visit: Payer: No Typology Code available for payment source

## 2019-03-29 DIAGNOSIS — B20 Human immunodeficiency virus [HIV] disease: Secondary | ICD-10-CM

## 2019-03-29 DIAGNOSIS — K8500 Idiopathic acute pancreatitis without necrosis or infection: Secondary | ICD-10-CM

## 2019-03-30 ENCOUNTER — Ambulatory Visit (INDEPENDENT_AMBULATORY_CARE_PROVIDER_SITE_OTHER): Payer: No Typology Code available for payment source | Admitting: Infectious Disease

## 2019-03-30 ENCOUNTER — Encounter: Payer: Self-pay | Admitting: Internal Medicine

## 2019-03-30 ENCOUNTER — Other Ambulatory Visit: Payer: Self-pay

## 2019-03-30 ENCOUNTER — Encounter (HOSPITAL_COMMUNITY): Payer: Self-pay | Admitting: Emergency Medicine

## 2019-03-30 ENCOUNTER — Encounter: Payer: Self-pay | Admitting: Infectious Disease

## 2019-03-30 ENCOUNTER — Ambulatory Visit (HOSPITAL_COMMUNITY)
Admission: EM | Admit: 2019-03-30 | Discharge: 2019-03-30 | Disposition: A | Discharge status: Home / Self Care | Payer: No Typology Code available for payment source | Attending: Emergency Medicine | Admitting: Emergency Medicine

## 2019-03-30 DIAGNOSIS — K8500 Idiopathic acute pancreatitis without necrosis or infection: Secondary | ICD-10-CM

## 2019-03-30 DIAGNOSIS — E119 Type 2 diabetes mellitus without complications: Secondary | ICD-10-CM | POA: Diagnosis not present

## 2019-03-30 DIAGNOSIS — B20 Human immunodeficiency virus [HIV] disease: Secondary | ICD-10-CM | POA: Diagnosis not present

## 2019-03-30 DIAGNOSIS — I10 Essential (primary) hypertension: Secondary | ICD-10-CM | POA: Diagnosis not present

## 2019-03-30 DIAGNOSIS — R739 Hyperglycemia, unspecified: Secondary | ICD-10-CM | POA: Diagnosis not present

## 2019-03-30 LAB — T-HELPER CELL (CD4) - (RCID CLINIC ONLY)
CD4 % Helper T Cell: 21 % — ABNORMAL LOW (ref 33–65)
CD4 T Cell Abs: 667 /uL (ref 400–1790)

## 2019-03-30 MED ORDER — DARUN-COBIC-EMTRICIT-TENOFAF 800-150-200-10 MG PO TABS: 1.0000 | ORAL_TABLET | Freq: Every day | ORAL | 11 refills | Status: DC

## 2019-03-30 NOTE — ED Provider Notes (Signed)
HPI  SUBJECTIVE:  Wanda Kane is a 58 y.o. female who presents with 2 days of elevated blood sugar readings.  It was 268 on formal lab work yesterday and read 290 today, 1 hour prior to evaluation.  States that it has been in the 160s over the past week.  Checks her glucose once daily.  She reports some dietary indiscretions over the past 2 days and also reports that she is under a lot of stress.  States that she has been eating a lot more carbs recently.  Otherwise, she states that she feels fine.  She is eating well.  She denies excess thirst, polyuria, unintentional weight loss, fevers, flulike symptoms, abdominal pain, chest pain, shortness of breath, urinary complaints.  No aggravating or alleviating factors.  She has not tried anything for this.  She was recently diagnosed with diabetes back in December 2019 or February 2020, but she is not yet on any medication, has been trying to control it with diet and exercise with some improvement in her A1c from 8.1 to 7.3.  She also has a past medical history of HIV, CD4 count 667, viral load undetectable, hypertension, DVT with an IVC, and a recent bout of pancreatitis which was thought to possibly be idiopathic versus due to Lamisil versus herbal supplements.  She denies any other recent change in her medications although amlodipine is going to be adjusted from 10 to 5 mg because of interaction with her HIV medications.  This was done in conjunction with infectious disease today.  POE:UMPNTIR, Dalbert Batman, MD    Past Medical History:  Diagnosis Date  . DVT (deep venous thrombosis) ([place]) Jan 2016  . Fibroids 2010  . Genital herpes 08/12/2017  . HIV infection ([place]) 1994  . Hypertension 1994  . Medical history non-contributory   . 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 Pancreatitis 03/09/2019  . Pancreatitis   . Pre-diabetes   . Type 2 diabetes mellitus with hyperglycemia ([place]) 03/03/2019   diagnosed in December 2019  . Vaginal bleeding 12/2014   due to xarelto     Past Surgical History:  Procedure Laterality Date  . CESAREAN SECTION  1983  . CESAREAN SECTION    . TONSILLECTOMY AND ADENOIDECTOMY      Family History  Problem Relation Age of Onset  . Hypertension Mother   . CAD Brother   . Heart disease Brother 17       cardiac arrest    Social History   Tobacco Use  . Smoking status: Never Smoker  . Smokeless tobacco: Never Used  Substance Use Topics  . Alcohol use: No  . Drug use: No    No current facility-administered medications for this encounter.   Current Outpatient Medications:  .  AMLODIPINE BESYLATE PO, Take by mouth., Disp: , Rfl:  .  acetaminophen (TYLENOL) 500 MG tablet, Take 1,000 mg by mouth every 6 (six) hours as needed for mild pain, moderate pain or headache., Disp: , Rfl:  .  B-Complex-C TABS, Take 1 tablet by mouth daily., Disp: , Rfl:  .  Blood Glucose Monitoring Suppl (TRUE METRIX METER) w/Device KIT, Check sugar once a day, Disp: 1 kit, Rfl: 0 .  Darunavir-Cobicisctat-Emtricitabine-Tenofovir Alafenamide (SYMTUZA) 800-150-200-10 MG TABS, Take 1 tablet by mouth daily with breakfast., Disp: 30 tablet, Rfl: 11 .  glucose blood (TRUE METRIX BLOOD GLUCOSE TEST) test strip, Check sugar once a day, Disp: 100 each, Rfl: 12 .  lisinopril (PRINIVIL,ZESTRIL) 10 MG tablet, TAKE 1 TABLET (10 MG TOTAL) BY  MOUTH DAILY., Disp: 90 tablet, Rfl: 0 .  Multiple Vitamin (MULTIVITAMIN) capsule, Take 1 capsule by mouth daily., Disp: , Rfl:  .  spironolactone (ALDACTONE) 100 MG tablet, TAKE 1 TABLET (100 MG TOTAL) BY MOUTH DAILY., Disp: 90 tablet, Rfl: 0 .  TRUEPLUS LANCETS 28G MISC, Check sugar once a day, Disp: 100 each, Rfl: 12  Allergies  Allergen Reactions  . Pepcid [Famotidine] Other (See Comments)  . Prilosec [Omeprazole] Other (See Comments)  . Viread [Tenofovir Disoproxil] Other (See Comments)    Rhabdomyolysis     ROS  As noted in HPI.   Physical Exam  BP 122/65 (BP Location: Left Arm)   Pulse (!) 108   Temp  99.7 F (37.6 C) (Oral)   Resp 18   LMP 05/31/2016 (Approximate)   SpO2 98%   Constitutional: Well developed, well nourished, no acute distress Eyes:  EOMI, conjunctiva normal bilaterally HENT: Normocephalic, atraumatic,mucus membranes moist Respiratory: Normal inspiratory effort, lungs clear bilaterally, good air movement Cardiovascular: Mild regular tachycardia, no murmurs rubs or gallops GI: Soft, nontender, nondistended, no rebound, guarding.  Active bowel sounds skin: No rash, skin intact Musculoskeletal: no deformities Neurologic: Alert & oriented x 3, no focal neuro deficits Psychiatric: Speech and behavior appropriate   ED Course   Medications - No data to display  No orders of the defined types were placed in this encounter.   No results found for this or any previous visit (from the past 24 hour(s)). No results found.  ED Clinical Impression  Hyperglycemia  Type 2 diabetes mellitus without complication, without long-term current use of insulin [facility])   ED Assessment/Plan  Outside records reviewed.  As noted in HPI.  Patient with 2 days of hyperglycemia.  Doubt DKA, current pancreatitis or an infection causing the hyperglycemia.  Did not repeat glucose as we have recent labs and she checked her sugar 1 hour prior to arrival.  She otherwise has no complaints, her vitals are reassuring, her physical exam is normal.  Could be due to to eating more carbs and sweets over the past few days.  Patient states that she can call her doctor tomorrow and discuss this with her, and discuss the possibility of starting medication versus continued observation.  Patient appears reliable and has ready access to her physician, so I think that this is reasonable.  We discussed other dietary options and discussed the possibility of her talking to a dietitian to construct a sustainable diet.  Discussed MDM, treatment plan, and plan for follow-up with patient. Discussed sn/sx that should  prompt return to the ED. patient agrees with plan.   No orders of the defined types were placed in this encounter.   *This clinic note was created using Dragon dictation software. Therefore, there may be occasional mistakes despite careful proofreading.   ?   Melynda Ripple, MD 03/30/19 2033

## 2019-03-30 NOTE — Progress Notes (Signed)
Virtual Visit via Telephone Note  I connected with Wanda Kane on 03/30/19 at 11:15 AM EDT by telephone and verified that I am speaking with the correct person using two identifiers.  Location: Patient: Home Provider: [facility]   I discussed the limitations, risks, security and privacy concerns of performing an evaluation and management service by telephone and the availability of in person appointments. I also discussed with the patient that there may be a patient responsible charge related to this service. The patient expressed understanding and agreed to proceed.   History of Present Illness:  Wanda Kane is a 58 year-old [place] American lady with HIV previous he cared for [facility] and then moved to [place]. She had been on various regimens in 1990s and then on drug holiday. Later while in [place] she apparently was on various antiretroviral regimens lastly on Isentress and complera.   She moved back was seen by Dr. Baxter Flattery in February. Her regimen was simplified to STRIBILD. She had tolerated this regimen without to much complaint other some nausea and loose stools.   However week prior to her seeing me in March 2014 she had  developed increasingly severe diffuse myalgias in her neck arms legs. She had similar symptoms in the past while out on antiretrovirals. She could not initially recall which antiretrovirals at apparently been blamed for the symptoms. However when I examined her allergy history he or she is listed as being allergic to tenofovir with alleged   tenofovir-induced rhabdomyolysis.  I  therefore crafted a new ARV regimen for her without TNF, namely once daily Tivicay, Edurant and Epivir. I  checked her for elevated CPK  Which was at 237. Since change to new TNF free regimen she had down titration of her CK to normal but then  back above 200 again.   She had initially not been seen by Rheum due to lack of insurance.  I offered further Rheum workup with labs was  unrevealing  We discussed option of going to 2 drug regimen of JULUCA (DTG/RPV) She then made  the switch to [facility] but had been continuing her Lamivudine as well which is not what I was trying to trial with her, rather I was moving to treat her based on what JULUCA is FDA approved for with 2 drug regimen for stably suppressed pts.   She was definitely switched to [place] alone and she was very happy with this regimen.   She has been perfectly suppressed on this regimen.  I talked to her via an ED visit of the 16th 2020.  Apparently the next day but developed back pain with radiation into her abdomen.  She was wondering if this was due to her having a poor mattress and continue to have this pain.  This progressed into the weekend and then after eating takeout food on Sunday night suddenly developed severe abdominal pain with nausea and foamy emesis.  Her cramping abdominal pain and vomiting and persisted and she came to the emergency department at [facility].   It was a long she was diagnosed with pancreatitis with labs showing a lipase of thousand 812.  CT scan of the abdomen pelvis which showed acute inflammation of the pancreas and some inflammation of the adjacent duodenum.  There is not any mention of gallstones on the study.  The ER physician wanted to admit her but [place] had a nursing exam that she wanted to attend to that was that same morning on Monday.  ER physician  speculated that this could be a drug-induced pancreatitis though drug-induced pancreatitis from her antiretrovirals is very rare as it is some of her other medications.  She did start Lamisil in the last several months the other new thing is she had tried to do a low-carb diet over the last 3 months and more recently had been eating more carbohydrates.  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  She might have some type of autoimmune pathology  In any case while she was at home she did not improve and was not able to manage her pain with Vicodin nor her nausea with Phenergan.  I checked on her on last Monday via telephone and again today via telephone.  I felt strongly she should come to the hospital for admission.  I arrange for her to be admitted last week to the hospitalist service and she was evaluated by GI.  It was felt that her pancreatitis was either idiopathic or drug-induced.  She did reinitiate JULUCA as an inpatient but then stopped it when she went back home.  She is eating actual meals yet again which would be compatible with taking JULUCA.  We had an extensive discussion with regards to whether we should change her regimen entirely versus continue the rechallenged with JULUCA since it seemed fairly unlikely to me that either of these drugs was likely actually causing her pancreatitis.  After extensive discussion just she decided at last visit  to continue back on [place] with possibility of revisiting a different agent down the road if she has recurrence of her pancreatitis I did want to ensure that she is eating food and this is not exacerbating her pancreatitis as we could get a false impression of what was triggering her pancreatitis she is resuming a diet to rapidly.   In the interim she has had return of abdominal pain and has stopped her Juluca.  Her lipase was also elevated yesterday on lab testing      Observations/Objective:  HIV disease well-controlled having been on JULUCA  Pancreatitis: Of uncertain origin idiopathic versus drug-induced  Prior history of rhabdomyolysis: That was blamed on tenofovir but then she was continued on tenofovir based regimens for years afterwards  HTN   Assessment and Plan:  HIV disease: we will change to [facility] to avoid INSTI and NNRTI and recheck labs in next 1-2  months  Pancreatitis: See above, Changing to PI based regimen FMLA: will redo paperwork once sent in re time missed from work due to pancreatitis  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  Certainly should not obstruct our choice of regimen.  HTN: will adjust amlodipine to 5mg  given interaction w COBI    Follow Up Instructions:    I discussed the assessment and treatment plan with the patient. The patient was provided an opportunity to ask questions and all were answered. The patient agreed with the plan and demonstrated an understanding of the instructions.   The patient was advised to call back or seek an in-person evaluation if the symptoms worsen or if the condition fails to improve as anticipated.  I provided 24 minutes of non-face-to-face time during this encounter.   Alcide Evener, MD

## 2019-03-30 NOTE — ED Triage Notes (Signed)
Patient has chronic issues.  Recently diagnosed with diabetes.  Patient checks cbg at home-290 was reading today.  Patient reports this reading approx one hour ago.

## 2019-03-30 NOTE — Discharge Instructions (Signed)
Call your doctor tomorrow.  You may need to start a medication, such as metformin.  However, this may just be due to a few dietary indiscretions. go back to lean proteins, low-fat dairy, fresh fruits and vegetables.  Talk to a dietitian as soon as you can to construct a sustainable diet that will help keep your sugar under control.

## 2019-03-31 ENCOUNTER — Encounter: Payer: Self-pay | Admitting: Internal Medicine

## 2019-03-31 ENCOUNTER — Telehealth: Payer: Self-pay | Admitting: Pharmacy Technician

## 2019-03-31 MED FILL — SYMTUZA 800-150-200-10 MG T: 800-150-200 | 30 days supply | Qty: 30 | Fill #0

## 2019-03-31 NOTE — Telephone Encounter (Signed)
[facility] Patient Advocate Encounter   Was successful in obtaining a [employer] copay card for [name].  This copay card will make the patients copay 0.  I have spoken with the patient.    The billing information is as follows and has been shared with [name].  RxBin: [id number] PCN: [id number] Member ID: [id number] Group ID: [id number]  [name] Patient [name] Phone: [phone] Fax:  [phone]

## 2019-04-01 ENCOUNTER — Encounter: Payer: Self-pay | Admitting: Internal Medicine

## 2019-04-01 MED ORDER — METFORMIN HCL 500 MG PO TABS: 500.0000 mg | ORAL_TABLET | Freq: Every day | ORAL | 3 refills | Status: DC

## 2019-04-01 MED FILL — metFORMIN HCL 500 MG TABS: 500 | 30 days supply | Qty: 30 | Fill #0

## 2019-04-05 LAB — COMPLETE METABOLIC PANEL WITH GFR
AG Ratio: 1.5 (calc) (ref 1.0–2.5)
ALT: 24 U/L (ref 6–29)
AST: 22 U/L (ref 10–35)
Albumin: 4.4 g/dL (ref 3.6–5.1)
Alkaline phosphatase (APISO): 115 U/L (ref 37–153)
BUN: 12 mg/dL (ref 7–25)
CO2: 26 mmol/L (ref 20–32)
Calcium: 10 mg/dL (ref 8.6–10.4)
Chloride: 99 mmol/L (ref 98–110)
Creat: 0.81 mg/dL (ref 0.50–1.05)
GFR, Est African American: 93 mL/min/{1.73_m2} (ref >=60)
GFR, Est Non African American: 80 mL/min/{1.73_m2} (ref >=60)
Globulin: 3 g/dL (calc) (ref 1.9–3.7)
Glucose, Bld: 268 mg/dL — ABNORMAL HIGH (ref 65–99)
Potassium: 4.2 mmol/L (ref 3.5–5.3)
Sodium: 136 mmol/L (ref 135–146)
Total Bilirubin: 0.4 mg/dL (ref 0.2–1.2)
Total Protein: 7.4 g/dL (ref 6.1–8.1)

## 2019-04-05 LAB — CBC WITH DIFFERENTIAL/PLATELET
Absolute Monocytes: 431 cells/uL (ref 200–950)
Basophils Absolute: 39 cells/uL (ref 0–200)
Basophils Relative: 0.4 %
Eosinophils Absolute: 59 cells/uL (ref 15–500)
Eosinophils Relative: 0.6 %
HCT: 42.8 % (ref 35.0–45.0)
Hemoglobin: 14.4 g/dL (ref 11.7–15.5)
Lymphs Abs: 3616 cells/uL (ref 850–3900)
MCH: 29.3 pg (ref 27.0–33.0)
MCHC: 33.6 g/dL (ref 32.0–36.0)
MCV: 87 fL (ref 80.0–100.0)
MPV: 12.1 fL (ref 7.5–12.5)
Monocytes Relative: 4.4 %
Neutro Abs: 5655 cells/uL (ref 1500–7800)
Neutrophils Relative %: 57.7 %
Platelets: 343 10*3/uL (ref 140–400)
RBC: 4.92 10*6/uL (ref 3.80–5.10)
RDW: 12.6 % (ref 11.0–15.0)
Total Lymphocyte: 36.9 %
WBC: 9.8 10*3/uL (ref 3.8–10.8)

## 2019-04-05 LAB — RPR: RPR Ser Ql: NONREACTIVE

## 2019-04-05 LAB — TEST AUTHORIZATION

## 2019-04-05 LAB — LIPASE: Lipase: 65 U/L — ABNORMAL HIGH (ref 7–60)

## 2019-04-05 LAB — HIV-1 RNA QUANT-NO REFLEX-BLD
HIV 1 RNA Quant: <20 copies/mL
HIV-1 RNA Quant, Log: <1.3 Log copies/mL

## 2019-04-06 ENCOUNTER — Encounter: Payer: Self-pay | Admitting: Internal Medicine

## 2019-04-13 ENCOUNTER — Other Ambulatory Visit: Payer: Self-pay

## 2019-04-13 ENCOUNTER — Ambulatory Visit (INDEPENDENT_AMBULATORY_CARE_PROVIDER_SITE_OTHER): Payer: No Typology Code available for payment source | Admitting: Infectious Disease

## 2019-04-13 ENCOUNTER — Encounter: Payer: Self-pay | Admitting: Infectious Disease

## 2019-04-13 ENCOUNTER — Encounter: Payer: Self-pay | Admitting: Internal Medicine

## 2019-04-13 DIAGNOSIS — E661 Drug-induced obesity: Secondary | ICD-10-CM | POA: Diagnosis not present

## 2019-04-13 DIAGNOSIS — Z6839 Body mass index (BMI) 39.0-39.9, adult: Secondary | ICD-10-CM

## 2019-04-13 DIAGNOSIS — I10 Essential (primary) hypertension: Secondary | ICD-10-CM

## 2019-04-13 DIAGNOSIS — B20 Human immunodeficiency virus [HIV] disease: Secondary | ICD-10-CM

## 2019-04-13 DIAGNOSIS — K8500 Idiopathic acute pancreatitis without necrosis or infection: Secondary | ICD-10-CM | POA: Diagnosis not present

## 2019-04-13 DIAGNOSIS — Z111 Encounter for screening for respiratory tuberculosis: Secondary | ICD-10-CM

## 2019-04-13 NOTE — Progress Notes (Signed)
Patient would like to discuss alternated medication.  Current ART making her nausea and patient would rather not have anything for nausea.

## 2019-04-13 NOTE — Progress Notes (Signed)
Virtual Visit via Telephone Note  I connected with Wanda Kane on 04/13/19 at  9:30 AM EDT by telephone and verified that I am speaking with the correct person using two identifiers.  Location: Patient: Home Provider: [facility]   I discussed the limitations, risks, security and privacy concerns of performing an evaluation and management service by telephone and the availability of in person appointments. I also discussed with the patient that there may be a patient responsible charge related to this service. The patient expressed understanding and agreed to proceed.   History of Present Illness: Wanda Kane is a 58year-old African American lady with HIV previous he cared for [facility] and then moved to [place]. She had been on various regimens in 1990s and then on drug holiday. Later while in [place] she apparently was on various antiretroviral regimens lastly on Isentress and complera.  She moved back was seen by Dr. Baxter Flattery in February. Her regimen was simplified to STRIBILD. She had tolerated this regimen without to much complaint other some nausea and loose stools.   However week prior to her seeing me in March 2014 she had developed increasingly severe diffuse myalgias in her neck arms legs. She had similar symptoms in the past while out on antiretrovirals. She could not initially recall which antiretrovirals at apparently been blamed for the symptoms. However when I examined her allergy history he or she is listed as being allergic to tenofovir with alleged  tenofovir-induced rhabdomyolysis.  I therefore crafted a new ARV regimen for her without TNF, namely once daily Tivicay, Edurant and Epivir. I checked her for elevated CPK Which was at 237. Since change to new TNF free regimen she had down titration of her CK to normal but then back above 200 again.   She had initially not been seen by Rheum due to lack of insurance.  I offered further Rheum workup with labs  was unrevealing  We discussed option of going to 2 drug regimen of JULUCA (DTG/RPV)She then madethe switch to JULUCA but had been continuing her Lamivudine as well which is not what I was trying to trial with her, rather I was moving to treat her based on what JULUCA is FDA approved for with 2 drug regimen for stably suppressed pts.   She was definitely switched to [name] alone and shewasvery happy with this regimen.   She has been perfectly suppressed on this regimen. I talked to her via an ED visit of the 16th 2020. Apparently the next day but developed back pain with radiation into her abdomen. She was wondering if this was due to her having a poor mattress and continue to have this pain. This progressed into the weekend and then after eating takeout food on Sunday night suddenly developed severe abdominal pain with nausea and foamy emesis. Her cramping abdominal pain and vomiting and persisted and she came to the emergency department at [facility].   It was a long she was diagnosed with pancreatitis with labs showing a lipase of thousand 812.  CT scan of the abdomen pelvis which showed acute inflammation of the pancreas and some inflammation of the adjacent duodenum. There is not any mention of gallstones on the study.  The ER physician wanted to admit her but Wanda Kane had a nursing exam that she wanted to attend to that was that same morning on Monday.  ER physician speculated that this could be a drug-induced pancreatitis though drug-induced pancreatitis from her antiretrovirals is very rare as  it is some of her other medications. She did start Lamisil in the last several months the other new thing is she had tried to do a low-carb diet over the last 3 months and more recently had been eating more carbohydrates.  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  She might have some type of autoimmune pathology  In any case while she was at home she did not improve and was not able to manage her pain with Vicodin nor her nausea with Phenergan.  I checked on her on last Monday via telephone and again today via telephone. I felt strongly she should come to the hospital for admission.  I arrange for her to be admitted last week to the hospitalist service and she was evaluated by GI.  It was felt that her pancreatitis was either idiopathic or drug-induced.  She did reinitiate JULUCA as an inpatient but then stopped it when she went back home.  She is eating actual meals yet again which would be compatible with taking JULUCA.  We had an extensive discussion with regards to whether we should change her regimen entirely versus continue the rechallenged with JULUCA since it seemed fairly unlikely to me that either of these drugs was likely actually causing her pancreatitis.  After extensive discussion just she decided to continue back on [place] with possibility of revisiting a different agent down the road if she has recurrence of her pancreatitis I did want to ensure that she is eating food and this is not exacerbating her pancreatitis as we could get a false impression of what was triggering her pancreatitis she is resuming a diet to rapidly.  In the interim after restarting JULUCA she again experienced increased abdominal pain and her lipase was again increased.  She stopped the [name].  We made a decision to change over to [facility] which she has been on since then.  Initially she was having quite a bit of trouble with nausea vomiting and loose stools.  She called last week about this with concerns we discussed changing to a different regimen potentially.  Over the weekend she found that by increasing the amount of calories that she eats when she takes a Symtuza she was able to greatly improve the nausea and is now  able to tolerate it.  She still has some loose stools but the nausea is dramatically improved.  We had discussions about potential causes of pancreatitis including whether this was due to the integrates strand transfer inhibitor whether is due to the non-nucleotide reverse transcript of an ACE inhibitor and whether it was worth trialing 1 of these classes again or not.  I think that the best thing for now is to continue forward with a protease inhibitor based regimen.  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  Her HIV is perfectly controlled.  We will plan on rechecking her labs in roughly 2 months time.  She will be moving to [place] to help take care of her father who is in his 82s in August.   Observations/Objective:  HIV disease well-controlled  Pancreatitis: Potentially drug-induced potentially due to an integrates strand transfer inhibitor versus a non-nucleoside reverse transcript ACE inhibitor difficult to elucidate  Obesity  Hypertension  Diabetes mellitus  Assessment and Plan:  Kidney disease: Continue Symtuza recheck labs in 2 months time  Hypertension continue antihypertensives  Diabetes mellitus continue metformin and follow-up with primary care physician.  Obesity carbohydrate restriction has worked for her well in the past.    Follow Up Instructions:    I discussed the assessment and treatment plan with the patient. The patient was provided an opportunity to ask questions and all were answered. The patient agreed with the plan and demonstrated an understanding of the instructions.   The patient was advised to call back or seek an in-person evaluation if the symptoms worsen or if the condition fails to improve as anticipated.  I provided 21 minutes of non-face-to-face time during this encounter.   Alcide Evener, MD

## 2019-04-14 ENCOUNTER — Ambulatory Visit: Payer: No Typology Code available for payment source | Attending: Internal Medicine

## 2019-04-14 ENCOUNTER — Other Ambulatory Visit: Payer: Self-pay

## 2019-04-14 ENCOUNTER — Encounter: Payer: Self-pay | Admitting: Internal Medicine

## 2019-04-14 DIAGNOSIS — D72829 Elevated white blood cell count, unspecified: Secondary | ICD-10-CM

## 2019-04-14 DIAGNOSIS — IMO0001 Reserved for inherently not codable concepts without codable children: Secondary | ICD-10-CM

## 2019-04-19 ENCOUNTER — Encounter: Payer: Self-pay | Admitting: Internal Medicine

## 2019-04-20 ENCOUNTER — Encounter: Payer: Self-pay | Admitting: *Deleted

## 2019-04-21 ENCOUNTER — Encounter: Payer: Self-pay | Admitting: Internal Medicine

## 2019-04-21 ENCOUNTER — Other Ambulatory Visit: Payer: Self-pay | Admitting: Internal Medicine

## 2019-04-21 ENCOUNTER — Other Ambulatory Visit: Payer: Self-pay

## 2019-04-21 ENCOUNTER — Ambulatory Visit: Payer: No Typology Code available for payment source | Attending: Family Medicine

## 2019-04-21 DIAGNOSIS — Z111 Encounter for screening for respiratory tuberculosis: Secondary | ICD-10-CM

## 2019-04-21 NOTE — Progress Notes (Signed)
quan

## 2019-04-26 MED FILL — SYMTUZA 800-150-200-10 MG T: 800-150-200 | 30 days supply | Qty: 30 | Fill #1

## 2019-04-27 ENCOUNTER — Encounter: Payer: Self-pay | Admitting: Internal Medicine

## 2019-04-27 LAB — QUANTIFERON-TB GOLD PLUS
QuantiFERON Mitogen Value: >10 IU/mL
QuantiFERON Nil Value: 0.04 IU/mL
QuantiFERON TB1 Ag Value: 0.03 IU/mL
QuantiFERON TB2 Ag Value: 0.04 IU/mL
QuantiFERON-TB Gold Plus: NEGATIVE

## 2019-04-27 NOTE — Telephone Encounter (Signed)
Please assist in patient obtaining the printed results.

## 2019-05-02 ENCOUNTER — Other Ambulatory Visit: Payer: No Typology Code available for payment source

## 2019-05-03 ENCOUNTER — Other Ambulatory Visit: Payer: No Typology Code available for payment source

## 2019-05-03 ENCOUNTER — Other Ambulatory Visit: Payer: Self-pay

## 2019-05-03 DIAGNOSIS — I10 Essential (primary) hypertension: Secondary | ICD-10-CM

## 2019-05-03 DIAGNOSIS — K8500 Idiopathic acute pancreatitis without necrosis or infection: Secondary | ICD-10-CM

## 2019-05-03 DIAGNOSIS — B20 Human immunodeficiency virus [HIV] disease: Secondary | ICD-10-CM

## 2019-05-04 LAB — T-HELPER CELL (CD4) - (RCID CLINIC ONLY)
CD4 % Helper T Cell: 20 % — ABNORMAL LOW (ref 33–65)
CD4 T Cell Abs: 1000 /uL (ref 400–1790)

## 2019-05-07 LAB — COMPLETE METABOLIC PANEL WITH GFR
AG Ratio: 1.6 (calc) (ref 1.0–2.5)
ALT: 18 U/L (ref 6–29)
AST: 18 U/L (ref 10–35)
Albumin: 4.6 g/dL (ref 3.6–5.1)
Alkaline phosphatase (APISO): 99 U/L (ref 37–153)
BUN: 16 mg/dL (ref 7–25)
CO2: 24 mmol/L (ref 20–32)
Calcium: 10.1 mg/dL (ref 8.6–10.4)
Chloride: 100 mmol/L (ref 98–110)
Creat: 1.01 mg/dL (ref 0.50–1.05)
GFR, Est African American: 71 mL/min/{1.73_m2} (ref >=60)
GFR, Est Non African American: 61 mL/min/{1.73_m2} (ref >=60)
Globulin: 2.9 g/dL (calc) (ref 1.9–3.7)
Glucose, Bld: 163 mg/dL — ABNORMAL HIGH (ref 65–99)
Potassium: 4.7 mmol/L (ref 3.5–5.3)
Sodium: 133 mmol/L — ABNORMAL LOW (ref 135–146)
Total Bilirubin: 0.4 mg/dL (ref 0.2–1.2)
Total Protein: 7.5 g/dL (ref 6.1–8.1)

## 2019-05-07 LAB — CBC WITH DIFFERENTIAL/PLATELET
Absolute Monocytes: 610 cells/uL (ref 200–950)
Basophils Absolute: 45 cells/uL (ref 0–200)
Basophils Relative: 0.4 %
Eosinophils Absolute: 79 cells/uL (ref 15–500)
Eosinophils Relative: 0.7 %
HCT: 40.6 % (ref 35.0–45.0)
Hemoglobin: 14.2 g/dL (ref 11.7–15.5)
Lymphs Abs: 5797 cells/uL — ABNORMAL HIGH (ref 850–3900)
MCH: 29.8 pg (ref 27.0–33.0)
MCHC: 35 g/dL (ref 32.0–36.0)
MCV: 85.1 fL (ref 80.0–100.0)
MPV: 11.4 fL (ref 7.5–12.5)
Monocytes Relative: 5.4 %
Neutro Abs: 4769 cells/uL (ref 1500–7800)
Neutrophils Relative %: 42.2 %
Platelets: 349 10*3/uL (ref 140–400)
RBC: 4.77 10*6/uL (ref 3.80–5.10)
RDW: 13.1 % (ref 11.0–15.0)
Total Lymphocyte: 51.3 %
WBC: 11.3 10*3/uL — ABNORMAL HIGH (ref 3.8–10.8)

## 2019-05-07 LAB — HIV-1 RNA QUANT-NO REFLEX-BLD
HIV 1 RNA Quant: <20 copies/mL
HIV-1 RNA Quant, Log: <1.3 Log copies/mL

## 2019-05-12 ENCOUNTER — Encounter: Payer: Self-pay | Admitting: Internal Medicine

## 2019-05-12 ENCOUNTER — Other Ambulatory Visit: Payer: Self-pay | Admitting: Certified Nurse Midwife

## 2019-05-12 DIAGNOSIS — Z1231 Encounter for screening mammogram for malignant neoplasm of breast: Secondary | ICD-10-CM

## 2019-05-16 ENCOUNTER — Ambulatory Visit (INDEPENDENT_AMBULATORY_CARE_PROVIDER_SITE_OTHER): Payer: No Typology Code available for payment source | Admitting: Infectious Disease

## 2019-05-16 ENCOUNTER — Encounter: Payer: Self-pay | Admitting: Infectious Disease

## 2019-05-16 ENCOUNTER — Other Ambulatory Visit: Payer: Self-pay

## 2019-05-16 ENCOUNTER — Telehealth: Payer: Self-pay | Admitting: Pharmacy Technician

## 2019-05-16 DIAGNOSIS — E782 Mixed hyperlipidemia: Secondary | ICD-10-CM

## 2019-05-16 DIAGNOSIS — E662 Morbid (severe) obesity with alveolar hypoventilation: Secondary | ICD-10-CM

## 2019-05-16 DIAGNOSIS — E1165 Type 2 diabetes mellitus with hyperglycemia: Secondary | ICD-10-CM | POA: Diagnosis not present

## 2019-05-16 DIAGNOSIS — K8500 Idiopathic acute pancreatitis without necrosis or infection: Secondary | ICD-10-CM | POA: Diagnosis not present

## 2019-05-16 DIAGNOSIS — B351 Tinea unguium: Secondary | ICD-10-CM

## 2019-05-16 DIAGNOSIS — Z6839 Body mass index (BMI) 39.0-39.9, adult: Secondary | ICD-10-CM

## 2019-05-16 MED ORDER — DORAVIRINE 100 MG PO TABS: 100.0000 mg | ORAL_TABLET | Freq: Every day | ORAL | 11 refills | Status: DC

## 2019-05-16 MED ORDER — EMTRICITABINE-TENOFOVIR AF 200-25 MG PO TABS: 1.0000 | ORAL_TABLET | Freq: Every day | ORAL | 11 refills | Status: DC

## 2019-05-16 NOTE — Progress Notes (Signed)
Virtual Visit via Telephone Note  I connected with [name] on 05/16/19 at  8:45 AM EDT by telephone and verified that I am speaking with the correct person using two identifiers.  Location: Patient: Home Provider: [facility]   I discussed the limitations, risks, security and privacy concerns of performing an evaluation and management service by telephone and the availability of in person appointments. I also discussed with the patient that there may be a patient responsible charge related to this service. The patient expressed understanding and agreed to proceed.   History of Present Illness: Ms Bacigalupi is a 58year-old African American lady with HIV previous he cared for [facility] and then moved to [place]. She had been on various regimens in 1990s and then on drug holiday. Later while in [place] she apparently was on various antiretroviral regimens lastly on Isentress and complera.  She moved back was seen by Dr. Baxter Flattery in February. Her regimen was simplified to STRIBILD. She had tolerated this regimen without to much complaint other some nausea and loose stools.   However week prior to her seeing me in March 2014 she had developed increasingly severe diffuse myalgias in her neck arms legs. She had similar symptoms in the past while out on antiretrovirals. She could not initially recall which antiretrovirals at apparently been blamed for the symptoms. However when I examined her allergy history he or she is listed as being allergic to tenofovir with alleged  tenofovir-induced rhabdomyolysis.  I therefore crafted a new ARV regimen for her without TNF, namely once daily Tivicay, Edurant and Epivir. I checked her for elevated CPK Which was at 237. Since change to new TNF free regimen she had down titration of her CK to normal but then back above 200 again.   She had initially not been seen by Rheum due to lack of insurance.  I offered further Rheum workup with labs  was unrevealing  We discussed option of going to 2 drug regimen of JULUCA (DTG/RPV)She then madethe switch to JULUCA but had been continuing her Lamivudine as well which is not what I was trying to trial with her, rather I was moving to treat her based on what JULUCA is FDA approved for with 2 drug regimen for stably suppressed pts.   She was definitely switched to [place] alone and shewasvery happy with this regimen.   She has been perfectly suppressed on this regimen. I talked to her via an ED visit of the 16th 2020. Apparently the next day but developed back pain with radiation into her abdomen. She was wondering if this was due to her having a poor mattress and continue to have this pain. This progressed into the weekend and then after eating takeout food on Sunday night suddenly developed severe abdominal pain with nausea and foamy emesis. Her cramping abdominal pain and vomiting and persisted and she came to the emergency department at [facility].   It was a long she was diagnosed with pancreatitis with labs showing a lipase of thousand 812.  CT scan of the abdomen pelvis which showed acute inflammation of the pancreas and some inflammation of the adjacent duodenum. There is not any mention of gallstones on the study.  The ER physician wanted to admit her but Netty had a nursing exam that she wanted to attend to that was that same morning on Monday.  ER physician speculated that this could be a drug-induced pancreatitis though drug-induced pancreatitis from her antiretrovirals is very rare as  it is some of her other medications. She did start Lamisil in the last several months the other new thing is she had tried to do a low-carb diet over the last 3 months and more recently had been eating more carbohydrates.  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  She might have some type of autoimmune pathology  In any case while she was at home she did not improve and was not able to manage her pain with Vicodin nor her nausea with Phenergan.  I checked on her on last Monday via telephone and again today via telephone. I felt strongly she should come to the hospital for admission.  I arrange for her to be admitted last week to the hospitalist service and she was evaluated by GI.  It was felt that her pancreatitis was either idiopathic or drug-induced.  She did reinitiate JULUCA as an inpatient but then stopped it when she went back home.  She is eating actual meals yet again which would be compatible with taking JULUCA.  We had an extensive discussion with regards to whether we should change her regimen entirely versus continue the rechallenged with JULUCA since it seemed fairly unlikely to me that either of these drugs was likely actually causing her pancreatitis.  After extensive discussion just she decided to continue back on [place] with possibility of revisiting a different agent down the road if she has recurrence of her pancreatitis I did want to ensure that she is eating food and this is not exacerbating her pancreatitis as we could get a false impression of what was triggering her pancreatitis she is resuming a diet to rapidly.  In the interim after restarting JULUCA she again experienced increased abdominal pain and her lipase was again increased.  She stopped the [place].  We made a decision to change over to [facility] which she has been on since then.  Initially she was having quite a bit of trouble with nausea vomiting and loose stools.  She called last week about this with concerns we discussed changing to a different regimen potentially.  Over the weekend she found that by increasing the amount of calories that she eats when she takes a Symtuza she was able to greatly improve the nausea and is now  able to tolerate it.  She still has some loose stools but the nausea is dramatically improved.  We had discussions about potential causes of pancreatitis including whether this was due to the integrates strand transfer inhibitor whether is due to the non-nucleotide reverse transcript of an ACE inhibitor and whether it was worth trialing 1 of these classes again or not.  She is now tolerating Symtuza better than before with less nausea and diarrhea but she continues to have bloating.  She tells me she wishes to go back onto the [place].  I proposed alternative of placing her on Doravarine (Pifeltro) and Descovy, though the NNRTI could potentially also potentially cause pancreatitis and really this was due to repetitive rain.  My suspicion is that the JULUCA had nothing to do with her pancreatitis but when she rechallenge herself she did have symptoms.  We will plan on rechecking her labs in 79month's time.    Observations/Objective:  HIV disease well-controlled  Pancreatitis: Potentially drug-induced potentially due to an integrates strand transfer inhibitor versus a non-nucleoside reverse transcript ACE  inhibitor difficult to elucidate  Obesity  Hypertension  Diabetes mellitus  Hyperlipidemia:  Assessment and Plan:  HIV disease we will change to Pifeltro and Descovy and we will recheck labs in 1 month's time  Hypertension continue antihypertensives  Diabetes mellitus continue metformin and follow-up with primary care physician  Hyperlipidemia: She should be on a statin by labs but she does not want to be on it because she is read about people having myositis and had friends who have had intolerance to the drug class.  I informed her that the drug carries a mortality benefit that was seen across a population benefit and I would advocate for her to try it but she is not ready to do so at this time.  Obesity carbohydrate restriction has worked for her well in the past.    Follow Up  Instructions:    I discussed the assessment and treatment plan with the patient. The patient was provided an opportunity to ask questions and all were answered. The patient agreed with the plan and demonstrated an understanding of the instructions.   The patient was advised to call back or seek an in-person evaluation if the symptoms worsen or if the condition fails to improve as anticipated.  I provided 22 minutes of non-face-to-face time during this encounter.   Alcide Evener, MD

## 2019-05-16 NOTE — Telephone Encounter (Addendum)
[facility] Patient Advocate Encounter  I have called twice without a voicemail this morning to ask Ms. Trosper about first fills of Descovy and Pifeltro.  I need to ask if she wants to pick up the medications or have them mailed.  If she wants them mailed we must confirm address before shipping.  Copay assistance was gotten for: Pifeltro ID [id number] [facility] [id number] PCN loyalty GRP [id number]  And Descovy ID [id number] BIN [id number] PCN ACCESS GRP [id number]  [place] will call 1 week before next refill is due.  Venida Jarvis. [name] [place] Patient [facility] for Infectious Disease Phone: [phone] Fax:  [phone]

## 2019-05-19 ENCOUNTER — Other Ambulatory Visit: Payer: No Typology Code available for payment source | Admitting: Internal Medicine

## 2019-05-25 MED FILL — PIFELTRO 100 MG TABS: 100 | 30 days supply | Qty: 30 | Fill #0

## 2019-05-25 MED FILL — DESCOVY 200-25 MG TABS: 200-25 | 30 days supply | Qty: 30 | Fill #0

## 2019-06-08 MED FILL — metFORMIN HCL 500 MG TABS: 500 | 30 days supply | Qty: 30 | Fill #1

## 2019-06-14 ENCOUNTER — Ambulatory Visit: Payer: No Typology Code available for payment source | Admitting: Infectious Disease

## 2019-06-15 ENCOUNTER — Other Ambulatory Visit: Payer: No Typology Code available for payment source

## 2019-06-15 ENCOUNTER — Other Ambulatory Visit: Payer: Self-pay | Admitting: *Deleted

## 2019-06-15 DIAGNOSIS — B20 Human immunodeficiency virus [HIV] disease: Secondary | ICD-10-CM

## 2019-06-16 ENCOUNTER — Other Ambulatory Visit: Payer: No Typology Code available for payment source

## 2019-06-16 ENCOUNTER — Other Ambulatory Visit: Payer: Self-pay

## 2019-06-16 DIAGNOSIS — B20 Human immunodeficiency virus [HIV] disease: Secondary | ICD-10-CM

## 2019-06-17 LAB — T-HELPER CELL (CD4) - (RCID CLINIC ONLY)
CD4 % Helper T Cell: 17 % — ABNORMAL LOW (ref 33–65)
CD4 T Cell Abs: 806 /uL (ref 400–1790)

## 2019-06-21 ENCOUNTER — Other Ambulatory Visit: Payer: No Typology Code available for payment source | Admitting: Internal Medicine

## 2019-06-21 MED FILL — DESCOVY 200-25 MG TABS: 200-25 | 30 days supply | Qty: 30 | Fill #1

## 2019-06-21 MED FILL — PIFELTRO 100 MG TABS: 100 | 30 days supply | Qty: 30 | Fill #1

## 2019-06-24 ENCOUNTER — Telehealth: Payer: Self-pay | Admitting: *Deleted

## 2019-06-24 LAB — HIV-1 RNA QUANT-NO REFLEX-BLD
HIV 1 RNA Quant: 1080 copies/mL — ABNORMAL HIGH
HIV-1 RNA Quant, Log: 3.03 Log copies/mL — ABNORMAL HIGH

## 2019-06-24 NOTE — Telephone Encounter (Signed)
Spoke with patient. She first thought it was a 3-4 day gap before starting her 2 dose medications (she was worried about her lymphocytes/labs, had lost her father), but now thinks it was a 3-4 week gap.  She started descovy/pifeltro on/around 7/27, has not missed a dose since then.  She follows up in person on 8/19.  Landis Gandy, RN

## 2019-06-24 NOTE — Telephone Encounter (Signed)
-----   Message from Truman Hayward, MD sent at 06/24/2019 10:47 AM EDT ----- Did Wanda Kane stop her ARVs?

## 2019-06-24 NOTE — Telephone Encounter (Signed)
Ok that makes sense I will check her labs when I see her again

## 2019-06-28 ENCOUNTER — Ambulatory Visit: Payer: No Typology Code available for payment source

## 2019-07-06 ENCOUNTER — Encounter: Payer: No Typology Code available for payment source | Admitting: Infectious Disease

## 2019-07-12 ENCOUNTER — Other Ambulatory Visit: Payer: Self-pay

## 2019-07-12 ENCOUNTER — Other Ambulatory Visit: Payer: No Typology Code available for payment source

## 2019-07-12 ENCOUNTER — Other Ambulatory Visit: Payer: Self-pay | Admitting: *Deleted

## 2019-07-12 DIAGNOSIS — B20 Human immunodeficiency virus [HIV] disease: Secondary | ICD-10-CM

## 2019-07-13 ENCOUNTER — Ambulatory Visit (INDEPENDENT_AMBULATORY_CARE_PROVIDER_SITE_OTHER): Payer: No Typology Code available for payment source | Admitting: Infectious Disease

## 2019-07-13 ENCOUNTER — Encounter: Payer: Self-pay | Admitting: Infectious Disease

## 2019-07-13 DIAGNOSIS — E662 Morbid (severe) obesity with alveolar hypoventilation: Secondary | ICD-10-CM

## 2019-07-13 DIAGNOSIS — B20 Human immunodeficiency virus [HIV] disease: Secondary | ICD-10-CM | POA: Diagnosis not present

## 2019-07-13 DIAGNOSIS — Z6839 Body mass index (BMI) 39.0-39.9, adult: Secondary | ICD-10-CM

## 2019-07-13 DIAGNOSIS — I10 Essential (primary) hypertension: Secondary | ICD-10-CM

## 2019-07-13 DIAGNOSIS — E1165 Type 2 diabetes mellitus with hyperglycemia: Secondary | ICD-10-CM | POA: Diagnosis not present

## 2019-07-13 DIAGNOSIS — K8500 Idiopathic acute pancreatitis without necrosis or infection: Secondary | ICD-10-CM | POA: Diagnosis not present

## 2019-07-13 NOTE — Progress Notes (Signed)
Virtual Visit via Telephone Note  I connected with Wanda Kane on 07/13/19 at 11:15 AM EDT by telephone and verified that I am speaking with the correct person using two identifiers.  Location: Patient: Home Provider: [facility]   I discussed the limitations, risks, security and privacy concerns of performing an evaluation and management service by telephone and the availability of in person appointments. I also discussed with the patient that there may be a patient responsible charge related to this service. The patient expressed understanding and agreed to proceed.   History of Present Illness: Wanda Kane is a 58year-old African American lady with HIV previous he cared for [facility] and then moved to [place]. She had been on various regimens in 1990s and then on drug holiday. Later while in [place] she apparently was on various antiretroviral regimens lastly on Isentress and complera.  She moved back was seen by Dr. Baxter Flattery and regimen was simplified to [facility]. She had tolerated this regimen without to much complaint other some nausea and loose stools.   However week prior to her seeing me in March 2014 she had developed increasingly severe diffuse myalgias in her neck arms legs. She had similar symptoms in the past while out on antiretrovirals. She could not initially recall which antiretrovirals at apparently been blamed for the symptoms. However when I examined her allergy history he or she is listed as being allergic to tenofovir with alleged  tenofovir-induced rhabdomyolysis.  I therefore crafted a new ARV regimen for her without TNF, namely once daily Tivicay, Edurant and Epivir. I checked her for elevated CPK Which was at 237. Since change to new TNF free regimen she had down titration of her CK to normal but then back above 200 again.   She had initially not been seen by Rheum due to lack of insurance.  I offered further Rheum workup with labs was  unrevealing  We discussed option of going to 2 drug regimen of JULUCA (DTG/RPV)She then madethe switch to JULUCA but had been continuing her Lamivudine as well which is not what I was trying to trial with her, rather I was moving to treat her based on what JULUCA is FDA approved for with 2 drug regimen for stably suppressed pts.   She was definitely switched to [place] alone and shewasvery happy with this regimen.   She has been perfectly suppressed on this regimen. I talked to her via an ED visit of the 16th 2020. Apparently the next day but developed back pain with radiation into her abdomen. She was wondering if this was due to her having a poor mattress and continue to have this pain. This progressed into the weekend and then after eating takeout food on Sunday night suddenly developed severe abdominal pain with nausea and foamy emesis. Her cramping abdominal pain and vomiting and persisted and she came to the emergency department at [facility].   It was a long she was diagnosed with pancreatitis with labs showing a lipase of thousand 812.  CT scan of the abdomen pelvis which showed acute inflammation of the pancreas and some inflammation of the adjacent duodenum. There is not any mention of gallstones on the study.  The ER physician wanted to admit her but Wanda Kane had a nursing exam that she wanted to attend to that was that same morning on Monday.  ER physician speculated that this could be a drug-induced pancreatitis though drug-induced pancreatitis from her antiretrovirals is very rare as it is some  of her other medications. She did start Lamisil in the last several months the other new thing is she had tried to do a low-carb diet over the last 3 months and more recently had been eating more carbohydrates.  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  She might have some type of autoimmune pathology  In any case while she was at home she did not improve and was not able to manage her pain with Vicodin nor her nausea with Phenergan.  I checked on her via telephone twice. I felt strongly she should come to the hospital for admission.  I arranged for her to be admitted to the hospitalist service and she was evaluated by GI.  It was felt that her pancreatitis was either idiopathic or drug-induced.  She did reinitiate JULUCA as an inpatient but then stopped it when she went back home.  She was eating actual meals yet again which would be compatible with taking JULUCA.  We had an extensive discussion with regards to whether we should change her regimen entirely versus continue the rechallenged with JULUCA since it seemed fairly unlikely to me that either of these drugs was likely actually causing her pancreatitis.  After extensive discussion just she decided to continue back on [name] with possibility of revisiting a different agent down the road if she has recurrence of her pancreatitis I did want to ensure that she is eating food and this is not exacerbating her pancreatitis as we could get a false impression of what was triggering her pancreatitis she is resuming a diet to rapidly.  In the interim after restarting JULUCA she again experienced increased abdominal pain and her lipase was again increased.  She stopped the [place].  We made a decision to change over to [facility] which she has been on since then.  Initially she was having quite a bit of trouble with nausea vomiting and loose stools.  She calledwith concerns we discussed changing to a different regimen potentially.  Over the weekend she found that by increasing the amount of calories that she eats when she takes a Symtuza she was able to greatly improve the nausea and is now able to tolerate it.  She still has some loose stools but the nausea is  dramatically improved.  We had discussions about potential causes of pancreatitis including whether this was due to the integrates strand transfer inhibitor whether is due to the non-nucleotide reverse transcript of an ACE inhibitor and whether it was worth trialing 1 of these classes again or not.  She was  tolerating Symtuza better than before with less nausea and diarrhea but she continues to have bloating.  She told me she wished to go back onto the [place].  I proposed alternative of placing her on Doravarine (Pifeltro) and Descovy, though the NNRTI could potentially also potentially cause pancreatitis and really this was due to repetitive rain.  My suspicion is that the JULUCA had nothing to do with her pancreatitis but when she rechallenge herself she did have symptoms.  She tolerated this regimen quite well though she did not initiated immediately.  Apparently she is not taking medications at all stressors including managing the affairs of her father.  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  She did have some diarrhea last week which helped her out of work for 2 days.  She is going to get tested for novel coronavirus 2019 quarantining in the interim    Observations/Objective:  HIV disease: Hopefully well controlled now that she is been taking her Pifeltro and Descovy have scheduled her an appointment for labs in March and she already had an appointment for me in March as well.    Pancreatitis: Potentially drug-induced potentially due to an integrates strand transfer inhibitor versus a non-nucleoside reverse transcript ACE inhibitor difficult to elucidate  Diarrhea  Obesity  Hypertension  Diabetes  mellitus  Hyperlipidemia:  Assessment and Plan:  HIV disease we will change to Pifeltro and Descovy and we will recheck labs in 1 month's time  Hypertension continue antihypertensives  Diabetes mellitus continue metformin and follow-up with primary care physician  Hyperlipidemia: She should be on a statin by labs but she does not want to be on it because she is read about people having myositis and had friends who have had intolerance to the drug class.  I informed her that the drug carries a mortality benefit that was seen across a population benefit and I would advocate for her to try it but she is not ready to do so at this time.  Obesity carbohydrate restriction has worked for her well in the past.  Diarrhea: Seems brief episode.  She is going to tested for COVID-19 due to some concerns for this.      Follow Up Instructions:    I discussed the assessment and treatment plan with the patient. The patient was provided an opportunity to ask questions and all were answered. The patient agreed with the plan and demonstrated an understanding of the instructions.   The patient was advised to call back or seek an in-person evaluation if the symptoms worsen or if the condition fails to improve as anticipated.  I provided 21 minutes of non-face-to-face time during this encounter.   Alcide Evener, MD

## 2019-07-16 LAB — HIV-1 RNA QUANT-NO REFLEX-BLD
HIV 1 RNA Quant: 196 copies/mL — ABNORMAL HIGH
HIV-1 RNA Quant, Log: 2.29 Log copies/mL — ABNORMAL HIGH

## 2019-07-18 ENCOUNTER — Other Ambulatory Visit: Payer: Self-pay | Admitting: Internal Medicine

## 2019-07-18 ENCOUNTER — Encounter: Payer: Self-pay | Admitting: Internal Medicine

## 2019-07-18 ENCOUNTER — Telehealth: Payer: Self-pay | Admitting: Internal Medicine

## 2019-07-18 DIAGNOSIS — I10 Essential (primary) hypertension: Secondary | ICD-10-CM

## 2019-07-18 MED FILL — DESCOVY 200-25 MG TABS: 200-25 | 30 days supply | Qty: 30 | Fill #2

## 2019-07-18 MED FILL — LISINOPRIL 10 MG TABS: 10 | 30 days supply | Qty: 30 | Fill #0

## 2019-07-18 MED FILL — SPIRONOLACTONE 100 MG TAB: 100 | 30 days supply | Qty: 30 | Fill #0

## 2019-07-18 MED FILL — PIFELTRO 100 MG TABS: 100 | 30 days supply | Qty: 30 | Fill #2

## 2019-07-18 MED FILL — AMLODIPINE BESYLATE 10 MG T: 10 | 30 days supply | Qty: 30 | Fill #0

## 2019-07-18 MED FILL — metFORMIN HCL 500 MG TABS: 500 | 30 days supply | Qty: 30 | Fill #2

## 2019-07-18 NOTE — Telephone Encounter (Signed)
Refill request if appropriate.

## 2019-07-18 NOTE — Telephone Encounter (Signed)
1) Medication(s) Requested (by name): Hypertension medication 2) Pharmacy of Choice: [facility] [place]  Patient called stating pharmacy has not received the courtesy refill. Please follow up

## 2019-07-19 ENCOUNTER — Encounter: Payer: Self-pay | Admitting: Internal Medicine

## 2019-07-19 NOTE — Telephone Encounter (Signed)
Medications refilled. Called pt to confirm she picked those up this morning from [facility].

## 2019-07-26 ENCOUNTER — Other Ambulatory Visit: Payer: No Typology Code available for payment source

## 2019-08-01 ENCOUNTER — Encounter: Payer: Self-pay | Admitting: Internal Medicine

## 2019-08-01 NOTE — Telephone Encounter (Signed)
Please assist in this concern

## 2019-08-10 ENCOUNTER — Other Ambulatory Visit: Payer: Self-pay | Admitting: Internal Medicine

## 2019-08-10 DIAGNOSIS — I10 Essential (primary) hypertension: Secondary | ICD-10-CM

## 2019-08-11 ENCOUNTER — Ambulatory Visit: Payer: No Typology Code available for payment source

## 2019-08-11 ENCOUNTER — Other Ambulatory Visit: Payer: Self-pay | Admitting: Internal Medicine

## 2019-08-11 ENCOUNTER — Other Ambulatory Visit: Payer: Self-pay

## 2019-08-11 DIAGNOSIS — Z1231 Encounter for screening mammogram for malignant neoplasm of breast: Secondary | ICD-10-CM

## 2019-08-11 DIAGNOSIS — Z20822 Contact with and (suspected) exposure to covid-19: Secondary | ICD-10-CM

## 2019-08-12 ENCOUNTER — Ambulatory Visit: Payer: No Typology Code available for payment source | Admitting: Internal Medicine

## 2019-08-12 LAB — NOVEL CORONAVIRUS, NAA: SARS-CoV-2, NAA: NOT DETECTED

## 2019-08-15 ENCOUNTER — Ambulatory Visit: Payer: No Typology Code available for payment source | Admitting: Infectious Disease

## 2019-08-18 MED FILL — metFORMIN HCL 500 MG TABS: 500 | 30 days supply | Qty: 30 | Fill #3

## 2019-08-18 MED FILL — SPIRONOLACTONE 100 MG TAB: 100 | 30 days supply | Qty: 30 | Fill #0

## 2019-08-18 MED FILL — LISINOPRIL 10 MG TABS: 10 | 30 days supply | Qty: 30 | Fill #0

## 2019-08-18 MED FILL — AMLODIPINE BESYLATE 10 MG T: 10 | 30 days supply | Qty: 30 | Fill #0

## 2019-08-18 MED FILL — DESCOVY 200-25 MG TABS: 200-25 | 30 days supply | Qty: 30 | Fill #3

## 2019-08-18 MED FILL — PIFELTRO 100 MG TABS: 100 | 30 days supply | Qty: 30 | Fill #3

## 2019-09-05 ENCOUNTER — Ambulatory Visit (HOSPITAL_BASED_OUTPATIENT_CLINIC_OR_DEPARTMENT_OTHER): Payer: No Typology Code available for payment source | Admitting: Internal Medicine

## 2019-09-05 ENCOUNTER — Other Ambulatory Visit: Payer: Self-pay

## 2019-09-05 DIAGNOSIS — Z5321 Procedure and treatment not carried out due to patient leaving prior to being seen by health care provider: Secondary | ICD-10-CM

## 2019-09-06 NOTE — Progress Notes (Signed)
Left before being seen.  Requested to reschedule/

## 2019-09-08 ENCOUNTER — Other Ambulatory Visit: Payer: Self-pay | Admitting: Internal Medicine

## 2019-09-08 DIAGNOSIS — I10 Essential (primary) hypertension: Secondary | ICD-10-CM

## 2019-09-13 MED FILL — metFORMIN HCL 500 MG TABS: 500 | 30 days supply | Qty: 30 | Fill #0

## 2019-09-13 MED FILL — LISINOPRIL 10 MG TABS: 10 | 30 days supply | Qty: 30 | Fill #0

## 2019-09-13 MED FILL — PIFELTRO 100 MG TABS: 100 | 30 days supply | Qty: 30 | Fill #4

## 2019-09-13 MED FILL — DESCOVY 200-25 MG TABS: 200-25 | 30 days supply | Qty: 30 | Fill #4

## 2019-09-13 MED FILL — SPIRONOLACTONE 100 MG TAB: 100 | 30 days supply | Qty: 30 | Fill #0

## 2019-09-13 MED FILL — AMLODIPINE BESYLATE 10 MG T: 10 | 30 days supply | Qty: 30 | Fill #0

## 2019-09-16 ENCOUNTER — Ambulatory Visit (HOSPITAL_BASED_OUTPATIENT_CLINIC_OR_DEPARTMENT_OTHER): Payer: No Typology Code available for payment source | Admitting: Internal Medicine

## 2019-09-16 ENCOUNTER — Other Ambulatory Visit: Payer: Self-pay

## 2019-09-16 ENCOUNTER — Other Ambulatory Visit (HOSPITAL_COMMUNITY)
Admission: RE | Admit: 2019-09-16 | Discharge: 2019-09-16 | Disposition: A | Discharge status: Home / Self Care | Payer: No Typology Code available for payment source | Source: Ambulatory Visit | Attending: Internal Medicine | Admitting: Internal Medicine

## 2019-09-16 ENCOUNTER — Encounter: Payer: Self-pay | Admitting: Internal Medicine

## 2019-09-16 VITALS — BP 117/81 | HR 92 | Temp 98.5°F | Resp 16 | Wt 233.8 lb

## 2019-09-16 DIAGNOSIS — E119 Type 2 diabetes mellitus without complications: Secondary | ICD-10-CM

## 2019-09-16 DIAGNOSIS — Z124 Encounter for screening for malignant neoplasm of cervix: Secondary | ICD-10-CM | POA: Insufficient documentation

## 2019-09-16 DIAGNOSIS — H01002 Unspecified blepharitis right lower eyelid: Secondary | ICD-10-CM

## 2019-09-16 DIAGNOSIS — Z6841 Body Mass Index (BMI) 40.0 and over, adult: Secondary | ICD-10-CM

## 2019-09-16 DIAGNOSIS — F4321 Adjustment disorder with depressed mood: Secondary | ICD-10-CM

## 2019-09-16 DIAGNOSIS — H00012 Hordeolum externum right lower eyelid: Secondary | ICD-10-CM

## 2019-09-16 LAB — POCT GLYCOSYLATED HEMOGLOBIN (HGB A1C): HbA1c, POC (controlled diabetic range): 8 % — AB (ref 0.0–7.0)

## 2019-09-16 LAB — GLUCOSE, POCT (MANUAL RESULT ENTRY): POC Glucose: 167 mg/dl — AB (ref 70–99)

## 2019-09-16 MED ORDER — POLYMYXIN B-TRIMETHOPRIM 10000-0.1 UNIT/ML-% OP SOLN: 1.0000 [drp] | Freq: Four times a day (QID) | OPHTHALMIC | 0 refills | Status: DC

## 2019-09-16 MED FILL — POLYMYXIN B/TMP EYE DROPS: 10000-0.1 | 50 days supply | Qty: 10 | Fill #0

## 2019-09-16 NOTE — Progress Notes (Signed)
Patient ID: Wanda Kane, female    DOB: Jul 14, 1961  MRN: [id number]  CC: Diabetes, Hypertension, and Gynecologic Exam   Subjective: Wanda Kane is a 58 y.o. female who presents for chronic ds management and Pap. 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  Work has been busy and has had 2 exposures to [place] but thankfully both times she tested negative.  Feels peaks and valleys with depression especially since her father is death.  She bakes as a coping mechanism  DM:  Admits to to eating more junk foods like chips and fast foods after dad's death BS range 150-170 Compliant with Metformin but only able to tolerate 1/2 pill of the 500 mg tablet due to GI upset.   Admits that she has not been as active as she should be.  Paranoid about going to gym due to Covid.    MMG scheduled for next wk PAP - no abn in the past. No dischg at this time Menopausal.  Still gets some hot flashes but very mild.  History of fibroids.  She no longer gets periods.  Wonders whether the fibroids need to be checked  Complains of having an infection or stye of the right lower eyelid times several days.  The lid is swollen.  Reports previous history of this.  She used some mascara that she thinks may have caused the inflammation has a sty RT eye.  Using eye wash and eye wipes, uncomfortable but not a lot of pain  Patient Active Problem List   Diagnosis Date Noted  . Fatty liver 03/28/2019  . Hypokalemia 03/11/2019  . Pancreatitis 03/09/2019  . 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 History of DVT (deep vein thrombosis) 03/09/2019  . Presence of IVC filter 03/09/2019  . Leukocytosis 03/09/2019  . Abdominal distention   . Type 2 diabetes mellitus with  hyperglycemia ([place]) 03/03/2019  . New onset type 2 diabetes mellitus ([place]) 12/21/2018  . Class 2 severe obesity with serious comorbidity and body mass index (BMI) of 39.0 to 39.9 in adult ([place]) 12/21/2018  . Hyperlipemia, mixed 12/21/2018  . Nail fungus 01/01/2018  . Genital herpes 08/12/2017  . Positive fecal immunochemical test 06/29/2017  . Pap smear of cervix shows high risk HPV present 06/22/2017  . HIV disease ([place]) 11/22/2015  . Hypertension 12/29/2014  . Perimenopause 10/26/2014  . Metabolic syndrome 03/50/0938  . Cutaneous skin tags 10/26/2014  . Back pain 05/16/2014  . Anemia 10/12/2013  . Myalgia 02/09/2013  . Condyloma acuminatum 10/27/2012  . Acquired acanthosis nigricans 10/27/2012  . Uterine fibroid 10/27/2012     Current Outpatient Medications on File Prior to Visit  Medication Sig Dispense Refill  . acetaminophen (TYLENOL) 500 MG tablet Take 1,000 mg by mouth every 6 (six) hours as needed for mild pain, moderate pain or headache.    Marland Kitchen amLODipine (NORVASC) 10 MG tablet TAKE 1 TABLET (10 MG TOTAL) BY MOUTH DAILY. 30 tablet 0  . B-Complex-C TABS Take 1 tablet by mouth daily.    . Blood Glucose Monitoring Suppl (TRUE METRIX METER) w/Device KIT Check sugar once a day 1 kit 0  . doravirine (PIFELTRO) 100 MG TABS tablet Take 1 tablet (100 mg total) by mouth  daily. 30 tablet 11  . emtricitabine-tenofovir AF (DESCOVY) 200-25 MG tablet Take 1 tablet by mouth daily. 30 tablet 11  . glucose blood (TRUE METRIX BLOOD GLUCOSE TEST) test strip Check sugar once a day 100 each 12  . lisinopril (ZESTRIL) 10 MG tablet TAKE 1 TABLET (10 MG TOTAL) BY MOUTH DAILY. 30 tablet 0  . metFORMIN (GLUCOPHAGE) 500 MG tablet TAKE 1 TABLET BY MOUTH DAILY WITH BREAKFAST 30 tablet 0  . Multiple Vitamin (MULTIVITAMIN) capsule Take 1 capsule by mouth daily.    Marland Kitchen spironolactone (ALDACTONE) 100 MG tablet TAKE 1 TABLET (100 MG TOTAL) BY MOUTH DAILY. 30 tablet 0  . TRUEPLUS LANCETS 28G MISC Check sugar once  a day 100 each 12   No current facility-administered medications on file prior to visit.     Allergies  Allergen Reactions  . Pepcid [Famotidine] Other (See Comments)  . Prilosec [Omeprazole] Other (See Comments)  . Viread [Tenofovir Disoproxil] Other (See Comments)    Rhabdomyolysis    Social History   Socioeconomic History  . Marital status: Divorced    Spouse name: Not on file  . Number of children: Not on file  . Years of education: college, [place]  . Highest education level: Not on file  Occupational History  . Occupation: Research scientist (life sciences): [employer]  Social Needs  . Financial resource strain: Not hard at all  . Food insecurity    Worry: Never true    Inability: Never true  . Transportation needs    Medical: No    Non-medical: No  Tobacco Use  . Smoking status: Never Smoker  . Smokeless tobacco: Never Used  Substance and Sexual Activity  . Alcohol use: No  . Drug use: No  . Sexual activity: Not Currently    Partners: Male    Comment: patient declined condoms  Lifestyle  . Physical activity    Days per week: Patient refused    Minutes per session: Patient refused  . Stress: To some extent  Relationships  . Social Herbalist on phone: Patient refused    Gets together: Patient refused    Attends religious service: Patient refused    Active member of club or organization: Patient refused    Attends meetings of clubs or organizations: Patient refused    Relationship status: Patient refused  . Intimate partner violence    Fear of current or ex partner: No    Emotionally abused: No    Physically abused: No    Forced sexual activity: No  Other Topics Concern  . 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   She loves her job   Has BSW   Working towards Research officer, political party    Family History  Problem Relation Age of Onset  . Hypertension Mother   . CAD Brother   . Heart disease Brother 11       cardiac  arrest    Past Surgical History:  Procedure Laterality Date  . CESAREAN SECTION  1983  . CESAREAN SECTION    . TONSILLECTOMY AND ADENOIDECTOMY      ROS: Review of Systems Negative except as stated above  PHYSICAL EXAM: BP 117/81   Pulse 92   Temp 98.5 F (36.9 C) (Oral)   Resp 16   Wt 233 lb 12.8 oz (106.1 kg)   LMP 05/31/2016 (Approximate)   SpO2 95%   BMI 40.13  kg/m   Physical Exam  General appearance - alert, well appearing, obese middle-age African-American female and in no distress Mental status - normal mood, behavior, speech, dress, motor activity, and thought processes Eyes -mild to moderate edema of the right lower lid.  She has moderate erythema on the inside of the lid.  Small palpable swelling noted on the midportion of the lid.  No drainage noted. Chest - clear to auscultation, no wheezes, rales or rhonchi, symmetric air entry Heart - normal rate, regular rhythm, normal S1, S2, no murmurs, rubs, clicks or gallops Breasts -my CMA Sallyanne Havers was present for breast exam and pelvic exam: Breasts appear normal, no suspicious masses, no skin or nipple changes or axillary nodes Pelvic - normal external genitalia, vulva, vagina, cervix, uterus and adnexa   CMP Latest Ref Rng & Units 05/03/2019 03/29/2019 03/12/2019  Glucose 65 - 99 mg/dL 163(H) 268(H) 148(H)  BUN 7 - 25 mg/dL 16 12 5(L)  Creatinine 0.50 - 1.05 mg/dL 1.01 0.81 0.87  Sodium 135 - 146 mmol/L 133(L) 136 136  Potassium 3.5 - 5.3 mmol/L 4.7 4.2 3.6  Chloride 98 - 110 mmol/L 100 99 97(L)  CO2 20 - 32 mmol/L 24 26 23   Calcium 8.6 - 10.4 mg/dL 10.1 10.0 9.4  Total Protein 6.1 - 8.1 g/dL 7.5 7.4 -  Total Bilirubin 0.2 - 1.2 mg/dL 0.4 0.4 -  Alkaline Phos 38 - 126 U/L - - -  AST 10 - 35 U/L 18 22 -  ALT 6 - 29 U/L 18 24 -   Lipid Panel     Component Value Date/Time   CHOL 241 (H) 03/07/2019 0618   CHOL 253 (H) 11/16/2018 0956   TRIG 119 03/07/2019 0618   HDL 52 03/07/2019 0618   HDL 55 11/16/2018 0956    CHOLHDL 4.6 03/07/2019 0618   VLDL 24 03/07/2019 0618   LDLCALC 165 (H) 03/07/2019 0618   LDLCALC 148 (H) 11/16/2018 0956    CBC    Component Value Date/Time   WBC 11.3 (H) 05/03/2019 0932   RBC 4.77 05/03/2019 0932   HGB 14.2 05/03/2019 0932   HGB 14.0 11/16/2018 0956   HCT 40.6 05/03/2019 0932   HCT 41.6 11/16/2018 0956   PLT 349 05/03/2019 0932   PLT 275 11/16/2018 0956   MCV 85.1 05/03/2019 0932   MCV 88 11/16/2018 0956   MCH 29.8 05/03/2019 0932   MCHC 35.0 05/03/2019 0932   RDW 13.1 05/03/2019 0932   RDW 12.5 11/16/2018 0956   LYMPHSABS 5,797 (H) 05/03/2019 0932   MONOABS 1.4 (H) 03/07/2019 0332   EOSABS 79 05/03/2019 0932   BASOSABS 45 05/03/2019 0932   Results for orders placed or performed in visit on 09/16/19  POCT glucose (manual entry)  Result Value Ref Range   POC Glucose 167 (A) 70 - 99 mg/dl  POCT glycosylated hemoglobin (Hb A1C)  Result Value Ref Range   Hemoglobin A1C     HbA1c POC (<> result, manual entry)     HbA1c, POC (prediabetic range)     HbA1c, POC (controlled diabetic range) 8.0 (A) 0.0 - 7.0 %    ASSESSMENT AND PLAN: 1. Type 2 diabetes mellitus without complication, without long-term current use of insulin (HCC) -Dietary counseling given.  She plans to discontinue eating unhealthy snacks and fast foods.  She also plans to work on increasing her physical activity by walking outside.  Since she cannot tolerate a higher dose of metformin, we discussed changing to a different  medication.  Patient prefers to hold off on making any changes at this time.  She will stay on the Metformin 250 mg and would like to give herself time to make dietary changes and start exercising. Advised to schedule eye appointment - POCT glucose (manual entry) - POCT glycosylated hemoglobin (Hb A1C) - Ambulatory referral to Ophthalmology  2. Pap smear for cervical cancer screening - Cytology - PAP  3. Grief reaction Patient does not feel that she needs any counseling  or medication at this time.  She feels some relief and having been able to talk with me about it today.  4. Blepharitis of right lower eyelid, unspecified type I recommend warm compresses to the eye twice a day for about 5 minutes.  We will also give a trial of some antibiotic eyedrops. - trimethoprim-polymyxin b (POLYTRIM) ophthalmic solution; Place 1 drop into the right eye every 6 (six) hours.  Dispense: 10 mL; Refill: 0 - Ambulatory referral to Ophthalmology  5. Hordeolum externum of right lower eyelid See #4 above  6. BMI 40.0-44.9, adult ([place]) See #1 above  HM: She is due for colonoscopy.  She has an appointment with gastroenterologist Dr. Paulita Fujita in 2 weeks.  She plans to discuss it then. Patient was given the opportunity to ask questions.  Patient verbalized understanding of the plan and was able to repeat key elements of the plan.   Orders Placed This Encounter  Procedures  . POCT glucose (manual entry)  . POCT glycosylated hemoglobin (Hb A1C)     Requested Prescriptions    No prescriptions requested or ordered in this encounter    No follow-ups on file.  Karle Plumber, MD, FACP

## 2019-09-16 NOTE — Patient Instructions (Signed)
I have referred you to an ophthalmologist for your diabetic eye exam.  Use warm compresses to the right eye.  I have prescribed some antibiotic eyedrops for the eye.  Continue to work on her eating habits.  Try to move more.  Goal is to get in some form of moderate intensity exercise at least 3 to 4 days a week for 30 minutes.

## 2019-09-19 ENCOUNTER — Other Ambulatory Visit: Payer: Self-pay

## 2019-09-19 ENCOUNTER — Other Ambulatory Visit (HOSPITAL_COMMUNITY)
Admission: RE | Admit: 2019-09-19 | Discharge: 2019-09-19 | Disposition: A | Discharge status: Home / Self Care | Payer: No Typology Code available for payment source | Source: Ambulatory Visit | Attending: Internal Medicine | Admitting: Internal Medicine

## 2019-09-19 DIAGNOSIS — Z124 Encounter for screening for malignant neoplasm of cervix: Secondary | ICD-10-CM | POA: Diagnosis present

## 2019-09-20 LAB — CERVICOVAGINAL ANCILLARY ONLY
Bacterial Vaginitis (gardnerella): POSITIVE — AB
Candida Glabrata: NEGATIVE
Candida Vaginitis: NEGATIVE
Comment: NEGATIVE
Comment: NEGATIVE
Comment: NEGATIVE
Comment: NEGATIVE
Trichomonas: NEGATIVE

## 2019-09-21 ENCOUNTER — Other Ambulatory Visit: Payer: Self-pay | Admitting: Internal Medicine

## 2019-09-21 MED ORDER — METRONIDAZOLE 500 MG PO TABS: 500.0000 mg | ORAL_TABLET | Freq: Two times a day (BID) | ORAL | 0 refills | Status: DC

## 2019-09-21 MED FILL — METRONIDAZOLE 500 MG TABS: 500 | 7 days supply | Qty: 14 | Fill #0

## 2019-09-22 ENCOUNTER — Other Ambulatory Visit: Payer: Self-pay

## 2019-09-22 ENCOUNTER — Ambulatory Visit
Admission: RE | Admit: 2019-09-22 | Discharge: 2019-09-22 | Disposition: A | Discharge status: Home / Self Care | Payer: No Typology Code available for payment source | Source: Ambulatory Visit | Attending: Internal Medicine | Admitting: Internal Medicine

## 2019-09-22 DIAGNOSIS — Z1231 Encounter for screening mammogram for malignant neoplasm of breast: Secondary | ICD-10-CM

## 2019-09-26 ENCOUNTER — Encounter: Payer: Self-pay | Admitting: Internal Medicine

## 2019-09-27 LAB — CYTOLOGY - PAP
Chlamydia: NEGATIVE
Comment: NEGATIVE
Comment: NEGATIVE
Comment: NEGATIVE
Comment: NEGATIVE
Comment: NORMAL
Diagnosis: UNDETERMINED — AB
HPV 16: NEGATIVE
HPV 18 / 45: NEGATIVE
High risk HPV: POSITIVE — AB
Neisseria Gonorrhea: NEGATIVE
Trichomonas: NEGATIVE

## 2019-09-28 ENCOUNTER — Other Ambulatory Visit: Payer: Self-pay | Admitting: Internal Medicine

## 2019-09-28 ENCOUNTER — Encounter: Payer: Self-pay | Admitting: Internal Medicine

## 2019-09-28 DIAGNOSIS — R87618 Other abnormal cytological findings on specimens from cervix uteri: Secondary | ICD-10-CM

## 2019-10-12 ENCOUNTER — Other Ambulatory Visit: Payer: Self-pay | Admitting: Internal Medicine

## 2019-10-12 DIAGNOSIS — I10 Essential (primary) hypertension: Secondary | ICD-10-CM

## 2019-10-26 LAB — HM DIABETES EYE EXAM

## 2019-10-27 ENCOUNTER — Encounter: Payer: Self-pay | Admitting: Internal Medicine

## 2019-10-27 DIAGNOSIS — E119 Type 2 diabetes mellitus without complications: Secondary | ICD-10-CM

## 2019-11-12 MED FILL — metFORMIN HCL 500 MG TABS: 500 | 30 days supply | Qty: 30 | Fill #1

## 2019-11-14 ENCOUNTER — Other Ambulatory Visit: Payer: Self-pay | Admitting: Internal Medicine

## 2019-11-15 MED FILL — DESCOVY 200-25 MG TABS: 200-25 | 30 days supply | Qty: 30 | Fill #6

## 2019-11-15 MED FILL — PIFELTRO 100 MG TABS: 100 | 30 days supply | Qty: 30 | Fill #6

## 2019-11-17 MED FILL — SPIRONOLACTONE 100 MG TAB: 100 | 30 days supply | Qty: 30 | Fill #1

## 2019-11-24 ENCOUNTER — Ambulatory Visit (INDEPENDENT_AMBULATORY_CARE_PROVIDER_SITE_OTHER): Payer: No Typology Code available for payment source | Admitting: Podiatry

## 2019-11-24 ENCOUNTER — Other Ambulatory Visit: Payer: Self-pay

## 2019-11-24 VITALS — BP 127/79 | HR 99

## 2019-11-24 DIAGNOSIS — B351 Tinea unguium: Secondary | ICD-10-CM

## 2019-11-24 DIAGNOSIS — E119 Type 2 diabetes mellitus without complications: Secondary | ICD-10-CM | POA: Diagnosis not present

## 2019-11-24 MED ORDER — CICLOPIROX 8 % EX SOLN: Freq: Every day | CUTANEOUS | 4 refills | Status: DC

## 2019-11-24 MED FILL — CICLOPIROX 8% SOLUTION: 8 | 30 days supply | Qty: 7 | Fill #0

## 2019-11-24 NOTE — Patient Instructions (Signed)
Diabetes Mellitus and Foot Care Foot care is an important part of your health, especially when you have diabetes. Diabetes may cause you to have problems because of poor blood flow (circulation) to your feet and legs, which can cause your skin to:  Become thinner and drier.  Break more easily.  Heal more slowly.  Peel and crack. You may also have nerve damage (neuropathy) in your legs and feet, causing decreased feeling in them. This means that you may not notice minor injuries to your feet that could lead to more serious problems. Noticing and addressing any potential problems early is the best way to prevent future foot problems. How to care for your feet Foot hygiene  Wash your feet daily with warm water and mild soap. Do not use hot water. Then, pat your feet and the areas between your toes until they are completely dry. Do not soak your feet as this can dry your skin.  Trim your toenails straight across. Do not dig under them or around the cuticle. File the edges of your nails with an emery board or nail file.  Apply a moisturizing lotion or petroleum jelly to the skin on your feet and to dry, brittle toenails. Use lotion that does not contain alcohol and is unscented. Do not apply lotion between your toes. Shoes and socks  Wear clean socks or stockings every day. Make sure they are not too tight. Do not wear knee-high stockings since they may decrease blood flow to your legs.  Wear shoes that fit properly and have enough cushioning. Always look in your shoes before you put them on to be sure there are no objects inside.  To break in new shoes, wear them for just a few hours a day. This prevents injuries on your feet. Wounds, scrapes, corns, and calluses  Check your feet daily for blisters, cuts, bruises, sores, and redness. If you cannot see the bottom of your feet, use a mirror or ask someone for help.  Do not cut corns or calluses or try to remove them with medicine.  If you  find a minor scrape, cut, or break in the skin on your feet, keep it and the skin around it clean and dry. You may clean these areas with mild soap and water. Do not clean the area with peroxide, alcohol, or iodine.  If you have a wound, scrape, corn, or callus on your foot, look at it several times a day to make sure it is healing and not infected. Check for: ? Redness, swelling, or pain. ? Fluid or blood. ? Warmth. ? Pus or a bad smell. General instructions  Do not cross your legs. This may decrease blood flow to your feet.  Do not use heating pads or hot water bottles on your feet. They may burn your skin. If you have lost feeling in your feet or legs, you may not know this is happening until it is too late.  Protect your feet from hot and cold by wearing shoes, such as at the beach or on hot pavement.  Schedule a complete foot exam at least once a year (annually) or more often if you have foot problems. If you have foot problems, report any cuts, sores, or bruises to your health care provider immediately. Contact a health care provider if:  You have a medical condition that increases your risk of infection and you have any cuts, sores, or bruises on your feet.  You have an injury that is not   healing.  You have redness on your legs or feet.  You feel burning or tingling in your legs or feet.  You have pain or cramps in your legs and feet.  Your legs or feet are numb.  Your feet always feel cold.  You have pain around a toenail. Get help right away if:  You have a wound, scrape, corn, or callus on your foot and: ? You have pain, swelling, or redness that gets worse. ? You have fluid or blood coming from the wound, scrape, corn, or callus. ? Your wound, scrape, corn, or callus feels warm to the touch. ? You have pus or a bad smell coming from the wound, scrape, corn, or callus. ? You have a fever. ? You have a red line going up your leg. Summary  Check your feet every day  for cuts, sores, red spots, swelling, and blisters.  Moisturize feet and legs daily.  Wear shoes that fit properly and have enough cushioning.  If you have foot problems, report any cuts, sores, or bruises to your health care provider immediately.  Schedule a complete foot exam at least once a year (annually) or more often if you have foot problems. This information is not intended to replace advice given to you by your health care provider. Make sure you discuss any questions you have with your health care provider. Document Revised: 07/27/2019 Document Reviewed: 12/05/2016 Elsevier Patient Education  2020 Elsevier Inc.  

## 2019-11-24 NOTE — Progress Notes (Signed)
Subjective:   Patient ID: Wanda Kane, female   DOB: 59 y.o.   MRN: [id number]   HPI 59 year old female with history of DVT, type 2 diabetes, HIV presents to the office today for diabetic foot exam.  She does state that she started developed nail fungus.  She tried over-the-counter medication without any improvement.  She then was on oral Lamisil but she subsequently developed pancreatitis and we were not sure if this came from her other medications versus Lamisil.  Last A1c 8 on 09/16/2019 Last AM glucose   No history of foot ulcer  No numbness or tingling.  Review of Systems  All other systems reviewed and are negative.       Objective:  Physical Exam  General: AAO x3, NAD  Dermatological: Nails are mildly hypertrophic, dystrophic with yellow-brown discoloration.  There is no pain in the nails there is no redness or drainage or any signs of infection.  There is no open lesions.  Vascular: Dorsalis Pedis artery and Posterior Tibial artery pedal pulses are 2/4 bilateral with immedate capillary fill time. Pedal hair growth present.  There is no pain with calf compression, swelling, warmth, erythema.   Neruologic: Grossly intact via light touch bilateral.  Sensation intact with Semmes Weinstein monofilament.  Musculoskeletal: No gross boney pedal deformities bilateral. No pain, crepitus, or limitation noted with foot and ankle range of motion bilateral. Muscular strength 5/5 in all groups tested bilateral.  Gait: Unassisted, Nonantalgic.       Assessment:   Uncontrolled type 2 diabetes without complication, onychomycosis     Plan:  -Treatment options discussed including all alternatives, risks, and complications -Etiology of symptoms were discussed -We discussed the importance of Kane foot inspection. -As a courtesy of the nails x10 without any complication.  We discussed treatment options for nail fungus.  Prescribed Penlac.  Also discussed external measures to  help prevent spread of fungus.  Return in about 6 months (around 05/23/2020).  Trula Slade DPM

## 2019-12-13 MED FILL — DESCOVY 200-25 MG TABS: 200-25 | 30 days supply | Qty: 30 | Fill #7

## 2019-12-15 ENCOUNTER — Telehealth: Payer: Self-pay | Admitting: Pharmacy Technician

## 2019-12-15 MED FILL — PIFELTRO 100 MG TABS: 100 | 30 days supply | Qty: 30 | Fill #7

## 2019-12-15 MED FILL — SPIRONOLACTONE 100 MG TAB: 100 | 30 days supply | Qty: 30 | Fill #2

## 2019-12-15 NOTE — Telephone Encounter (Signed)
[facility] Patient Advocate Encounter   Was successful in obtaining a [employer] copay card for [employer].  This copay card will make the patients copay 0.  The billing information is as follows and has been shared with [place].  RxBin: [id number] PCN: [id number] Member ID: [id number] Group ID: [id number]  Wanda Kane. [name] [place] Patient [facility] for Infectious Disease Phone: [phone] Fax:  [phone]

## 2019-12-19 ENCOUNTER — Ambulatory Visit: Payer: No Typology Code available for payment source | Admitting: Internal Medicine

## 2020-01-03 ENCOUNTER — Other Ambulatory Visit: Payer: Self-pay | Admitting: Internal Medicine

## 2020-01-03 DIAGNOSIS — I10 Essential (primary) hypertension: Secondary | ICD-10-CM

## 2020-01-09 MED FILL — DESCOVY 200-25 MG TABS: 200-25 | 30 days supply | Qty: 30 | Fill #8

## 2020-01-09 MED FILL — PIFELTRO 100 MG TABS: 100 | 30 days supply | Qty: 30 | Fill #8

## 2020-01-13 NOTE — Addendum Note (Signed)
Addended by: Dolan Amen D on: 01/13/2020 11:21 AM   Modules accepted: Orders

## 2020-01-16 ENCOUNTER — Ambulatory Visit: Payer: No Typology Code available for payment source | Admitting: Internal Medicine

## 2020-01-18 ENCOUNTER — Other Ambulatory Visit: Payer: No Typology Code available for payment source

## 2020-01-18 ENCOUNTER — Other Ambulatory Visit: Payer: Self-pay

## 2020-01-18 DIAGNOSIS — E1165 Type 2 diabetes mellitus with hyperglycemia: Secondary | ICD-10-CM

## 2020-01-18 DIAGNOSIS — K8500 Idiopathic acute pancreatitis without necrosis or infection: Secondary | ICD-10-CM

## 2020-01-18 DIAGNOSIS — I10 Essential (primary) hypertension: Secondary | ICD-10-CM

## 2020-01-18 DIAGNOSIS — E662 Morbid (severe) obesity with alveolar hypoventilation: Secondary | ICD-10-CM

## 2020-01-18 DIAGNOSIS — B20 Human immunodeficiency virus [HIV] disease: Secondary | ICD-10-CM

## 2020-01-19 LAB — T-HELPER CELL (CD4) - (RCID CLINIC ONLY)
CD4 % Helper T Cell: 20 % — ABNORMAL LOW (ref 33–65)
CD4 T Cell Abs: 1088 /uL (ref 400–1790)

## 2020-01-20 LAB — COMPLETE METABOLIC PANEL WITH GFR
AG Ratio: 1.5 (calc) (ref 1.0–2.5)
ALT: 23 U/L (ref 6–29)
AST: 25 U/L (ref 10–35)
Albumin: 4.4 g/dL (ref 3.6–5.1)
Alkaline phosphatase (APISO): 82 U/L (ref 37–153)
BUN: 15 mg/dL (ref 7–25)
CO2: 23 mmol/L (ref 20–32)
Calcium: 9.8 mg/dL (ref 8.6–10.4)
Chloride: 103 mmol/L (ref 98–110)
Creat: 0.76 mg/dL (ref 0.50–1.05)
GFR, Est African American: 100 mL/min/{1.73_m2} (ref >=60)
GFR, Est Non African American: 86 mL/min/{1.73_m2} (ref >=60)
Globulin: 2.9 g/dL (calc) (ref 1.9–3.7)
Glucose, Bld: 127 mg/dL — ABNORMAL HIGH (ref 65–99)
Potassium: 4.2 mmol/L (ref 3.5–5.3)
Sodium: 137 mmol/L (ref 135–146)
Total Bilirubin: 0.6 mg/dL (ref 0.2–1.2)
Total Protein: 7.3 g/dL (ref 6.1–8.1)

## 2020-01-20 LAB — CBC WITH DIFFERENTIAL/PLATELET
Absolute Monocytes: 498 cells/uL (ref 200–950)
Basophils Absolute: 28 cells/uL (ref 0–200)
Basophils Relative: 0.3 %
Eosinophils Absolute: 94 cells/uL (ref 15–500)
Eosinophils Relative: 1 %
HCT: 41.2 % (ref 35.0–45.0)
Hemoglobin: 14.3 g/dL (ref 11.7–15.5)
Lymphs Abs: 5678 cells/uL — ABNORMAL HIGH (ref 850–3900)
MCH: 31.3 pg (ref 27.0–33.0)
MCHC: 34.7 g/dL (ref 32.0–36.0)
MCV: 90.2 fL (ref 80.0–100.0)
MPV: 11.9 fL (ref 7.5–12.5)
Monocytes Relative: 5.3 %
Neutro Abs: 3102 cells/uL (ref 1500–7800)
Neutrophils Relative %: 33 %
Platelets: 290 10*3/uL (ref 140–400)
RBC: 4.57 10*6/uL (ref 3.80–5.10)
RDW: 12.3 % (ref 11.0–15.0)
Total Lymphocyte: 60.4 %
WBC: 9.4 10*3/uL (ref 3.8–10.8)

## 2020-01-20 LAB — RPR: RPR Ser Ql: NONREACTIVE

## 2020-01-20 LAB — LIPID PANEL
Cholesterol: 241 mg/dL — ABNORMAL HIGH (ref <200)
HDL: 53 mg/dL (ref >=50)
LDL Cholesterol (Calc): 157 mg/dL (calc) — ABNORMAL HIGH
Non-HDL Cholesterol (Calc): 188 mg/dL (calc) — ABNORMAL HIGH (ref <130)
Total CHOL/HDL Ratio: 4.5 (calc) (ref <5.0)
Triglycerides: 172 mg/dL — ABNORMAL HIGH (ref <150)

## 2020-01-20 LAB — HIV-1 RNA QUANT-NO REFLEX-BLD
HIV 1 RNA Quant: <20 copies/mL — AB
HIV-1 RNA Quant, Log: <1.3 Log copies/mL — AB

## 2020-01-23 ENCOUNTER — Other Ambulatory Visit: Payer: Self-pay | Admitting: Internal Medicine

## 2020-01-23 DIAGNOSIS — I10 Essential (primary) hypertension: Secondary | ICD-10-CM

## 2020-01-23 MED FILL — SPIRONOLACTONE 100 MG TAB: 100 | 30 days supply | Qty: 30 | Fill #0

## 2020-01-24 MED FILL — AMLODIPINE BESYLATE 10 MG T: 10 | 30 days supply | Qty: 30 | Fill #0

## 2020-01-24 MED FILL — LISINOPRIL 10 MG TABS: 10 | 30 days supply | Qty: 30 | Fill #0

## 2020-01-25 ENCOUNTER — Other Ambulatory Visit: Payer: No Typology Code available for payment source

## 2020-01-27 ENCOUNTER — Encounter: Payer: Self-pay | Admitting: Internal Medicine

## 2020-01-30 ENCOUNTER — Other Ambulatory Visit: Payer: Self-pay

## 2020-01-30 ENCOUNTER — Ambulatory Visit: Payer: No Typology Code available for payment source | Attending: Internal Medicine | Admitting: Internal Medicine

## 2020-01-30 DIAGNOSIS — H00014 Hordeolum externum left upper eyelid: Secondary | ICD-10-CM

## 2020-01-30 DIAGNOSIS — I10 Essential (primary) hypertension: Secondary | ICD-10-CM

## 2020-01-30 DIAGNOSIS — E119 Type 2 diabetes mellitus without complications: Secondary | ICD-10-CM

## 2020-01-30 DIAGNOSIS — B20 Human immunodeficiency virus [HIV] disease: Secondary | ICD-10-CM

## 2020-01-30 DIAGNOSIS — E669 Obesity, unspecified: Secondary | ICD-10-CM | POA: Diagnosis not present

## 2020-01-30 MED ORDER — LISINOPRIL 10 MG PO TABS: 10.0000 mg | ORAL_TABLET | Freq: Every day | ORAL | 1 refills | Status: DC

## 2020-01-30 MED ORDER — AMLODIPINE BESYLATE 10 MG PO TABS: 10.0000 mg | ORAL_TABLET | Freq: Every day | ORAL | 1 refills | Status: DC

## 2020-01-30 MED ORDER — SPIRONOLACTONE 100 MG PO TABS: 100.0000 mg | ORAL_TABLET | Freq: Every day | ORAL | 1 refills | Status: DC

## 2020-01-30 NOTE — Progress Notes (Signed)
virtual Visit via Telephone Note Due to current restrictions/limitations of in-office visits due to the COVID-19 pandemic, this scheduled clinical appointment was converted to a telehealth visit  I connected with Wanda Kane on 01/30/20 at 4:52 p.m by telephone and verified that I am speaking with the correct person using two identifiers. I am in my office.  The patient is at home.  Only the patient and myself participated in this encounter.  I discussed the limitations, risks, security and privacy concerns of performing an evaluation and management service by telephone and the availability of in person appointments. I also discussed with the patient that there may be a patient responsible charge related to this service. The patient expressed understanding and agreed to proceed.   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  Overall, patient reports that she has been doing well since last visit with me.  She has decided not to pursue further studies to become an LPN but will now pursue studies and social work.    She reports having had a bad experience with getting her eye exam done in December by Dr. Lillia Corporal.  Reports being charged twice for the same service.  She was dx with mild glaucoma or glaucoma suspect which she states was not discussed with her.  She reported him to consume a group. Tried to get records from him and to take to her previous eye doctor at [facility].  This also turned into an unpleasant ordeal. She has an appointment coming up with [facility].  In the interim she has also developed a stye on her left upper eyelid which has been present for the past 1 month.  She has been trying warm compresses but this has not helped.  DM: Reports she is doing much better with her eating habits.  She now  does intermittent fasting.  She is eating less junk food and she has cut back on eating bread and other white stuff.  Eating more vegetables.   Walking 3 x a wk 30-45 mins She feels she has lost weight because she is now down 1 clothes size.    HTN: checking BP intermittently and reports that blood pressures have been good.  Most recent blood pressure was 117/75.  She limits salt in her foods.   good  HL: She had recent blood test done by her infectious disease specialist.  Her triglyceride, total and LDL cholesterol were not at goal.  She is not interested in being placed on statin therapy.  I went over with her the ASCVD risk score explaining to her the significance of it. The 10-year ASCVD risk score Mikey Bussing DC Brooke Bonito., et al., 2013) is: 17.2%   Values used to calculate the score:     Age: 60 years     Sex: Female     Is Non-Hispanic African American: Yes     Diabetic: Yes     Tobacco smoker: No     Systolic Blood Pressure: 026 mmHg     Is BP treated: Yes     HDL Cholesterol: 53 mg/dL     Total Cholesterol: 241 mg/dL  History of HIV: She is followed by ID.  She reports compliance with taking her to antiretroviral medications.  Recent RNA quant was less than 20.  She has an appointment coming up with her infectious  disease specialist. Outpatient Encounter Medications as of 01/30/2020  Medication Sig  . acetaminophen (TYLENOL) 500 MG tablet Take 1,000 mg by mouth every 6 (six) hours as needed for mild pain, moderate pain or headache.  Marland Kitchen amLODipine (NORVASC) 10 MG tablet TAKE 1 TABLET (10 MG TOTAL) BY MOUTH Kane.  Marland Kitchen B-Complex-C TABS Take 1 tablet by mouth Kane.  . Blood Glucose Monitoring Suppl (TRUE METRIX METER) w/Device KIT Check sugar once a day  . ciclopirox (PENLAC) 8 % solution Apply topically at bedtime. Apply over nail and surrounding skin. Apply Kane over previous coat. After seven (7) days, may remove with alcohol and continue cycle.  . doravirine (PIFELTRO) 100 MG TABS tablet Take  1 tablet (100 mg total) by mouth Kane.  Marland Kitchen emtricitabine-tenofovir AF (DESCOVY) 200-25 MG tablet Take 1 tablet by mouth Kane.  Marland Kitchen glucose blood (TRUE METRIX BLOOD GLUCOSE TEST) test strip Check sugar once a day  . lisinopril (ZESTRIL) 10 MG tablet TAKE 1 TABLET (10 MG TOTAL) BY MOUTH Kane.  . metFORMIN (GLUCOPHAGE) 500 MG tablet TAKE 1 TABLET BY MOUTH Kane WITH BREAKFAST  . metroNIDAZOLE (FLAGYL) 500 MG tablet Take 1 tablet (500 mg total) by mouth 2 (two) times Kane.  . Multiple Vitamin (MULTIVITAMIN) capsule Take 1 capsule by mouth Kane.  . NON FORMULARY Vitamin D, pumpkin seed oil - use as directed  . spironolactone (ALDACTONE) 100 MG tablet TAKE 1 TABLET (100 MG TOTAL) BY MOUTH Kane.  Marland Kitchen trimethoprim-polymyxin b (POLYTRIM) ophthalmic solution Place 1 drop into the right eye every 6 (six) hours.  . TRUEPLUS LANCETS 28G MISC Check sugar once a day   No facility-administered encounter medications on file as of 01/30/2020.      Observations/Objective: Results for orders placed or performed in visit on 01/18/20  Lipid panel  Result Value Ref Range   Cholesterol 241 (H) <200 mg/dL   HDL 53 > OR = 50 mg/dL   Triglycerides 172 (H) <150 mg/dL   LDL Cholesterol (Calc) 157 (H) mg/dL (calc)   Total CHOL/HDL Ratio 4.5 <5.0 (calc)   Non-HDL Cholesterol (Calc) 188 (H) <130 mg/dL (calc)  RPR  Result Value Ref Range   RPR Ser Ql NON-REACTIVE NON-REACTI  COMPLETE METABOLIC PANEL WITH GFR  Result Value Ref Range   Glucose, Bld 127 (H) 65 - 99 mg/dL   BUN 15 7 - 25 mg/dL   Creat 0.76 0.50 - 1.05 mg/dL   GFR, Est Non African American 86 > OR = 60 mL/min/1.25m   GFR, Est African American 100 > OR = 60 mL/min/1.750m  BUN/Creatinine Ratio NOT APPLICABLE 6 - 22 (calc)   Sodium 137 135 - 146 mmol/L   Potassium 4.2 3.5 - 5.3 mmol/L   Chloride 103 98 - 110 mmol/L   CO2 23 20 - 32 mmol/L   Calcium 9.8 8.6 - 10.4 mg/dL   Total Protein 7.3 6.1 - 8.1 g/dL   Albumin 4.4 3.6 - 5.1 g/dL   Globulin  2.9 1.9 - 3.7 g/dL (calc)   AG Ratio 1.5 1.0 - 2.5 (calc)   Total Bilirubin 0.6 0.2 - 1.2 mg/dL   Alkaline phosphatase (APISO) 82 37 - 153 U/L   AST 25 10 - 35 U/L   ALT 23 6 - 29 U/L  CBC with Differential/Platelet  Result Value Ref Range   WBC 9.4 3.8 - 10.8 Thousand/uL   RBC 4.57 3.80 - 5.10 Million/uL   Hemoglobin 14.3 11.7 - 15.5 g/dL   HCT 41.2 35.0 -  45.0 %   MCV 90.2 80.0 - 100.0 fL   MCH 31.3 27.0 - 33.0 pg   MCHC 34.7 32.0 - 36.0 g/dL   RDW 12.3 11.0 - 15.0 %   Platelets 290 140 - 400 Thousand/uL   MPV 11.9 7.5 - 12.5 fL   Neutro Abs 3,102 1,500 - 7,800 cells/uL   Lymphs Abs 5,678 (H) 850 - 3,900 cells/uL   Absolute Monocytes 498 200 - 950 cells/uL   Eosinophils Absolute 94 15 - 500 cells/uL   Basophils Absolute 28 0 - 200 cells/uL   Neutrophils Relative % 33 %   Total Lymphocyte 60.4 %   Monocytes Relative 5.3 %   Eosinophils Relative 1.0 %   Basophils Relative 0.3 %  T-helper cell (CD4)- ([facility] clinic only)  Result Value Ref Range   CD4 T Cell Abs 1,088 400 - 1,790 /uL   CD4 % Helper T Cell 20 (L) 33 - 65 %  HIV-1 RNA quant-no reflex-bld  Result Value Ref Range   HIV 1 RNA Quant <20 DETECTED (A) NOT DETECT copies/mL   HIV-1 RNA Quant, Log <1.30 DETECTED (A) NOT DETECT Log copies/mL     Assessment and Plan: 1. Type 2 diabetes mellitus without complication, without long-term current use of insulin ([place]) -commended her on changing eating habits and regular exercise. Encouraged her too keep up the good works ContinueMetformin - Hemoglobin A1c; Future  2. Essential hypertension Controlled.  Continue current meds - amLODipine (NORVASC) 10 MG tablet; Take 1 tablet (10 mg total) by mouth Kane.  Dispense: 90 tablet; Refill: 1 - lisinopril (ZESTRIL) 10 MG tablet; Take 1 tablet (10 mg total) by mouth Kane.  Dispense: 90 tablet; Refill: 1 - spironolactone (ALDACTONE) 100 MG tablet; Take 1 tablet (100 mg total) by mouth Kane.  Dispense: 90 tablet; Refill: 1  3.  Obesity (BMI 30-39.9) See #1 above  4. Hordeolum externum of left upper eyelid She will keep up coming appt   5.  HIV Followed by ID Follow Up Instructions:    I discussed the assessment and treatment plan with the patient. The patient was provided an opportunity to ask questions and all were answered. The patient agreed with the plan and demonstrated an understanding of the instructions.   The patient was advised to call back or seek an in-person evaluation if the symptoms worsen or if the condition fails to improve as anticipated.  I provided 29 minutes of non-face-to-face time during this encounter.   Karle Plumber, MD

## 2020-01-30 NOTE — Progress Notes (Signed)
Pt states her blood sugars has been running between 120-130

## 2020-01-31 MED FILL — AMOX-CLAV 875-125 MG TABLET: 875-125 | 10 days supply | Qty: 20 | Fill #0

## 2020-02-01 ENCOUNTER — Ambulatory Visit: Payer: No Typology Code available for payment source | Admitting: Infectious Disease

## 2020-02-09 MED FILL — PIFELTRO 100 MG TABS: 100 | 30 days supply | Qty: 30 | Fill #9

## 2020-02-09 MED FILL — DESCOVY 200-25 MG TABS: 200-25 | 30 days supply | Qty: 30 | Fill #9

## 2020-02-10 MED FILL — CEPHALEXIN 500 MG CAPSULE: 500 | 10 days supply | Qty: 20 | Fill #0

## 2020-02-15 ENCOUNTER — Other Ambulatory Visit: Payer: Self-pay

## 2020-02-15 ENCOUNTER — Encounter: Payer: Self-pay | Admitting: Infectious Disease

## 2020-02-15 ENCOUNTER — Other Ambulatory Visit: Payer: Self-pay | Admitting: Infectious Disease

## 2020-02-15 ENCOUNTER — Ambulatory Visit (INDEPENDENT_AMBULATORY_CARE_PROVIDER_SITE_OTHER): Payer: No Typology Code available for payment source | Admitting: Infectious Disease

## 2020-02-15 VITALS — Ht 64.0 in | Wt 223.0 lb

## 2020-02-15 DIAGNOSIS — E1165 Type 2 diabetes mellitus with hyperglycemia: Secondary | ICD-10-CM

## 2020-02-15 DIAGNOSIS — I10 Essential (primary) hypertension: Secondary | ICD-10-CM

## 2020-02-15 DIAGNOSIS — B20 Human immunodeficiency virus [HIV] disease: Secondary | ICD-10-CM | POA: Diagnosis not present

## 2020-02-15 DIAGNOSIS — K8500 Idiopathic acute pancreatitis without necrosis or infection: Secondary | ICD-10-CM | POA: Diagnosis not present

## 2020-02-15 DIAGNOSIS — Z6839 Body mass index (BMI) 39.0-39.9, adult: Secondary | ICD-10-CM

## 2020-02-15 DIAGNOSIS — E662 Morbid (severe) obesity with alveolar hypoventilation: Secondary | ICD-10-CM | POA: Diagnosis not present

## 2020-02-15 MED ORDER — DORAVIRINE 100 MG PO TABS: 100.0000 mg | ORAL_TABLET | Freq: Every day | ORAL | 11 refills | Status: DC

## 2020-02-15 MED ORDER — EMTRICITABINE-TENOFOVIR AF 200-25 MG PO TABS: 1.0000 | ORAL_TABLET | Freq: Every day | ORAL | 11 refills | Status: DC

## 2020-02-15 NOTE — Progress Notes (Signed)
Virtual Visit via Telephone Note  I connected with Wanda Kane on 02/15/20 at 10:00 AM EDT by telephone and verified that I am speaking with the correct person using two identifiers.  Location: Patient: Home Provider: [facility]   I discussed the limitations, risks, security and privacy concerns of performing an evaluation and management service by telephone and the availability of in person appointments. I also discussed with the patient that there may be a patient responsible charge related to this service. The patient expressed understanding and agreed to proceed.   History of Present Illness: Ms Wanda Kane is a 59year-old African American lady with HIV previous he cared for [facility] and then moved to [place]. She had been on various regimens in 1990s and then on drug holiday. Later while in [place] she apparently was on various antiretroviral regimens lastly on [place]ent[place] and complera.  She moved back was seen by Dr. Baxter Flattery and regimen was simplified to [facility]. She had tolerated this regimen without to much complaint other some nausea and loose stools.   However week prior to her seeing me in March 2014 she had developed increasingly severe diffuse myalgias in her neck arms legs. She had similar symptoms in the past while out on antiretrovirals. She could not initially recall which antiretrovirals at apparently been blamed for the symptoms. However when I examined her allergy history he or she is listed as being allergic to tenofovir with alleged  tenofovir-induced rhabdomyolysis.  I therefore crafted a new ARV regimen for her without TNF, namely once Kane Tivicay, Edurant and Epivir. I checked her for elevated CPK Which was at 237. Since change to new TNF free regimen she had down titration of her CK to normal but then back above 200 again.   She had initially not been seen by Rheum due to lack of insurance.  I offered further Rheum workup with labs was  unrevealing  We discussed option of going to 2 drug regimen of JULUCA (DTG/RPV)She then madethe switch to JULUCA but had been continuing her Lamivudine as well which is not what I was trying to trial with her, rather I was moving to treat her based on what JULUCA is FDA approved for with 2 drug regimen for stably suppressed pts.   She was definitely switched to [place] alone and shewasvery happy with this regimen.   She has been perfectly suppressed on this regimen. I talked to her via an ED visit of the 16th 2020. Apparently the next day but developed back pain with radiation into her abdomen. She was wondering if this was due to her having a poor mattress and continue to have this pain. This progressed into the weekend and then after eating takeout food on Sunday night suddenly developed severe abdominal pain with nausea and foamy emesis. Her cramping abdominal pain and vomiting and persisted and she came to the emergency department at [facility] she was diagnosed with pancreatitis with labs showing a lipase of thousand 812.  CT scan of the abdomen pelvis which showed acute inflammation of the pancreas and some inflammation of the adjacent duodenum. There is not any mention of gallstones on the study.  The ER physician wanted to admit her but [name] had a nursing exam that she wanted to attend to that was that same morning on Monday.  ER physician speculated that this could be a drug-induced pancreatitis though drug-induced pancreatitis from her antiretrovirals is very rare as it is some of  her other medications. She did start Lamisil in the last several months the other new thing is she had tried to do a low-carb diet over the last 3 months and more recently had been eating more carbohydrates.  She does as mentioned above have a history of myositis that was diagnosed in [place] and blamed on tenofovir but clearly was not due to tenofovir because she  was on cidofovir for years.  She might have some type of autoimmune pathology  In any case while she was at home she did not improve and was not able to manage her pain with Vicodin nor her nausea with Phenergan.  I checked on her via telephone twice. I felt strongly she should come to the hospital for admission.  I arranged for her to be admitted to the hospitalist service and she was evaluated by GI.  It was felt that her pancreatitis was either idiopathic or drug-induced.  She did reinitiate JULUCA as an inpatient but then stopped it when she went back home.  She was eating actual meals yet again which would be compatible with taking JULUCA.  We had an extensive discussion with regards to whether we should change her regimen entirely versus continue the rechallenged with JULUCA since it seemed fairly unlikely to me that either of these drugs was likely actually causing her pancreatitis.  After extensive discussion just she decided to continue back on [place] with possibility of revisiting a different agent down the road if she has recurrence of her pancreatitis I did want to ensure that she is eating food and this is not exacerbating her pancreatitis as we could get a false impression of what was triggering her pancreatitis she is resuming a diet to rapidly.  In the interim after restarting JULUCA she again experienced increased abdominal pain and her lipase was again increased.  She stopped the [place].  We made a decision to change over to [facility] which she has been on since then.  Initially she was having quite a bit of trouble with nausea vomiting and loose stools.  She calledwith concerns we discussed changing to a different regimen potentially.  Over the weekend she found that by increasing the amount of calories that she eats when she takes a Symtuza she was able to greatly improve the nausea and is now able to tolerate it.  She still has some loose stools but the nausea is  dramatically improved.  We had discussions about potential causes of pancreatitis including whether this was due to the integrates strand transfer inhibitor whether is due to the non-nucleotide reverse transcript of an ACE inhibitor and whether it was worth trialing 1 of these classes again or not.  She was  tolerating Symtuza better than before with less nausea and diarrhea but she continues to have bloating.  She told me she wished to go back onto the [place].  I proposed alternative of placing her on Doravarine (Pifeltro) and Descovy, though the NNRTI could potentially also potentially cause pancreatitis and really this was due to repetitive rain.  My suspicion is that the JULUCA had nothing to do with her pancreatitis but when she rechallenge herself she did have symptoms.  She very much likes her new regimen and is continue to maintain virologically suppressed.  She follows up with her primary care physician Dr. Wynetta Emery for primary care.  We again had discussion about COVID-19 vaccination I strongly recommended her getting vaccinated with one of the mRNA vaccines if possible.   Observations/Objective:  HIV disease: Perfectly controlled and she is highly adherent to her antivirals.  She can come back in 1 years time.  Assessment and Plan:  HIV disease continue with Pifeltro and Descovy and we will see her back in 1 years time.  Hypertension continue antihypertensives  Diabetes mellitus continue metformin and follow-up with primary care physician  Obesity carbohydrate restriction has worked for her well in the past.        Follow Up Instructions:    I discussed the assessment and treatment plan with the patient. The patient was provided an opportunity to ask questions and all were answered. The patient agreed with the plan and demonstrated an understanding of the instructions.   The patient was advised to call back or seek an in-person evaluation if the symptoms worsen or if  the condition fails to improve as anticipated.  I provided 15 minutes of non-face-to-face time during this encounter.   Alcide Evener, MD

## 2020-02-17 MED FILL — LISINOPRIL 10 MG TABS: 10 | 90 days supply | Qty: 90 | Fill #0

## 2020-02-17 MED FILL — SPIRONOLACTONE 100 MG TAB: 100 | 90 days supply | Qty: 90 | Fill #0

## 2020-02-17 MED FILL — AMLODIPINE BESYLATE 10 MG T: 10 | 90 days supply | Qty: 90 | Fill #0

## 2020-03-07 MED FILL — PIFELTRO 100 MG TABS: 100 | 30 days supply | Qty: 30 | Fill #10

## 2020-03-07 MED FILL — DESCOVY 200-25 MG TABS: 200-25 | 30 days supply | Qty: 30 | Fill #10

## 2020-04-02 MED FILL — PIFELTRO 100 MG TABS: 100 | 30 days supply | Qty: 30 | Fill #11

## 2020-04-02 MED FILL — DESCOVY 200-25 MG TABS: 200-25 | 30 days supply | Qty: 30 | Fill #11

## 2020-04-07 MED FILL — CICLOPIROX 8% SOLUTION: 8 | 30 days supply | Qty: 7 | Fill #1

## 2020-04-14 ENCOUNTER — Ambulatory Visit: Payer: No Typology Code available for payment source

## 2020-04-27 ENCOUNTER — Other Ambulatory Visit: Payer: Self-pay | Admitting: Infectious Disease

## 2020-04-30 MED FILL — PIFELTRO 100 MG TABS: 100 | 30 days supply | Qty: 30 | Fill #0

## 2020-04-30 MED FILL — DESCOVY 200-25 MG TABS: 200-25 | 30 days supply | Qty: 30 | Fill #0

## 2020-05-09 ENCOUNTER — Other Ambulatory Visit: Payer: No Typology Code available for payment source

## 2020-05-10 ENCOUNTER — Ambulatory Visit: Payer: No Typology Code available for payment source | Admitting: Internal Medicine

## 2020-05-24 ENCOUNTER — Ambulatory Visit: Payer: No Typology Code available for payment source | Admitting: Podiatry

## 2020-05-24 MED FILL — LISINOPRIL 10 MG TABS: 10 | 90 days supply | Qty: 90 | Fill #1

## 2020-05-24 MED FILL — AMLODIPINE BESYLATE 10 MG T: 10 | 90 days supply | Qty: 90 | Fill #1

## 2020-05-24 MED FILL — SPIRONOLACTONE 100 MG TAB: 100 | 90 days supply | Qty: 90 | Fill #1

## 2020-05-26 MED FILL — PIFELTRO 100 MG TABS: 100 | 30 days supply | Qty: 30 | Fill #1

## 2020-05-26 MED FILL — DESCOVY 200-25 MG TABS: 200-25 | 30 days supply | Qty: 30 | Fill #1

## 2020-06-12 ENCOUNTER — Ambulatory Visit: Payer: No Typology Code available for payment source | Admitting: Internal Medicine

## 2020-06-25 MED FILL — PIFELTRO 100 MG TABS: 100 | 30 days supply | Qty: 30 | Fill #2

## 2020-06-25 MED FILL — DESCOVY 200-25 MG TABS: 200-25 | 30 days supply | Qty: 30 | Fill #2

## 2020-06-26 ENCOUNTER — Ambulatory Visit: Payer: No Typology Code available for payment source | Admitting: Internal Medicine

## 2020-06-28 ENCOUNTER — Ambulatory Visit: Payer: No Typology Code available for payment source | Admitting: Podiatry

## 2020-07-17 ENCOUNTER — Ambulatory Visit: Payer: No Typology Code available for payment source | Admitting: Internal Medicine

## 2020-07-24 MED FILL — PIFELTRO 100 MG TABS: 100 | 30 days supply | Qty: 30 | Fill #3

## 2020-07-24 MED FILL — DESCOVY 200-25 MG TABS: 200-25 | 30 days supply | Qty: 30 | Fill #3

## 2020-07-30 ENCOUNTER — Ambulatory Visit: Payer: No Typology Code available for payment source | Admitting: Podiatry

## 2020-08-21 MED FILL — PIFELTRO 100 MG TABS: 100 | 30 days supply | Qty: 30 | Fill #4

## 2020-08-21 MED FILL — DESCOVY 200-25 MG TABS: 200-25 | 30 days supply | Qty: 30 | Fill #4

## 2020-08-28 ENCOUNTER — Other Ambulatory Visit: Payer: Self-pay | Admitting: Internal Medicine

## 2020-08-28 DIAGNOSIS — I10 Essential (primary) hypertension: Secondary | ICD-10-CM

## 2020-08-28 MED FILL — SPIRONOLACTONE 100 MG TAB: 100 | 30 days supply | Qty: 30 | Fill #0

## 2020-08-28 MED FILL — LISINOPRIL 10 MG TABS: 10 | 30 days supply | Qty: 30 | Fill #0

## 2020-08-28 MED FILL — AMLODIPINE BESYLATE 10 MG T: 10 | 30 days supply | Qty: 30 | Fill #0

## 2020-08-28 NOTE — Telephone Encounter (Signed)
Called pt and LM on VM to call office to make appt- 30 day courtesy RF given.

## 2020-09-06 ENCOUNTER — Ambulatory Visit: Payer: No Typology Code available for payment source | Admitting: Internal Medicine

## 2020-09-11 ENCOUNTER — Other Ambulatory Visit: Payer: Self-pay

## 2020-09-11 ENCOUNTER — Ambulatory Visit (INDEPENDENT_AMBULATORY_CARE_PROVIDER_SITE_OTHER): Payer: No Typology Code available for payment source | Admitting: Podiatry

## 2020-09-11 DIAGNOSIS — E119 Type 2 diabetes mellitus without complications: Secondary | ICD-10-CM

## 2020-09-11 DIAGNOSIS — B351 Tinea unguium: Secondary | ICD-10-CM | POA: Diagnosis not present

## 2020-09-12 NOTE — Progress Notes (Signed)
Subjective: 59 year old female presents the office today for evaluation of nail fungus.  She has been using the topical medication since she states that slowly can be getting better.  Denies any pain in the nails and denies of redness or drainage or any swelling.  She has no other concerns today.  She thinks her last A1c was around 8. Denies any systemic complaints such as fevers, chills, nausea, vomiting. No acute changes since last appointment, and no other complaints at this time.   Objective: AAO x3, NAD DP/PT pulses palpable bilaterally, CRT less than 3 seconds Nails continue be hypertrophic, dystrophic with yellow-brown discoloration but there is clearing on the proximal nail folds.  There is no surrounding edema, erythema and signs of infection.  No pain. No pain with calf compression, swelling, warmth, erythema  Assessment: Onychomycosis, currently on topical treatment  Plan: -All treatment options discussed with the patient including all alternatives, risks, complications.  -As a courtesy I debrided the nails without complications or bleeding.  Continue topical antifungal.  Discussed duration of use as well as side effects and success rates.  Discussed glucose control. -Patient encouraged to call the office with any questions, concerns, change in symptoms.   Trula Slade DPM

## 2020-09-17 MED FILL — PIFELTRO 100 MG TABS: 100 | 30 days supply | Qty: 30 | Fill #5

## 2020-09-17 MED FILL — DESCOVY 200-25 MG TABS: 200-25 | 30 days supply | Qty: 30 | Fill #5

## 2020-10-15 MED FILL — PIFELTRO 100 MG TABS: 100 | 30 days supply | Qty: 30 | Fill #6

## 2020-10-15 MED FILL — DESCOVY 200-25 MG TABS: 200-25 | 30 days supply | Qty: 30 | Fill #6

## 2020-10-16 ENCOUNTER — Other Ambulatory Visit: Payer: Self-pay | Admitting: Internal Medicine

## 2020-10-16 DIAGNOSIS — I10 Essential (primary) hypertension: Secondary | ICD-10-CM

## 2020-10-16 MED FILL — AMLODIPINE BESYLATE 10 MG T: 10 | 30 days supply | Qty: 30 | Fill #0

## 2020-10-16 MED FILL — LISINOPRIL 10 MG TABS: 10 | 30 days supply | Qty: 30 | Fill #0

## 2020-10-16 MED FILL — spIRONOLACTONE 100 MG TAB: 100 | 30 days supply | Qty: 30 | Fill #0

## 2020-10-16 NOTE — Telephone Encounter (Signed)
Patient has appt scheduled on 11/02/20. Courtesy refill provided

## 2020-10-22 ENCOUNTER — Other Ambulatory Visit: Payer: Self-pay | Admitting: Internal Medicine

## 2020-10-22 DIAGNOSIS — Z1231 Encounter for screening mammogram for malignant neoplasm of breast: Secondary | ICD-10-CM

## 2020-11-02 ENCOUNTER — Encounter: Payer: Self-pay | Admitting: Internal Medicine

## 2020-11-02 ENCOUNTER — Other Ambulatory Visit: Payer: Self-pay | Admitting: Internal Medicine

## 2020-11-02 ENCOUNTER — Other Ambulatory Visit: Payer: Self-pay

## 2020-11-02 ENCOUNTER — Ambulatory Visit: Payer: No Typology Code available for payment source | Attending: Internal Medicine | Admitting: Internal Medicine

## 2020-11-02 DIAGNOSIS — R300 Dysuria: Secondary | ICD-10-CM

## 2020-11-02 DIAGNOSIS — B20 Human immunodeficiency virus [HIV] disease: Secondary | ICD-10-CM

## 2020-11-02 DIAGNOSIS — E119 Type 2 diabetes mellitus without complications: Secondary | ICD-10-CM

## 2020-11-02 DIAGNOSIS — Z1211 Encounter for screening for malignant neoplasm of colon: Secondary | ICD-10-CM

## 2020-11-02 DIAGNOSIS — I10 Essential (primary) hypertension: Secondary | ICD-10-CM

## 2020-11-02 DIAGNOSIS — Z6379 Other stressful life events affecting family and household: Secondary | ICD-10-CM

## 2020-11-02 DIAGNOSIS — E1169 Type 2 diabetes mellitus with other specified complication: Secondary | ICD-10-CM

## 2020-11-02 DIAGNOSIS — E785 Hyperlipidemia, unspecified: Secondary | ICD-10-CM

## 2020-11-02 DIAGNOSIS — Z5320 Procedure and treatment not carried out because of patient's decision for unspecified reasons: Secondary | ICD-10-CM | POA: Insufficient documentation

## 2020-11-02 LAB — POCT GLYCOSYLATED HEMOGLOBIN (HGB A1C): HbA1c, POC (controlled diabetic range): 10.6 % — AB (ref 0.0–7.0)

## 2020-11-02 LAB — GLUCOSE, POCT (MANUAL RESULT ENTRY): POC Glucose: 252 mg/dl — AB (ref 70–99)

## 2020-11-02 MED ORDER — SULFAMETHOXAZOLE-TRIMETHOPRIM 800-160 MG PO TABS: 1.0000 | ORAL_TABLET | Freq: Two times a day (BID) | ORAL | 0 refills | Status: DC

## 2020-11-02 MED ORDER — METFORMIN HCL 500 MG PO TABS: 500.0000 mg | ORAL_TABLET | Freq: Two times a day (BID) | ORAL | 4 refills | Status: DC

## 2020-11-02 MED FILL — SULFAMETHOXAZOLE-TMP DS TAB: 800-160 | 7 days supply | Qty: 14 | Fill #0

## 2020-11-02 MED FILL — METFORMIN HCL 500 MG TABS: 500 | 30 days supply | Qty: 60 | Fill #0

## 2020-11-02 NOTE — Progress Notes (Signed)
Patient ID: Wanda Kane, female    DOB: May 27, 1961  MRN: [id number]  CC: Diabetes and Hypertension   Subjective: Wanda Kane is a 59 y.o. female who presents for chronic ds management Her concerns today include:  hx ofHIV(followed by ID with undetected viral load), HTN,DM, obesity,fibroid, HL, pap with pos HPV, DVT with filter, pancreatitis (02/2019 through to be due to med [place]).  Patient was last evaluated in March of this year. DIABETES TYPE 2 Last A1C:   Results for orders placed or performed in visit on 11/02/20  POCT glucose (manual entry)  Result Value Ref Range   POC Glucose 252 (A) 70 - 99 mg/dl  POCT glycosylated hemoglobin (Hb A1C)  Result Value Ref Range   Hemoglobin A1C     HbA1c POC (<> result, manual entry)     HbA1c, POC (prediabetic range)     HbA1c, POC (controlled diabetic range) 10.6 (A) 0.0 - 7.0 %    Med Adherence:  _0  Yes    _1  No -not taking Metformin consistently.  She stopped it in the summer feeling that she can control her diabetes without medication Medication side effects:  _2  Yes    _3  No Home Monitoring?  _4  Yes  3x/wk Home glucose results range:  170s Diet Adherence: _5  Yes    _6  No -eating late at nights and eating out a lot Exercise: has joined the gym and has gone a few times Hypoglycemic episodes?: _7  Yes    _8  No Numbness of the feet? _9  Yes    _10  No Retinopathy hx? _11  Yes    _12  No Last eye exam:  Comments: started graduate school in May.  Had some family issues that have been stressful for her.  Waiting for these to become more manageable "but I see that I need to work on making things more manageable for myself." -she has been talking to a therapist.  Helpful but person not a good fit for her so she plans to try another therapist.    HTN:  Not checking her blood pressure but reports compliance with medications that include lisinopril, amlodipine and spironolactone.  Blood pressure today at her dental visit was  136/72.  She is eating out a lot and reports that some of the food is salted.    HL: On last visit we discussed her lipid profile which was not at goal.  She also had an elevated ASCVD risk score.  She is still not interested in being on statin therapy  Feels she has a urinary tract infection.  Complains of dysuria.  She had been using a bubble bath soap that she thinks caused the issue.  She has since discontinued it.    History of HIV: She follows up once a year with her infectious disease specialist.  Due for next appointment in March of next year.  Reports compliance with medication.  HM:  Received flu shot throught her employer.  Still has not been able to get colonoscopy done due to cost.  Had positive Cologuard in the past and insurance subsequently declined to pay for the colonoscopy.  It has been about 3 years since she has had the Cologuard.   Patient Active Problem List   Diagnosis Date Noted  . Statin declined 11/02/2020  . Fatty liver 03/28/2019  . Hypokalemia 03/11/2019  . Pancreatitis 03/09/2019  . 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 History of DVT (deep vein thrombosis) 03/09/2019  . Presence of IVC filter 03/09/2019  . Leukocytosis  03/09/2019  . Abdominal distention   . Type 2 diabetes mellitus with hyperglycemia ([place]) 03/03/2019  . New onset type 2 diabetes mellitus ([place]) 12/21/2018  . Class 2 severe obesity with serious comorbidity and body mass index (BMI) of 39.0 to 39.9 in adult ([place]) 12/21/2018  . Hyperlipemia, mixed 12/21/2018  . Nail fungus 01/01/2018  . Genital herpes 08/12/2017  . Positive fecal immunochemical test 06/29/2017  . Pap smear of cervix shows high risk HPV present 06/22/2017  . HIV disease ([place]) 11/22/2015  . Hypertension 12/29/2014  . Perimenopause 10/26/2014  . Metabolic syndrome 22/48/2500  . Cutaneous skin tags 10/26/2014  . Back pain 05/16/2014  . Anemia 10/12/2013  . Myalgia 02/09/2013  . Condyloma acuminatum 10/27/2012  . Acquired acanthosis  nigricans 10/27/2012  . Uterine fibroid 10/27/2012     Current Outpatient Medications on File Prior to Visit  Medication Sig Dispense Refill  . amLODipine (NORVASC) 10 MG tablet TAKE 1 TABLET BY MOUTH ONCE DAILY 30 tablet 0  . B-Complex-C TABS Take 1 tablet by mouth daily.    . Blood Glucose Monitoring Suppl (TRUE METRIX METER) w/Device KIT Check sugar once a day 1 kit 0  . ciclopirox (PENLAC) 8 % solution Apply topically at bedtime. Apply over nail and surrounding skin. Apply daily over previous coat. After seven (7) days, may remove with alcohol and continue cycle. 6.6 mL 4  . DESCOVY 200-25 MG tablet TAKE 1 TABLET BY MOUTH DAILY. 30 tablet 11  . glucose blood (TRUE METRIX BLOOD GLUCOSE TEST) test strip Check sugar once a day 100 each 12  . lisinopril (ZESTRIL) 10 MG tablet TAKE 1 TABLET BY MOUTH ONCE A DAY 30 tablet 0  . Multiple Vitamin (MULTIVITAMIN) capsule Take 1 capsule by mouth daily.    . NON FORMULARY Vitamin D, pumpkin seed oil - use as directed    . PIFELTRO 100 MG TABS tablet TAKE 1 TABLET (100 MG TOTAL) BY MOUTH DAILY. 30 tablet 11  . spironolactone (ALDACTONE) 100 MG tablet TAKE 1 TABLET BY MOUTH ONCE DAILY 30 tablet 0  . TRUEPLUS LANCETS 28G MISC Check sugar once a day 100 each 12   No current facility-administered medications on file prior to visit.    Allergies  Allergen Reactions  . Prilosec [Omeprazole] Other (See Comments)  . Viread [Tenofovir Disoproxil] Other (See Comments)    Rhabdomyolysis but she has tolerated TDF and TAF since then    Social History   Socioeconomic History  . Marital status: Single    Spouse name: Not on file  . Number of children: Not on file  . Years of education: college, [place]  . Highest education level: Not on file  Occupational History  . Occupation: Research scientist (life sciences): [employer]  Tobacco Use  . Smoking status: Never Smoker  . Smokeless tobacco: Never Used  Vaping Use  . Vaping Use: Never used  Substance and Sexual  Activity  . Alcohol use: No  . Drug use: No  . Sexual activity: Not Currently    Partners: Male    Comment: patient declined condoms  Other Topics Concern  . 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   She loves her job   Has BSW   Working towards Research officer, political party   Social Determinants of Radio broadcast assistant Strain: Not on [employer] Insecurity: Not on file  Transportation Needs: Not on file  Physical Activity: Not on file  Stress: Not on file  Social Connections: Not on file  Intimate Partner Violence: Not on file    Family History  Problem Relation Age of Onset  . Hypertension Mother   . CAD Brother   . Heart disease Brother 78       cardiac arrest    Past Surgical History:  Procedure Laterality Date  . CESAREAN SECTION  1983  . CESAREAN SECTION    . TONSILLECTOMY AND ADENOIDECTOMY      ROS: Review of Systems Negative except as stated above  PHYSICAL EXAM: BP 128/84   Pulse 75   Temp 98.6 F (37 C)   Resp 16   Wt 233 lb 9.6 oz (106 kg)   LMP 05/31/2016 (Approximate)   SpO2 95%   BMI 40.10 kg/m   Wt Readings from Last 3 Encounters:  11/02/20 233 lb 9.6 oz (106 kg)  02/15/20 223 lb (101.2 kg)  09/16/19 233 lb 12.8 oz (106.1 kg)    Physical Exam  General appearance - alert, well appearing, and in no distress Mental status - normal mood, behavior, speech, dress, motor activity, and thought processes Neck - supple, no significant adenopathy Chest - clear to auscultation, no wheezes, rales or rhonchi, symmetric air entry Heart - normal rate, regular rhythm, normal S1, S2, no murmurs, rubs, clicks or gallops Extremities - peripheral pulses normal, no pedal edema, no clubbing or cyanosis Diabetic Foot Exam - Simple   Simple Foot Form Visual Inspection No deformities, no ulcerations, no other skin breakdown bilaterally: Yes Sensation Testing Intact to touch and monofilament testing bilaterally: Yes Pulse  Check Posterior Tibialis and Dorsalis pulse intact bilaterally: Yes Comments     Depression screen [facility] 2/9 11/02/2020 09/16/2019 04/13/2019  Decreased Interest 0 0 1  Down, Depressed, Hopeless 0 1 1  PHQ - 2 Score 0 1 2  Altered sleeping - - 0  Tired, decreased energy - - 0  Change in appetite - - 0  Feeling bad or failure about yourself  - - 0  Trouble concentrating - - 0  Moving slowly or fidgety/restless - - 0  Suicidal thoughts - - 0  PHQ-9 Score - - 2  Difficult doing work/chores - - Not difficult at all  Some recent data might be hidden     CMP Latest Ref Rng & Units 01/18/2020 05/03/2019 03/29/2019  Glucose 65 - 99 mg/dL 127(H) 163(H) 268(H)  BUN 7 - 25 mg/dL _0 Creatinine 0.50 - 1.05 mg/dL 0.76 1.01 0.81  Sodium 135 - 146 mmol/L 137 133(L) 136  Potassium 3.5 - 5.3 mmol/L 4.2 4.7 4.2  Chloride 98 - 110 mmol/L 103 100 99  CO2 20 - 32 mmol/L _1 Calcium 8.6 - 10.4 mg/dL 9.8 10.1 10.0  Total Protein 6.1 - 8.1 g/dL 7.3 7.5 7.4  Total Bilirubin 0.2 - 1.2 mg/dL 0.6 0.4 0.4  Alkaline Phos 38 - 126 U/L - - -  AST 10 - 35 U/L _2 ALT 6 - 29 U/L _3 Lipid Panel     Component Value Date/Time   CHOL 241 (H) 01/18/2020 0926   CHOL 253 (H) 11/16/2018 0956   TRIG 172 (H) 01/18/2020 0926   HDL 53 01/18/2020 0926   HDL 55 11/16/2018 0956   CHOLHDL 4.5 01/18/2020 0926   VLDL 24 03/07/2019 0618   LDLCALC 157 (H) 01/18/2020 0926    CBC  Component Value Date/Time   WBC 9.4 01/18/2020 0926   RBC 4.57 01/18/2020 0926   HGB 14.3 01/18/2020 0926   HGB 14.0 11/16/2018 0956   HCT 41.2 01/18/2020 0926   HCT 41.6 11/16/2018 0956   PLT 290 01/18/2020 0926   PLT 275 11/16/2018 0956   MCV 90.2 01/18/2020 0926   MCV 88 11/16/2018 0956   MCH 31.3 01/18/2020 0926   MCHC 34.7 01/18/2020 0926   RDW 12.3 01/18/2020 0926   RDW 12.5 11/16/2018 0956   LYMPHSABS 5,678 (H) 01/18/2020 0926   MONOABS 1.4 (H) 03/07/2019 0332   EOSABS 94 01/18/2020 0926    BASOSABS 28 01/18/2020 0926    ASSESSMENT AND PLAN: 1. Type 2 diabetes mellitus with morbid obesity ([place]) Given her A1c, informed patient that it is imperative that we get her on medication and that she stay on medication to help control her diabetes.  Discussed health risks associated with uncontrolled diabetes.  She is agreeable to restarting Metformin.  She was on 250 mg once a day.  We will start her on 500 mg twice a day.  Trulicity would have been a good thing to add with the Metformin but she has had history of pancreatitis in the past. -Discussed and encourage healthy eating habits. Discussed and encourage getting in some moderate intensity exercise at least 3 to 4 days a week for 30 minutes. Check blood sugars once a day alternating before breakfast and before dinner.  Bring readings in on follow-up visit in 3 weeks to see the clinical pharmacist. - POCT glucose (manual entry) - POCT glycosylated hemoglobin (Hb A1C) - Microalbumin / creatinine urine ratio - metFORMIN (GLUCOPHAGE) 500 MG tablet; Take 1 tablet (500 mg total) by mouth 2 (two) times daily with a meal.  Dispense: 60 tablet; Refill: 4  2. Essential hypertension Close to goal.  She will continue her current medications and low-salt diet  3. Dysuria Advised to stop the scented soap - sulfamethoxazole-trimethoprim (BACTRIM DS) 800-160 MG tablet; Take 1 tablet by mouth 2 (two) times daily.  Dispense: 14 tablet; Refill: 0  4. Hyperlipidemia associated with type 2 diabetes mellitus ([place]) Would benefit from statin therapy but patient declines  5. Stressful life event affecting family Commended her on trying to take care of her mental health.  She will pursue finding another therapist  6. Screening for colon cancer Patient will find out from her insurance whether they will pay for her to have a colonoscopy given that she had the positive Cologuard test 3 years or so ago  7. HIV disease ([place]) Plugged in to care with ID  8.  Statin declined See #4 above     Patient was given the opportunity to ask questions.  Patient verbalized understanding of the plan and was able to repeat key elements of the plan.   Orders Placed This Encounter  Procedures  . Microalbumin / creatinine urine ratio  . POCT glucose (manual entry)  . POCT glycosylated hemoglobin (Hb A1C)     Requested Prescriptions   Signed Prescriptions Disp Refills  . sulfamethoxazole-trimethoprim (BACTRIM DS) 800-160 MG tablet 14 tablet 0    Sig: Take 1 tablet by mouth 2 (two) times daily.  . metFORMIN (GLUCOPHAGE) 500 MG tablet 60 tablet 4    Sig: Take 1 tablet (500 mg total) by mouth 2 (two) times daily with a meal.    Return in about 3 months (around 01/31/2021) for Give appt with [facility] in 3 wks for Diabetes recheck.  Karle Plumber, MD, FACP

## 2020-11-02 NOTE — Patient Instructions (Signed)
I recommend that she restart Metformin at 500 mg twice a day.  Please check your blood sugars at least once a day before breakfast or before dinner and record your readings.  We will have you follow-up with the clinical pharmacist in about 3 weeks.  Please bring your readings with you.  Please check with your insurance to see whether they will now pay for you to have a colonoscopy since it has been about 3 years since you had the Cologuard test.  Try to cut back on eating salty foods.   Diabetes Mellitus and Nutrition, Adult When you have diabetes (diabetes mellitus), it is very important to have healthy eating habits because your blood sugar (glucose) levels are greatly affected by what you eat and drink. Eating healthy foods in the appropriate amounts, at about the same times every day, can help you:  Control your blood glucose.  Lower your risk of heart disease.  Improve your blood pressure.  Reach or maintain a healthy weight. Every person with diabetes is different, and each person has different needs for a meal plan. Your health care provider may recommend that you work with a diet and nutrition specialist (dietitian) to make a meal plan that is best for you. Your meal plan may vary depending on factors such as:  The calories you need.  The medicines you take.  Your weight.  Your blood glucose, blood pressure, and cholesterol levels.  Your activity level.  Other health conditions you have, such as heart or kidney disease. How do carbohydrates affect me? Carbohydrates, also called carbs, affect your blood glucose level more than any other type of food. Eating carbs naturally raises the amount of glucose in your blood. Carb counting is a method for keeping track of how many carbs you eat. Counting carbs is important to keep your blood glucose at a healthy level, especially if you use insulin or take certain oral diabetes medicines. It is important to know how many carbs you can  safely have in each meal. This is different for every person. Your dietitian can help you calculate how many carbs you should have at each meal and for each snack. Foods that contain carbs include:  Bread, cereal, rice, pasta, and crackers.  Potatoes and corn.  Peas, beans, and lentils.  Milk and yogurt.  Fruit and juice.  Desserts, such as cakes, cookies, ice cream, and candy. How does alcohol affect me? Alcohol can cause a sudden decrease in blood glucose (hypoglycemia), especially if you use insulin or take certain oral diabetes medicines. Hypoglycemia can be a life-threatening condition. Symptoms of hypoglycemia (sleepiness, dizziness, and confusion) are similar to symptoms of having too much alcohol. If your health care provider says that alcohol is safe for you, follow these guidelines:  Limit alcohol intake to no more than 1 drink per day for nonpregnant women and 2 drinks per day for men. One drink equals 12 oz of beer, 5 oz of wine, or 1 oz of hard liquor.  Do not drink on an empty stomach.  Keep yourself hydrated with water, diet soda, or unsweetened iced tea.  Keep in mind that regular soda, juice, and other mixers may contain a lot of sugar and must be counted as carbs. What are tips for following this plan?  Reading food labels  Start by checking the serving size on the "Nutrition Facts" label of packaged foods and drinks. The amount of calories, carbs, fats, and other nutrients listed on the label is based  on one serving of the item. Many items contain more than one serving per package.  Check the total grams (g) of carbs in one serving. You can calculate the number of servings of carbs in one serving by dividing the total carbs by 15. For example, if a food has 30 g of total carbs, it would be equal to 2 servings of carbs.  Check the number of grams (g) of saturated and trans fats in one serving. Choose foods that have low or no amount of these fats.  Check the  number of milligrams (mg) of salt (sodium) in one serving. Most people should limit total sodium intake to less than 2,300 mg per day.  Always check the nutrition information of foods labeled as "low-fat" or "nonfat". These foods may be higher in added sugar or refined carbs and should be avoided.  Talk to your dietitian to identify your daily goals for nutrients listed on the label. Shopping  Avoid buying canned, premade, or processed foods. These foods tend to be high in fat, sodium, and added sugar.  Shop around the outside edge of the grocery store. This includes fresh fruits and vegetables, bulk grains, fresh meats, and fresh dairy. Cooking  Use low-heat cooking methods, such as baking, instead of high-heat cooking methods like deep frying.  Cook using healthy oils, such as olive, canola, or sunflower oil.  Avoid cooking with butter, cream, or high-fat meats. Meal planning  Eat meals and snacks regularly, preferably at the same times every day. Avoid going long periods of time without eating.  Eat foods high in fiber, such as fresh fruits, vegetables, beans, and whole grains. Talk to your dietitian about how many servings of carbs you can eat at each meal.  Eat 4-6 ounces (oz) of lean protein each day, such as lean meat, chicken, fish, eggs, or tofu. One oz of lean protein is equal to: ? 1 oz of meat, chicken, or fish. ? 1 egg. ?  cup of tofu.  Eat some foods each day that contain healthy fats, such as avocado, nuts, seeds, and fish. Lifestyle  Check your blood glucose regularly.  Exercise regularly as told by your health care provider. This may include: ? 150 minutes of moderate-intensity or vigorous-intensity exercise each week. This could be brisk walking, biking, or water aerobics. ? Stretching and doing strength exercises, such as yoga or weightlifting, at least 2 times a week.  Take medicines as told by your health care provider.  Do not use any products that  contain nicotine or tobacco, such as cigarettes and e-cigarettes. If you need help quitting, ask your health care provider.  Work with a Social worker or diabetes educator to identify strategies to manage stress and any emotional and social challenges. Questions to ask a health care provider  Do I need to meet with a diabetes educator?  Do I need to meet with a dietitian?  What number can I call if I have questions?  When are the best times to check my blood glucose? Where to find more information:  American Diabetes Association: diabetes.org  Academy of Nutrition and Dietetics: www.eatright.[employer] of Diabetes and Digestive and Kidney Diseases (NIH): [employer] Summary  A healthy meal plan will help you control your blood glucose and maintain a healthy lifestyle.  Working with a diet and nutrition specialist (dietitian) can help you make a meal plan that is best for you.  Keep in mind that carbohydrates (carbs) and alcohol have immediate effects on  your blood glucose levels. It is important to count carbs and to use alcohol carefully. This information is not intended to replace advice given to you by your health care provider. Make sure you discuss any questions you have with your health care provider. Document Revised: 10/16/2017 Document Reviewed: 12/08/2016 Elsevier Patient Education  2020 [employer].

## 2020-11-04 ENCOUNTER — Encounter: Payer: Self-pay | Admitting: Internal Medicine

## 2020-11-04 ENCOUNTER — Other Ambulatory Visit: Payer: Self-pay | Admitting: Internal Medicine

## 2020-11-04 DIAGNOSIS — I10 Essential (primary) hypertension: Secondary | ICD-10-CM

## 2020-11-04 DIAGNOSIS — R809 Proteinuria, unspecified: Secondary | ICD-10-CM

## 2020-11-04 DIAGNOSIS — E1129 Type 2 diabetes mellitus with other diabetic kidney complication: Secondary | ICD-10-CM | POA: Insufficient documentation

## 2020-11-04 HISTORY — DX: Type 2 diabetes mellitus with other diabetic kidney complication: E11.29

## 2020-11-04 HISTORY — DX: Proteinuria, unspecified: R80.9

## 2020-11-04 LAB — MICROALBUMIN / CREATININE URINE RATIO
Creatinine, Urine: 212.7 mg/dL
Microalb/Creat Ratio: 119 mg/g creat — ABNORMAL HIGH (ref 0–29)
Microalbumin, Urine: 253.4 ug/mL

## 2020-11-04 MED ORDER — LISINOPRIL 10 MG PO TABS: 15.0000 mg | ORAL_TABLET | Freq: Every day | ORAL | 1 refills | Status: DC

## 2020-11-05 ENCOUNTER — Other Ambulatory Visit: Payer: Self-pay | Admitting: Internal Medicine

## 2020-11-05 ENCOUNTER — Encounter: Payer: Self-pay | Admitting: Internal Medicine

## 2020-11-05 DIAGNOSIS — I10 Essential (primary) hypertension: Secondary | ICD-10-CM

## 2020-11-05 MED FILL — LISINOPRIL 10 MG TABS: 10 | 90 days supply | Qty: 135 | Fill #0

## 2020-11-12 MED FILL — PIFELTRO 100 MG TABS: 100 | 30 days supply | Qty: 30 | Fill #7

## 2020-11-12 MED FILL — spIRONOLACTONE 100 MG TAB: 100 | 30 days supply | Qty: 30 | Fill #0

## 2020-11-12 MED FILL — DESCOVY 200-25 MG TABS: 200-25 | 30 days supply | Qty: 30 | Fill #7

## 2020-11-26 ENCOUNTER — Encounter: Payer: Self-pay | Admitting: Pharmacist

## 2020-11-26 ENCOUNTER — Ambulatory Visit: Payer: 59 | Attending: Internal Medicine | Admitting: Pharmacist

## 2020-11-26 ENCOUNTER — Other Ambulatory Visit: Payer: Self-pay

## 2020-11-26 ENCOUNTER — Other Ambulatory Visit: Payer: Self-pay | Admitting: Internal Medicine

## 2020-11-26 DIAGNOSIS — E1169 Type 2 diabetes mellitus with other specified complication: Secondary | ICD-10-CM | POA: Diagnosis not present

## 2020-11-26 MED ORDER — METFORMIN HCL 500 MG PO TABS: 250.0000 mg | ORAL_TABLET | Freq: Every day | ORAL | 2 refills | Status: DC

## 2020-11-26 MED FILL — AMLODIPINE BESYLATE 10 MG T: 10 | 30 days supply | Qty: 30 | Fill #0

## 2020-11-26 NOTE — Progress Notes (Signed)
    S:    PCP: Dr. Wynetta Emery  No chief complaint on file.  Patient arrives in good spirits. Presents for diabetes evaluation, education, and management. Patient was referred and last seen by Primary Care Provider on 11/02/2020.  Of note, pt was treated for a UTI (12/17) and developed diarrhea secondary to abx use. This was exacerbated by metformin use. Since then, pt has decreased to 250 mg (1/2 of a 500 mg tablet) of metformin once daily. Her diarrhea has resolved.   Patient reports Diabetes was diagnosed in  The last 1-2 years.   Family/Social History:  - FHx: HTN, CAD, heart disease (cardiac arrest)  - Alcohol: no use reported  - Tobacco: never smoker   Insurance coverage/medication affordability: none   Medication adherence reported.   Current diabetes medications include: metformin 500 mg BID (pt take 250 mg daily; see above)  Patient denies hypoglycemic events.  Patient reported dietary habits:  - Eats 2 meals per day  - Uses intermittent fasting with 18/6 pattern; her eating times are 1p and 7p - Has eliminated fast foods, sugars - Endorses a mainly low carb, vegetarian-based diet   Patient-reported exercise habits:  - Daily (either formally at the gym or by walking on her own)   Patient denies nocturia (nighttime urination).  Patient denies neuropathy (nerve pain). Patient denies visual changes. Patient reports self foot exams.     O:  Physical Exam   ROS   Lab Results  Component Value Date   HGBA1C 10.6 (A) 11/02/2020   There were no vitals filed for this visit.  Lipid Panel     Component Value Date/Time   CHOL 241 (H) 01/18/2020 0926   CHOL 253 (H) 11/16/2018 0956   TRIG 172 (H) 01/18/2020 0926   HDL 53 01/18/2020 0926   HDL 55 11/16/2018 0956   CHOLHDL 4.5 01/18/2020 0926   VLDL 24 03/07/2019 0618   LDLCALC 157 (H) 01/18/2020 0926   Home fasting blood sugars: 97 - 134 (1 outlier of 143)   Clinical Atherosclerotic Cardiovascular Disease  (ASCVD): No  The 10-year ASCVD risk score Mikey Bussing DC Jr., et al., 2013) is: 17.6%   Values used to calculate the score:     Age: 60 years     Sex: Female     Is Non-Hispanic African American: Yes     Diabetic: Yes     Tobacco smoker: No     Systolic Blood Pressure: 527 mmHg     Is BP treated: Yes     HDL Cholesterol: 53 mg/dL     Total Cholesterol: 241 mg/dL    A/P: Diabetes longstanding currently with A1c 10.7, however, home glycemic control has improved drastically since last PCP visit. Patient is able to verbalize appropriate hypoglycemia management plan. Medication adherence appears optimal. Lifestyle modifications, such as her diet and activity level, have contributed to improved glycemic control at home. Commended her on this! -Continued metformin 250 mg daily.  -Extensively discussed pathophysiology of diabetes, recommended lifestyle interventions, dietary effects on blood sugar control -Counseled on s/sx of and management of hypoglycemia -Next A1C anticipated 01/2021.   Written patient instructions provided.  Total time in face to face counseling 30 minutes.   Follow up PCP Clinic Visit in March.   Benard Halsted, PharmD, [place], [place] [phone]

## 2020-11-29 ENCOUNTER — Ambulatory Visit: Payer: 59 | Attending: Internal Medicine | Admitting: Internal Medicine

## 2020-11-29 ENCOUNTER — Encounter: Payer: Self-pay | Admitting: Internal Medicine

## 2020-11-29 ENCOUNTER — Other Ambulatory Visit: Payer: Self-pay | Admitting: Internal Medicine

## 2020-11-29 ENCOUNTER — Ambulatory Visit (HOSPITAL_COMMUNITY)
Admission: EM | Admit: 2020-11-29 | Discharge: 2020-11-29 | Disposition: A | Discharge status: Home / Self Care | Payer: 59 | Attending: Family Medicine | Admitting: Family Medicine

## 2020-11-29 ENCOUNTER — Encounter (HOSPITAL_COMMUNITY): Payer: Self-pay

## 2020-11-29 ENCOUNTER — Other Ambulatory Visit: Payer: Self-pay

## 2020-11-29 VITALS — BP 134/81 | HR 90 | Resp 16 | Wt 226.2 lb

## 2020-11-29 DIAGNOSIS — L02419 Cutaneous abscess of limb, unspecified: Secondary | ICD-10-CM

## 2020-11-29 DIAGNOSIS — L02411 Cutaneous abscess of right axilla: Secondary | ICD-10-CM

## 2020-11-29 DIAGNOSIS — E669 Obesity, unspecified: Secondary | ICD-10-CM | POA: Diagnosis not present

## 2020-11-29 DIAGNOSIS — E1169 Type 2 diabetes mellitus with other specified complication: Secondary | ICD-10-CM | POA: Diagnosis not present

## 2020-11-29 MED ORDER — SULFAMETHOXAZOLE-TRIMETHOPRIM 800-160 MG PO TABS: 1.0000 | ORAL_TABLET | Freq: Two times a day (BID) | ORAL | 0 refills | Status: DC

## 2020-11-29 MED FILL — SULFAMETHOXAZOLE-TMP DS TAB: 800-160 | 7 days supply | Qty: 14 | Fill #0

## 2020-11-29 NOTE — Progress Notes (Signed)
Patient ID: Wanda Kane, female    DOB: Apr 07, 1961  MRN: [id number]  CC: Recurrent Skin Infections (Boil under right armpit)   Subjective: Wanda Kane is a 60 y.o. female who presents for acute visit Her concerns today include:  hx ofHIV(followed by ID with undetected viral load), HTN,DM, obesity,fibroid, HL, pap with pos HPV, DVT with filter, pancreatitis (02/2019 through to be due to med [place]).  She has a boil under RT arm x 4-5 days. Noticed a little white drainage.  She purchased some Neosporin and adhesive dressing.  DM:  Loss 7 lbs since last visit with me Doing much better with eating habits.  Working out 2-3 x a wk - walking trail where she does some walking and running Check BS daily to QOD.  All under 130. Patient Active Problem List   Diagnosis Date Noted  . Microalbuminuria due to type 2 diabetes mellitus ([place]) 11/04/2020  . Statin declined 11/02/2020  . Fatty liver 03/28/2019  . Hypokalemia 03/11/2019  . Pancreatitis 03/09/2019  . 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 History of DVT (deep vein thrombosis) 03/09/2019  . Presence of IVC filter 03/09/2019  . Leukocytosis 03/09/2019  . Abdominal distention   . Type 2 diabetes mellitus with hyperglycemia ([place]) 03/03/2019  . New onset type 2 diabetes mellitus ([place]) 12/21/2018  . Class 2 severe obesity with serious comorbidity and body mass index (BMI) of 39.0 to 39.9 in adult ([place]) 12/21/2018  . Hyperlipemia, mixed 12/21/2018  . Nail fungus 01/01/2018  . Genital herpes 08/12/2017  . Positive fecal immunochemical test 06/29/2017  . Pap smear of cervix shows high risk HPV present 06/22/2017  . HIV disease ([place]) 11/22/2015  . Hypertension 12/29/2014  . Perimenopause 10/26/2014  . Metabolic syndrome 16/38/4536  . Cutaneous skin tags 10/26/2014  . Back pain 05/16/2014  . Anemia 10/12/2013  . Myalgia 02/09/2013  . Condyloma acuminatum 10/27/2012  . Acquired acanthosis nigricans 10/27/2012  . Uterine fibroid  10/27/2012  . Hyperlipidemia associated with type 2 diabetes mellitus ([place]) 10/18/2012     Current Outpatient Medications on File Prior to Visit  Medication Sig Dispense Refill  . amLODipine (NORVASC) 10 MG tablet TAKE 1 TABLET BY MOUTH ONCE DAILY 30 tablet 0  . B-Complex-C TABS Take 1 tablet by mouth daily.    . Blood Glucose Monitoring Suppl (TRUE METRIX METER) w/Device KIT Check sugar once a day 1 kit 0  . ciclopirox (PENLAC) 8 % solution Apply topically at bedtime. Apply over nail and surrounding skin. Apply daily over previous coat. After seven (7) days, may remove with alcohol and continue cycle. 6.6 mL 4  . DESCOVY 200-25 MG tablet TAKE 1 TABLET BY MOUTH DAILY. 30 tablet 11  . glucose blood (TRUE METRIX BLOOD GLUCOSE TEST) test strip Check sugar once a day 100 each 12  . lisinopril (ZESTRIL) 10 MG tablet Take 1.5 tablets (15 mg total) by mouth daily. 135 tablet 1  . metFORMIN (GLUCOPHAGE) 500 MG tablet Take 0.5 tablets (250 mg total) by mouth daily. 15 tablet 2  . Multiple Vitamin (MULTIVITAMIN) capsule Take 1 capsule by mouth daily.    . NON FORMULARY Vitamin D, pumpkin seed oil - use as directed    . PIFELTRO 100 MG TABS tablet TAKE 1 TABLET (100 MG TOTAL) BY MOUTH DAILY. 30 tablet 11  . spironolactone (ALDACTONE) 100 MG tablet TAKE 1 TABLET BY MOUTH ONCE DAILY 30 tablet 0  . sulfamethoxazole-trimethoprim (BACTRIM DS) 800-160 MG tablet Take 1 tablet by mouth 2 (  two) times daily. 14 tablet 0  . TRUEPLUS LANCETS 28G MISC Check sugar once a day 100 each 12   No current facility-administered medications on file prior to visit.    Allergies  Allergen Reactions  . Prilosec [Omeprazole] Other (See Comments)  . Viread [Tenofovir Disoproxil] Other (See Comments)    Rhabdomyolysis but she has tolerated TDF and TAF since then    Social History   Socioeconomic History  . Marital status: Single    Spouse name: Not on file  . Number of children: Not on file  . Years of education:  college, [place]  . Highest education level: Not on file  Occupational History  . Occupation: Research scientist (life sciences):   Tobacco Use  . Smoking status: Never Smoker  . Smokeless tobacco: Never Used  Vaping Use  . Vaping Use: Never used  Substance and Sexual Activity  . Alcohol use: No  . Drug use: No  . Sexual activity: Not Currently    Partners: Male    Comment: patient declined condoms  Other Topics Concern  . 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   She loves her job   Has BSW   Working towards LPN and Therapist, sports   Social Determinants of Radio broadcast assistant Strain: Not on [employer] Insecurity: Not on file  Transportation Needs: Not on file  Physical Activity: Not on file  Stress: Not on file  Social Connections: Not on file  Intimate Partner Violence: Not on file    Family History  Problem Relation Age of Onset  . Hypertension Mother   . CAD Brother   . Heart disease Brother 32       cardiac arrest    Past Surgical History:  Procedure Laterality Date  . CESAREAN SECTION  1983  . CESAREAN SECTION    . TONSILLECTOMY AND ADENOIDECTOMY      ROS: Review of Systems Negative except as stated above  PHYSICAL EXAM: BP 134/81   Pulse 90   Resp 16   Wt 226 lb 3.2 oz (102.6 kg)   LMP 05/31/2016 (Approximate)   SpO2 96%   BMI 38.83 kg/m   Wt Readings from Last 3 Encounters:  11/29/20 226 lb 3.2 oz (102.6 kg)  11/02/20 233 lb 9.6 oz (106 kg)  02/15/20 223 lb (101.2 kg)    Physical Exam  General appearance - alert, well appearing, and in no distress Mental status - normal mood, behavior, speech, dress, motor activity, and thought processes Skin -patient has about a 4 cm mildly erythematous indurated area in the right axilla.  At the center of it is a whitehead.  On touching this area, small amount of blood and pus came out.  No fluctuance appreciated.   CMP Latest Ref Rng & Units 01/18/2020 05/03/2019  03/29/2019  Glucose 65 - 99 mg/dL 127(H) 163(H) 268(H)  BUN 7 - 25 mg/dL 15 16 12   Creatinine 0.50 - 1.05 mg/dL 0.76 1.01 0.81  Sodium 135 - 146 mmol/L 137 133(L) 136  Potassium 3.5 - 5.3 mmol/L 4.2 4.7 4.2  Chloride 98 - 110 mmol/L 103 100 99  CO2 20 - 32 mmol/L 23 24 26   Calcium 8.6 - 10.4 mg/dL 9.8 10.1 10.0  Total Protein 6.1 - 8.1 g/dL 7.3 7.5 7.4  Total Bilirubin 0.2 - 1.2 mg/dL 0.6 0.4 0.4  Alkaline Phos 38 - 126 U/L - - -  AST 10 - 35 U/L 25 18 22   ALT 6 - 29 U/L 23 18 24    Lipid Panel     Component Value Date/Time   CHOL 241 (H) 01/18/2020 0926   CHOL 253 (H) 11/16/2018 0956   TRIG 172 (H) 01/18/2020 0926   HDL 53 01/18/2020 0926   HDL 55 11/16/2018 0956   CHOLHDL 4.5 01/18/2020 0926   VLDL 24 03/07/2019 0618   LDLCALC 157 (H) 01/18/2020 0926    CBC    Component Value Date/Time   WBC 9.4 01/18/2020 0926   RBC 4.57 01/18/2020 0926   HGB 14.3 01/18/2020 0926   HGB 14.0 11/16/2018 0956   HCT 41.2 01/18/2020 0926   HCT 41.6 11/16/2018 0956   PLT 290 01/18/2020 0926   PLT 275 11/16/2018 0956   MCV 90.2 01/18/2020 0926   MCV 88 11/16/2018 0956   MCH 31.3 01/18/2020 0926   MCHC 34.7 01/18/2020 0926   RDW 12.3 01/18/2020 0926   RDW 12.5 11/16/2018 0956   LYMPHSABS 5,678 (H) 01/18/2020 0926   MONOABS 1.4 (H) 03/07/2019 0332   EOSABS 94 01/18/2020 0926   BASOSABS 28 01/18/2020 0926    ASSESSMENT AND PLAN: 1. Abscess, axilla Advised patient to be seen in the emergency room or urgent care on the side of the emergency room to have this lanced.  I have placed her on Bactrim antibiotics for 7 days.  Alternatively, she can take the antibiotics and see one of the there is improvement over the next 24 to 48 hours.  If it does not improve or expands she definitely will need to have it lanced. - sulfamethoxazole-trimethoprim (BACTRIM DS) 800-160 MG tablet; Take 1 tablet by mouth 2 (two) times daily.  Dispense: 14 tablet; Refill: 0  2. Type 2 diabetes mellitus with obesity  ([place]) Commended her on dietary changes and exercise.  Encouraged her to keep up the good work.  Continue metformin.    Patient was given the opportunity to ask questions.  Patient verbalized understanding of the plan and was able to repeat key elements of the plan.   No orders of the defined types were placed in this encounter.    Requested Prescriptions    No prescriptions requested or ordered in this encounter    No follow-ups on file.  Karle Plumber, MD, FACP

## 2020-11-29 NOTE — ED Provider Notes (Signed)
[facility]    CSN: [id number] Arrival date & time: 11/29/20  1215      History   Chief Complaint Chief Complaint  Patient presents with  . Abscess    HPI Wanda Kane is a 60 y.o. female.   HPI   Patient has an abscess under her right arm.  Its been growing for the last 4 days.  She went to her primary care morning who started her on Bactrim 1 pill every 12 hours for 7 days.  She told her that the area needed to be lanced and referred her here.  She has no fever here, she states that it is very painful. Patient states she was recently diagnosed with hyperglycemia and started on metformin.  She has been dieting and walking, has lost weight and has brought her sugars down.  Has not had any increased sugars with the infection  Past Medical History:  Diagnosis Date  . DVT (deep venous thrombosis) ([place]) Jan 2016  . Fibroids 2010  . Genital herpes 08/12/2017  . HIV infection ([place]) 1994  . Hypertension 1994  . Medical history non-contributory   . Microalbuminuria due to type 2 diabetes mellitus ([place]) 11/04/2020  . 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 Pancreatitis 03/09/2019  . Pancreatitis   . Pre-diabetes   . Type 2 diabetes mellitus with hyperglycemia ([place]) 03/03/2019   diagnosed in December 2019  . Vaginal bleeding 12/2014   due to xarelto    Patient Active Problem List   Diagnosis Date Noted  . Microalbuminuria due to type 2 diabetes mellitus ([place]) 11/04/2020  . Statin declined 11/02/2020  . Fatty liver 03/28/2019  . Hypokalemia 03/11/2019  . Pancreatitis 03/09/2019  . 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 History of DVT (deep vein thrombosis) 03/09/2019  . Presence of IVC filter 03/09/2019  . Leukocytosis 03/09/2019  . Abdominal distention   . Type 2 diabetes mellitus with hyperglycemia ([place]) 03/03/2019  . New onset type 2 diabetes mellitus ([place]) 12/21/2018  . Class 2 severe obesity with serious comorbidity and body mass index (BMI) of 39.0 to 39.9 in adult ([place]) 12/21/2018   . Hyperlipemia, mixed 12/21/2018  . Nail fungus 01/01/2018  . Genital herpes 08/12/2017  . Positive fecal immunochemical test 06/29/2017  . Pap smear of cervix shows high risk HPV present 06/22/2017  . HIV disease ([place]) 11/22/2015  . Hypertension 12/29/2014  . Perimenopause 10/26/2014  . Metabolic syndrome 23/53/6144  . Cutaneous skin tags 10/26/2014  . Back pain 05/16/2014  . Anemia 10/12/2013  . Myalgia 02/09/2013  . Condyloma acuminatum 10/27/2012  . Acquired acanthosis nigricans 10/27/2012  . Uterine fibroid 10/27/2012  . Hyperlipidemia associated with type 2 diabetes mellitus ([place]) 10/18/2012    Past Surgical History:  Procedure Laterality Date  . CESAREAN SECTION  1983  . CESAREAN SECTION    . TONSILLECTOMY AND ADENOIDECTOMY      OB History    Gravida  1   Para  1   Term  1   Preterm  0   AB  0   Living  1     SAB  0   IAB  0   Ectopic  0   Multiple  0   Live Births               Home Medications    Prior to Admission medications   Medication Sig Start Date End Date Taking? Authorizing Provider  amLODipine (NORVASC) 10 MG tablet TAKE 1 TABLET BY MOUTH ONCE DAILY 11/05/20  [name], MD  B-Complex-C TABS Take 1 tablet by mouth daily.    [provider]  Blood Glucose Monitoring Suppl (TRUE METRIX METER) w/Device KIT Check sugar once a day 12/21/18   [name], MD  ciclopirox [facility]) 8 % solution Apply topically at bedtime. Apply over nail and surrounding skin. Apply daily over previous coat. After seven (7) days, may remove with alcohol and continue cycle. 11/24/19   Trula Slade, DPM  DESCOVY 200-25 MG tablet TAKE 1 TABLET BY MOUTH DAILY. 04/27/20   Truman Hayward, MD  glucose blood (TRUE METRIX BLOOD GLUCOSE TEST) test strip Check sugar once a day 12/21/18   [name], MD  lisinopril (ZESTRIL) 10 MG tablet Take 1.5 tablets (15 mg total) by mouth daily. 11/04/20   [name], MD  metFORMIN  (GLUCOPHAGE) 500 MG tablet Take 0.5 tablets (250 mg total) by mouth daily. 11/26/20   [name], MD  Multiple Vitamin (MULTIVITAMIN) capsule Take 1 capsule by mouth daily.    [provider]  NON FORMULARY Vitamin D, pumpkin seed oil - use as directed    [provider]  PIFELTRO 100 MG TABS tablet TAKE 1 TABLET (100 MG TOTAL) BY MOUTH DAILY. 04/27/20   Truman Hayward, MD  spironolactone (ALDACTONE) 100 MG tablet TAKE 1 TABLET BY MOUTH ONCE DAILY 11/05/20   [name], MD  sulfamethoxazole-trimethoprim (BACTRIM DS) 800-160 MG tablet Take 1 tablet by mouth 2 (two) times daily. 11/29/20   [name], MD  TRUEPLUS LANCETS 28G MISC Check sugar once a day 12/21/18   [name], MD    Family History Family History  Problem Relation Age of Onset  . Hypertension Mother   . CAD Brother   . Heart disease Brother 70       cardiac arrest    Social History Social History   Tobacco Use  . Smoking status: Never Smoker  . Smokeless tobacco: Never Used  Vaping Use  . Vaping Use: Never used  Substance Use Topics  . Alcohol use: No  . Drug use: No     Allergies   Prilosec [omeprazole] and Viread [tenofovir disoproxil]   Review of Systems Review of Systems See HPI  Physical Exam Triage Vital Signs ED Triage Vitals  Enc Vitals Group     BP 11/29/20 1327 125/79     Pulse Rate 11/29/20 1327 (!) 101     Resp 11/29/20 1327 16     Temp 11/29/20 1327 98.4 F (36.9 C)     Temp Source 11/29/20 1327 Oral     SpO2 11/29/20 1327 98 %     Weight --      Height --      Head Circumference --      Peak Flow --      Pain Score 11/29/20 1325 3     Pain Loc --      Pain Edu? --      Excl. in [place]? --    No data found.  Updated Vital Signs BP 125/79 (BP Location: Left Arm)   Pulse (!) 101   Temp 98.4 F (36.9 C) (Oral)   Resp 16   LMP 05/31/2016 (Approximate)   SpO2 98%       Physical Exam Constitutional:      General: She is not in  acute distress.    Appearance: She is well-developed and well-nourished.  HENT:  Head: Normocephalic and atraumatic.     Mouth/Throat:     Mouth: Oropharynx is clear and moist.  Eyes:     Conjunctiva/sclera: Conjunctivae normal.     Pupils: Pupils are equal, round, and reactive to light.  Cardiovascular:     Rate and Rhythm: Normal rate.  Pulmonary:     Effort: Pulmonary effort is normal. No respiratory distress.  Abdominal:     General: There is no distension.     Palpations: Abdomen is soft.  Musculoskeletal:        General: No edema. Normal range of motion.     Cervical back: Normal range of motion.  Skin:    General: Skin is warm and dry.     Comments: In the inferior portion of the right axilla there is a 4 to 5 cm area of deep erythema with dimpling of the skin.  There is a fluctuant center.  Area is very tender.  Neurological:     Mental Status: She is alert.  Psychiatric:        Behavior: Behavior normal.      UC Treatments / Results  Labs (all labs ordered are listed, but only abnormal results are displayed) Labs Reviewed - No data to display  EKG   Radiology No results found.  Procedures Incision and Drainage  Date/Time: 11/29/2020 2:27 PM Performed by: Raylene Everts, MD Authorized by: Raylene Everts, MD   Consent:    Consent obtained:  Verbal   Consent given by:  Patient   Risks, benefits, and alternatives were discussed: yes     Alternatives discussed:  Delayed treatment Universal protocol:    Patient identity confirmed:  Verbally with patient and arm band Location:    Type:  Abscess   Size:  5 cm   Location:  Upper extremity Anesthesia:    Anesthesia method:  Local infiltration   Local anesthetic:  Lidocaine 2% w/o epi Procedure type:    Complexity:  Simple Procedure details:    Ultrasound guidance: no     Needle aspiration: no     Incision types:  Stab incision   Incision depth:  Subcutaneous   Drainage:  Bloody and  purulent   Drainage amount:  Copious   Wound treatment:  Drain placed and wound left open   Packing materials:  1/2 in iodoform gauze   Amount 1/2" iodoform:  5 cm Post-procedure details:    Procedure completion:  Tolerated well, no immediate complications   (including critical care time)  Medications Ordered in UC Medications - No data to display  Initial Impression / Assessment and Plan / UC Course  I have reviewed the triage vital signs and the nursing notes.  Pertinent labs & imaging results that were available during my care of the patient were reviewed by me and considered in my medical decision making (see chart for details).     Wound care discussed.  Return if needed a gauze wick was placed with half inch iodoform to keep wound open so we will continue to drain.  This should fall out on its own within a couple of days.  Patient can return if she requires any assistance. Final Clinical Impressions(s) / UC Diagnoses   Final diagnoses:  Abscess of axilla, right  With early cellulitis   Discharge Instructions     Keep wick in the wound for a couple of days to promote deainage Warm compresses Take the antibiotic as directed Return as needed   ED Prescriptions  None     PDMP not reviewed this encounter.   Raylene Everts, MD 11/29/20 [phone]

## 2020-11-29 NOTE — Patient Instructions (Signed)
He will have a boil under the right arm.  Looks like it needs to be lanced.  You can be seen in the emergency room or the urgent care associated with the emergency room to have it lanced.  I have started you on the antibiotic Bactrim which you should take twice a day for 7 days.

## 2020-11-29 NOTE — ED Triage Notes (Signed)
Pt presents with an abscess under her right arm x 4 days. Pt states the abscess has gotten worse over night.

## 2020-11-29 NOTE — Discharge Instructions (Signed)
Keep wick in the wound for a couple of days to promote deainage Warm compresses Take the antibiotic as directed Return as needed

## 2020-12-04 ENCOUNTER — Ambulatory Visit: Payer: No Typology Code available for payment source

## 2020-12-06 ENCOUNTER — Ambulatory Visit (HOSPITAL_COMMUNITY): Payer: Self-pay

## 2020-12-11 MED FILL — DESCOVY 200-25 MG TABS: 200-25 | 30 days supply | Qty: 30 | Fill #8

## 2020-12-11 MED FILL — PIFELTRO 100 MG TABS: 100 | 30 days supply | Qty: 30 | Fill #8

## 2020-12-21 ENCOUNTER — Ambulatory Visit: Payer: No Typology Code available for payment source

## 2021-01-01 ENCOUNTER — Other Ambulatory Visit: Payer: Self-pay | Admitting: Internal Medicine

## 2021-01-01 DIAGNOSIS — I10 Essential (primary) hypertension: Secondary | ICD-10-CM

## 2021-01-01 MED FILL — spIRONOLACTONE 100 MG TAB: 100 | 30 days supply | Qty: 30 | Fill #0

## 2021-01-01 MED FILL — AMLODIPINE BESYLATE 10 MG T: 10 | 30 days supply | Qty: 30 | Fill #0

## 2021-01-08 MED FILL — DESCOVY 200-25 MG TABS: 200-25 | 30 days supply | Qty: 30 | Fill #9

## 2021-01-08 MED FILL — PIFELTRO 100 MG TABS: 100 | 30 days supply | Qty: 30 | Fill #9

## 2021-01-09 ENCOUNTER — Ambulatory Visit: Payer: 59

## 2021-01-21 ENCOUNTER — Other Ambulatory Visit: Payer: 59

## 2021-01-21 ENCOUNTER — Other Ambulatory Visit: Payer: Self-pay

## 2021-01-21 DIAGNOSIS — B20 Human immunodeficiency virus [HIV] disease: Secondary | ICD-10-CM | POA: Diagnosis not present

## 2021-01-22 LAB — T-HELPER CELL (CD4) - (RCID CLINIC ONLY)
CD4 % Helper T Cell: 22 % — ABNORMAL LOW (ref 33–65)
CD4 T Cell Abs: 1111 /uL (ref 400–1790)

## 2021-01-24 LAB — COMPLETE METABOLIC PANEL WITH GFR
AG Ratio: 1.6 (calc) (ref 1.0–2.5)
ALT: 17 U/L (ref 6–29)
AST: 20 U/L (ref 10–35)
Albumin: 4.3 g/dL (ref 3.6–5.1)
Alkaline phosphatase (APISO): 84 U/L (ref 37–153)
BUN: 21 mg/dL (ref 7–25)
CO2: 26 mmol/L (ref 20–32)
Calcium: 10.1 mg/dL (ref 8.6–10.4)
Chloride: 104 mmol/L (ref 98–110)
Creat: 0.76 mg/dL (ref 0.50–0.99)
GFR, Est African American: 99 mL/min/{1.73_m2} (ref >=60)
GFR, Est Non African American: 85 mL/min/{1.73_m2} (ref >=60)
Globulin: 2.7 g/dL (calc) (ref 1.9–3.7)
Glucose, Bld: 160 mg/dL — ABNORMAL HIGH (ref 65–99)
Potassium: 4.1 mmol/L (ref 3.5–5.3)
Sodium: 140 mmol/L (ref 135–146)
Total Bilirubin: 0.5 mg/dL (ref 0.2–1.2)
Total Protein: 7 g/dL (ref 6.1–8.1)

## 2021-01-24 LAB — CBC WITH DIFFERENTIAL/PLATELET
Absolute Monocytes: 541 cells/uL (ref 200–950)
Basophils Absolute: 52 cells/uL (ref 0–200)
Basophils Relative: 0.5 %
Eosinophils Absolute: 166 cells/uL (ref 15–500)
Eosinophils Relative: 1.6 %
HCT: 41.6 % (ref 35.0–45.0)
Hemoglobin: 14.2 g/dL (ref 11.7–15.5)
Lymphs Abs: 5450 cells/uL — ABNORMAL HIGH (ref 850–3900)
MCH: 31.3 pg (ref 27.0–33.0)
MCHC: 34.1 g/dL (ref 32.0–36.0)
MCV: 91.8 fL (ref 80.0–100.0)
MPV: 12.3 fL (ref 7.5–12.5)
Monocytes Relative: 5.2 %
Neutro Abs: 4191 cells/uL (ref 1500–7800)
Neutrophils Relative %: 40.3 %
Platelets: 310 10*3/uL (ref 140–400)
RBC: 4.53 10*6/uL (ref 3.80–5.10)
RDW: 12.9 % (ref 11.0–15.0)
Total Lymphocyte: 52.4 %
WBC: 10.4 10*3/uL (ref 3.8–10.8)

## 2021-01-24 LAB — LIPID PANEL
Cholesterol: 258 mg/dL — ABNORMAL HIGH (ref <200)
HDL: 53 mg/dL (ref >=50)
LDL Cholesterol (Calc): 170 mg/dL (calc) — ABNORMAL HIGH
Non-HDL Cholesterol (Calc): 205 mg/dL (calc) — ABNORMAL HIGH (ref <130)
Total CHOL/HDL Ratio: 4.9 (calc) (ref <5.0)
Triglycerides: 195 mg/dL — ABNORMAL HIGH (ref <150)

## 2021-01-24 LAB — HIV-1 RNA QUANT-NO REFLEX-BLD
HIV 1 RNA Quant: NOT DETECTED Copies/mL
HIV-1 RNA Quant, Log: NOT DETECTED Log cps/mL

## 2021-01-24 LAB — RPR: RPR Ser Ql: NONREACTIVE

## 2021-01-31 ENCOUNTER — Other Ambulatory Visit: Payer: Self-pay | Admitting: Internal Medicine

## 2021-01-31 DIAGNOSIS — H524 Presbyopia: Secondary | ICD-10-CM | POA: Diagnosis not present

## 2021-01-31 DIAGNOSIS — I10 Essential (primary) hypertension: Secondary | ICD-10-CM

## 2021-01-31 DIAGNOSIS — E119 Type 2 diabetes mellitus without complications: Secondary | ICD-10-CM | POA: Diagnosis not present

## 2021-02-01 ENCOUNTER — Inpatient Hospital Stay: Admission: RE | Admit: 2021-02-01 | Payer: 59 | Source: Ambulatory Visit

## 2021-02-04 ENCOUNTER — Other Ambulatory Visit: Payer: Self-pay | Admitting: Internal Medicine

## 2021-02-04 ENCOUNTER — Ambulatory Visit: Payer: 59

## 2021-02-04 DIAGNOSIS — I10 Essential (primary) hypertension: Secondary | ICD-10-CM

## 2021-02-04 MED FILL — AMLODIPINE BESYLATE 10 MG T: 10 | 90 days supply | Qty: 90 | Fill #0

## 2021-02-06 ENCOUNTER — Other Ambulatory Visit (HOSPITAL_COMMUNITY): Payer: Self-pay | Admitting: Internal Medicine

## 2021-02-06 ENCOUNTER — Ambulatory Visit: Payer: No Typology Code available for payment source | Admitting: Infectious Disease

## 2021-02-06 ENCOUNTER — Ambulatory Visit: Payer: 59 | Attending: Internal Medicine

## 2021-02-06 DIAGNOSIS — Z23 Encounter for immunization: Secondary | ICD-10-CM

## 2021-02-06 MED FILL — DESCOVY 200-25 MG TABS: 200-25 | 30 days supply | Qty: 30 | Fill #10

## 2021-02-06 MED FILL — PIFELTRO 100 MG TABS: 100 | 30 days supply | Qty: 30 | Fill #10

## 2021-02-06 NOTE — Progress Notes (Signed)
   Covid-19 Vaccination Clinic  Name:  Udell Blasingame    MRN: [id number] DOB: 07-15-1961  02/06/2021  Ms. Ellwood was observed post Covid-19 immunization for 15 minutes without incident. She was provided with Vaccine Information Sheet and instruction to access the V-Safe system.   Ms. Mcclish was instructed to call 911 with any severe reactions post vaccine: Marland Kitchen Difficulty breathing  . Swelling of face and throat  . A fast heartbeat  . A bad rash all over body  . Dizziness and weakness   Immunizations Administered    Name Date Dose VIS Date Route   PFIZER Comrnaty(Gray TOP) Covid-19 Vaccine 02/06/2021 10:10 AM 0.3 mL 10/25/2020 Intramuscular   Manufacturer: [employer], [employer]   Lot: [id number]   [place]: [phone]

## 2021-02-07 ENCOUNTER — Ambulatory Visit: Payer: 59

## 2021-02-09 MED FILL — METFORMIN HCL 500 MG TABS: 500 | 30 days supply | Qty: 60 | Fill #1

## 2021-02-12 ENCOUNTER — Ambulatory Visit: Payer: No Typology Code available for payment source | Admitting: Internal Medicine

## 2021-02-15 ENCOUNTER — Other Ambulatory Visit (HOSPITAL_COMMUNITY): Payer: Self-pay

## 2021-02-19 ENCOUNTER — Ambulatory Visit: Payer: 59 | Admitting: Infectious Disease

## 2021-02-20 ENCOUNTER — Encounter: Payer: Self-pay | Admitting: Internal Medicine

## 2021-02-21 ENCOUNTER — Other Ambulatory Visit (HOSPITAL_COMMUNITY): Payer: Self-pay

## 2021-02-27 ENCOUNTER — Other Ambulatory Visit: Payer: Self-pay | Admitting: Infectious Disease

## 2021-02-27 ENCOUNTER — Other Ambulatory Visit (HOSPITAL_COMMUNITY): Payer: Self-pay

## 2021-02-28 ENCOUNTER — Other Ambulatory Visit (HOSPITAL_COMMUNITY): Payer: Self-pay

## 2021-02-28 MED FILL — Doravirine Tab 100 MG: ORAL | 30 days supply | Qty: 30 | Fill #0 | Status: AC

## 2021-02-28 MED FILL — Emtricitabine-Tenofovir Alafenamide Fumarate Tab 200-25 MG: ORAL | 30 days supply | Qty: 30 | Fill #0 | Status: AC

## 2021-03-11 ENCOUNTER — Other Ambulatory Visit (HOSPITAL_COMMUNITY): Payer: Self-pay

## 2021-03-12 ENCOUNTER — Telehealth: Payer: Self-pay | Admitting: *Deleted

## 2021-03-12 ENCOUNTER — Ambulatory Visit (INDEPENDENT_AMBULATORY_CARE_PROVIDER_SITE_OTHER): Payer: 59 | Admitting: Podiatry

## 2021-03-12 ENCOUNTER — Other Ambulatory Visit (HOSPITAL_COMMUNITY): Payer: Self-pay

## 2021-03-12 ENCOUNTER — Other Ambulatory Visit: Payer: Self-pay

## 2021-03-12 DIAGNOSIS — E119 Type 2 diabetes mellitus without complications: Secondary | ICD-10-CM | POA: Diagnosis not present

## 2021-03-12 DIAGNOSIS — B351 Tinea unguium: Secondary | ICD-10-CM

## 2021-03-12 MED ORDER — CICLOPIROX 8 % EX SOLN
Freq: Every day | CUTANEOUS | 4 refills | Status: DC
  Filled 2021-03-12: qty 6.6, 30d supply, fill #0

## 2021-03-12 NOTE — Progress Notes (Signed)
Subjective: 60 year old female presents the office for follow-up evaluation of nail fungus.  She has been using the topical nail fungus intermittently.  She states that the color is getting somewhat better but she still has some ridging within the nails.  Denies any pain of the nails and denies any redness or drainage or any swelling. Denies any systemic complaints such as fevers, chills, nausea, vomiting. No acute changes since last appointment, and no other complaints at this time.   Objective: AAO x3, NAD DP/PT pulses palpable bilaterally, CRT less than 3 seconds Bilateral hallux nails appear to be better in color how there is transverse ridging within the nail.  There is no pain in the nail there is no redness or drainage or any signs of infection with an open lesions. No pain with calf compression, swelling, warmth, erythema  Assessment: 60 year old female with onychomycosis, currently on Penlac  Plan: -All treatment options discussed with the patient including all alternatives, risks, complications.  -Continue with the topical antifungal.  I will see her back in 6 months and at that point if needed discussed possible transition to urea cream to help with the nail damage. -Patient encouraged to call the office with any questions, concerns, change in symptoms.   Trula Slade DPM

## 2021-03-12 NOTE — Telephone Encounter (Signed)
2.5mg  daily- please let her know. Thanks.

## 2021-03-12 NOTE — Telephone Encounter (Signed)
Patient was seen today and during visit , physician mentioned that she start taking Biotin, wanted to know which strength or mg should she purchase? Please advise.

## 2021-03-13 NOTE — Telephone Encounter (Signed)
Returned call to patient and gave information per doctor, verbalized understanding and said thank you.

## 2021-03-15 ENCOUNTER — Encounter: Payer: Self-pay | Admitting: Infectious Disease

## 2021-03-15 ENCOUNTER — Ambulatory Visit (INDEPENDENT_AMBULATORY_CARE_PROVIDER_SITE_OTHER): Payer: 59 | Admitting: Infectious Disease

## 2021-03-15 ENCOUNTER — Other Ambulatory Visit: Payer: Self-pay

## 2021-03-15 VITALS — BP 133/84 | HR 90 | Resp 16 | Ht 64.0 in | Wt 224.4 lb

## 2021-03-15 DIAGNOSIS — I10 Essential (primary) hypertension: Secondary | ICD-10-CM

## 2021-03-15 DIAGNOSIS — B20 Human immunodeficiency virus [HIV] disease: Secondary | ICD-10-CM | POA: Diagnosis not present

## 2021-03-15 DIAGNOSIS — K8500 Idiopathic acute pancreatitis without necrosis or infection: Secondary | ICD-10-CM

## 2021-03-15 DIAGNOSIS — E662 Morbid (severe) obesity with alveolar hypoventilation: Secondary | ICD-10-CM | POA: Diagnosis not present

## 2021-03-15 DIAGNOSIS — Z6839 Body mass index (BMI) 39.0-39.9, adult: Secondary | ICD-10-CM

## 2021-03-15 DIAGNOSIS — K760 Fatty (change of) liver, not elsewhere classified: Secondary | ICD-10-CM

## 2021-03-15 DIAGNOSIS — E1165 Type 2 diabetes mellitus with hyperglycemia: Secondary | ICD-10-CM

## 2021-03-15 NOTE — Progress Notes (Signed)
Subjective:  Complaint she has been struggle with some depressive symptoms after some conflict with her brother who deprived her of her shareof  father's inheritance   Patient ID: Wanda Kane, female    DOB: [birth year], 60 y.o.   MRN: [id number]  HPI  Wanda Kane is a 60 year old black woman living with HIV that is currently perfectly controlled on Pifeltro and DESCOVY.  She had a fairly complicated course previously on a drug that she light quite a bit which was JULUCA.  She may have had pancreatitis to it and when we rechallenged her she had symptoms consistent with pancreatitis.  She did not tolerate SYMTUZA as an option.  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  About newly available antiretrovirals which we discussed including Cabenuva would not be a good option for her since it is essentially similar to being a muscular version of JULUCA.  Been trying to lose weight recently.  She suffered some depressive symptoms when her brother who is acting sounds like as executor of her father's will deprived her of at least $30,000 which were hers to her share of the will.  She had intended to buy a new car with this.  This is caused significant disruption between her and her brother and damage their relationship perhaps irreparably.  I offered her counseling here but she said she did not have a good fit with the counselor since [place] left.    Past Medical History:  Diagnosis Date  . DVT (deep venous thrombosis) ([place]) Jan 2016  . Fibroids 2010  . Genital herpes 08/12/2017  . HIV infection ([place]) 1994  . Hypertension 1994  . Medical history non-contributory   . Microalbuminuria due to type 2 diabetes mellitus ([place]) 11/04/2020  . 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 Pancreatitis 03/09/2019  . Pancreatitis   . Pre-diabetes   . Type 2 diabetes mellitus with hyperglycemia ([place]) 03/03/2019    diagnosed in December 2019  . Vaginal bleeding 12/2014   due to xarelto    Past Surgical History:  Procedure Laterality Date  . CESAREAN SECTION  1983  . CESAREAN SECTION    . TONSILLECTOMY AND ADENOIDECTOMY      Family History  Problem Relation Age of Onset  . Hypertension Mother   . CAD Brother   . Heart disease Brother 69       cardiac arrest      Social History   Socioeconomic History  . Marital status: Single    Spouse name: Not on file  . Number of children: Not on file  . Years of education: college, [place]  . Highest education level: Not on file  Occupational History  . Occupation: Research scientist (life sciences): [employer]  Tobacco Use  . Smoking status: Never Smoker  . Smokeless tobacco: Never Used  Vaping Use  . Vaping Use: Never used  Substance and Sexual Activity  . Alcohol use: No  . Drug use: No  . Sexual activity: Not Currently    Partners: Male    Comment: patient declined condoms  Other Topics Concern  . 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   She loves her job   Has BSW   Working towards Research officer, political party   Social Determinants of Radio broadcast assistant Strain: Not on file  Food Insecurity: Not on file  Transportation Needs: Not on file  Physical Activity: Not on file  Stress: Not on file  Social Connections: Not on file    Allergies  Allergen Reactions  . Prilosec [Omeprazole] Other (See Comments)  . Viread [Tenofovir Disoproxil] Other (See Comments)    Rhabdomyolysis but she has tolerated TDF and TAF since then     Current Outpatient Medications:  .  amLODipine (NORVASC) 10 MG tablet, TAKE 1 TABLET BY MOUTH ONCE DAILY, Disp: 90 tablet, Rfl: 0 .  B-Complex-C TABS, Take 1 tablet by mouth daily., Disp: , Rfl:  .  Blood Glucose Monitoring Suppl (TRUE METRIX METER) w/Device KIT, Check sugar once a day, Disp: 1 kit, Rfl: 0 .  ciclopirox (PENLAC) 8 % solution, Apply topically at bedtime. Apply  over nail and surrounding skin. Apply daily over previous coat. After seven (7) days, may remove with alcohol and continue cycle., Disp: 6.6 mL, Rfl: 4 .  COVID-19 mRNA Vac-TriS, Pfizer, SUSP injection, USE AS DIRECTED, Disp: .3 mL, Rfl: 0 .  emtricitabine-tenofovir AF (DESCOVY) 200-25 MG tablet, TAKE 1 TABLET BY MOUTH DAILY., Disp: 30 tablet, Rfl: 11 .  glucose blood (TRUE METRIX BLOOD GLUCOSE TEST) test strip, Check sugar once a day, Disp: 100 each, Rfl: 12 .  lisinopril (ZESTRIL) 10 MG tablet, TAKE 1&1/2 TABLETS BY MOUTH ONCE A DAY, Disp: 135 tablet, Rfl: 1 .  metFORMIN (GLUCOPHAGE) 500 MG tablet, TAKE 1/2 TABLET BY MOUTH DAILY, Disp: 15 tablet, Rfl: 2 .  Multiple Vitamin (MULTIVITAMIN) capsule, Take 1 capsule by mouth daily., Disp: , Rfl:  .  NON FORMULARY, Vitamin D, pumpkin seed oil - use as directed, Disp: , Rfl:  .  doravirine (PIFELTRO) 100 MG TABS tablet, TAKE 1 TABLET (100 MG TOTAL) BY MOUTH DAILY., Disp: 30 tablet, Rfl: 11 .  spironolactone (ALDACTONE) 100 MG tablet, TAKE 1 TABLET BY MOUTH ONCE A DAY, Disp: 90 tablet, Rfl: 0 .  sulfamethoxazole-trimethoprim (BACTRIM DS) 800-160 MG tablet, TAKE 1 TABLET BY MOUTH 2 (TWO) TIMES DAILY., Disp: 14 tablet, Rfl: 0 .  TRUEPLUS LANCETS 28G MISC, Check sugar once a day, Disp: 100 each, Rfl: 12    Review of Systems  Constitutional: Negative for activity change, appetite change, chills, diaphoresis, fatigue, fever and unexpected weight change.  HENT: Negative for congestion, rhinorrhea, sinus pressure, sneezing, sore throat and trouble swallowing.   Eyes: Negative for photophobia and visual disturbance.  Respiratory: Negative for cough, chest tightness, shortness of breath, wheezing and stridor.   Cardiovascular: Negative for chest pain, palpitations and leg swelling.  Gastrointestinal: Negative for abdominal distention, abdominal pain, anal bleeding, blood in stool, constipation, diarrhea, nausea and vomiting.  Genitourinary: Negative for  difficulty urinating, dysuria, flank pain and hematuria.  Musculoskeletal: Negative for arthralgias, back pain, gait problem, joint swelling and myalgias.  Skin: Negative for color change, pallor, rash and wound.  Neurological: Negative for dizziness, tremors, weakness and light-headedness.  Hematological: Negative for adenopathy. Does not bruise/bleed easily.  Psychiatric/Behavioral: Positive for dysphoric mood. Negative for agitation, behavioral problems, confusion, decreased concentration, hallucinations and sleep disturbance. The patient is not nervous/anxious and is not hyperactive.        Objective:   Physical Exam Constitutional:      General: She is not in acute distress.    Appearance: She is not diaphoretic.  HENT:     Head: Normocephalic and atraumatic.     Right Ear: External ear normal.     Left Ear: External ear normal.  Nose: Nose normal.     Mouth/Throat:     Pharynx: No oropharyngeal exudate.  Eyes:     General: No scleral icterus.    Conjunctiva/sclera: Conjunctivae normal.     Pupils: Pupils are equal, round, and reactive to light.  Cardiovascular:     Rate and Rhythm: Normal rate and regular rhythm.     Heart sounds: Normal heart sounds. No murmur heard. No friction rub. No gallop.   Pulmonary:     Effort: Pulmonary effort is normal. No respiratory distress.     Breath sounds: Normal breath sounds. No wheezing or rales.  Abdominal:     General: Bowel sounds are normal. There is no distension.     Palpations: Abdomen is soft.     Tenderness: There is no abdominal tenderness. There is no rebound.  Musculoskeletal:        General: No tenderness. Normal range of motion.     Cervical back: Normal range of motion and neck supple.  Lymphadenopathy:     Cervical: No cervical adenopathy.  Skin:    General: Skin is warm and dry.     Coloration: Skin is not pale.     Findings: No erythema or rash.  Neurological:     General: No focal deficit present.      Mental Status: She is alert and oriented to person, place, and time.     Coordination: Coordination normal.  Psychiatric:        Mood and Affect: Mood normal.        Behavior: Behavior normal.        Thought Content: Thought content normal.        Judgment: Judgment normal.           Assessment & Plan:   HIV disease:   Continue Pifeltro and DESCOVY and return to clinic in 1 year.  Hypertension  continue current meds  Depression: Offered counseling and it and antidepressant therapy she will explore options if she has outside the clinic for therapy.  Pancreatitis  resolved.  Obesity: Working on weight loss.  Off of an integrase inhibitor now  I spent greater than 40 minutes with the patient including greater than 50% of time in face to face counsel of the patient and her HIV regarding different antiretroviral regimens that are coming in the pipeline which ones would be suitable for her which ones would not be, regarding her consult with her brother providing supportive therapy and in coordination of her care.

## 2021-03-21 ENCOUNTER — Other Ambulatory Visit: Payer: Self-pay

## 2021-03-21 ENCOUNTER — Ambulatory Visit
Admission: RE | Admit: 2021-03-21 | Discharge: 2021-03-21 | Disposition: A | Discharge status: Home / Self Care | Payer: 59 | Source: Ambulatory Visit | Attending: Internal Medicine | Admitting: Internal Medicine

## 2021-03-21 DIAGNOSIS — Z1231 Encounter for screening mammogram for malignant neoplasm of breast: Secondary | ICD-10-CM | POA: Diagnosis not present

## 2021-03-27 ENCOUNTER — Other Ambulatory Visit (HOSPITAL_COMMUNITY): Payer: Self-pay

## 2021-03-28 ENCOUNTER — Other Ambulatory Visit (HOSPITAL_COMMUNITY): Payer: Self-pay

## 2021-03-28 MED FILL — Doravirine Tab 100 MG: ORAL | 30 days supply | Qty: 30 | Fill #1 | Status: AC

## 2021-03-28 MED FILL — Emtricitabine-Tenofovir Alafenamide Fumarate Tab 200-25 MG: ORAL | 30 days supply | Qty: 30 | Fill #1 | Status: AC

## 2021-03-29 ENCOUNTER — Other Ambulatory Visit (HOSPITAL_COMMUNITY): Payer: Self-pay

## 2021-04-03 ENCOUNTER — Other Ambulatory Visit (HOSPITAL_COMMUNITY): Payer: Self-pay

## 2021-04-04 ENCOUNTER — Encounter: Payer: Self-pay | Admitting: Internal Medicine

## 2021-04-04 ENCOUNTER — Other Ambulatory Visit: Payer: Self-pay

## 2021-04-04 ENCOUNTER — Ambulatory Visit: Payer: 59 | Attending: Internal Medicine | Admitting: Internal Medicine

## 2021-04-04 ENCOUNTER — Other Ambulatory Visit (HOSPITAL_COMMUNITY): Payer: Self-pay

## 2021-04-04 VITALS — Wt 221.0 lb

## 2021-04-04 DIAGNOSIS — E785 Hyperlipidemia, unspecified: Secondary | ICD-10-CM | POA: Diagnosis not present

## 2021-04-04 DIAGNOSIS — E1169 Type 2 diabetes mellitus with other specified complication: Secondary | ICD-10-CM

## 2021-04-04 DIAGNOSIS — I10 Essential (primary) hypertension: Secondary | ICD-10-CM

## 2021-04-04 DIAGNOSIS — E669 Obesity, unspecified: Secondary | ICD-10-CM | POA: Diagnosis not present

## 2021-04-04 DIAGNOSIS — Z1211 Encounter for screening for malignant neoplasm of colon: Secondary | ICD-10-CM | POA: Diagnosis not present

## 2021-04-04 DIAGNOSIS — R87820 Cervical low risk human papillomavirus (HPV) DNA test positive: Secondary | ICD-10-CM

## 2021-04-04 NOTE — Progress Notes (Signed)
Virtual Visit via Telephone Note  I connected with Wanda Kane on 04/04/2021 at 11:09 a.m by telephone and verified that I am speaking with the correct person using two identifiers  Location: Patient: home Provider: office  Participants: Myself Patient CMA: Ms. Maryclare Bean interpreter:   I discussed the limitations, risks, security and privacy concerns of performing an evaluation and management service by telephone and the availability of in person appointments. I also discussed with the patient that there may be a patient responsible charge related to this service. The patient expressed understanding and agreed to proceed.   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 Last seen 11/2020.  DM: Patient reports she is doing well.  She continues to take the low-dose of metformin.  Blood sugars range 140-150.  She is started trying to incorporate bread back into her diet but feels it is not going to work.  Eating more meats and vegetables.  Weight is down 5 pounds since her last visit with me in January of this year.  She is moving more.  Plans to start going to the gym now that the weather is heating up.  HTN: Reports blood pressure readings have been good.  Recent reading at work was 128/75.  Compliant with Norvasc, lisinopril and spironolactone.  She limits salt in the foods.  HL: Recent lipid profile done by Dr. Drucilla Schmidt reveals her total, LDL and triglyceride levels have increased.  Patient still not interested in being on statins.  HM: She still has not had a colonoscopy done as yet.  She had positive Cologuard test done a few years ago.  She has held off on doing the colonoscopy because she has a co-pay of $500.  Last Pap smear was done in 2020.  HPV positive but subsequent subtypes were negative.  I had referred her to  GYN because previous Pap smear in 2015 was also HPV positive.  However patient never received the appointment.  Message was placed in the chart that she needs to go through the [facility] program    Outpatient Encounter Medications as of 04/04/2021  Medication Sig  . amLODipine (NORVASC) 10 MG tablet TAKE 1 TABLET BY MOUTH ONCE Kane  . B-Complex-C TABS Take 1 tablet by mouth Kane.  . Blood Glucose Monitoring Suppl (TRUE METRIX METER) w/Device KIT Check sugar once a day  . ciclopirox (PENLAC) 8 % solution Apply topically at bedtime. Apply over nail and surrounding skin. Apply Kane over previous coat. After seven (7) days, may remove with alcohol and continue cycle.  Marland Kitchen COVID-19 mRNA Vac-TriS, Pfizer, SUSP injection USE AS DIRECTED  . doravirine (PIFELTRO) 100 MG TABS tablet TAKE 1 TABLET (100 MG TOTAL) BY MOUTH Kane.  Marland Kitchen emtricitabine-tenofovir AF (DESCOVY) 200-25 MG tablet TAKE 1 TABLET BY MOUTH Kane.  Marland Kitchen glucose blood (TRUE METRIX BLOOD GLUCOSE TEST) test strip Check sugar once a day  . lisinopril (ZESTRIL) 10 MG tablet TAKE 1&1/2 TABLETS BY MOUTH ONCE A DAY  . metFORMIN (GLUCOPHAGE) 500 MG tablet TAKE 1/2 TABLET BY MOUTH Kane  . Multiple Vitamin (MULTIVITAMIN) capsule Take 1 capsule by mouth Kane.  . NON FORMULARY Vitamin D, pumpkin seed oil - use as directed  . spironolactone (ALDACTONE) 100 MG tablet TAKE 1 TABLET BY MOUTH ONCE A DAY  . sulfamethoxazole-trimethoprim (BACTRIM DS) 800-160 MG tablet TAKE 1 TABLET BY MOUTH 2 (TWO)  TIMES Kane.  Marland Kitchen TRUEPLUS LANCETS 28G MISC Check sugar once a day   No facility-administered encounter medications on file as of 04/04/2021.      Observations/Objective: Lab Results  Component Value Date   WBC 10.4 01/21/2021   HGB 14.2 01/21/2021   HCT 41.6 01/21/2021   MCV 91.8 01/21/2021   PLT 310 01/21/2021     Chemistry      Component Value Date/Time   NA 140 01/21/2021 0937   NA 144 11/16/2018 0956   K 4.1 01/21/2021 0937   CL 104 01/21/2021 0937    CO2 26 01/21/2021 0937   BUN 21 01/21/2021 0937   BUN 11 11/16/2018 0956   CREATININE 0.76 01/21/2021 0937      Component Value Date/Time   CALCIUM 10.1 01/21/2021 0937   ALKPHOS 92 03/11/2019 0636   AST 20 01/21/2021 0937   ALT 17 01/21/2021 0937   BILITOT 0.5 01/21/2021 0937   BILITOT 0.5 11/16/2018 0956     Lab Results  Component Value Date   CHOL 258 (H) 01/21/2021   HDL 53 01/21/2021   LDLCALC 170 (H) 01/21/2021   TRIG 195 (H) 01/21/2021   CHOLHDL 4.9 01/21/2021     Assessment and Plan: 1. Type 2 diabetes mellitus with obesity (HCC) Blood sugars are not too bad.  She will continue metformin, healthy eating habits and trying to move as much as she can.  She wants to refer having an A1c done until her next visit.  2. Essential hypertension At goal.  Continue current medications and low-salt diet  3. Hyperlipidemia associated with type 2 diabetes mellitus ([place]) Ideally should be on statin therapy but patient declines.  She prefers to work on this through lifestyle changes.  Can explore referring her for PSCK9 agent through cardiology on next in person visit.  4. 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 Screening for colon cancer Patient plans to work on trying to get funds together so that she can get her colonoscopy done.   Follow Up Instructions: 3 mths   I discussed the assessment and treatment plan with the patient. The patient was provided an opportunity to ask questions and all were answered. The patient agreed with the plan and demonstrated an understanding of the instructions.   The patient was advised to call back or seek an in-person evaluation if the symptoms worsen or if the condition fails to improve as anticipated.  I  Spent 18 minutes on this telephone encounter  Karle Plumber, MD

## 2021-04-06 ENCOUNTER — Other Ambulatory Visit (HOSPITAL_COMMUNITY): Payer: Self-pay

## 2021-04-08 ENCOUNTER — Other Ambulatory Visit (HOSPITAL_COMMUNITY): Payer: Self-pay

## 2021-04-19 ENCOUNTER — Ambulatory Visit: Payer: 59 | Admitting: Infectious Diseases

## 2021-04-23 ENCOUNTER — Other Ambulatory Visit (HOSPITAL_COMMUNITY)
Admission: RE | Admit: 2021-04-23 | Discharge: 2021-04-23 | Disposition: A | Discharge status: Home / Self Care | Payer: 59 | Source: Ambulatory Visit | Attending: Infectious Diseases | Admitting: Infectious Diseases

## 2021-04-23 ENCOUNTER — Other Ambulatory Visit: Payer: Self-pay

## 2021-04-23 ENCOUNTER — Encounter: Payer: Self-pay | Admitting: Infectious Diseases

## 2021-04-23 ENCOUNTER — Ambulatory Visit (INDEPENDENT_AMBULATORY_CARE_PROVIDER_SITE_OTHER): Payer: 59 | Admitting: Infectious Diseases

## 2021-04-23 VITALS — BP 134/85 | HR 99 | Temp 98.4°F | Wt 225.0 lb

## 2021-04-23 DIAGNOSIS — R87810 Cervical high risk human papillomavirus (HPV) DNA test positive: Secondary | ICD-10-CM | POA: Diagnosis not present

## 2021-04-23 DIAGNOSIS — Z124 Encounter for screening for malignant neoplasm of cervix: Secondary | ICD-10-CM | POA: Diagnosis not present

## 2021-04-23 DIAGNOSIS — R32 Unspecified urinary incontinence: Secondary | ICD-10-CM | POA: Diagnosis not present

## 2021-04-23 NOTE — Assessment & Plan Note (Signed)
Appears to have mild cystocele on exam with some urinary symptoms present mostly in the form of dribbling / leakage. She can gain control over the leakage at times but noticed that since gaining some weight this has worsened this condition. She had 1 child via c-section but labored > 30 hours prior to delivery.  We talked about consideration for pelvic floor physiotherapy - I gave her information for [facility]'s team for her review so she can discuss further with her PCP for referral.

## 2021-04-23 NOTE — Patient Instructions (Signed)
It looks like your bladder is falling a little bit which could be contribitugn to some leakage.   I had great success with pelvic floor physical therapy. If you want to look at their website and services : [email or web address]  If you need a referral for this you will need to coordinate through Dr. Wynetta Emery for your insurance to cover it so reach out to discuss with her.

## 2021-04-23 NOTE — Assessment & Plan Note (Signed)
Cytology from 2020 pap smear revealed ASCUS and high risk HPV present. Will repeat today and refer to GYN for follow up if needed.  Discussed HPV and relationship with cervical cancer.  If no referral needed will plan to repeat pap in 1 year.

## 2021-04-23 NOTE — Progress Notes (Signed)
Subjective :    Wanda Kane is a 60 y.o. female here for pelvic exam and pap smear for routine cervical cancer screening    Review of Systems: Current GYN complaints or concerns: has a history of HPV on more recent pap screen done by her PCP (2018). She wanted to repeat this today and help with next steps for management if still present.   Patient denies any abdominal/pelvic pain, problems with bowel movements, urination, vaginal discharge or intercourse.  She does have some problems with urinary leakage that has been worse since gaining some weight.   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     Past Medical History:  Diagnosis Date  . DVT (deep venous thrombosis) ([place]) Jan 2016  . Fibroids 2010  . Genital herpes 08/12/2017  . HIV infection ([place]) 1994  . Hypertension 1994  . Medical history non-contributory   . Microalbuminuria due to type 2 diabetes mellitus ([place]) 11/04/2020  . 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 Pancreatitis 03/09/2019  . Pancreatitis   . Pre-diabetes   . Type 2 diabetes mellitus with hyperglycemia ([place]) 03/03/2019   diagnosed in December 2019  . Vaginal bleeding 12/2014   due to xarelto    Gynecologic History: G1P1001  Patient's last menstrual period was 05/31/2016 (approximate). Contraception: post menopausal status Last Pap: 2020. Results were: ASCUS cytology, + HPV Last Mammogram: 03/21/2021. Results were: normal    Objective :   Physical Exam - chaperone present  Constitutional: Well developed, well nourished, no acute distress. She is alert and oriented x3.  Pelvic: External genitalia is normal in appearance. The vagina is normal in appearance. The cervix is bulbous and easily visualized. No CMT, normal expected cervical mucus present. Mild grade I cystocele present. Psych: She has a normal mood and affect.      Assessment & Plan:    Patient Active Problem List   Diagnosis Date Noted  . Urinary leakage 04/23/2021  .  Microalbuminuria due to type 2 diabetes mellitus ([place]) 11/04/2020  . Statin declined 11/02/2020  . Fatty liver 03/28/2019  . Hypokalemia 03/11/2019  . Pancreatitis 03/09/2019  . 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 History of DVT (deep vein thrombosis) 03/09/2019  . Presence of IVC filter 03/09/2019  . Leukocytosis 03/09/2019  . Type 2 diabetes mellitus with hyperglycemia ([place]) 03/03/2019  . New onset type 2 diabetes mellitus ([place]) 12/21/2018  . Class 2 severe obesity with serious comorbidity and body mass index (BMI) of 39.0 to 39.9 in adult ([place]) 12/21/2018  . Hyperlipemia, mixed 12/21/2018  . Nail fungus 01/01/2018  . Genital herpes 08/12/2017  . Positive fecal immunochemical test 06/29/2017  . Pap smear of cervix shows high risk HPV present 06/22/2017  . HIV disease ([place]) 11/22/2015  . Hypertension 12/29/2014  . Metabolic syndrome 88/41/6606  . Cutaneous skin tags 10/26/2014  . Anemia 10/12/2013  . Myalgia 02/09/2013  . Condyloma acuminatum 10/27/2012  . Acquired acanthosis nigricans 10/27/2012  . Uterine fibroid 10/27/2012  . Hyperlipidemia associated with type 2 diabetes mellitus ([place]) 10/18/2012    Problem List Items Addressed This Visit      Unprioritized   Urinary leakage    Appears to have mild cystocele on exam with some urinary symptoms present mostly in the form of dribbling / leakage. She can gain control over the leakage at times but noticed that since gaining some weight this has worsened this condition. She had 1 child via c-section but labored > 30 hours  prior to delivery.  We talked about consideration for pelvic floor physiotherapy - I gave her information for [facility]'s team for her review so she can discuss further with her PCP for referral.       Pap smear of cervix shows high risk HPV present ([place])    Cytology from 2020 pap smear revealed ASCUS and high risk HPV present. Will repeat today and refer to GYN for follow up if needed.  Discussed HPV and  relationship with cervical cancer.  If no referral needed will plan to repeat pap in 1 year.        Other Visit Diagnoses    Screening for cervical cancer    -  Primary   Relevant Orders   Cytology - PAP( [place])        Janene Madeira, MSN, NP-C [facility] [place] Office: [phone] Pager: [phone]  04/23/21 12:19 PM

## 2021-04-24 ENCOUNTER — Other Ambulatory Visit: Payer: Self-pay | Admitting: Internal Medicine

## 2021-04-24 ENCOUNTER — Other Ambulatory Visit (HOSPITAL_COMMUNITY): Payer: Self-pay

## 2021-04-24 ENCOUNTER — Other Ambulatory Visit: Payer: Self-pay | Admitting: Pharmacist

## 2021-04-24 DIAGNOSIS — I10 Essential (primary) hypertension: Secondary | ICD-10-CM

## 2021-04-24 DIAGNOSIS — B20 Human immunodeficiency virus [HIV] disease: Secondary | ICD-10-CM

## 2021-04-24 MED ORDER — AMLODIPINE BESYLATE 10 MG PO TABS
ORAL_TABLET | Freq: Every day | ORAL | 0 refills | Status: DC
Start: 2021-04-24 — End: 2021-09-17
  Filled 2021-04-24: qty 90, 90d supply, fill #0

## 2021-04-24 MED ORDER — SPIRONOLACTONE 100 MG PO TABS
ORAL_TABLET | Freq: Every day | ORAL | 0 refills | Status: DC
  Filled 2021-04-24: qty 90, 90d supply, fill #0

## 2021-04-24 MED ORDER — DORAVIRINE 100 MG PO TABS
100.0000 mg | ORAL_TABLET | Freq: Every day | ORAL | 11 refills | Status: DC
  Filled 2021-04-24 (×2): qty 30, 30d supply, fill #0
  Filled 2021-05-22: qty 30, 30d supply, fill #1
  Filled 2021-07-03: qty 30, 30d supply, fill #2
  Filled 2021-07-31: qty 30, 30d supply, fill #3
  Filled 2021-08-30: qty 30, 30d supply, fill #4
  Filled 2021-09-30: qty 30, 30d supply, fill #5
  Filled 2021-10-30: qty 30, 30d supply, fill #6
  Filled 2021-11-28: qty 30, 30d supply, fill #7
  Filled 2021-12-31: qty 30, 30d supply, fill #8
  Filled 2022-01-27: qty 30, 30d supply, fill #9
  Filled 2022-02-26: qty 30, 30d supply, fill #10
  Filled 2022-03-24: qty 30, 30d supply, fill #11

## 2021-04-24 MED ORDER — DESCOVY 200-25 MG PO TABS
1.0000 | ORAL_TABLET | Freq: Every day | ORAL | 11 refills | Status: DC
  Filled 2021-04-24 (×2): qty 30, 30d supply, fill #0
  Filled 2021-05-22: qty 30, 30d supply, fill #1
  Filled 2021-07-03: qty 30, 30d supply, fill #2
  Filled 2021-07-31: qty 30, 30d supply, fill #3
  Filled 2021-08-30: qty 30, 30d supply, fill #4
  Filled 2021-09-30: qty 30, 30d supply, fill #5
  Filled 2021-10-30: qty 30, 30d supply, fill #6
  Filled 2021-11-28: qty 30, 30d supply, fill #7
  Filled 2021-12-31: qty 30, 30d supply, fill #8
  Filled 2022-01-27: qty 30, 30d supply, fill #9
  Filled 2022-02-26: qty 30, 30d supply, fill #10
  Filled 2022-03-24: qty 30, 30d supply, fill #11

## 2021-04-24 MED FILL — Lisinopril Tab 10 MG: ORAL | 90 days supply | Qty: 135 | Fill #0 | Status: AC

## 2021-04-26 ENCOUNTER — Other Ambulatory Visit (HOSPITAL_COMMUNITY): Payer: Self-pay

## 2021-04-26 LAB — CYTOLOGY - PAP
Adequacy: ABSENT
Chlamydia: NEGATIVE
Comment: NEGATIVE
Comment: NEGATIVE
Comment: NEGATIVE
Comment: NORMAL
Diagnosis: NEGATIVE
HPV 16: NEGATIVE
HPV 18 / 45: NEGATIVE
High risk HPV: POSITIVE — AB
Neisseria Gonorrhea: NEGATIVE

## 2021-04-29 ENCOUNTER — Other Ambulatory Visit: Payer: Self-pay | Admitting: Infectious Diseases

## 2021-04-29 DIAGNOSIS — R32 Unspecified urinary incontinence: Secondary | ICD-10-CM

## 2021-04-29 NOTE — Progress Notes (Signed)
Patient contacted me back requesting to have consultation for pelvic floor physiotherapy to help with urine incontinence as was discussed at her recent pap smear/pelvic exam.   Referral placed.

## 2021-05-02 ENCOUNTER — Other Ambulatory Visit (HOSPITAL_COMMUNITY): Payer: Self-pay

## 2021-05-22 ENCOUNTER — Other Ambulatory Visit (HOSPITAL_COMMUNITY): Payer: Self-pay

## 2021-05-28 ENCOUNTER — Ambulatory Visit: Payer: 59 | Admitting: Podiatry

## 2021-05-30 ENCOUNTER — Other Ambulatory Visit (HOSPITAL_COMMUNITY): Payer: Self-pay

## 2021-06-26 ENCOUNTER — Other Ambulatory Visit (HOSPITAL_COMMUNITY): Payer: Self-pay

## 2021-07-03 ENCOUNTER — Other Ambulatory Visit (HOSPITAL_COMMUNITY): Payer: Self-pay

## 2021-07-23 DIAGNOSIS — Z760 Encounter for issue of repeat prescription: Secondary | ICD-10-CM | POA: Diagnosis not present

## 2021-07-31 ENCOUNTER — Other Ambulatory Visit (HOSPITAL_COMMUNITY): Payer: Self-pay

## 2021-08-06 ENCOUNTER — Other Ambulatory Visit (HOSPITAL_COMMUNITY): Payer: Self-pay

## 2021-08-06 ENCOUNTER — Ambulatory Visit: Payer: 59 | Admitting: Internal Medicine

## 2021-08-30 ENCOUNTER — Other Ambulatory Visit (HOSPITAL_COMMUNITY): Payer: Self-pay

## 2021-09-02 ENCOUNTER — Ambulatory Visit: Payer: 59 | Admitting: Internal Medicine

## 2021-09-05 ENCOUNTER — Other Ambulatory Visit (HOSPITAL_COMMUNITY): Payer: Self-pay

## 2021-09-12 ENCOUNTER — Ambulatory Visit: Payer: 59 | Admitting: Podiatry

## 2021-09-17 ENCOUNTER — Other Ambulatory Visit (HOSPITAL_COMMUNITY): Payer: Self-pay

## 2021-09-17 ENCOUNTER — Other Ambulatory Visit: Payer: Self-pay | Admitting: Internal Medicine

## 2021-09-17 DIAGNOSIS — I10 Essential (primary) hypertension: Secondary | ICD-10-CM

## 2021-09-17 MED ORDER — AMLODIPINE BESYLATE 10 MG PO TABS
ORAL_TABLET | Freq: Every day | ORAL | 0 refills | Status: DC
  Filled 2021-09-17: qty 90, 90d supply, fill #0

## 2021-09-17 MED ORDER — LISINOPRIL 10 MG PO TABS
ORAL_TABLET | ORAL | 0 refills | Status: DC
  Filled 2021-09-17: qty 135, 90d supply, fill #0

## 2021-09-17 MED ORDER — SPIRONOLACTONE 100 MG PO TABS
ORAL_TABLET | Freq: Every day | ORAL | 0 refills | Status: DC
  Filled 2021-09-17: qty 90, 90d supply, fill #0

## 2021-09-17 NOTE — Telephone Encounter (Signed)
Requested Prescriptions  Pending Prescriptions Disp Refills  . lisinopril (ZESTRIL) 10 MG tablet 135 tablet 0    Sig: TAKE 1&1/2 TABLETS BY MOUTH ONCE A DAY     Cardiovascular:  ACE Inhibitors Failed - 09/17/2021  9:20 AM      Failed - Cr in normal range and within 180 days    Creat  Date Value Ref Range Status  01/21/2021 0.76 0.50 - 0.99 mg/dL Final    Comment:    For patients >60 years of age, the reference limit for Creatinine is approximately 13% higher for people identified as African-American. .          Failed - K in normal range and within 180 days    Potassium  Date Value Ref Range Status  01/21/2021 4.1 3.5 - 5.3 mmol/L Final         Passed - Patient is not pregnant      Passed - Last BP in normal range    BP Readings from Last 1 Encounters:  04/23/21 134/85         Passed - Valid encounter within last 6 months    Recent Outpatient Visits          5 months ago Type 2 diabetes mellitus with obesity ([place])   [place] [name], MD   9 months ago Abscess, axilla   [place], MD   9 months ago Type 2 diabetes mellitus with morbid obesity [facility])   [place], Annie Main L, RPH-CPP   10 months ago Type 2 diabetes mellitus with morbid obesity [facility])   [place], Deborah B, MD   1 year ago Type 2 diabetes mellitus without complication, without long-term current use of insulin [facility])   [place], MD      Future Appointments            In 1 month Wynetta Emery, Dalbert Batman, MD [place]           . amLODipine (NORVASC) 10 MG tablet 90 tablet 0    Sig: TAKE 1 TABLET BY MOUTH ONCE DAILY     Cardiovascular:  Calcium Channel Blockers Passed - 09/17/2021  9:20 AM      Passed - Last BP in normal range    BP Readings from  Last 1 Encounters:  04/23/21 134/85         Passed - Valid encounter within last 6 months    Recent Outpatient Visits          5 months ago Type 2 diabetes mellitus with obesity ([place])   [place] [name], MD   9 months ago Abscess, axilla   [place] Karle Plumber B, MD   9 months ago Type 2 diabetes mellitus with morbid obesity [facility])   [place], Annie Main L, RPH-CPP   10 months ago Type 2 diabetes mellitus with morbid obesity [facility])   [place] Karle Plumber B, MD   1 year ago Type 2 diabetes mellitus without complication, without long-term current use of insulin [facility])   [place], Deborah B, MD      Future Appointments  In 1 month [name], MD [place]           . spironolactone (ALDACTONE) 100 MG tablet 90 tablet 0    Sig: TAKE 1 TABLET BY MOUTH ONCE A DAY     Cardiovascular: Diuretics - Aldosterone Antagonist Passed - 09/17/2021  9:20 AM      Passed - Cr in normal range and within 360 days    Creat  Date Value Ref Range Status  01/21/2021 0.76 0.50 - 0.99 mg/dL Final    Comment:    For patients >49 years of age, the reference limit for Creatinine is approximately 13% higher for people identified as African-American. .          Passed - K in normal range and within 360 days    Potassium  Date Value Ref Range Status  01/21/2021 4.1 3.5 - 5.3 mmol/L Final         Passed - Na in normal range and within 360 days    Sodium  Date Value Ref Range Status  01/21/2021 140 135 - 146 mmol/L Final  11/16/2018 144 134 - 144 mmol/L Final         Passed - Last BP in normal range    BP Readings from Last 1 Encounters:  04/23/21 134/85         Passed - Valid encounter within last 6 months    Recent Outpatient Visits           5 months ago Type 2 diabetes mellitus with obesity ([place])   [place] [name], MD   9 months ago Abscess, axilla   [place] Karle Plumber B, MD   9 months ago Type 2 diabetes mellitus with morbid obesity [facility])   [place], Annie Main L, RPH-CPP   10 months ago Type 2 diabetes mellitus with morbid obesity [facility])   [place], Deborah B, MD   1 year ago Type 2 diabetes mellitus without complication, without long-term current use of insulin [facility])   [place], MD      Future Appointments            In 1 month Wynetta Emery, Dalbert Batman, MD [place]

## 2021-09-30 ENCOUNTER — Other Ambulatory Visit (HOSPITAL_COMMUNITY): Payer: Self-pay

## 2021-10-04 ENCOUNTER — Other Ambulatory Visit (HOSPITAL_COMMUNITY): Payer: Self-pay

## 2021-10-24 ENCOUNTER — Ambulatory Visit: Payer: 59 | Admitting: Internal Medicine

## 2021-10-30 ENCOUNTER — Other Ambulatory Visit (HOSPITAL_COMMUNITY): Payer: Self-pay

## 2021-11-05 ENCOUNTER — Other Ambulatory Visit (HOSPITAL_COMMUNITY): Payer: Self-pay

## 2021-11-06 ENCOUNTER — Other Ambulatory Visit (HOSPITAL_COMMUNITY): Payer: Self-pay

## 2021-11-06 ENCOUNTER — Ambulatory Visit (HOSPITAL_COMMUNITY): Payer: 59

## 2021-11-06 ENCOUNTER — Ambulatory Visit (HOSPITAL_COMMUNITY): Payer: Self-pay

## 2021-11-07 ENCOUNTER — Encounter (HOSPITAL_COMMUNITY): Payer: Self-pay | Admitting: *Deleted

## 2021-11-07 ENCOUNTER — Ambulatory Visit (HOSPITAL_COMMUNITY)
Admission: EM | Admit: 2021-11-07 | Discharge: 2021-11-07 | Disposition: A | Discharge status: Home / Self Care | Payer: 59 | Attending: Emergency Medicine | Admitting: Emergency Medicine

## 2021-11-07 ENCOUNTER — Other Ambulatory Visit: Payer: Self-pay

## 2021-11-07 ENCOUNTER — Other Ambulatory Visit (HOSPITAL_COMMUNITY): Payer: Self-pay

## 2021-11-07 DIAGNOSIS — R1031 Right lower quadrant pain: Secondary | ICD-10-CM

## 2021-11-07 MED ORDER — ALUMINUM-MAGNESIUM-SIMETHICONE 200-200-20 MG/5ML PO SUSP
30.0000 mL | Freq: Three times a day (TID) | ORAL | 0 refills | Status: DC
  Filled 2021-11-07: qty 1680, 14d supply, fill #0

## 2021-11-07 NOTE — ED Provider Notes (Signed)
[place]    CSN: [id number] Arrival date & time: 11/07/21  0802      History   Chief Complaint Chief Complaint  Patient presents with   Abdominal Pain    HPI Wanda Kane is a 60 y.o. female.   Patient presents with right-sided lower abdominal pain that radiates into her back for 7 days.  Symptoms are occurring intermittently.  Endorses that she googled her symptoms and suspected that she possibly had gallstones in which she treated with bile stones.  After use of bowel stones endorses that she had an allergic reaction causes a rash directly over the right lower quadrant, increased bloating and decreased appetite.  Endorses changes in bowel movements, typically occurs once a day, now occurring every 1 to 2 days.  Patient endorses today that all symptoms have resolved.  Denies urinary changes, fever, chills, nausea, vomiting, heartburn, indigestion, increased gas production.  History of HIV, hypertension, pancreatitis, diabetes.   Past Medical History:  Diagnosis Date   DVT (deep venous thrombosis) ([place]) Jan 2016   Fibroids 2010   Genital herpes 08/12/2017   HIV infection ([place]) 1994   Hypertension 1994   Medical history non-contributory    Microalbuminuria due to type 2 diabetes mellitus ([place]) 11/04/2020   Myositis 03/09/2019   Pancreatitis 03/09/2019   Pancreatitis    Pre-diabetes    Type 2 diabetes mellitus with hyperglycemia ([place]) 03/03/2019   diagnosed in December 2019   Vaginal bleeding 12/2014   due to [place]    Patient Active Problem List   Diagnosis Date Noted   Urinary leakage 04/23/2021   Microalbuminuria due to type 2 diabetes mellitus ([place]) 11/04/2020   Statin declined 11/02/2020   Fatty liver 03/28/2019   Hypokalemia 03/11/2019   Pancreatitis 03/09/2019   Myositis 03/09/2019   History of DVT (deep vein thrombosis) 03/09/2019   Presence of IVC filter 03/09/2019   Leukocytosis 03/09/2019   Type 2 diabetes mellitus with hyperglycemia  ([place]) 03/03/2019   New onset type 2 diabetes mellitus ([place]) 12/21/2018   Class 2 severe obesity with serious comorbidity and body mass index (BMI) of 39.0 to 39.9 in adult ([place]) 12/21/2018   Hyperlipemia, mixed 12/21/2018   Nail fungus 01/01/2018   Genital herpes 08/12/2017   Positive fecal immunochemical test 06/29/2017   Pap smear of cervix shows high risk HPV present 06/22/2017   HIV disease ([place]) 11/22/2015   Hypertension 22/48/2500   Metabolic syndrome 37/02/8888   Cutaneous skin tags 10/26/2014   Anemia 10/12/2013   Myalgia 02/09/2013   Condyloma acuminatum 10/27/2012   Acquired acanthosis nigricans 10/27/2012   Uterine fibroid 10/27/2012   Hyperlipidemia associated with type 2 diabetes mellitus ([place]) 10/18/2012    Past Surgical History:  Procedure Laterality Date   CESAREAN SECTION  1983   CESAREAN SECTION     TONSILLECTOMY AND ADENOIDECTOMY      OB History     Gravida  1   Para  1   Term  1   Preterm  0   AB  0   Living  1      SAB  0   IAB  0   Ectopic  0   Multiple  0   Live Births               Home Medications    Prior to Admission medications   Medication Sig Start Date End Date Taking? Authorizing Provider  amLODipine (NORVASC) 10 MG tablet TAKE 1 TABLET BY MOUTH  ONCE DAILY 09/17/21 09/17/22  [name], MD  B-Complex-C TABS Take 1 tablet by mouth daily.    [provider]  Blood Glucose Monitoring Suppl (TRUE METRIX METER) w/Device KIT Check sugar once a day 12/21/18   [name], MD  ciclopirox [facility]) 8 % solution Apply topically at bedtime. Apply over nail and surrounding skin. Apply daily over previous coat. After seven (7) days, may remove with alcohol and continue cycle. 03/12/21   Trula Slade, DPM  COVID-19 mRNA Vac-TriS, Pfizer, SUSP injection USE AS DIRECTED 02/06/21 02/06/22  Carlyle Basques, MD  doravirine (PIFELTRO) 100 MG TABS tablet Take 1 tablet (100 mg total) by mouth daily. 04/24/21   Thayer Headings, MD  emtricitabine-tenofovir AF (DESCOVY) 200-25 MG tablet Take 1 tablet by mouth daily. 04/24/21   Thayer Headings, MD  glucose blood (TRUE METRIX BLOOD GLUCOSE TEST) test strip Check sugar once a day 12/21/18   [name], MD  lisinopril (ZESTRIL) 10 MG tablet TAKE 1 & 1/2 TABLETS BY MOUTH ONCE A DAY 09/17/21 09/17/22  [name], MD  metFORMIN (GLUCOPHAGE) 500 MG tablet TAKE 1/2 TABLET BY MOUTH DAILY 11/26/20 11/26/21  [name], MD  Multiple Vitamin (MULTIVITAMIN) capsule Take 1 capsule by mouth daily.    [provider]  NON FORMULARY Vitamin D, pumpkin seed oil - use as directed    [provider]  spironolactone (ALDACTONE) 100 MG tablet TAKE 1 TABLET BY MOUTH ONCE A DAY 09/17/21 09/17/22  [name], MD  sulfamethoxazole-trimethoprim (BACTRIM DS) 800-160 MG tablet TAKE 1 TABLET BY MOUTH 2 (TWO) TIMES DAILY. 11/29/20 11/29/21  [name], MD  TRUEPLUS LANCETS 28G MISC Check sugar once a day 12/21/18   [name], MD    Family History Family History  Problem Relation Age of Onset   Hypertension Mother    CAD Brother    Heart disease Brother 72       cardiac arrest    Social History Social History   Tobacco Use   Smoking status: Never   Smokeless tobacco: Never  Vaping Use   Vaping Use: Never used  Substance Use Topics   Alcohol use: No   Drug use: No     Allergies   Prilosec [omeprazole] and Viread [tenofovir disoproxil]   Review of Systems Review of Systems  HENT: Negative.    Respiratory: Negative.    Cardiovascular: Negative.   Gastrointestinal:  Positive for abdominal pain. Negative for abdominal distention, anal bleeding, blood in stool, constipation, diarrhea, nausea, rectal pain and vomiting.  Genitourinary: Negative.   Musculoskeletal: Negative.   Skin: Negative.   Neurological: Negative.     Physical Exam Triage Vital Signs ED Triage Vitals  Enc Vitals Group     BP 11/07/21 0828 (!)  156/90     Pulse Rate 11/07/21 0828 89     Resp 11/07/21 0828 18     Temp 11/07/21 0828 98.3 F (36.8 C)     Temp src --      SpO2 11/07/21 0828 99 %     Weight --      Height --      Head Circumference --      Peak Flow --      Pain Score 11/07/21 0826 0     Pain Loc --      Pain Edu? --      Excl. in [place]? --    No data found.  Updated Vital Signs BP (!) 156/90    Pulse 89    Temp 98.3 F (36.8 C)    Resp 18    LMP 05/31/2016 (Approximate)    SpO2 99%   Visual Acuity Right Eye Distance:   Left Eye Distance:   Bilateral Distance:    Right Eye Near:   Left Eye Near:    Bilateral Near:     Physical Exam Constitutional:      Appearance: She is well-developed.  HENT:     Head: Normocephalic.  Eyes:     Extraocular Movements: Extraocular movements intact.  Cardiovascular:     Rate and Rhythm: Normal rate and regular rhythm.  Pulmonary:     Effort: Pulmonary effort is normal.  Abdominal:     General: Abdomen is flat. Bowel sounds are normal.     Palpations: Abdomen is soft.     Tenderness: There is no abdominal tenderness.  Skin:    Comments: Erythematous papular rash present on the right lower flank, nontender, nondraining  Neurological:     General: No focal deficit present.     Mental Status: She is alert and oriented to person, place, and time.  Psychiatric:        Mood and Affect: Mood normal.        Behavior: Behavior normal.     UC Treatments / Results  Labs (all labs ordered are listed, but only abnormal results are displayed) Labs Reviewed - No data to display  EKG   Radiology No results found.  Procedures Procedures (including critical care time)  Medications Ordered in UC Medications - No data to display  Initial Impression / Assessment and Plan / UC Course  I have reviewed the triage vital signs and the nursing notes.  Pertinent labs & imaging results that were available during my care of the patient were reviewed by me and considered  in my medical decision making (see chart for details).  Right lower quadrant abdominal pain  Vital signs are stable, patient in no signs of distress, no tenderness noted on exam, etiology of symptoms possibly gas, discussed with patient, will manage conservatively at this time as prescribed, follow-up with PCP in 2 to 3 weeks, given strict precautions to go to nearest emergency department if abdominal pain returns and increases in severity, patient would like to conservatively manage the rash allowing it to heal on its own, recommended over-the-counter hydrocortisone if wanting to attempt use of medication Final Clinical Impressions(s) / UC Diagnoses   Final diagnoses:  None   Discharge Instructions   None    ED Prescriptions   None    PDMP not reviewed this encounter.   Hans Eden, NP 11/07/21 1022

## 2021-11-07 NOTE — Discharge Instructions (Signed)
At this time we will manage her symptoms conservatively and attempt to reduce any gas and acid in your stomach to see if this is helpful, you may use Maalox up to 4 times a day, ideally before you eat, in the meantime please avoid greasy, spicy, overly seasoned foods as to not cause stomach irritation  Please call your primary doctor today to schedule an appointment in 2 to 3 weeks for follow-up, at any point if your abdominal pain becomes more severe please come to the nearest emergency department for further evaluation  The rash on your side you would like to let it heal on its own, if you are wanting to attempt use of medication you may start with over-the-counter hydrocortisone cream applying twice daily

## 2021-11-07 NOTE — ED Triage Notes (Signed)
Pt reports rt sided pain . Pt reports she looked up gallbladder pain. Pt reports bloating ,increased gas.

## 2021-11-28 ENCOUNTER — Other Ambulatory Visit (HOSPITAL_COMMUNITY): Payer: Self-pay

## 2021-12-03 ENCOUNTER — Other Ambulatory Visit (HOSPITAL_COMMUNITY): Payer: Self-pay

## 2021-12-03 ENCOUNTER — Telehealth: Payer: Self-pay

## 2021-12-03 NOTE — Telephone Encounter (Signed)
[facility] Patient Advocate Encounter   Was successful in obtaining a [employer] copay card for [employer].  This copay card will make the patients copay 0.00.  I have spoken with the patient.    The billing information is as follows and has been shared with [place].  RxBin: [id number] PCN: Loyalty Member ID: [id number] Group ID: [id number]  Ileene Patrick, [place] Patient [place] [facility] Phone: [phone] Fax:  [phone]

## 2021-12-12 ENCOUNTER — Ambulatory Visit: Payer: 59 | Admitting: Internal Medicine

## 2021-12-26 ENCOUNTER — Other Ambulatory Visit (HOSPITAL_COMMUNITY): Payer: Self-pay

## 2021-12-30 ENCOUNTER — Other Ambulatory Visit (HOSPITAL_COMMUNITY): Payer: Self-pay

## 2021-12-31 ENCOUNTER — Other Ambulatory Visit (HOSPITAL_COMMUNITY): Payer: Self-pay

## 2022-01-02 ENCOUNTER — Other Ambulatory Visit (HOSPITAL_COMMUNITY): Payer: Self-pay

## 2022-01-17 ENCOUNTER — Encounter: Payer: Self-pay | Admitting: Internal Medicine

## 2022-01-17 ENCOUNTER — Ambulatory Visit: Payer: No Typology Code available for payment source | Attending: Internal Medicine | Admitting: Internal Medicine

## 2022-01-17 ENCOUNTER — Other Ambulatory Visit (HOSPITAL_COMMUNITY): Payer: Self-pay

## 2022-01-17 ENCOUNTER — Other Ambulatory Visit: Payer: Self-pay

## 2022-01-17 VITALS — BP 118/76 | HR 100 | Resp 16 | Wt 216.6 lb

## 2022-01-17 DIAGNOSIS — Z6837 Body mass index (BMI) 37.0-37.9, adult: Secondary | ICD-10-CM

## 2022-01-17 DIAGNOSIS — Z23 Encounter for immunization: Secondary | ICD-10-CM | POA: Diagnosis not present

## 2022-01-17 DIAGNOSIS — E669 Obesity, unspecified: Secondary | ICD-10-CM

## 2022-01-17 DIAGNOSIS — I10 Essential (primary) hypertension: Secondary | ICD-10-CM

## 2022-01-17 DIAGNOSIS — E1169 Type 2 diabetes mellitus with other specified complication: Secondary | ICD-10-CM | POA: Diagnosis not present

## 2022-01-17 DIAGNOSIS — B20 Human immunodeficiency virus [HIV] disease: Secondary | ICD-10-CM

## 2022-01-17 DIAGNOSIS — N3946 Mixed incontinence: Secondary | ICD-10-CM

## 2022-01-17 DIAGNOSIS — R87618 Other abnormal cytological findings on specimens from cervix uteri: Secondary | ICD-10-CM

## 2022-01-17 LAB — POCT GLYCOSYLATED HEMOGLOBIN (HGB A1C): HbA1c, POC (controlled diabetic range): 12.8 % — AB (ref 0.0–7.0)

## 2022-01-17 LAB — GLUCOSE, POCT (MANUAL RESULT ENTRY): POC Glucose: 204 mg/dl — AB (ref 70–99)

## 2022-01-17 MED ORDER — METFORMIN HCL 500 MG PO TABS
500.0000 mg | ORAL_TABLET | Freq: Two times a day (BID) | ORAL | 2 refills | Status: DC
  Filled 2022-01-17: qty 180, 90d supply, fill #0

## 2022-01-17 MED ORDER — AMLODIPINE BESYLATE 10 MG PO TABS
ORAL_TABLET | Freq: Every day | ORAL | 3 refills | Status: DC
  Filled 2022-01-17: qty 90, 90d supply, fill #0
  Filled 2022-08-11: qty 90, 90d supply, fill #1
  Filled 2022-11-11: qty 90, 90d supply, fill #2

## 2022-01-17 MED ORDER — LISINOPRIL 10 MG PO TABS
ORAL_TABLET | ORAL | 3 refills | Status: DC
  Filled 2022-01-17: qty 135, 90d supply, fill #0

## 2022-01-17 NOTE — Patient Instructions (Signed)
I have referred you to the gynecologist. ?I have referred you to endocrinology.  We have increased your metformin to 500 mg twice a day. ? ?Diabetes Mellitus and Nutrition, Adult ?When you have diabetes, or diabetes mellitus, it is very important to have healthy eating habits because your blood sugar (glucose) levels are greatly affected by what you eat and drink. Eating healthy foods in the right amounts, at about the same times every day, can help you: ?Manage your blood glucose. ?Lower your risk of heart disease. ?Improve your blood pressure. ?Reach or maintain a healthy weight. ?What can affect my meal plan? ?Every person with diabetes is different, and each person has different needs for a meal plan. Your health care provider may recommend that you work with a dietitian to make a meal plan that is best for you. Your meal plan may vary depending on factors such as: ?The calories you need. ?The medicines you take. ?Your weight. ?Your blood glucose, blood pressure, and cholesterol levels. ?Your activity level. ?Other health conditions you have, such as heart or kidney disease. ?How do carbohydrates affect me? ?Carbohydrates, also called carbs, affect your blood glucose level more than any other type of food. Eating carbs raises the amount of glucose in your blood. ?It is important to know how many carbs you can safely have in each meal. This is different for every person. Your dietitian can help you calculate how many carbs you should have at each meal and for each snack. ?How does alcohol affect me? ?Alcohol can cause a decrease in blood glucose (hypoglycemia), especially if you use insulin or take certain diabetes medicines by mouth. Hypoglycemia can be a life-threatening condition. Symptoms of hypoglycemia, such as sleepiness, dizziness, and confusion, are similar to symptoms of having too much alcohol. ?Do not drink alcohol if: ?Your health care provider tells you not to drink. ?You are pregnant, may be  pregnant, or are planning to become pregnant. ?If you drink alcohol: ?Limit how much you have to: ?0-1 drink a day for women. ?0-2 drinks a day for men. ?Know how much alcohol is in your drink. In the [place] one drink equals one 12 oz bottle of beer (355 mL), one 5 oz glass of wine (148 mL), or one 1? oz glass of hard liquor (44 mL). ?Keep yourself hydrated with water, diet soda, or unsweetened iced tea. Keep in mind that regular soda, juice, and other mixers may contain a lot of sugar and must be counted as carbs. ?What are tips for following this plan? ?Reading food labels ?Start by checking the serving size on the Nutrition Facts label of packaged foods and drinks. The number of calories and the amount of carbs, fats, and other nutrients listed on the label are based on one serving of the item. Many items contain more than one serving per package. ?Check the total grams (g) of carbs in one serving. ?Check the number of grams of saturated fats and trans fats in one serving. Choose foods that have a low amount or none of these fats. ?Check the number of milligrams (mg) of salt (sodium) in one serving. Most people should limit total sodium intake to less than 2,300 mg per day. ?Always check the nutrition information of foods labeled as "low-fat" or "nonfat." These foods may be higher in added sugar or refined carbs and should be avoided. ?Talk to your dietitian to identify your daily goals for nutrients listed on the label. ?Shopping ?Avoid buying canned, pre-made, or processed foods.  These foods tend to be high in fat, sodium, and added sugar. ?Shop around the outside edge of the grocery store. This is where you will most often find fresh fruits and vegetables, bulk grains, fresh meats, and fresh dairy products. ?Cooking ?Use low-heat cooking methods, such as baking, instead of high-heat cooking methods, such as deep frying. ?Cook using healthy oils, such as olive, canola, or sunflower oil. ?Avoid cooking with  butter, cream, or high-fat meats. ?Meal planning ?Eat meals and snacks regularly, preferably at the same times every day. Avoid going long periods of time without eating. ?Eat foods that are high in fiber, such as fresh fruits, vegetables, beans, and whole grains. ?Eat 4-6 oz (112-168 g) of lean protein each day, such as lean meat, chicken, fish, eggs, or tofu. One ounce (oz) (28 g) of lean protein is equal to: ?1 oz (28 g) of meat, chicken, or fish. ?1 egg. ?? cup (62 g) of tofu. ?Eat some foods each day that contain healthy fats, such as avocado, nuts, seeds, and fish. ?What foods should I eat? ?Fruits ?Berries. Apples. Oranges. Peaches. Apricots. Plums. Grapes. Mangoes. Papayas. Pomegranates. Kiwi. Cherries. ?Vegetables ?Leafy greens, including lettuce, spinach, kale, chard, collard greens, mustard greens, and cabbage. Beets. Cauliflower. Broccoli. Carrots. Green beans. Tomatoes. Peppers. Onions. Cucumbers. Brussels sprouts. ?Grains ?Whole grains, such as whole-wheat or whole-grain bread, crackers, tortillas, cereal, and pasta. Unsweetened oatmeal. Quinoa. Brown or wild rice. ?Meats and other proteins ?Seafood. Poultry without skin. Lean cuts of poultry and beef. Tofu. Nuts. Seeds. ?Dairy ?Low-fat or fat-free dairy products such as milk, yogurt, and cheese. ?The items listed above may not be a complete list of foods and beverages you can eat and drink. Contact a dietitian for more information. ?What foods should I avoid? ?Fruits ?Fruits canned with syrup. ?Vegetables ?Canned vegetables. Frozen vegetables with butter or cream sauce. ?Grains ?Refined white flour and flour products such as bread, pasta, snack foods, and cereals. Avoid all processed foods. ?Meats and other proteins ?Fatty cuts of meat. Poultry with skin. Breaded or fried meats. Processed meat. Avoid saturated fats. ?Dairy ?Full-fat yogurt, cheese, or milk. ?Beverages ?Sweetened drinks, such as soda or iced tea. ?The items listed above may not be a  complete list of foods and beverages you should avoid. Contact a dietitian for more information. ?Questions to ask a health care provider ?Do I need to meet with a certified diabetes care and education specialist? ?Do I need to meet with a dietitian? ?What number can I call if I have questions? ?When are the best times to check my blood glucose? ?Where to find more information: ?American Diabetes Association: diabetes.org ?Academy of Nutrition and Dietetics: eatright.org ?[employer] of Diabetes and Digestive and Kidney Diseases: AmenCredit.is ?Association of Diabetes Care & Education Specialists: diabeteseducator.org ?Summary ?It is important to have healthy eating habits because your blood sugar (glucose) levels are greatly affected by what you eat and drink. It is important to use alcohol carefully. ?A healthy meal plan will help you manage your blood glucose and lower your risk of heart disease. ?Your health care provider may recommend that you work with a dietitian to make a meal plan that is best for you. ?This information is not intended to replace advice given to you by your health care provider. Make sure you discuss any questions you have with your health care provider. ?Document Revised: 06/06/2020 Document Reviewed: 06/06/2020 ?Elsevier Patient Education ? [place]. ? ?

## 2022-01-17 NOTE — Progress Notes (Signed)
Patient ID: Wanda Kane, female    DOB: 14-Dec-1960  MRN: [id number]  CC: Chronic disease management  Subjective: Wanda Kane is a 61 y.o. female who presents for chronic disease management.  Last seen 03/2022 Her concerns today include:  hx of HIV (followed by ID with undetected viral load), HTN, DM, obesity, fibroid, HL,  pap with pos HPV, DVT with filter, pancreatitis (02/2019 through to be due to med [place]).   DIABETES TYPE 2/Obesity Last A1C:   Results for orders placed or performed in visit on 01/17/22  POCT glucose (manual entry)  Result Value Ref Range   POC Glucose 204 (A) 70 - 99 mg/dl  POCT glycosylated hemoglobin (Hb A1C)  Result Value Ref Range   Hemoglobin A1C     HbA1c POC (<> result, manual entry)     HbA1c, POC (prediabetic range)     HbA1c, POC (controlled diabetic range) 12.8 (A) 0.0 - 7.0 %   Med Adherence:  Suppose to be on Metformin 500 mg 1/2 tab daily.  However, she was taking it only 4 x a wk Diet Adherence: Admits that she has not been doing well with her eating habits.  She has been eating more white stuff in particular bread and snacking on chips.    Attributes over eating to stress.  Doing an internship and going to school.  Wgh down 9 lbs since 04/2021. Exercise: []  Yes    [x]  No Last eye exam:  has appt 03/04/2022 Comments:   HIV:  last appt with ID was 04/2022.  Has appt 02/2022.  Reports compliance with Descovy and Pifeltro.  Last RNA quant done about a year ago was undetectable. Pap smear that was done in 2020 revealed ASCUS and was HPV positive with negative subtyping.  Repeat Pap done 04/2021 by ID with HPV positive again with absent transformation zone.  Referral to GYN was recommended.  Patient at that time stated that she was going to research and find a gynecologist she wanted to go to.  However she forgot to do so.  HTN: Reports compliance with taking lisinopril, amlodipine and spironolactone.  Checks blood pressure regularly.  Reports  readings have been good in the 120s over 70s.  She thinks blood pressure is a little high today because she ate some soup before coming here that was a little salted.   Complains of leakage of urine.  Sometimes she is unable to hold her urine until she gets to the restroom.  She also gets leakage with coughing and laughing.  She wears a poise pads.  Reports being told that she can be referred for pelvic floor exercises to help with this.  [facility]20[facility] for rash on RT upper abdomin and pain.  Thought it was GB but was told that it was not  HM:  Due for shingrix.  Agreed to receiving first shot today.  She has history of positive Cologuard.  She has delayed getting colonoscopy because of $500 co-pay.  However she is back on her previous insurance.  She plans to check to see whether she would still have a $500 co-pay or not. Patient Active Problem List   Diagnosis Date Noted   Urinary leakage 04/23/2021   Microalbuminuria due to type 2 diabetes mellitus ([place]) 11/04/2020   Statin declined 11/02/2020   Fatty liver 03/28/2019   Hypokalemia 03/11/2019   Pancreatitis 03/09/2019   Myositis 03/09/2019   History of DVT (deep vein thrombosis) 03/09/2019   Presence of  IVC filter 03/09/2019   Leukocytosis 03/09/2019   Type 2 diabetes mellitus with hyperglycemia ([place]) 03/03/2019   New onset type 2 diabetes mellitus ([place]) 12/21/2018   Class 2 severe obesity with serious comorbidity and body mass index (BMI) of 39.0 to 39.9 in adult ([place]) 12/21/2018   Hyperlipemia, mixed 12/21/2018   Nail fungus 01/01/2018   Genital herpes 08/12/2017   Positive fecal immunochemical test 06/29/2017   Pap smear of cervix shows high risk HPV present 06/22/2017   HIV disease ([place]) 11/22/2015   Hypertension 38/88/2800   Metabolic syndrome 34/91/7915   Cutaneous skin tags 10/26/2014   Anemia 10/12/2013   Myalgia 02/09/2013   Condyloma acuminatum 10/27/2012   Acquired acanthosis nigricans 10/27/2012   Uterine  fibroid 10/27/2012   Hyperlipidemia associated with type 2 diabetes mellitus ([place]) 10/18/2012     Current Outpatient Medications on File Prior to Visit  Medication Sig Dispense Refill   B-Complex-C TABS Take 1 tablet by mouth daily.     Blood Glucose Monitoring Suppl (TRUE METRIX METER) w/Device KIT Check sugar once a day 1 kit 0   COVID-19 mRNA Vac-TriS, Pfizer, SUSP injection USE AS DIRECTED .3 mL 0   doravirine (PIFELTRO) 100 MG TABS tablet Take 1 tablet (100 mg total) by mouth daily. 30 tablet 11   emtricitabine-tenofovir AF (DESCOVY) 200-25 MG tablet Take 1 tablet by mouth daily. 30 tablet 11   glucose blood (TRUE METRIX BLOOD GLUCOSE TEST) test strip Check sugar once a day 100 each 12   Multiple Vitamin (MULTIVITAMIN) capsule Take 1 capsule by mouth daily.     NON FORMULARY Vitamin D, pumpkin seed oil - use as directed     spironolactone (ALDACTONE) 100 MG tablet TAKE 1 TABLET BY MOUTH ONCE A DAY 90 tablet 0   TRUEPLUS LANCETS 28G MISC Check sugar once a day 100 each 12   No current facility-administered medications on file prior to visit.    Allergies  Allergen Reactions   Prilosec [Omeprazole] Other (See Comments)   Viread [Tenofovir Disoproxil] Other (See Comments)    Rhabdomyolysis but she has tolerated TDF and TAF since then    Social History   Socioeconomic History   Marital status: Single    Spouse name: Not on file   Number of children: Not on file   Years of education: college, BSW   Highest education level: Not on file  Occupational History   Occupation: Chartered certified accountant    Employer: [employer]  Tobacco Use   Smoking status: Never   Smokeless tobacco: Never  Vaping Use   Vaping Use: Never used  Substance and Sexual Activity   Alcohol use: No   Drug use: No   Sexual activity: Not Currently    Partners: Male    Comment: patient declined condoms  Other Topics Concern   Not on file  Social History Narrative   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   She  loves her job   Has BSW   Working towards LPN and Therapist, sports   Social Determinants of Radio broadcast assistant Strain: Not on [employer] Insecurity: Not on file  Transportation Needs: Not on file  Physical Activity: Not on file  Stress: Not on file  Social Connections: Not on file  Intimate Partner Violence: Not on file    Family History  Problem Relation Age of Onset   Hypertension Mother    CAD Brother    Heart disease Brother 109  cardiac arrest    Past Surgical History:  Procedure Laterality Date   CESAREAN SECTION  1983   CESAREAN SECTION     TONSILLECTOMY AND ADENOIDECTOMY      ROS: Review of Systems Negative except as stated above  PHYSICAL EXAM: BP 118/76    Pulse 100    Resp 16    Wt 216 lb 9.6 oz (98.2 kg)    LMP 05/31/2016 (Approximate)    SpO2 97%    BMI 37.18 kg/m   Wt Readings from Last 3 Encounters:  01/17/22 216 lb 9.6 oz (98.2 kg)  04/23/21 225 lb (102.1 kg)  04/04/21 221 lb (100.2 kg)    Physical Exam  General appearance - alert, well appearing, and in no distress Mental status - normal mood, behavior, speech, dress, motor activity, and thought processes Neck - supple, no significant adenopathy Chest - clear to auscultation, no wheezes, rales or rhonchi, symmetric air entry Heart - normal rate, regular rhythm, normal S1, S2, no murmurs, rubs, clicks or gallops Extremities - peripheral pulses normal, no pedal edema, no clubbing or cyanosis   CMP Latest Ref Rng & Units 01/21/2021 01/18/2020 05/03/2019  Glucose 65 - 99 mg/dL 160(H) 127(H) 163(H)  BUN 7 - 25 mg/dL 21 15 16   Creatinine 0.50 - 0.99 mg/dL 0.76 0.76 1.01  Sodium 135 - 146 mmol/L 140 137 133(L)  Potassium 3.5 - 5.3 mmol/L 4.1 4.2 4.7  Chloride 98 - 110 mmol/L 104 103 100  CO2 20 - 32 mmol/L 26 23 24   Calcium 8.6 - 10.4 mg/dL 10.1 9.8 10.1  Total Protein 6.1 - 8.1 g/dL 7.0 7.3 7.5  Total Bilirubin 0.2 - 1.2 mg/dL 0.5 0.6 0.4  Alkaline Phos 38 - 126 U/L - - -  AST 10 - 35 U/L 20 25 18    ALT 6 - 29 U/L 17 23 18    Lipid Panel     Component Value Date/Time   CHOL 258 (H) 01/21/2021 0937   CHOL 253 (H) 11/16/2018 0956   TRIG 195 (H) 01/21/2021 0937   HDL 53 01/21/2021 0937   HDL 55 11/16/2018 0956   CHOLHDL 4.9 01/21/2021 0937   VLDL 24 03/07/2019 0618   LDLCALC 170 (H) 01/21/2021 0937    CBC    Component Value Date/Time   WBC 10.4 01/21/2021 0937   RBC 4.53 01/21/2021 0937   HGB 14.2 01/21/2021 0937   HGB 14.0 11/16/2018 0956   HCT 41.6 01/21/2021 0937   HCT 41.6 11/16/2018 0956   PLT 310 01/21/2021 0937   PLT 275 11/16/2018 0956   MCV 91.8 01/21/2021 0937   MCV 88 11/16/2018 0956   MCH 31.3 01/21/2021 0937   MCHC 34.1 01/21/2021 0937   RDW 12.9 01/21/2021 0937   RDW 12.5 11/16/2018 0956   LYMPHSABS 5,450 (H) 01/21/2021 0937   MONOABS 1.4 (H) 03/07/2019 0332   EOSABS 166 01/21/2021 0937   BASOSABS 52 01/21/2021 0937    ASSESSMENT AND PLAN: 1. Type 2 diabetes mellitus with obesity (HCC) Significantly uncontrolled due to noncompliance with medication, dietary indiscretion and lack of exercise.  Patient plans to do better with her eating habits and try to move more. In regards to medication, we have discussed possibly trying her with GLP1 agent but she has had pancreatitis in the past where she was hospitalized.  It was thought to be due to one of the HIV medications that she was on at the time.  She has not had a recurrent episode.  However  I am a bit skeptical about trying her with Trulicity or Ozempic because of this history.  She is agreeable to seeing endocrinology to get a second opinion on this. In the meantime, I recommend starting evening dose of Lantus insulin and increasing the metformin.  Patient is reluctant and declines starting insulin for various reasons including feeling like she would be at the end of the road.  I tried to convince her otherwise informing her that insulin is a natural hormone produced by the body but in some patients with  diabetes type 2 the pancreas will burnout over time and exogenous insulin would be recommended.  Pt still declined.  She wants to do just the metformin and be referred to endocrinology - POCT glucose (manual entry) - POCT glycosylated hemoglobin (Hb A1C) - Ambulatory referral to Endocrinology - Microalbumin / creatinine urine ratio - metFORMIN (GLUCOPHAGE) 500 MG tablet; Take 1 tablet (500 mg total) by mouth 2 (two) times daily with a meal.  Dispense: 180 tablet; Refill: 2  2. Essential hypertension At goal.  Continue current medications and low-salt diet - amLODipine (NORVASC) 10 MG tablet; TAKE 1 TABLET BY MOUTH ONCE DAILY  Dispense: 90 tablet; Refill: 3 - lisinopril (ZESTRIL) 10 MG tablet; TAKE 1 & 1/2 TABLETS BY MOUTH ONCE A DAY  Dispense: 135 tablet; Refill: 3  3. Urinary incontinence, mixed Encourage Kegel's exercises.  Will refer to physical therapy for pelvic floor therapy. - Ambulatory referral to Physical Therapy  4. HIV disease [facility]) Patient will keep her upcoming appointment with ID next month.  She will continue her current medications as prescribed by them.  5. 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 Need for shingles vaccine Given first Shingrix shot today.    Patient was given the opportunity to ask questions.  Patient verbalized understanding of the plan and was able to repeat key elements of the plan.   This documentation was completed using Radio producer.  Any transcriptional errors are unintentional.  Orders Placed This Encounter  Procedures   Varicella-zoster vaccine IM   Microalbumin / creatinine urine ratio   Ambulatory referral to Endocrinology   Ambulatory referral to Physical Therapy   POCT glucose (manual entry)   POCT glycosylated hemoglobin (Hb A1C)     Requested Prescriptions   Signed Prescriptions Disp Refills   amLODipine (NORVASC) 10 MG  tablet 90 tablet 3    Sig: TAKE 1 TABLET BY MOUTH ONCE DAILY   lisinopril (ZESTRIL) 10 MG tablet 135 tablet 3    Sig: TAKE 1 & 1/2 TABLETS BY MOUTH ONCE A DAY   metFORMIN (GLUCOPHAGE) 500 MG tablet 180 tablet 2    Sig: Take 1 tablet (500 mg total) by mouth 2 (two) times daily with a meal.    Return in about 2 months (around 03/19/2022).  Karle Plumber, MD, FACP

## 2022-01-19 LAB — MICROALBUMIN / CREATININE URINE RATIO
Creatinine, Urine: 254.8 mg/dL
Microalb/Creat Ratio: 12 mg/g creat (ref 0–29)
Microalbumin, Urine: 30.7 ug/mL

## 2022-01-22 ENCOUNTER — Other Ambulatory Visit (HOSPITAL_COMMUNITY): Payer: Self-pay

## 2022-01-24 ENCOUNTER — Other Ambulatory Visit (HOSPITAL_COMMUNITY): Payer: Self-pay

## 2022-01-24 ENCOUNTER — Encounter: Payer: Self-pay | Admitting: Internal Medicine

## 2022-01-24 DIAGNOSIS — E1169 Type 2 diabetes mellitus with other specified complication: Secondary | ICD-10-CM

## 2022-01-24 DIAGNOSIS — E669 Obesity, unspecified: Secondary | ICD-10-CM

## 2022-01-24 MED ORDER — TRUE METRIX METER W/DEVICE KIT
PACK | 0 refills | Status: DC
  Filled 2022-01-24: qty 1, 30d supply, fill #0
  Filled 2022-01-25: qty 1, 1d supply, fill #0

## 2022-01-24 MED ORDER — FREESTYLE LANCETS MISC
Freq: Every day | 12 refills | Status: DC
  Filled 2022-01-24 – 2022-01-25 (×2): qty 100, 90d supply, fill #0

## 2022-01-24 MED ORDER — GLUCOSE BLOOD VI STRP
ORAL_STRIP | Freq: Every day | 12 refills | Status: DC
  Filled 2022-01-24 – 2022-01-25 (×2): qty 100, 90d supply, fill #0

## 2022-01-25 ENCOUNTER — Other Ambulatory Visit (HOSPITAL_COMMUNITY): Payer: Self-pay

## 2022-01-27 ENCOUNTER — Other Ambulatory Visit (HOSPITAL_COMMUNITY): Payer: Self-pay

## 2022-01-30 ENCOUNTER — Other Ambulatory Visit: Payer: Self-pay | Admitting: Internal Medicine

## 2022-01-30 ENCOUNTER — Other Ambulatory Visit (HOSPITAL_COMMUNITY): Payer: Self-pay

## 2022-01-30 ENCOUNTER — Encounter: Payer: Self-pay | Admitting: Internal Medicine

## 2022-01-30 MED ORDER — POLYMYXIN B-TRIMETHOPRIM 10000-0.1 UNIT/ML-% OP SOLN
1.0000 [drp] | Freq: Four times a day (QID) | OPHTHALMIC | 0 refills | Status: DC
  Filled 2022-01-30: qty 10, 7d supply, fill #0

## 2022-02-03 ENCOUNTER — Other Ambulatory Visit (HOSPITAL_COMMUNITY): Payer: Self-pay

## 2022-02-05 ENCOUNTER — Other Ambulatory Visit: Payer: Self-pay | Admitting: Internal Medicine

## 2022-02-05 DIAGNOSIS — Z1231 Encounter for screening mammogram for malignant neoplasm of breast: Secondary | ICD-10-CM

## 2022-02-25 ENCOUNTER — Other Ambulatory Visit (HOSPITAL_COMMUNITY): Payer: Self-pay

## 2022-02-26 ENCOUNTER — Other Ambulatory Visit (HOSPITAL_COMMUNITY): Payer: Self-pay

## 2022-02-28 ENCOUNTER — Other Ambulatory Visit: Payer: 59

## 2022-03-01 ENCOUNTER — Other Ambulatory Visit (HOSPITAL_COMMUNITY): Payer: Self-pay

## 2022-03-01 ENCOUNTER — Other Ambulatory Visit: Payer: Self-pay | Admitting: Internal Medicine

## 2022-03-01 DIAGNOSIS — I10 Essential (primary) hypertension: Secondary | ICD-10-CM

## 2022-03-03 ENCOUNTER — Other Ambulatory Visit (HOSPITAL_COMMUNITY): Payer: Self-pay

## 2022-03-03 MED ORDER — SPIRONOLACTONE 100 MG PO TABS
ORAL_TABLET | Freq: Every day | ORAL | 0 refills | Status: DC
  Filled 2022-03-03: qty 90, 90d supply, fill #0

## 2022-03-03 NOTE — Telephone Encounter (Signed)
Requested medication (s) are due for refill today:   Yes ? ?Requested medication (s) are on the active medication list:   Yes ? ?Future visit scheduled:   Yes ? ? ?Last ordered: 09/17/2021 #90, 0 refills ? ?Returned because per protocol labs are overdue.  ? ?Requested Prescriptions  ?Pending Prescriptions Disp Refills  ? spironolactone (ALDACTONE) 100 MG tablet 90 tablet 0  ?  Sig: TAKE 1 TABLET BY MOUTH ONCE A DAY  ?  ? Cardiovascular: Diuretics - Aldosterone Antagonist Failed - 03/01/2022  8:36 AM  ?  ?  Failed - Cr in normal range and within 180 days  ?  Creat  ?Date Value Ref Range Status  ?01/21/2021 0.76 0.50 - 0.99 mg/dL Final  ?  Comment:  ?  For patients >95 years of age, the reference limit ?for Creatinine is approximately 13% higher for people ?identified as African-American. ?. ?  ?  ?  ?  ?  Failed - K in normal range and within 180 days  ?  Potassium  ?Date Value Ref Range Status  ?01/21/2021 4.1 3.5 - 5.3 mmol/L Final  ?  ?  ?  ?  Failed - Na in normal range and within 180 days  ?  Sodium  ?Date Value Ref Range Status  ?01/21/2021 140 135 - 146 mmol/L Final  ?11/16/2018 144 134 - 144 mmol/L Final  ?  ?  ?  ?  Failed - eGFR is 30 or above and within 180 days  ?  GFR, Est African American  ?Date Value Ref Range Status  ?01/21/2021 99 > OR = 60 mL/min/1.34m Final  ? ?GFR, Est Non African American  ?Date Value Ref Range Status  ?01/21/2021 85 > OR = 60 mL/min/1.714mFinal  ?  ?  ?  ?  Passed - Last BP in normal range  ?  BP Readings from Last 1 Encounters:  ?01/17/22 118/76  ?  ?  ?  ?  Passed - Valid encounter within last 6 months  ?  Recent Outpatient Visits   ? ?      ? 1 month ago Type 2 diabetes mellitus with obesity ([facility] [place] ? [facility] Pier[place]  ? 11 months ago Type 2 diabetes mellitus with obesity ([facility] ? [facility]  ? 1 year ago Abscess, axilla  ? [facility] Pier[place]  ? 1 year ago Type 2 diabetes mellitus with morbid obesity ([facility] ? [place]  ? 1 year ago Type 2 diabetes mellitus with morbid obesity ([facility] [place] ? [place]  ? ?  ?  ?Future Appointments   ? ?        ? In 1 month JoLadell PierMD [place]? ?  ? ? ?  ?  ?  ? ?

## 2022-03-04 ENCOUNTER — Ambulatory Visit: Payer: No Typology Code available for payment source

## 2022-03-04 ENCOUNTER — Other Ambulatory Visit (HOSPITAL_COMMUNITY): Payer: Self-pay

## 2022-03-07 ENCOUNTER — Other Ambulatory Visit (HOSPITAL_COMMUNITY): Payer: Self-pay

## 2022-03-10 ENCOUNTER — Other Ambulatory Visit (HOSPITAL_COMMUNITY): Payer: Self-pay

## 2022-03-12 ENCOUNTER — Ambulatory Visit: Payer: No Typology Code available for payment source | Attending: Internal Medicine | Admitting: Physical Therapy

## 2022-03-12 ENCOUNTER — Encounter: Payer: Self-pay | Admitting: Physical Therapy

## 2022-03-12 DIAGNOSIS — R279 Unspecified lack of coordination: Secondary | ICD-10-CM | POA: Diagnosis not present

## 2022-03-12 DIAGNOSIS — R293 Abnormal posture: Secondary | ICD-10-CM | POA: Diagnosis not present

## 2022-03-12 DIAGNOSIS — M6281 Muscle weakness (generalized): Secondary | ICD-10-CM | POA: Diagnosis not present

## 2022-03-12 DIAGNOSIS — N3946 Mixed incontinence: Secondary | ICD-10-CM | POA: Diagnosis present

## 2022-03-12 NOTE — Patient Instructions (Signed)

## 2022-03-12 NOTE — Therapy (Addendum)
?OUTPATIENT PHYSICAL THERAPY FEMALE PELVIC EVALUATION ? ? ?Patient Name: Wanda Kane ?MRN: [id number] ?DOB:08-02-1961, 61 y.o., female ?Today's Date: 03/12/2022 ? ? PT End of Session - 03/12/22 0951   ? ? Visit Number 1   ? Date for PT Re-Evaluation 06/11/22   ? Authorization Type [facility] focus   ? PT Start Time [phone]   ? PT Stop Time 0930   ? PT Time Calculation (min) 43 min   ? Activity Tolerance Patient tolerated treatment well   ? Behavior During Therapy [facility] for tasks assessed/performed   ? ?  ?  ? ?  ? ? ?Past Medical History:  ?Diagnosis Date  ? DVT (deep venous thrombosis) [facility]) Jan 2016  ? Fibroids 2010  ? Genital herpes 08/12/2017  ? HIV infection ([place]) 1994  ? Hypertension 1994  ? Medical history non-contributory   ? Microalbuminuria due to type 2 diabetes mellitus ([place]) 11/04/2020  ? Myositis 03/09/2019  ? Pancreatitis 03/09/2019  ? Pancreatitis   ? Pre-diabetes   ? Type 2 diabetes mellitus with hyperglycemia ([place]) 03/03/2019  ? diagnosed in December 2019  ? Vaginal bleeding 12/2014  ? due to xarelto  ? ?Past Surgical History:  ?Procedure Laterality Date  ? [place]  ? CESAREAN SECTION    ? TONSILLECTOMY AND ADENOIDECTOMY    ? ?Patient Active Problem List  ? Diagnosis Date Noted  ? Urinary leakage 04/23/2021  ? Microalbuminuria due to type 2 diabetes mellitus ([place]) 11/04/2020  ? Statin declined 11/02/2020  ? Fatty liver 03/28/2019  ? Hypokalemia 03/11/2019  ? Pancreatitis 03/09/2019  ? Myositis 03/09/2019  ? History of DVT (deep vein thrombosis) 03/09/2019  ? Presence of IVC filter 03/09/2019  ? Leukocytosis 03/09/2019  ? Type 2 diabetes mellitus with hyperglycemia ([place]) 03/03/2019  ? New onset type 2 diabetes mellitus ([place]) 12/21/2018  ? Class 2 severe obesity with serious comorbidity and body mass index (BMI) of 39.0 to 39.9 in adult [facility]) 12/21/2018  ? Hyperlipemia, mixed 12/21/2018  ? Nail fungus 01/01/2018  ? Genital herpes 08/12/2017  ? Positive fecal immunochemical test 06/29/2017  ? Pap  smear of cervix shows high risk HPV present 06/22/2017  ? HIV disease ([place]) 11/22/2015  ? Hypertension 12/29/2014  ? Metabolic syndrome 50/07/3817  ? Cutaneous skin tags 10/26/2014  ? Anemia 10/12/2013  ? Myalgia 02/09/2013  ? Condyloma acuminatum 10/27/2012  ? Acquired acanthosis nigricans 10/27/2012  ? Uterine fibroid 10/27/2012  ? Hyperlipidemia associated with type 2 diabetes mellitus ([place]) 10/18/2012  ? ? ?PCP: [name], MD ? ?REFERRING PROVIDER: [name], MD ? ?REFERRING DIAG:  ?N39.46 (ICD-10-CM) - Urinary incontinence, mixed ? ?THERAPY DIAG:  ?Muscle weakness (generalized) ? ?Unspecified lack of coordination ? ?Abnormal posture ? ?ONSET DATE: several years ? ?SUBJECTIVE:                                                                                                                                                                                          ? ?  SUBJECTIVE STATEMENT: ?Pt reports she has had leakage with urine with waiting too long to urinate with strong urgency. Pt reports this has gotten better over the last year as she has restricted fluids during working shifts (3x per week and she works nights). With this she has only noticed very light leakage if any with one pad if she can't get to bathroom quickly enough.  ? ?Fluid intake: Yes: more than half body weight in oz   ? ?Patient confirms identification and approves PT to assess pelvic floor and treatment Yes ? ? ?PAIN:  ?Are you having pain? No ? ? ?PRECAUTIONS: None ? ?WEIGHT BEARING RESTRICTIONS No ? ?FALLS:  ?Has patient fallen in last 6 months? No ? ?LIVING ENVIRONMENT: ?Lives with: lives alone ?Lives in: House/apartment ? ? 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 ?PLOF: Independent ? ?PATIENT GOALS to have less leakage and urgency  ? ?PERTINENT HISTORY:  ?HIV (followed by ID with undetected viral load) - Last RNA quant done about a year ago was undetectable., HTN, DM, obesity, fibroid, HL,  pap  with pos HPV, DVT with filter, pancreatitis, Uterine fibroid, Myalgia, Genital herpes, CESAREAN SECTION, T2DM ?Sexual abuse: No ? ?BOWEL MOVEMENT ?Pain with bowel movement: No ?Type of bowel movement:Type (Bristol Stool Scale) 4, Frequency 2x daily, and Strain No ?Fully empty rectum: Yes:   ?Leakage: No ?Pads: No ?Fiber supplement: No ? ?URINATION ?Pain with urination: No ?Fully empty bladder: Yes:   ?Stream: Strong ?Urgency: Yes:   ?Frequency: 3-4 hours ?Leakage: Urge to void and Walking to the bathroom ?Pads: Yes: one ? ?INTERCOURSE ?Pain with intercourse:  not active ?Ability to have vaginal penetration:  Yes:   ?Climax: no  ?Marinoff Scale: 0/3 ? ?PREGNANCY ?Vaginal deliveries 0 ?Tearing No ?C-section deliveries 1 ?Currently pregnant No ? ?PROLAPSE ?None ? ? ? ?OBJECTIVE:  ? ?DIAGNOSTIC FINDINGS:  ? ? ?COGNITION: ? Overall cognitive status: Within functional limits for tasks assessed   ?  ?SENSATION: ? Light touch: Appears intact ? Proprioception: Appears intact ? ?MUSCLE LENGTH: ?Hamstrings and adductors limited by 25% bil ? ? ?POSTURE:  ?Mild rounded shoulders, posterior pelvic tilt ? ?LUMBARAROM/PROM ? ?WFL ? ?LE ROM: ? ?WFL ? ?LE MMT: ? ?Bil hips grossly 4+/5; knees and ankles 5/5 ? ?PELVIC MMT: deferred by pt to attempt bladder retraining first  ?  ?MMT  ?03/12/2022  ?Vaginal   ?Internal Anal Sphincter   ?External Anal Sphincter   ?Puborectalis   ?Diastasis Recti   ?(Blank rows = not tested) ? ?      PALPATION: ?  General  no TTP at abdomen or glutes or spine or pelvis, no restrictions with fascia  ? ?              External Perineal Exam deferred ?              ?              Internal Pelvic Floor deferred ? ?TONE: ?deferred ? ?PROLAPSE: ?Deferred  ? ?TODAY'S TREATMENT  ?03/12/2022 EVAL Examination completed, findings reviewed, pt educated on POC, HEP, and bladder irritants and urge drill. Pt motivated to participate in PT and agreeable to attempt recommendations.  ? ? ? ?PATIENT EDUCATION:  ?Education  details: [id number] ?Person educated: Patient ?Education method: Explanation, Demonstration, Tactile cues, Verbal cues, and Handouts ?Education comprehension: verbalized understanding and returned demonstration ? ? ?HOME EXERCISE PROGRAM: ?[place] ? ?ASSESSMENT: ? ?CLINICAL IMPRESSION: ?Patient is a 61 y.o. female  who was seen today for physical therapy evaluation and treatment for urinary incontinence and urgency. Pt found to have mild hip weakness and decreased flexibility at bil hips. However with speaking to pt, she deferred internal assessment to attempt bladder retraining techniques and lifestyle modifications irst to see if this helps.  Pt educated on bladder irritants, urge drill, bladder retraining, and HEP with breathing mechanics and hip and core and pelvic floor strengthening exercises. All went over with pt, pt denied additional questions and motivated to return for follow up. Pt would benefit from additional PT to further address deficits.   ? ? ?OBJECTIVE IMPAIRMENTS decreased coordination, decreased endurance, decreased strength, impaired flexibility, improper body mechanics, and postural dysfunction.  ? ?ACTIVITY LIMITATIONS community activity.  ? ?PERSONAL FACTORS Fitness, Time since onset of injury/illness/exacerbation, and 1 comorbidity: one csection  are also affecting patient's functional outcome.  ? ? ?REHAB POTENTIAL: Good ? ?CLINICAL DECISION MAKING: Stable/uncomplicated ? ?EVALUATION COMPLEXITY: Low ? ? ?GOALS: ?Goals reviewed with patient? Yes ? ?SHORT TERM GOALS: Target date: 04/09/2022 ? ?Pt to be I with HEP. ?Baseline: ?Goal status: INITIAL ? ?2.  Pt will have less urgency due to bladder retraining and strengthening with less leakage to no more than once per day. ?Baseline:  ?Goal status: INITIAL ? ?3.  Pt to demonstrate at least 3/5 pelvic floor strength and ability to hold contraction for at least 8s to decrease leakage.  ?Baseline:  ?Goal status: INITIAL ? ?4.  Pt to be I with recall  of bladder irritants and urge drill for improvement of bladder retraining to decrease urgency and leakage.  ?Baseline:  ?Goal status: INITIAL ? ? ? ?LONG TERM GOALS: Target date: 06/11/2022 ? ?Pt to be I wit

## 2022-03-14 ENCOUNTER — Encounter: Payer: 59 | Admitting: Infectious Disease

## 2022-03-20 ENCOUNTER — Ambulatory Visit: Payer: No Typology Code available for payment source | Admitting: Infectious Diseases

## 2022-03-20 ENCOUNTER — Other Ambulatory Visit: Payer: No Typology Code available for payment source

## 2022-03-24 ENCOUNTER — Other Ambulatory Visit (HOSPITAL_COMMUNITY): Payer: Self-pay

## 2022-04-01 ENCOUNTER — Other Ambulatory Visit (HOSPITAL_COMMUNITY): Payer: Self-pay

## 2022-04-01 ENCOUNTER — Ambulatory Visit: Payer: No Typology Code available for payment source | Admitting: Physical Therapy

## 2022-04-02 ENCOUNTER — Telehealth: Payer: No Typology Code available for payment source | Admitting: Infectious Disease

## 2022-04-03 ENCOUNTER — Encounter: Payer: No Typology Code available for payment source | Admitting: Infectious Disease

## 2022-04-08 ENCOUNTER — Ambulatory Visit
Admission: RE | Admit: 2022-04-08 | Discharge: 2022-04-08 | Disposition: A | Discharge status: Home / Self Care | Payer: No Typology Code available for payment source | Source: Ambulatory Visit | Attending: Internal Medicine | Admitting: Internal Medicine

## 2022-04-08 DIAGNOSIS — Z1231 Encounter for screening mammogram for malignant neoplasm of breast: Secondary | ICD-10-CM

## 2022-04-09 ENCOUNTER — Encounter: Payer: Self-pay | Admitting: Internal Medicine

## 2022-04-10 ENCOUNTER — Other Ambulatory Visit: Payer: Self-pay | Admitting: Internal Medicine

## 2022-04-10 DIAGNOSIS — R928 Other abnormal and inconclusive findings on diagnostic imaging of breast: Secondary | ICD-10-CM

## 2022-04-16 ENCOUNTER — Encounter: Payer: No Typology Code available for payment source | Admitting: Physical Therapy

## 2022-04-16 ENCOUNTER — Other Ambulatory Visit: Payer: Self-pay | Admitting: Internal Medicine

## 2022-04-16 ENCOUNTER — Ambulatory Visit
Admission: RE | Admit: 2022-04-16 | Discharge: 2022-04-16 | Disposition: A | Discharge status: Home / Self Care | Payer: No Typology Code available for payment source | Source: Ambulatory Visit | Attending: Internal Medicine | Admitting: Internal Medicine

## 2022-04-16 DIAGNOSIS — R928 Other abnormal and inconclusive findings on diagnostic imaging of breast: Secondary | ICD-10-CM

## 2022-04-16 DIAGNOSIS — R599 Enlarged lymph nodes, unspecified: Secondary | ICD-10-CM

## 2022-04-18 ENCOUNTER — Encounter: Payer: Self-pay | Admitting: Infectious Diseases

## 2022-04-18 ENCOUNTER — Other Ambulatory Visit: Payer: Self-pay

## 2022-04-18 ENCOUNTER — Ambulatory Visit (INDEPENDENT_AMBULATORY_CARE_PROVIDER_SITE_OTHER): Payer: No Typology Code available for payment source | Admitting: Infectious Diseases

## 2022-04-18 ENCOUNTER — Other Ambulatory Visit (HOSPITAL_COMMUNITY)
Admission: RE | Admit: 2022-04-18 | Discharge: 2022-04-18 | Disposition: A | Discharge status: Home / Self Care | Payer: No Typology Code available for payment source | Source: Ambulatory Visit | Attending: Infectious Diseases | Admitting: Infectious Diseases

## 2022-04-18 VITALS — BP 158/90 | HR 93 | Temp 97.8°F | Wt 223.0 lb

## 2022-04-18 DIAGNOSIS — Z124 Encounter for screening for malignant neoplasm of cervix: Secondary | ICD-10-CM | POA: Diagnosis not present

## 2022-04-18 DIAGNOSIS — E669 Obesity, unspecified: Secondary | ICD-10-CM | POA: Diagnosis not present

## 2022-04-18 DIAGNOSIS — R87810 Cervical high risk human papillomavirus (HPV) DNA test positive: Secondary | ICD-10-CM

## 2022-04-18 NOTE — Patient Instructions (Addendum)
Scan in your letter to your mychart and email Dr. Tommy Medal so we can see what we need to do for your medication. Hopefully we can take care of it to where you can continue it!  Will email you when your test results come in.   Referral to the weight management center has been placed for you - let me know if you have any trouble with an appointment

## 2022-04-18 NOTE — Assessment & Plan Note (Signed)
Discussed persistent HPV detected (not 16/18/45 on probe). She does have a h/o condyloma.  Will see what results show this screen and refer to GYN if persistent to discus value of colposcopy to further investigate.  Will let her know on her mychart portal results and recommendations.

## 2022-04-18 NOTE — Progress Notes (Signed)
Subjective :    Wanda Kane is a 61 y.o. female here for follow up pap smear screening.    Review of Systems: Current GYN complaints or concerns: vaginal dryness. Not currently sexually active. Went to pelvic floor PT and found that immensely helpful and incontinence has resolved.    Past Medical History:  Diagnosis Date   DVT (deep venous thrombosis) ([place]) Jan 2016   Fibroids 2010   Genital herpes 08/12/2017   HIV infection ([place]) 1994   Hypertension 1994   Medical history non-contributory    Microalbuminuria due to type 2 diabetes mellitus ([place]) 11/04/2020   Myositis 03/09/2019   Pancreatitis 03/09/2019   Pancreatitis    Pre-diabetes    Type 2 diabetes mellitus with hyperglycemia ([place]) 03/03/2019   diagnosed in December 2019   Vaginal bleeding 12/2014   due to xarelto    Gynecologic History: G1P1001  Patient's last menstrual period was 05/31/2016 (approximate). Contraception: post menopausal status Last Pap: 04/2021. Results were: abnormal Last Mammogram: 04/08/22. Results were: abnormal - has follow up biopsy planned   Objective :   Physical Exam - chaperone present  Constitutional: Well developed, well nourished, no acute distress. She is alert and oriented x3.  Pelvic: External genitalia is normal in appearance. The vagina is atrophied. The cervix is bulbous and easily visualized. No CMT, normal expected cervical mucus present.  Psych: She has a normal mood and affect.      Assessment & Plan:    Patient Active Problem List   Diagnosis Date Noted   Screening for cervical cancer 04/18/2022   Microalbuminuria due to type 2 diabetes mellitus ([place]) 11/04/2020   Statin declined 11/02/2020   Fatty liver 03/28/2019   Hypokalemia 03/11/2019   Pancreatitis 03/09/2019   Myositis 03/09/2019   History of DVT (deep vein thrombosis) 03/09/2019   Presence of IVC filter 03/09/2019   Leukocytosis 03/09/2019   Type 2 diabetes mellitus with hyperglycemia ([place])  03/03/2019   New onset type 2 diabetes mellitus ([place]) 12/21/2018   Class 2 severe obesity with serious comorbidity and body mass index (BMI) of 39.0 to 39.9 in adult ([place]) 12/21/2018   Hyperlipemia, mixed 12/21/2018   Nail fungus 01/01/2018   Genital herpes 08/12/2017   Positive fecal immunochemical test 06/29/2017   Pap smear of cervix shows high risk HPV present 06/22/2017   HIV disease ([place]) 11/22/2015   Hypertension 35/36/1443   Metabolic syndrome 15/40/0867   Cutaneous skin tags 10/26/2014   Anemia 10/12/2013   Myalgia 02/09/2013   Condyloma acuminatum 10/27/2012   Acquired acanthosis nigricans 10/27/2012   Uterine fibroid 10/27/2012   Hyperlipidemia associated with type 2 diabetes mellitus ([place]) 10/18/2012    Problem List Items Addressed This Visit       Unprioritized   Pap smear of cervix shows high risk HPV present (Chronic)   Class 2 severe obesity with serious comorbidity and body mass index (BMI) of 39.0 to 39.9 in adult [facility])    Given co-morbid diabetes I think she may be a great candidate for injectable GLP-1 for weight reduction management and glycemic control.  With pancreatitis recently however would like to get her to weight management clinic to further determine this.   Lab Results  Component Value Date   HGBA1C 12.8 (A) 01/17/2022        Screening for cervical cancer - Primary    Discussed persistent HPV detected (not 16/18/45 on probe). She does have a h/o condyloma.  Will see what  results show this screen and refer to GYN if persistent to discus value of colposcopy to further investigate.  Will let her know on her mychart portal results and recommendations.        Relevant Orders   Cytology - PAP( [place])     Janene Madeira, MSN, NP-C [facility] [place] Office: [phone] Pager: [phone]  04/18/22 9:55 AM

## 2022-04-18 NOTE — Assessment & Plan Note (Signed)
Given co-morbid diabetes I think she may be a great candidate for injectable GLP-1 for weight reduction management and glycemic control.  With pancreatitis recently however would like to get her to weight management clinic to further determine this.   Lab Results  Component Value Date   HGBA1C 12.8 (A) 01/17/2022

## 2022-04-22 ENCOUNTER — Ambulatory Visit
Admission: RE | Admit: 2022-04-22 | Discharge: 2022-04-22 | Disposition: A | Discharge status: Home / Self Care | Payer: No Typology Code available for payment source | Source: Ambulatory Visit | Attending: Internal Medicine | Admitting: Internal Medicine

## 2022-04-22 ENCOUNTER — Other Ambulatory Visit (HOSPITAL_COMMUNITY)
Admission: RE | Admit: 2022-04-22 | Discharge: 2022-04-22 | Disposition: A | Discharge status: Home / Self Care | Payer: No Typology Code available for payment source | Source: Ambulatory Visit | Attending: Body Imaging | Admitting: Body Imaging

## 2022-04-22 ENCOUNTER — Ambulatory Visit: Payer: No Typology Code available for payment source | Admitting: Internal Medicine

## 2022-04-22 DIAGNOSIS — R599 Enlarged lymph nodes, unspecified: Secondary | ICD-10-CM

## 2022-04-22 LAB — CYTOLOGY - PAP
Adequacy: ABSENT
Chlamydia: NEGATIVE
Comment: NEGATIVE
Comment: NEGATIVE
Comment: NEGATIVE
Comment: NEGATIVE
Comment: NORMAL
Diagnosis: NEGATIVE
HPV 16: NEGATIVE
HPV 18 / 45: NEGATIVE
High risk HPV: POSITIVE — AB
Neisseria Gonorrhea: NEGATIVE

## 2022-04-23 ENCOUNTER — Other Ambulatory Visit (HOSPITAL_COMMUNITY): Payer: Self-pay

## 2022-04-23 ENCOUNTER — Other Ambulatory Visit: Payer: Self-pay | Admitting: Internal Medicine

## 2022-04-23 ENCOUNTER — Encounter: Payer: Self-pay | Admitting: Internal Medicine

## 2022-04-23 DIAGNOSIS — B20 Human immunodeficiency virus [HIV] disease: Secondary | ICD-10-CM

## 2022-04-23 LAB — SURGICAL PATHOLOGY

## 2022-04-23 NOTE — Telephone Encounter (Signed)
Appt scheduled on 6/13

## 2022-04-24 ENCOUNTER — Other Ambulatory Visit (HOSPITAL_COMMUNITY): Payer: Self-pay

## 2022-04-24 MED ORDER — DESCOVY 200-25 MG PO TABS
1.0000 | ORAL_TABLET | Freq: Every day | ORAL | 11 refills | Status: DC
  Filled 2022-04-24: qty 30, 30d supply, fill #0

## 2022-04-24 MED ORDER — DORAVIRINE 100 MG PO TABS
100.0000 mg | ORAL_TABLET | Freq: Every day | ORAL | 11 refills | Status: DC
  Filled 2022-04-24: qty 30, 30d supply, fill #0

## 2022-04-25 ENCOUNTER — Other Ambulatory Visit (HOSPITAL_COMMUNITY): Payer: Self-pay

## 2022-04-29 ENCOUNTER — Other Ambulatory Visit (HOSPITAL_COMMUNITY)
Admission: RE | Admit: 2022-04-29 | Discharge: 2022-04-29 | Disposition: A | Discharge status: Home / Self Care | Payer: No Typology Code available for payment source | Source: Ambulatory Visit | Attending: Infectious Disease | Admitting: Infectious Disease

## 2022-04-29 ENCOUNTER — Other Ambulatory Visit: Payer: Self-pay

## 2022-04-29 ENCOUNTER — Encounter: Payer: No Typology Code available for payment source | Admitting: Physical Therapy

## 2022-04-29 ENCOUNTER — Other Ambulatory Visit: Payer: No Typology Code available for payment source

## 2022-04-29 DIAGNOSIS — B20 Human immunodeficiency virus [HIV] disease: Secondary | ICD-10-CM

## 2022-04-29 DIAGNOSIS — Z113 Encounter for screening for infections with a predominantly sexual mode of transmission: Secondary | ICD-10-CM | POA: Insufficient documentation

## 2022-04-29 DIAGNOSIS — Z79899 Other long term (current) drug therapy: Secondary | ICD-10-CM

## 2022-04-30 ENCOUNTER — Encounter: Payer: Self-pay | Admitting: Internal Medicine

## 2022-04-30 ENCOUNTER — Other Ambulatory Visit (HOSPITAL_COMMUNITY): Payer: Self-pay

## 2022-04-30 ENCOUNTER — Encounter: Payer: Self-pay | Admitting: Infectious Disease

## 2022-04-30 LAB — T-HELPER CELL (CD4) - (RCID CLINIC ONLY)
CD4 % Helper T Cell: 19 % — ABNORMAL LOW (ref 33–65)
CD4 T Cell Abs: 774 /uL (ref 400–1790)

## 2022-04-30 LAB — URINE CYTOLOGY ANCILLARY ONLY
Chlamydia: NEGATIVE
Comment: NEGATIVE
Comment: NORMAL
Neisseria Gonorrhea: NEGATIVE

## 2022-05-02 LAB — CBC WITH DIFFERENTIAL/PLATELET
Absolute Monocytes: 541 cells/uL (ref 200–950)
Basophils Absolute: 32 cells/uL (ref 0–200)
Basophils Relative: 0.3 %
Eosinophils Absolute: 127 cells/uL (ref 15–500)
Eosinophils Relative: 1.2 %
HCT: 45.9 % — ABNORMAL HIGH (ref 35.0–45.0)
Hemoglobin: 15.3 g/dL (ref 11.7–15.5)
Lymphs Abs: 4685 cells/uL — ABNORMAL HIGH (ref 850–3900)
MCH: 30.2 pg (ref 27.0–33.0)
MCHC: 33.3 g/dL (ref 32.0–36.0)
MCV: 90.7 fL (ref 80.0–100.0)
MPV: 12.3 fL (ref 7.5–12.5)
Monocytes Relative: 5.1 %
Neutro Abs: 5215 cells/uL (ref 1500–7800)
Neutrophils Relative %: 49.2 %
Platelets: 274 10*3/uL (ref 140–400)
RBC: 5.06 10*6/uL (ref 3.80–5.10)
RDW: 12.4 % (ref 11.0–15.0)
Total Lymphocyte: 44.2 %
WBC: 10.6 10*3/uL (ref 3.8–10.8)

## 2022-05-02 LAB — RPR: RPR Ser Ql: NONREACTIVE

## 2022-05-02 LAB — LIPID PANEL
Cholesterol: 287 mg/dL — ABNORMAL HIGH (ref <200)
HDL: 60 mg/dL (ref >=50)
Non-HDL Cholesterol (Calc): 227 mg/dL (calc) — ABNORMAL HIGH (ref <130)
Total CHOL/HDL Ratio: 4.8 (calc) (ref <5.0)
Triglycerides: 444 mg/dL — ABNORMAL HIGH (ref <150)

## 2022-05-02 LAB — COMPLETE METABOLIC PANEL WITH GFR
AG Ratio: 1.5 (calc) (ref 1.0–2.5)
ALT: 19 U/L (ref 6–29)
AST: 20 U/L (ref 10–35)
Albumin: 4.5 g/dL (ref 3.6–5.1)
Alkaline phosphatase (APISO): 102 U/L (ref 37–153)
BUN: 13 mg/dL (ref 7–25)
CO2: 22 mmol/L (ref 20–32)
Calcium: 9.9 mg/dL (ref 8.6–10.4)
Chloride: 101 mmol/L (ref 98–110)
Creat: 0.84 mg/dL (ref 0.50–1.05)
Globulin: 3 g/dL (calc) (ref 1.9–3.7)
Glucose, Bld: 216 mg/dL — ABNORMAL HIGH (ref 65–99)
Potassium: 4 mmol/L (ref 3.5–5.3)
Sodium: 139 mmol/L (ref 135–146)
Total Bilirubin: 0.6 mg/dL (ref 0.2–1.2)
Total Protein: 7.5 g/dL (ref 6.1–8.1)
eGFR: 79 mL/min/{1.73_m2} (ref >=60)

## 2022-05-02 LAB — HIV-1 RNA QUANT-NO REFLEX-BLD
HIV 1 RNA Quant: <20 Copies/mL — ABNORMAL HIGH
HIV-1 RNA Quant, Log: <1.3 Log cps/mL — ABNORMAL HIGH

## 2022-05-12 ENCOUNTER — Encounter: Payer: Self-pay | Admitting: Infectious Disease

## 2022-05-13 ENCOUNTER — Encounter: Payer: No Typology Code available for payment source | Admitting: Physical Therapy

## 2022-05-13 ENCOUNTER — Encounter: Payer: Self-pay | Admitting: Infectious Disease

## 2022-05-13 ENCOUNTER — Other Ambulatory Visit: Payer: Self-pay

## 2022-05-13 ENCOUNTER — Ambulatory Visit (INDEPENDENT_AMBULATORY_CARE_PROVIDER_SITE_OTHER): Payer: No Typology Code available for payment source | Admitting: Infectious Disease

## 2022-05-13 ENCOUNTER — Other Ambulatory Visit (HOSPITAL_COMMUNITY): Payer: Self-pay

## 2022-05-13 DIAGNOSIS — I10 Essential (primary) hypertension: Secondary | ICD-10-CM

## 2022-05-13 DIAGNOSIS — E782 Mixed hyperlipidemia: Secondary | ICD-10-CM

## 2022-05-13 DIAGNOSIS — B20 Human immunodeficiency virus [HIV] disease: Secondary | ICD-10-CM | POA: Diagnosis not present

## 2022-05-13 DIAGNOSIS — Z6839 Body mass index (BMI) 39.0-39.9, adult: Secondary | ICD-10-CM

## 2022-05-13 DIAGNOSIS — E1165 Type 2 diabetes mellitus with hyperglycemia: Secondary | ICD-10-CM

## 2022-05-13 DIAGNOSIS — K760 Fatty (change of) liver, not elsewhere classified: Secondary | ICD-10-CM

## 2022-05-13 MED ORDER — DORAVIRINE 100 MG PO TABS
100.0000 mg | ORAL_TABLET | Freq: Every day | ORAL | 11 refills | Status: DC
  Filled 2022-05-13: qty 30, 30d supply, fill #0

## 2022-05-13 MED ORDER — DESCOVY 200-25 MG PO TABS
1.0000 | ORAL_TABLET | Freq: Every day | ORAL | 11 refills | Status: DC
  Filled 2022-05-13 – 2022-05-21 (×2): qty 30, 30d supply, fill #0
  Filled 2022-06-11: qty 30, 30d supply, fill #1
  Filled 2022-07-11: qty 30, 30d supply, fill #2
  Filled 2022-08-11: qty 30, 30d supply, fill #3
  Filled 2022-09-11: qty 30, 30d supply, fill #4
  Filled 2022-10-08: qty 30, 30d supply, fill #5
  Filled 2022-11-11: qty 30, 30d supply, fill #6
  Filled 2022-12-12: qty 30, 30d supply, fill #7
  Filled 2023-01-09: qty 30, 30d supply, fill #8
  Filled 2023-02-05: qty 30, 30d supply, fill #9
  Filled 2023-03-04: qty 30, 30d supply, fill #10
  Filled 2023-04-08: qty 30, 30d supply, fill #11

## 2022-05-13 MED ORDER — DESCOVY 200-25 MG PO TABS
1.0000 | ORAL_TABLET | Freq: Every day | ORAL | 11 refills | Status: DC
  Filled 2022-05-13: qty 30, 30d supply, fill #0

## 2022-05-13 MED ORDER — DORAVIRINE 100 MG PO TABS
100.0000 mg | ORAL_TABLET | Freq: Every day | ORAL | 11 refills | Status: DC
  Filled 2022-05-13 – 2022-05-21 (×2): qty 30, 30d supply, fill #0
  Filled 2022-06-11: qty 30, 30d supply, fill #1
  Filled 2022-07-11: qty 30, 30d supply, fill #2
  Filled 2022-08-11: qty 30, 30d supply, fill #3
  Filled 2022-09-11: qty 30, 30d supply, fill #4
  Filled 2022-10-08: qty 30, 30d supply, fill #5
  Filled 2022-11-11: qty 30, 30d supply, fill #6
  Filled 2022-12-12: qty 30, 30d supply, fill #7
  Filled 2023-01-09: qty 30, 30d supply, fill #8
  Filled 2023-02-05: qty 30, 30d supply, fill #9
  Filled 2023-03-04: qty 30, 30d supply, fill #10
  Filled 2023-04-08: qty 30, 30d supply, fill #11

## 2022-05-14 ENCOUNTER — Telehealth: Payer: Self-pay | Admitting: Pharmacist

## 2022-05-14 NOTE — Telephone Encounter (Signed)
Patient attempted to be outreached by Tanja Port, PharmD Candidate on 05/14/22 to discuss hypertension.   Left voicemail for patient to return our call at their convenience.    Catie Hedwig Morton, PharmD, [facility] [phone]

## 2022-05-16 ENCOUNTER — Encounter: Payer: Self-pay | Admitting: Internal Medicine

## 2022-05-19 ENCOUNTER — Other Ambulatory Visit (HOSPITAL_COMMUNITY): Payer: Self-pay

## 2022-05-21 ENCOUNTER — Other Ambulatory Visit (HOSPITAL_COMMUNITY): Payer: Self-pay

## 2022-05-22 ENCOUNTER — Other Ambulatory Visit (HOSPITAL_COMMUNITY): Payer: Self-pay

## 2022-05-28 ENCOUNTER — Telehealth: Payer: Self-pay | Admitting: Internal Medicine

## 2022-05-28 NOTE — Telephone Encounter (Signed)
I received a letter dated 05/23/2022 from an organization called [employer] out of [place].  Phone number [phone].  This letter informed me that patient was authorized to receive biopsy of enlarged lymph node of the axilla, diagnostic mammogram and ultrasound imaging with authorization window of 7/6-06/21/2022.  I spoke with someone called Maudie Mercury.M.  She informs me that they received a call from [place] last week requesting authorization for these procedures.  I informed her that patient already had the biopsy done on 04/22/2022 and from what I can see there is no plans to do further biopsy.  Patient did contact me via MyChart informing me that preauthorization was not done for the procedure and now she is being billed for it.  The person to whom I spoke to today states that she will go ahead and backdate the authorization window for the procedure and the imaging studies that she had done and we will send out a new letter.  She told me that she will also need to contact [facility] imaging to verify that they made a mistake in stating that the authorization was needed for this month through August.

## 2022-06-02 ENCOUNTER — Telehealth: Payer: Self-pay | Admitting: Pharmacist

## 2022-06-02 NOTE — Telephone Encounter (Signed)
Patient attempted to be outreached by Park Liter, PharmD Candidate on 06/02/22 to discuss hypertension. Left voicemail for patient to return our call at their convenience.   Catie Hedwig Morton, PharmD, [facility]  [phone]

## 2022-06-04 ENCOUNTER — Telehealth: Payer: Self-pay

## 2022-06-04 NOTE — Telephone Encounter (Signed)
Patient appearing on report for [employer] - Hypertension Control report due to last documented ambulatory blood pressure of 158/90 mmHg on 04/18/2022. Next appointment with PCP is 06/05/2022.   Outreached patient to discuss hypertension control and medication management. Left voicemail for patient to return my call at their convenience.   Joseph Art, Pharm.D. PGY-2 Ambulatory Care Pharmacy Resident 06/04/2022 9:43 AM

## 2022-06-05 ENCOUNTER — Other Ambulatory Visit (HOSPITAL_COMMUNITY): Payer: Self-pay

## 2022-06-05 ENCOUNTER — Encounter: Payer: Self-pay | Admitting: Internal Medicine

## 2022-06-05 ENCOUNTER — Ambulatory Visit: Payer: No Typology Code available for payment source | Attending: Internal Medicine | Admitting: Internal Medicine

## 2022-06-05 VITALS — BP 130/83 | HR 87 | Wt 219.4 lb

## 2022-06-05 DIAGNOSIS — Z5320 Procedure and treatment not carried out because of patient's decision for unspecified reasons: Secondary | ICD-10-CM

## 2022-06-05 DIAGNOSIS — E785 Hyperlipidemia, unspecified: Secondary | ICD-10-CM | POA: Diagnosis not present

## 2022-06-05 DIAGNOSIS — R87619 Unspecified abnormal cytological findings in specimens from cervix uteri: Secondary | ICD-10-CM | POA: Diagnosis not present

## 2022-06-05 DIAGNOSIS — E1169 Type 2 diabetes mellitus with other specified complication: Secondary | ICD-10-CM

## 2022-06-05 DIAGNOSIS — I10 Essential (primary) hypertension: Secondary | ICD-10-CM | POA: Diagnosis not present

## 2022-06-05 DIAGNOSIS — E669 Obesity, unspecified: Secondary | ICD-10-CM

## 2022-06-05 LAB — GLUCOSE, POCT (MANUAL RESULT ENTRY): POC Glucose: 181 mg/dl — AB (ref 70–99)

## 2022-06-05 LAB — POCT GLYCOSYLATED HEMOGLOBIN (HGB A1C): HbA1c, POC (controlled diabetic range): 8 % — AB (ref 0.0–7.0)

## 2022-06-05 MED ORDER — METFORMIN HCL 1000 MG PO TABS
1000.0000 mg | ORAL_TABLET | Freq: Two times a day (BID) | ORAL | 6 refills | Status: DC
  Filled 2022-06-05: qty 180, 90d supply, fill #0

## 2022-06-05 NOTE — Patient Instructions (Signed)
Increase metformin to 1000 mg twice a day. Please work on cutting back on the salt in the foods. Try to get in some form of moderate intensity exercise at least 3 to 5 days a week for 30 minutes.

## 2022-06-05 NOTE — Progress Notes (Signed)
Patient ID: Wanda Kane, female    DOB: [birth year]  MRN: [id number]  CC: Chronic disease management Subjective: Wanda Kane is a 61 y.o. female who presents for chronic disease management Her concerns today include:  hx of HIV (followed by ID with undetected viral load), HTN, DM, obesity, fibroid, HL,  pap with pos HPV, DVT with filter, pancreatitis (02/2019 through to be due to med [place]).   DM/obesity: Lab Results  Component Value Date   HGBA1C 8.0 (A) 06/05/2022  A1c on last visit was 12.8.  She was taking metformin inconsistently. Dose Metformin dose increased to 500 mg BID I had recommended adding evening dose of Lantus insulin but patient was reluctant.  Referred to endocrinology for opinion about placing her on Ozempic or Trulicity.  Prior history of pancreatitis thought to be due to HIV medication [place] -taking Metformin consistently.  Cut out red meat, cut back on fast food especially after seeing recent lipid profile.   Concern about lipid profile. Trying to find substitute for potatoe chips, trying not to eat late at night.  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 Trying to get in with [facility].  She has called them already  HTN: took meds already this a.m.  She is on lisinopril 10 mg, amlodipine 10 mg and spironolactone 100 mg daily. Does not check BP consistently Not limiting salt.  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  Patient states she never received a call for an appointment.  Since last visit, she had mammogram that showed a questionable mass in the right axilla.  Discovered to be in adenopathy.  Biopsy done negative for lymphoma.  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  Patient had contacted me via MyChart about receiving a bill from her insurance because preapproval was not done for the biopsy procedure.  I  told her to check with [facility] imaging of this and they would have been the one to get the prior approval. Informed her of my telephone conversation with Maudie Mercury from [place] out of [facility].  See my telephone note dated 05/28/2022.  According to the document, [employer] is the medical management company for her insurance claims.  Informed her that I received an updated letter from them dated 06/03/2022 showing a revised authorization window of 5/23 - 05/09/2022.  Also received something else from them dated 06/02/2022 with request for results of studies that were done including the results of the biopsy, mammogram and ultrasound.  I showed patient the documents today while she was here in clinic. Patient Active Problem List   Diagnosis Date Noted   Screening for cervical cancer 04/18/2022   Microalbuminuria due to type 2 diabetes mellitus ([place]) 11/04/2020   Statin declined 11/02/2020   Fatty liver 03/28/2019   Hypokalemia 03/11/2019   Pancreatitis 03/09/2019   Myositis 03/09/2019   History of DVT (deep vein thrombosis) 03/09/2019   Presence of IVC filter 03/09/2019   Leukocytosis 03/09/2019   Type 2 diabetes mellitus with hyperglycemia ([place]) 03/03/2019   New onset type 2 diabetes mellitus ([place]) 12/21/2018   Class 2 severe obesity with serious comorbidity and body mass index (BMI) of 39.0 to 39.9 in adult ([place]) 12/21/2018   Hyperlipemia, mixed 12/21/2018   Nail fungus 01/01/2018   Genital herpes 08/12/2017   Positive fecal immunochemical test 06/29/2017   Pap smear  of cervix shows high risk HPV present 06/22/2017   HIV disease ([place]) 11/22/2015   Hypertension 44/31/5400   Metabolic syndrome 86/76/1950   Cutaneous skin tags 10/26/2014   Anemia 10/12/2013   Myalgia 02/09/2013   Condyloma acuminatum 10/27/2012   Acquired acanthosis nigricans 10/27/2012   Uterine fibroid 10/27/2012   Hyperlipidemia associated with type 2 diabetes mellitus ([place]) 10/18/2012     Current Outpatient  Medications on File Prior to Visit  Medication Sig Dispense Refill   amLODipine (NORVASC) 10 MG tablet TAKE 1 TABLET BY MOUTH ONCE DAILY 90 tablet 3   B-Complex-C TABS Take 1 tablet by mouth daily.     doravirine (PIFELTRO) 100 MG TABS tablet Take 1 tablet (100 mg total) by mouth daily. 30 tablet 11   emtricitabine-tenofovir AF (DESCOVY) 200-25 MG tablet Take 1 tablet by mouth daily. 30 tablet 11   lisinopril (ZESTRIL) 10 MG tablet TAKE 1 & 1/2 TABLETS BY MOUTH ONCE A DAY 135 tablet 3   Multiple Vitamin (MULTIVITAMIN) capsule Take 1 capsule by mouth daily.     NON FORMULARY Vitamin D, pumpkin seed oil - use as directed (Patient not taking: Reported on 05/13/2022)     spironolactone (ALDACTONE) 100 MG tablet TAKE 1 TABLET BY MOUTH ONCE A DAY 90 tablet 0   No current facility-administered medications on file prior to visit.    Allergies  Allergen Reactions   Prilosec [Omeprazole] Other (See Comments)    Social History   Socioeconomic History   Marital status: Single    Spouse name: Not on file   Number of children: Not on file   Years of education: college, BSW   Highest education level: Not on file  Occupational History   Occupation: Nurse Tech    Employer: [employer]  Tobacco Use   Smoking status: Never   Smokeless tobacco: Never  Vaping Use   Vaping Use: Never used  Substance and Sexual Activity   Alcohol use: No   Drug use: No   Sexual activity: Not Currently    Partners: Male    Comment: patient declined condoms  Other Topics Concern   Not on file  Social History Narrative   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   She loves her job   Has BSW   Working towards LPN and Therapist, sports   Social Determinants of Radio broadcast assistant Strain: Low Risk  (03/09/2019)   Overall Financial Resource Strain (CARDIA)    Difficulty of Paying Living Expenses: Not hard at all  Food Insecurity: No Food Insecurity (03/09/2019)   Hunger Vital Sign    Worried About Running Out of  Food in the Last Year: Never true    [place] in the Last Year: Never true  Transportation Needs: No Transportation Needs (03/09/2019)   PRAPARE - Hydrologist (Medical): No    Lack of Transportation (Non-Medical): No  Physical Activity: Unknown (03/09/2019)   Exercise Vital Sign    Days of Exercise per Week: Patient refused    Minutes of Exercise per Session: Patient refused  Stress: Stress Concern Present (03/09/2019)   [place]    Feeling of Stress : To some extent  Social Connections: Unknown (03/09/2019)   Social Connection and Isolation Panel [NHANES]    Frequency of Communication with Friends and Family: Patient refused    Frequency of Social Gatherings with Friends and Family: Patient refused  Attends Religious Services: Patient refused    Active Member of Clubs or Organizations: Patient refused    Attends Archivist Meetings: Patient refused    Marital Status: Patient refused  Intimate Partner Violence: Not At Risk (03/09/2019)   Humiliation, Afraid, Rape, and Kick questionnaire    Fear of Current or Ex-Partner: No    Emotionally Abused: No    Physically Abused: No    Sexually Abused: No    Family History  Problem Relation Age of Onset   Hypertension Mother    CAD Brother    Heart disease Brother 67       cardiac arrest   Breast cancer Neg Hx     Past Surgical History:  Procedure Laterality Date   CESAREAN SECTION  1983   CESAREAN SECTION     TONSILLECTOMY AND ADENOIDECTOMY      ROS: Review of Systems Negative except as stated above  PHYSICAL EXAM: BP 130/83   Pulse 87   Wt 219 lb 6.4 oz (99.5 kg)   LMP 05/18/2016   SpO2 98%   BMI 37.66 kg/m   Physical Exam  General appearance - alert, well appearing, older African-American female and in no distress Mental status - normal mood, behavior, speech, dress, motor activity, and thought  processes Chest - clear to auscultation, no wheezes, rales or rhonchi, symmetric air entry Heart - normal rate, regular rhythm, normal S1, S2, no murmurs, rubs, clicks or gallops Extremities - peripheral pulses normal, no pedal edema, no clubbing or cyanosis   The 10-year ASCVD risk score (Arnett DK, et al., 2019) is: 22.3%   Values used to calculate the score:     Age: 78 years     Sex: Female     Is Non-Hispanic African American: Yes     Diabetic: Yes     Tobacco smoker: No     Systolic Blood Pressure: 267 mmHg     Is BP treated: Yes     HDL Cholesterol: 60 mg/dL     Total Cholesterol: 287 mg/dL     Latest Ref Rng & Units 04/29/2022    9:22 AM 01/21/2021    9:37 AM 01/18/2020    9:26 AM  CMP  Glucose 65 - 99 mg/dL 216  160  127   BUN 7 - 25 mg/dL '13  21  15   '$ Creatinine 0.50 - 1.05 mg/dL 0.84  0.76  0.76   Sodium 135 - 146 mmol/L 139  140  137   Potassium 3.5 - 5.3 mmol/L 4.0  4.1  4.2   Chloride 98 - 110 mmol/L 101  104  103   CO2 20 - 32 mmol/L '22  26  23   '$ Calcium 8.6 - 10.4 mg/dL 9.9  10.1  9.8   Total Protein 6.1 - 8.1 g/dL 7.5  7.0  7.3   Total Bilirubin 0.2 - 1.2 mg/dL 0.6  0.5  0.6   AST 10 - 35 U/L '20  20  25   '$ ALT 6 - 29 U/L '19  17  23    '$ Lipid Panel     Component Value Date/Time   CHOL 287 (H) 04/29/2022 0922   CHOL 253 (H) 11/16/2018 0956   TRIG 444 (H) 04/29/2022 0922   HDL 60 04/29/2022 0922   HDL 55 11/16/2018 0956   CHOLHDL 4.8 04/29/2022 0922   VLDL 24 03/07/2019 0618   LDLCALC  04/29/2022 0922     Comment:     . LDL  cholesterol not calculated. Triglyceride levels greater than 400 mg/dL invalidate calculated LDL results. . Reference range: <100 . Desirable range <100 mg/dL for primary prevention;   <70 mg/dL for patients with CHD or diabetic patients  with > or = 2 CHD risk factors. Marland Kitchen LDL-C is now calculated using the Martin-Hopkins  calculation, which is a validated novel method providing  better accuracy than the Friedewald equation in the   estimation of LDL-C.  Cresenciano Genre et al. Annamaria Helling. 8413;244(01): 2061-2068  (http://education.[facility].com/faq/FAQ164)     CBC    Component Value Date/Time   WBC 10.6 04/29/2022 0922   RBC 5.06 04/29/2022 0922   HGB 15.3 04/29/2022 0922   HGB 14.0 11/16/2018 0956   HCT 45.9 (H) 04/29/2022 0922   HCT 41.6 11/16/2018 0956   PLT 274 04/29/2022 0922   PLT 275 11/16/2018 0956   MCV 90.7 04/29/2022 0922   MCV 88 11/16/2018 0956   MCH 30.2 04/29/2022 0922   MCHC 33.3 04/29/2022 0922   RDW 12.4 04/29/2022 0922   RDW 12.5 11/16/2018 0956   LYMPHSABS 4,685 (H) 04/29/2022 0922   MONOABS 1.4 (H) 03/07/2019 0332   EOSABS 127 04/29/2022 0922   BASOSABS 32 04/29/2022 0922    ASSESSMENT AND PLAN:  1. Type 2 diabetes mellitus with obesity ([place]) Commended her on reducing her A1c by 4 points. Commended her on changes that she is made in her eating habits so far. Encouraged her to get in some form of moderate intensity exercise 3 to 5 days a week for 30 minutes Increase metformin to 1000 mg twice a day. She has an upcoming appointment with the endocrinologist Dr. Leonette Monarch in October. - POCT glucose (manual entry) - POCT glycosylated hemoglobin (Hb A1C) - metFORMIN (GLUCOPHAGE) 1000 MG tablet; Take 1 tablet (1,000 mg total) by mouth 2 (two) times daily with a meal.  Dispense: 180 tablet; Refill: 6 - Amb Ref to Medical Weight Management  2. Essential hypertension Closer to goal.  Continue current medications that include lisinopril/Norvasc/spironolactone Strongly advised to cut back on salt.  3. Hyperlipidemia associated with type 2 diabetes mellitus ([place]) Discussed recommendation for all diabetics to be on moderate intensity statins.  Even more imperative for her to be on statin given that she has other risk factors besides diabetes and ASCVD score is 25%. -Patient afraid of being on statin therapy.  She wanted to know whether there were other options.  I told her about injectables  like Repatha but usually insurances want to see that someone is either intolerant of statins or remain uncontrolled despite being on statin therapy.  She decided to hold off of being started on medication stating she wants to give herself several more months to work on her eating habits.  4. Abnormal cervical Papanicolaou smear, unspecified abnormal pap finding - Ambulatory referral to Gynecology  5.  Statin decline  I gave patient the name and phone number of the organization [employer] to find out from her insurance whether they work with this organization before I replied to the recent request for results of her mammogram, ultrasound and pathology results of biopsy from the right axilla.  Patient states she will do so and will communicate with me via MyChart.   Patient was given the opportunity to ask questions.  Patient verbalized understanding of the plan and was able to repeat key elements of the plan.   This documentation was completed using Radio producer.  Any transcriptional errors are unintentional.  Orders Placed This Encounter  Procedures   Amb Ref to Medical Weight Management   Ambulatory referral to Gynecology   POCT glucose (manual entry)   POCT glycosylated hemoglobin (Hb A1C)     Requested Prescriptions   Signed Prescriptions Disp Refills   metFORMIN (GLUCOPHAGE) 1000 MG tablet 180 tablet 6    Sig: Take 1 tablet (1,000 mg total) by mouth 2 (two) times daily with a meal.    Return in about 4 months (around 10/06/2022).  Karle Plumber, MD, FACP

## 2022-06-11 ENCOUNTER — Encounter: Payer: Self-pay | Admitting: Internal Medicine

## 2022-06-11 ENCOUNTER — Other Ambulatory Visit (HOSPITAL_COMMUNITY): Payer: Self-pay

## 2022-06-11 ENCOUNTER — Telehealth: Payer: Self-pay | Admitting: Internal Medicine

## 2022-06-11 DIAGNOSIS — Z0289 Encounter for other administrative examinations: Secondary | ICD-10-CM

## 2022-06-11 NOTE — Telephone Encounter (Signed)
Phone call placed to patient today as a follow-up on our conversation from her visit last week.  Left a message informing her that I was calling to follow-up to see whether she has touch base with her insurance as yet so that I can move forward with sending the information to [employer].  Request that she send me a MyChart message to let me know.

## 2022-06-12 ENCOUNTER — Encounter (INDEPENDENT_AMBULATORY_CARE_PROVIDER_SITE_OTHER): Payer: Self-pay | Admitting: Bariatrics

## 2022-06-16 ENCOUNTER — Encounter: Payer: Self-pay | Admitting: Internal Medicine

## 2022-06-18 ENCOUNTER — Encounter (INDEPENDENT_AMBULATORY_CARE_PROVIDER_SITE_OTHER): Payer: Self-pay | Admitting: Bariatrics

## 2022-06-18 ENCOUNTER — Ambulatory Visit (INDEPENDENT_AMBULATORY_CARE_PROVIDER_SITE_OTHER): Payer: No Typology Code available for payment source | Admitting: Bariatrics

## 2022-06-18 VITALS — BP 126/82 | HR 87 | Temp 97.8°F | Ht 63.0 in | Wt 218.0 lb

## 2022-06-18 DIAGNOSIS — I10 Essential (primary) hypertension: Secondary | ICD-10-CM

## 2022-06-18 DIAGNOSIS — K760 Fatty (change of) liver, not elsewhere classified: Secondary | ICD-10-CM | POA: Diagnosis not present

## 2022-06-18 DIAGNOSIS — R5383 Other fatigue: Secondary | ICD-10-CM | POA: Diagnosis not present

## 2022-06-18 DIAGNOSIS — Z1331 Encounter for screening for depression: Secondary | ICD-10-CM

## 2022-06-18 DIAGNOSIS — E1169 Type 2 diabetes mellitus with other specified complication: Secondary | ICD-10-CM

## 2022-06-18 DIAGNOSIS — E785 Hyperlipidemia, unspecified: Secondary | ICD-10-CM

## 2022-06-18 DIAGNOSIS — E1129 Type 2 diabetes mellitus with other diabetic kidney complication: Secondary | ICD-10-CM

## 2022-06-18 DIAGNOSIS — R0602 Shortness of breath: Secondary | ICD-10-CM

## 2022-06-18 DIAGNOSIS — Z0000 Encounter for general adult medical examination without abnormal findings: Secondary | ICD-10-CM

## 2022-06-18 DIAGNOSIS — E1165 Type 2 diabetes mellitus with hyperglycemia: Secondary | ICD-10-CM

## 2022-06-18 DIAGNOSIS — Z6838 Body mass index (BMI) 38.0-38.9, adult: Secondary | ICD-10-CM

## 2022-06-18 DIAGNOSIS — E781 Pure hyperglyceridemia: Secondary | ICD-10-CM

## 2022-06-18 DIAGNOSIS — Z7984 Long term (current) use of oral hypoglycemic drugs: Secondary | ICD-10-CM

## 2022-06-18 DIAGNOSIS — E669 Obesity, unspecified: Secondary | ICD-10-CM

## 2022-06-19 ENCOUNTER — Encounter (INDEPENDENT_AMBULATORY_CARE_PROVIDER_SITE_OTHER): Payer: Self-pay | Admitting: Bariatrics

## 2022-06-19 ENCOUNTER — Other Ambulatory Visit (HOSPITAL_COMMUNITY): Payer: Self-pay

## 2022-06-19 DIAGNOSIS — E559 Vitamin D deficiency, unspecified: Secondary | ICD-10-CM | POA: Insufficient documentation

## 2022-06-19 LAB — TSH+T4F+T3FREE
Free T4: 1.51 ng/dL (ref 0.82–1.77)
T3, Free: 3.1 pg/mL (ref 2.0–4.4)
TSH: 1.32 u[IU]/mL (ref 0.450–4.500)

## 2022-06-19 LAB — VITAMIN B12: Vitamin B-12: 861 pg/mL (ref 232–1245)

## 2022-06-19 LAB — INSULIN, RANDOM: INSULIN: 29.9 u[IU]/mL — ABNORMAL HIGH (ref 2.6–24.9)

## 2022-06-19 LAB — VITAMIN D 25 HYDROXY (VIT D DEFICIENCY, FRACTURES): Vit D, 25-Hydroxy: 26.7 ng/mL — ABNORMAL LOW (ref 30.0–100.0)

## 2022-06-20 ENCOUNTER — Ambulatory Visit: Payer: No Typology Code available for payment source | Admitting: Internal Medicine

## 2022-06-25 ENCOUNTER — Encounter (INDEPENDENT_AMBULATORY_CARE_PROVIDER_SITE_OTHER): Payer: Self-pay

## 2022-06-30 ENCOUNTER — Encounter (INDEPENDENT_AMBULATORY_CARE_PROVIDER_SITE_OTHER): Payer: Self-pay | Admitting: Bariatrics

## 2022-06-30 NOTE — Progress Notes (Signed)
Chief Complaint:   OBESITY Wanda Kane (MR# [id number]) is a 61 y.o. female who presents for evaluation and treatment of obesity and related comorbidities. Current BMI is Body mass index is 38.62 kg/m. Wanda Kane has been struggling with her weight for many years and has been unsuccessful in either losing weight, maintaining weight loss, or reaching her healthy weight goal.  Wanda Kane likes to cook, but she notes planning as an obstacle.  She craves bread and some carbohydrates.  Wanda Kane is currently in the action stage of change and ready to dedicate time achieving and maintaining a healthier weight. Wanda Kane is interested in becoming our patient and working on intensive lifestyle modifications including (but not limited to) diet and exercise for weight loss.  Wanda Kane's habits were reviewed today and are as follows: she struggles with family and or coworkers weight loss sabotage, her desired weight loss is 58 lbs, she has been heavy most of her life, she started gaining weight after her divorce, weight fluctuated, her heaviest weight ever was 230 pounds, she has significant food cravings issues, she snacks frequently in the evenings, she skips meals frequently, she is frequently drinking liquids with calories, she frequently makes poor food choices, she frequently eats larger portions than normal, she has binge eating behaviors, and she struggles with emotional eating.  Depression Screen Wanda Kane's Food and Mood (modified PHQ-9) score was 10.     06/18/2022    8:28 AM  Depression screen PHQ 2/9  Decreased Interest 1  Down, Depressed, Hopeless 2  PHQ - 2 Score 3  Altered sleeping 0  Tired, decreased energy 1  Change in appetite 2  Feeling bad or failure about yourself  1  Trouble concentrating 2  Moving slowly or fidgety/restless 1  Suicidal thoughts 0  PHQ-9 Score 10  Difficult doing work/chores Somewhat difficult   Subjective:   1. Fatigue, unspecified type Wanda Kane admits to daytime  somnolence and denies waking up still tired. Patient has a history of symptoms of daytime fatigue. Wanda Kane generally gets 7 or 8 hours of sleep per night, and states that she has generally restful sleep. Snoring is present. Apneic episodes are not present. Epworth Sleepiness Score is 7.   2. SOB (shortness of breath) on exertion Wanda Kane notes increasing shortness of breath with exercising and seems to be worsening over time with weight gain. She notes getting out of breath sooner with activity than she used to. This has not gotten worse recently. Brystol denies shortness of breath at rest or orthopnea.  3. Primary hypertension Wanda Kane is taking lisinopril, Norvasc, and spironolactone.  Her blood pressure is well controlled.  4. Fatty liver Wanda Kane history of pancreatitis.  5. Healthcare maintenance Obesity.  6. Hyperlipidemia associated with type 2 diabetes mellitus (Wanda Kane) Wanda Kane taking fish oil currently, and she declined statin.  7. Microalbuminuria due to type 2 diabetes mellitus ([place]) [name]'s microalbuminuria was last checked in March 2023.  8. Type 2 diabetes mellitus with hyperglycemia, unspecified whether long term insulin use (HCC) Wanda Kane is currently taking metformin.  9. Hypertriglyceridemia Wanda Kane not on medications currently.  Assessment/Plan:   1. Fatigue, unspecified type Wanda Kane does feel that her weight is causing her energy to be lower than it should be. Fatigue may be related to obesity, depression or many other causes. Labs will be ordered, and in the meanwhile, Wanda Kane will focus on self care including making healthy food choices, increasing physical activity and focusing on stress reduction.  - EKG 12-Lead  2. SOB (  shortness of breath) on exertion Wanda Kane does feel that she gets out of breath more easily that she used to when she exercises. Wanda Kane's shortness of breath appears to be obesity related and exercise induced. She has agreed to work on weight loss and gradually increase  exercise to treat her exercise induced shortness of breath. Will continue to monitor closely.  3. Primary hypertension Wanda Kane will continue her current medications as directed.  4. Fatty liver We will check labs today. Wanda Kane will continue to work on her meal plan and exercise.  - Vitamin B12 - Insulin, random - TSH+T4F+T3Free - VITAMIN D 25 Hydroxy (Vit-D Deficiency, Fractures)  5. Healthcare maintenance We will check labs today.  EKG and IC were done today and reviewed.  - EKG 12-Lead - Vitamin B12 - Insulin, random - TSH+T4F+T3Free - VITAMIN D 25 Hydroxy (Vit-D Deficiency, Fractures)  6. Hyperlipidemia associated with type 2 diabetes mellitus (Wanda Kane) We will check labs today. Wanda Kane will continue fish oil, and will increase MUFAs and PUFAs.  - Vitamin B12 - Insulin, random - TSH+T4F+T3Free - VITAMIN D 25 Hydroxy (Vit-D Deficiency, Fractures)  7. Microalbuminuria due to type 2 diabetes mellitus (Wanda Kane) We will check labs today, and we will continue to follow over time.  - Vitamin B12 - Insulin, random - TSH+T4F+T3Free - VITAMIN D 25 Hydroxy (Vit-D Deficiency, Fractures)  8. Type 2 diabetes mellitus with hyperglycemia, unspecified whether long term insulin use (HCC) We will check labs today, and Wanda Kane will continue metformin as directed.  - Vitamin B12 - Insulin, random - TSH+T4F+T3Free - VITAMIN D 25 Hydroxy (Vit-D Deficiency, Fractures)  9. Hypertriglyceridemia We will check labs today, and we will [facility] at [name].  - Vitamin B12 - Insulin, random - TSH+T4F+T3Free - VITAMIN D 25 Hydroxy (Vit-D Deficiency, Fractures)  10. Depression screening Wanda Kane had a positive depression screening. Depression is commonly associated with obesity and often results in emotional eating behaviors. We will monitor this closely and work on CBT to help improve the non-hunger eating patterns. Referral to Psychology may be required if no improvement is seen as she continues  in our clinic.  11. Obesity, current BMI 38.6 Wanda Kane is currently in the action stage of change and her goal is to continue with weight loss efforts. I recommend Veona begin the structured treatment plan as follows:  She has agreed to the Category 2 Plan.  Meal planning was discussed.  Review labs with the patient from 04/29/2022, CMP, lipids, CBC, and glucose, 06/05/2022 A1c.  Exercise goals: No exercise has been prescribed at this time.   Behavioral modification strategies: increasing lean protein intake, decreasing simple carbohydrates, increasing vegetables, increasing water intake, decreasing eating out, no skipping meals, meal planning and cooking strategies, keeping healthy foods in the home, and planning for success.  She was informed of the importance of frequent follow-up visits to maximize her success with intensive lifestyle modifications for her multiple health conditions. She was informed we would discuss her lab results at her next visit unless there is a critical issue that needs to be addressed sooner. Christeen agreed to keep her next visit at the agreed upon time to discuss these results.  Objective:   Blood pressure 126/82, pulse 87, temperature 97.8 F (36.6 C), height '5\' 3"'$  (1.6 m), weight 218 lb (98.9 kg), last menstrual period 05/18/2016, SpO2 94 %. Body mass index is 38.62 kg/m.  EKG: Normal sinus rhythm, rate 86 BPM.  Indirect Calorimeter completed today shows a VO2 of 258 and a  REE of 1786.  Her calculated basal metabolic rate is 0277 thus her basal metabolic rate is better than expected.  General: Cooperative, alert, well developed, in no acute distress. HEENT: Conjunctivae and lids unremarkable. Cardiovascular: Regular rhythm.  Lungs: Normal work of breathing. Neurologic: No focal deficits.   Lab Results  Component Value Date   CREATININE 0.84 04/29/2022   BUN 13 04/29/2022   NA 139 04/29/2022   K 4.0 04/29/2022   CL 101 04/29/2022   CO2 22 04/29/2022    Lab Results  Component Value Date   ALT 19 04/29/2022   AST 20 04/29/2022   ALKPHOS 92 03/11/2019   BILITOT 0.6 04/29/2022   Lab Results  Component Value Date   HGBA1C 8.0 (A) 06/05/2022   HGBA1C 12.8 (A) 01/17/2022   HGBA1C 10.6 (A) 11/02/2020   HGBA1C 8.0 (A) 09/16/2019   HGBA1C 7.3 (H) 03/10/2019   Lab Results  Component Value Date   INSULIN 29.9 (H) 06/18/2022   Lab Results  Component Value Date   TSH 1.320 06/18/2022   Lab Results  Component Value Date   CHOL 287 (H) 04/29/2022   HDL 60 04/29/2022   [place]  04/29/2022     Comment:     . LDL cholesterol not calculated. Triglyceride levels greater than 400 mg/dL invalidate calculated LDL results. . Reference range: <100 . Desirable range <100 mg/dL for primary prevention;   <70 mg/dL for patients with CHD or diabetic patients  with > or = 2 CHD risk factors. Marland Kitchen LDL-C is now calculated using the Martin-Hopkins  calculation, which is a validated novel method providing  better accuracy than the Friedewald equation in the  estimation of LDL-C.  Cresenciano Genre et al. Annamaria Helling. 4128;786(76): 2061-2068  (http://education.[facility].com/faq/FAQ164)    TRIG 444 (H) 04/29/2022   CHOLHDL 4.8 04/29/2022   Lab Results  Component Value Date   WBC 10.6 04/29/2022   HGB 15.3 04/29/2022   HCT 45.9 (H) 04/29/2022   MCV 90.7 04/29/2022   PLT 274 04/29/2022   Lab Results  Component Value Date   IRON 11 (L) 10/12/2013   TIBC 479 (H) 10/12/2013   FERRITIN 1 (L) 10/12/2013   Attestation Statements:   Reviewed by clinician on day of visit: allergies, medications, problem list, medical history, surgical history, family history, social history, and previous encounter notes.   Wilhemena Durie, am acting as Location manager for [employer], DO.  I have reviewed the above documentation for accuracy and completeness, and I agree with the above. Jearld Lesch, DO

## 2022-07-02 ENCOUNTER — Encounter (INDEPENDENT_AMBULATORY_CARE_PROVIDER_SITE_OTHER): Payer: Self-pay | Admitting: Bariatrics

## 2022-07-02 ENCOUNTER — Ambulatory Visit (INDEPENDENT_AMBULATORY_CARE_PROVIDER_SITE_OTHER): Payer: No Typology Code available for payment source | Admitting: Bariatrics

## 2022-07-02 VITALS — BP 129/84 | HR 96 | Temp 98.0°F | Ht 63.0 in | Wt 217.0 lb

## 2022-07-02 DIAGNOSIS — E1169 Type 2 diabetes mellitus with other specified complication: Secondary | ICD-10-CM | POA: Diagnosis not present

## 2022-07-02 DIAGNOSIS — Z6838 Body mass index (BMI) 38.0-38.9, adult: Secondary | ICD-10-CM

## 2022-07-02 DIAGNOSIS — E559 Vitamin D deficiency, unspecified: Secondary | ICD-10-CM | POA: Diagnosis not present

## 2022-07-02 DIAGNOSIS — E669 Obesity, unspecified: Secondary | ICD-10-CM | POA: Diagnosis not present

## 2022-07-02 DIAGNOSIS — Z7984 Long term (current) use of oral hypoglycemic drugs: Secondary | ICD-10-CM

## 2022-07-03 ENCOUNTER — Encounter: Payer: Self-pay | Admitting: Internal Medicine

## 2022-07-03 DIAGNOSIS — Z91018 Allergy to other foods: Secondary | ICD-10-CM

## 2022-07-07 ENCOUNTER — Other Ambulatory Visit (HOSPITAL_COMMUNITY): Payer: Self-pay

## 2022-07-07 ENCOUNTER — Encounter: Payer: Self-pay | Admitting: Internal Medicine

## 2022-07-11 ENCOUNTER — Other Ambulatory Visit (HOSPITAL_COMMUNITY): Payer: Self-pay

## 2022-07-12 NOTE — Progress Notes (Unsigned)
Chief Complaint:   OBESITY Wanda Kane is here to discuss her progress with her obesity treatment plan along with follow-up of her obesity related diagnoses. Wanda Kane is on the Category 2 Plan and states she is following her eating plan approximately 90% of the time. Wanda Kane states she is doing 0 minutes 0 times per week.  Today's visit was #: 2 Starting weight: 218 lbs Starting date: 06/18/2022 Today's weight: 217 lbs Today's date: 07/02/2022 Total lbs lost to date: 1 Total lbs lost since last in-office visit: 1  Interim History: Wanda Kane is down 1 lb since her first visit. She is doing time restricted fasting (R. 2-3 pm-8-9 pm).   Subjective:   1. Diabetes mellitus type 2 in obese (HCC) Wanda Kane is taking metformin, and it helps with insulin resistance. Her A1c is 8.0 and insulin is 29.9. She denies low blood sugars.   2. Vitamin D insufficiency Wanda Kane is taking Vitamin D 5,000 IU daily. Her Vitamin D level is 26.7.  Assessment/Plan:   1. Diabetes mellitus type 2 in obese (HCC) Wanda Kane was given handouts on type 2 diabetes mellitus and insulin resistance.   2. Vitamin D insufficiency Wanda Kane will start the Vitamin D 50,000 IU weekly, as she has an order on [employer].   3. Obesity, current BMI 38.5 Wanda Kane is currently in the action stage of change. As such, her goal is to continue with weight loss efforts. She has agreed to keeping a food journal and adhering to recommended goals of 1200 calories and 80 grams of protein daily.   Meal planning was discussed. Reviewed labs with the patient from 06/18/2022, Vit D, B12, insulin, and thyroid panel.   Exercise goals: Cardio and resistance.   Behavioral modification strategies: increasing lean protein intake, decreasing simple carbohydrates, increasing vegetables, increasing water intake, decreasing eating out, no skipping meals, meal planning and cooking strategies, keeping healthy foods in the home, and planning for success.  Wanda Kane has agreed to  follow-up with our clinic in 2 to 3 weeks. She was informed of the importance of frequent follow-up visits to maximize her success with intensive lifestyle modifications for her multiple health conditions.   Objective:   Blood pressure 129/84, pulse 96, temperature 98 F (36.7 C), height '5\' 3"'$  (1.6 m), weight 217 lb (98.4 kg), last menstrual period 05/18/2016, SpO2 96 %. Body mass index is 38.44 kg/m.  General: Cooperative, alert, well developed, in no acute distress. HEENT: Conjunctivae and lids unremarkable. Cardiovascular: Regular rhythm.  Lungs: Normal work of breathing. Neurologic: No focal deficits.   Lab Results  Component Value Date   CREATININE 0.84 04/29/2022   BUN 13 04/29/2022   NA 139 04/29/2022   K 4.0 04/29/2022   CL 101 04/29/2022   CO2 22 04/29/2022   Lab Results  Component Value Date   ALT 19 04/29/2022   AST 20 04/29/2022   ALKPHOS 92 03/11/2019   BILITOT 0.6 04/29/2022   Lab Results  Component Value Date   HGBA1C 8.0 (A) 06/05/2022   HGBA1C 12.8 (A) 01/17/2022   HGBA1C 10.6 (A) 11/02/2020   HGBA1C 8.0 (A) 09/16/2019   HGBA1C 7.3 (H) 03/10/2019   Lab Results  Component Value Date   INSULIN 29.9 (H) 06/18/2022   Lab Results  Component Value Date   TSH 1.320 06/18/2022   Lab Results  Component Value Date   CHOL 287 (H) 04/29/2022   HDL 60 04/29/2022   [place]  04/29/2022     Comment:     .  LDL cholesterol not calculated. Triglyceride levels greater than 400 mg/dL invalidate calculated LDL results. . Reference range: <100 . Desirable range <100 mg/dL for primary prevention;   <70 mg/dL for patients with CHD or diabetic patients  with > or = 2 CHD risk factors. Marland Kitchen LDL-C is now calculated using the Martin-Hopkins  calculation, which is a validated novel method providing  better accuracy than the Friedewald equation in the  estimation of LDL-C.  Cresenciano Genre et al. Annamaria Helling. 1287;867(67): 2061-2068   (http://education.[facility].com/faq/FAQ164)    TRIG 444 (H) 04/29/2022   CHOLHDL 4.8 04/29/2022   Lab Results  Component Value Date   VD25OH 26.7 (L) 06/18/2022   Lab Results  Component Value Date   WBC 10.6 04/29/2022   HGB 15.3 04/29/2022   HCT 45.9 (H) 04/29/2022   MCV 90.7 04/29/2022   PLT 274 04/29/2022   Lab Results  Component Value Date   IRON 11 (L) 10/12/2013   TIBC 479 (H) 10/12/2013   FERRITIN 1 (L) 10/12/2013   Attestation Statements:   Reviewed by clinician on day of visit: allergies, medications, problem list, medical history, surgical history, family history, social history, and previous encounter notes.   Wilhemena Durie, am acting as Location manager for [employer], DO.  I have reviewed the above documentation for accuracy and completeness, and I agree with the above. Jearld Lesch, DO

## 2022-07-14 ENCOUNTER — Encounter (INDEPENDENT_AMBULATORY_CARE_PROVIDER_SITE_OTHER): Payer: Self-pay | Admitting: Bariatrics

## 2022-07-18 ENCOUNTER — Other Ambulatory Visit (HOSPITAL_COMMUNITY): Payer: Self-pay

## 2022-07-29 ENCOUNTER — Ambulatory Visit (INDEPENDENT_AMBULATORY_CARE_PROVIDER_SITE_OTHER): Payer: No Typology Code available for payment source | Admitting: Family Medicine

## 2022-08-07 NOTE — Progress Notes (Deleted)
    GYNECOLOGY OFFICE COLPOSCOPY PROCEDURE NOTE  61 y.o. 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  Of note for pap history, she does have HIV.   Pregnancy test:  not indicated Gardasil:  N/A  Informed consent and review of risks, benefit and alternatives performed. Written consent given.   Speculum inserted into patient's vagina assuring full view of cervix and vaginal walls. 3 swabs of vinegar solution applied to the cervix and vaginal walls and colposcope was used to observe both the cervix and vaginal walls.   Colposcopy adequate? {yes/no:20286}  {Findings; colposcopy:728}; corresponding biopsies obtained.  ECC collected.  All specimens were labeled and sent to pathology.  Monsel's applied to biopsy sites for good hemostasis and speculum removed.  Pt tolerated well with minimal pain and bleeding.   Patient was given post procedure instructions.  Will follow up pathology and manage accordingly; patient will be contacted with results and recommendations.  Routine preventative health maintenance measures emphasized.    Radene Gunning, MD, [place] for [facility], [place]

## 2022-08-11 ENCOUNTER — Other Ambulatory Visit: Payer: Self-pay | Admitting: Internal Medicine

## 2022-08-11 ENCOUNTER — Encounter: Payer: No Typology Code available for payment source | Admitting: Obstetrics and Gynecology

## 2022-08-11 ENCOUNTER — Other Ambulatory Visit (HOSPITAL_COMMUNITY): Payer: Self-pay

## 2022-08-11 DIAGNOSIS — R87810 Cervical high risk human papillomavirus (HPV) DNA test positive: Secondary | ICD-10-CM

## 2022-08-11 DIAGNOSIS — I10 Essential (primary) hypertension: Secondary | ICD-10-CM

## 2022-08-12 ENCOUNTER — Other Ambulatory Visit (HOSPITAL_COMMUNITY): Payer: Self-pay

## 2022-08-12 MED ORDER — SPIRONOLACTONE 100 MG PO TABS
100.0000 mg | ORAL_TABLET | Freq: Every day | ORAL | 0 refills | Status: DC
  Filled 2022-08-12: qty 90, 90d supply, fill #0

## 2022-08-12 NOTE — Telephone Encounter (Signed)
Requested Prescriptions  Pending Prescriptions Disp Refills  . spironolactone (ALDACTONE) 100 MG tablet 90 tablet 0    Sig: TAKE 1 TABLET BY MOUTH ONCE A DAY     Cardiovascular: Diuretics - Aldosterone Antagonist Passed - 08/11/2022  1:48 PM      Passed - Cr in normal range and within 180 days    Creat  Date Value Ref Range Status  04/29/2022 0.84 0.50 - 1.05 mg/dL Final         Passed - K in normal range and within 180 days    Potassium  Date Value Ref Range Status  04/29/2022 4.0 3.5 - 5.3 mmol/L Final         Passed - Na in normal range and within 180 days    Sodium  Date Value Ref Range Status  04/29/2022 139 135 - 146 mmol/L Final  11/16/2018 144 134 - 144 mmol/L Final         Passed - eGFR is 30 or above and within 180 days    GFR, Est African American  Date Value Ref Range Status  01/21/2021 99 > OR = 60 mL/min/1.23m Final   GFR, Est Non African American  Date Value Ref Range Status  01/21/2021 85 > OR = 60 mL/min/1.750mFinal   eGFR  Date Value Ref Range Status  04/29/2022 79 > OR = 60 mL/min/1.7336minal    Comment:    The eGFR is based on the CKD-EPI 2021 equation. To calculate  the new eGFR from a previous Creatinine or Cystatin C result, go to https://www.kidney.org/professionals/ kdoqi/gfr%5Fcalculator          Passed - Last BP in normal range    BP Readings from Last 1 Encounters:  07/02/22 129/84         Passed - Valid encounter within last 6 months    Recent Outpatient Visits          2 months ago Type 2 diabetes mellitus with [place] ([place] [facility] City[place]le Plumber [facility]   6 months ago [facility] Con[facility]by[facility]orah B, MD   [facility]ah B[facility] MD   1 year ago Abscess, axilla   ConReddickeborah  B, MD   1 year ago Type [facility] morbid [facility] ConGraftonteJarome MatinPH-CPP      Future Appointments            In 1 month JohWynetta EmerybDalbert BatmanD ConCeruleanIn 7 months VanTommy MedalorLavell IslamD [facility], [facility]

## 2022-08-19 ENCOUNTER — Other Ambulatory Visit (HOSPITAL_COMMUNITY): Payer: Self-pay

## 2022-08-19 NOTE — Progress Notes (Deleted)
New Patient Note  RE: Wanda Kane MRN: [id number] DOB: Mar 11, 1961 Date of Office Visit: 08/20/2022  Consult requested by: Elsie Stain, MD Primary care provider: [name], MD  Chief Complaint: No chief complaint on file.  History of Present Illness: I had the pleasure of seeing Damica Gravlin for initial evaluation at the [facility] on 08/19/2022. She is a 61 y.o. female, who is referred here by [name], MD for the evaluation of food allergy.  She reports food allergy to ***. The reaction occurred at the age of ***, after she ate *** amount of ***. Symptoms started within *** and was in the form of *** hives, swelling, wheezing, abdominal pain, diarrhea, vomiting. ***Denies any associated cofactors such as exertion, infection, NSAID use, or alcohol consumption. The symptoms lasted for ***. She was evaluated in ED and received ***. Since this episode, she does *** not report other accidental exposures to ***. She does *** not have access to epinephrine autoinjector and *** needed to use it.   Past work up includes: ***. Dietary History: patient has been eating other foods including ***milk, ***eggs, ***peanut, ***treenuts, ***sesame, ***shellfish, ***fish, ***soy, ***wheat, ***meats, ***fruits and ***vegetables.  She reports reading labels and avoiding *** in diet completely. She tolerates ***baked egg and baked milk products.   Assessment and Plan: Agatha is a 61 y.o. female with: No problem-specific Assessment & Plan notes found for this encounter.  No follow-ups on file.  No orders of the defined types were placed in this encounter.  Lab Orders  No laboratory test(s) ordered today    Other allergy screening: Asthma: {Blank single:19197::"yes","no"} Rhino conjunctivitis: {Blank single:19197::"yes","no"} Food allergy: {Blank single:19197::"yes","no"} Medication allergy: {Blank single:19197::"yes","no"} Hymenoptera allergy:  {Blank single:19197::"yes","no"} Urticaria: {Blank single:19197::"yes","no"} Eczema:{Blank single:19197::"yes","no"} History of recurrent infections suggestive of immunodeficency: {Blank single:19197::"yes","no"}  Diagnostics: Spirometry:  Tracings reviewed. Her effort: {Blank single:19197::"Good reproducible efforts.","It was hard to get consistent efforts and there is a question as to whether this reflects a maximal maneuver.","Poor effort, data can not be interpreted."} FVC: ***L FEV1: ***L, ***% predicted FEV1/FVC ratio: ***% Interpretation: {Blank single:19197::"Spirometry consistent with mild obstructive disease","Spirometry consistent with moderate obstructive disease","Spirometry consistent with severe obstructive disease","Spirometry consistent with possible restrictive disease","Spirometry consistent with mixed obstructive and restrictive disease","Spirometry uninterpretable due to technique","Spirometry consistent with normal pattern","No overt abnormalities noted given today's efforts"}.  Please see scanned spirometry results for details.  Skin Testing: {Blank single:19197::"Select foods","Environmental allergy panel","Environmental allergy panel and select foods","Food allergy panel","None","Deferred due to recent antihistamines use"}. *** Results discussed with patient/family.   Past Medical History: Patient Active Problem List   Diagnosis Date Noted  . Diabetes mellitus type 2 in obese ([place]) 07/02/2022  . Class 2 severe obesity with serious comorbidity and body mass index (BMI) of 38.0 to 38.9 in adult ([place]) 07/02/2022  . Vitamin D deficiency 06/19/2022  . Microalbuminuria due to type 2 diabetes mellitus ([place]) 11/04/2020  . Statin declined 11/02/2020  . Fatty liver 03/28/2019  . History of DVT (deep vein thrombosis) 03/09/2019  . Presence of IVC filter 03/09/2019  . Type 2 diabetes mellitus with hyperglycemia ([place]) 03/03/2019  . Hyperlipemia, mixed 12/21/2018  . Genital  herpes 08/12/2017  . Pap smear of cervix shows high risk HPV present 06/22/2017  . HIV disease ([place]) 11/22/2015  . Hypertension 12/29/2014  . Anemia 10/12/2013  . Condyloma acuminatum 10/27/2012  . Acquired acanthosis nigricans 10/27/2012  . Uterine fibroid 10/27/2012  . Hyperlipidemia associated with type 2  diabetes mellitus ([place]) 10/18/2012   Past Medical History:  Diagnosis Date  . Anxiety   . Depression   . DVT (deep venous thrombosis) ([place]) 11/2014  . Fatty liver   . Fibroids 2010  . Genital herpes 08/12/2017  . High cholesterol   . HIV infection ([place]) 1994  . Hypertension 1994  . Medical history non-contributory   . Microalbuminuria due to type 2 diabetes mellitus ([place]) 11/04/2020  . 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 Pancreatitis 03/09/2019  . Pancreatitis   . Pre-diabetes   . Type 2 diabetes mellitus with hyperglycemia ([place]) 03/03/2019   diagnosed in December 2019  . Vaginal bleeding 12/2014   due to xarelto   Past Surgical History: Past Surgical History:  Procedure Laterality Date  . CESAREAN SECTION  11/17/1981  . CESAREAN SECTION    . IVC FILTER PLACEMENT ([facility] HX)    . TONSILLECTOMY AND ADENOIDECTOMY     Medication List:  Current Outpatient Medications  Medication Sig Dispense Refill  . amLODipine (NORVASC) 10 MG tablet TAKE 1 TABLET BY MOUTH ONCE DAILY 90 tablet 3  . B-Complex-C TABS Take 1 tablet by mouth daily.    . doravirine (PIFELTRO) 100 MG TABS tablet Take 1 tablet (100 mg total) by mouth daily. 30 tablet 11  . emtricitabine-tenofovir AF (DESCOVY) 200-25 MG tablet Take 1 tablet by mouth daily. 30 tablet 11  . lisinopril (ZESTRIL) 10 MG tablet TAKE 1 & 1/2 TABLETS BY MOUTH ONCE A DAY (Patient taking differently: Take 10 mg by mouth daily.) 135 tablet 3  . metFORMIN (GLUCOPHAGE) 1000 MG tablet Take 1 tablet (1,000 mg total) by mouth 2 (two) times daily with a meal. 180 tablet 6  . Multiple Vitamin (MULTIVITAMIN) capsule Take 1 capsule by mouth daily.    Marland Kitchen  spironolactone (ALDACTONE) 100 MG tablet Take 1 tablet (100 mg total) by mouth daily. 90 tablet 0   No current facility-administered medications for this visit.   Allergies: Allergies  Allergen Reactions  . Prilosec [Omeprazole] Other (See Comments)   Social History: Social History   Socioeconomic History  . Marital status: Single    Spouse name: Not on file  . Number of children: Not on file  . Years of education: college, [place]  . Highest education level: Not on file  Occupational History  . Occupation: Research scientist (life sciences): [employer]  Tobacco Use  . Smoking status: Never  . Smokeless tobacco: Never  Vaping Use  . Vaping Use: Never used  Substance and Sexual Activity  . Alcohol use: No  . Drug use: No  . Sexual activity: Not Currently    Partners: Male    Comment: patient declined condoms  Other Topics Concern  . 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   She loves her job   Has BSW   Working towards Research officer, political party   Social Determinants of Health   Financial Resource Strain: Low Risk  (03/09/2019)   Overall Financial Resource Strain (CARDIA)   . Difficulty of Paying Living Expenses: Not hard at all  Food Insecurity: No Food Insecurity (03/09/2019)   Hunger Vital Sign   . Worried About Charity fundraiser in the Last Year: Never true   . Ran Out of Food in the Last Year: Never true  Transportation Needs: No Transportation Needs (03/09/2019)   PRAPARE - Transportation   . Lack of Transportation (Medical): No   . Lack  of Transportation (Non-Medical): No  Physical Activity: Unknown (03/09/2019)   Exercise Vital Sign   . Days of Exercise per Week: Patient refused   . Minutes of Exercise per Session: Patient refused  Stress: Stress Concern Present (03/09/2019)   [place]   . Feeling of Stress : To some extent  Social Connections: Unknown (03/09/2019)   Social  Connection and Isolation Panel [NHANES]   . Frequency of Communication with Friends and Family: Patient refused   . Frequency of Social Gatherings with Friends and Family: Patient refused   . Attends Religious Services: Patient refused   . Active Member of Clubs or Organizations: Patient refused   . Attends Archivist Meetings: Patient refused   . Marital Status: Patient refused   Lives in a ***. Smoking: *** Occupation: ***  Environmental HistoryFreight forwarder in the house: Estate agent in the family room: {Blank single:19197::"yes","no"} Carpet in the bedroom: {Blank single:19197::"yes","no"} Heating: {Blank single:19197::"electric","gas","heat pump"} Cooling: {Blank single:19197::"central","window","heat pump"} Pet: {Blank single:19197::"yes ***","no"}  Family History: Family History  Problem Relation Age of Onset  . Hypertension Mother   . Hyperlipidemia Father   . Hypertension Father   . CAD Brother   . Heart disease Brother 41       cardiac arrest  . Breast cancer Neg Hx    Problem                               Relation Asthma                                   *** Eczema                                *** Food allergy                          *** Allergic rhino conjunctivitis     ***  Review of Systems  Constitutional:  Negative for appetite change, chills, fever and unexpected weight change.  HENT:  Negative for congestion and rhinorrhea.   Eyes:  Negative for itching.  Respiratory:  Negative for cough, chest tightness, shortness of breath and wheezing.   Cardiovascular:  Negative for chest pain.  Gastrointestinal:  Negative for abdominal pain.  Genitourinary:  Negative for difficulty urinating.  Skin:  Negative for rash.  Neurological:  Negative for headaches.   Objective: LMP 05/18/2016  There is no height or weight on file to calculate BMI. Physical Exam Vitals and nursing note reviewed.  Constitutional:       Appearance: Normal appearance. She is well-developed.  HENT:     Head: Normocephalic and atraumatic.     Right Ear: Tympanic membrane and external ear normal.     Left Ear: Tympanic membrane and external ear normal.     Nose: Nose normal.     Mouth/Throat:     Mouth: Mucous membranes are moist.     Pharynx: Oropharynx is clear.  Eyes:     Conjunctiva/sclera: Conjunctivae normal.  Cardiovascular:     Rate and Rhythm: Normal rate and regular rhythm.     Heart sounds: Normal heart sounds. No murmur heard.    No friction rub. No gallop.  Pulmonary:  Effort: Pulmonary effort is normal.     Breath sounds: Normal breath sounds. No wheezing, rhonchi or rales.  Musculoskeletal:     Cervical back: Neck supple.  Skin:    General: Skin is warm.     Findings: No rash.  Neurological:     Mental Status: She is alert and oriented to person, place, and time.  Psychiatric:        Behavior: Behavior normal.  The plan was reviewed with the patient/family, and all questions/concerned were addressed.  It was my pleasure to see Rayma today and participate in her care. Please feel free to contact me with any questions or concerns.  Sincerely,  Rexene Alberts, DO Allergy & Immunology  [facility] office: [place] office: [phone]

## 2022-08-20 ENCOUNTER — Ambulatory Visit: Payer: No Typology Code available for payment source | Admitting: Allergy

## 2022-09-03 ENCOUNTER — Ambulatory Visit: Payer: No Typology Code available for payment source | Admitting: Internal Medicine

## 2022-09-09 ENCOUNTER — Other Ambulatory Visit (HOSPITAL_COMMUNITY): Payer: Self-pay

## 2022-09-11 ENCOUNTER — Other Ambulatory Visit (HOSPITAL_COMMUNITY): Payer: Self-pay

## 2022-09-15 ENCOUNTER — Other Ambulatory Visit (HOSPITAL_COMMUNITY): Payer: Self-pay

## 2022-10-06 ENCOUNTER — Ambulatory Visit: Payer: No Typology Code available for payment source | Admitting: Internal Medicine

## 2022-10-08 ENCOUNTER — Other Ambulatory Visit (HOSPITAL_COMMUNITY): Payer: Self-pay

## 2022-10-20 ENCOUNTER — Other Ambulatory Visit (HOSPITAL_COMMUNITY): Payer: Self-pay

## 2022-11-11 ENCOUNTER — Other Ambulatory Visit: Payer: Self-pay | Admitting: Internal Medicine

## 2022-11-11 ENCOUNTER — Other Ambulatory Visit (HOSPITAL_COMMUNITY): Payer: Self-pay

## 2022-11-11 DIAGNOSIS — I10 Essential (primary) hypertension: Secondary | ICD-10-CM

## 2022-11-11 DIAGNOSIS — E1169 Type 2 diabetes mellitus with other specified complication: Secondary | ICD-10-CM

## 2022-11-12 ENCOUNTER — Other Ambulatory Visit: Payer: Self-pay

## 2022-11-12 ENCOUNTER — Other Ambulatory Visit (HOSPITAL_COMMUNITY): Payer: Self-pay

## 2022-11-12 MED ORDER — METFORMIN HCL 1000 MG PO TABS
1000.0000 mg | ORAL_TABLET | Freq: Two times a day (BID) | ORAL | 0 refills | Status: DC
  Filled 2022-11-12: qty 180, 90d supply, fill #0

## 2022-11-12 MED ORDER — SPIRONOLACTONE 100 MG PO TABS
100.0000 mg | ORAL_TABLET | Freq: Every day | ORAL | 0 refills | Status: DC
  Filled 2022-11-12: qty 90, 90d supply, fill #0

## 2022-11-13 ENCOUNTER — Other Ambulatory Visit: Payer: Self-pay

## 2022-11-25 ENCOUNTER — Ambulatory Visit: Payer: No Typology Code available for payment source | Admitting: Internal Medicine

## 2022-12-09 ENCOUNTER — Other Ambulatory Visit (HOSPITAL_COMMUNITY): Payer: Self-pay

## 2022-12-11 ENCOUNTER — Other Ambulatory Visit (HOSPITAL_COMMUNITY): Payer: Self-pay

## 2022-12-12 ENCOUNTER — Other Ambulatory Visit (HOSPITAL_COMMUNITY): Payer: Self-pay

## 2022-12-16 ENCOUNTER — Telehealth: Payer: Self-pay

## 2022-12-16 ENCOUNTER — Other Ambulatory Visit (HOSPITAL_COMMUNITY): Payer: Self-pay

## 2022-12-16 NOTE — Telephone Encounter (Signed)
[facility] Patient Advocate Encounter   Was successful in obtaining a [employer] copay card for [employer].  This copay card will make the patients copay $0.00.  I have spoken with the patient.    The billing information is as follows and has been shared with [facility].  RxBIN: [id number] RxPCN: Loyalty RxGrp: [id number] ISSUER: [phone]) ID: [id number] Expiration Date: 11/17/2023       Wanda Kane, [place] Patient [place] [facility] Phone: [phone] Fax:  [phone]

## 2022-12-23 ENCOUNTER — Encounter: Payer: No Typology Code available for payment source | Admitting: Obstetrics and Gynecology

## 2023-01-09 ENCOUNTER — Other Ambulatory Visit (HOSPITAL_COMMUNITY): Payer: Self-pay

## 2023-01-12 ENCOUNTER — Other Ambulatory Visit: Payer: Self-pay

## 2023-01-20 ENCOUNTER — Other Ambulatory Visit (HOSPITAL_COMMUNITY): Payer: Self-pay

## 2023-01-20 MED ORDER — MINOXIDIL 2.5 MG PO TABS
1.2500 mg | ORAL_TABLET | Freq: Every day | ORAL | 3 refills | Status: DC
  Filled 2023-01-20: qty 45, 90d supply, fill #0

## 2023-01-20 MED ORDER — FLUOCINOLONE ACETONIDE BODY 0.01 % EX OIL
TOPICAL_OIL | CUTANEOUS | 11 refills | Status: DC
  Filled 2023-01-20: qty 118.28, 30d supply, fill #0

## 2023-01-21 ENCOUNTER — Ambulatory Visit: Payer: No Typology Code available for payment source | Admitting: Allergy

## 2023-01-21 ENCOUNTER — Other Ambulatory Visit (HOSPITAL_COMMUNITY): Payer: Self-pay

## 2023-01-21 ENCOUNTER — Other Ambulatory Visit: Payer: Self-pay

## 2023-01-21 NOTE — Progress Notes (Signed)
    GYNECOLOGY OFFICE COLPOSCOPY PROCEDURE NOTE  62 y.o. G1P1001 here for colposcopy:  HIV pos 04/2022: Normal pap, HPV pos, non16/18 04/2021: same as 2023 2020: ASCUS/HPV pos (non 16/18) 2018: HPV pos, normal pap   Pregnancy test:  not indicated  Informed consent and review of risks, benefit and alternatives performed. Written consent given.   Speculum inserted into patient's vagina assuring full view of cervix and vaginal walls. 3 swabs of vinegar solution applied to the cervix and vaginal walls and colposcope was used to observe both the cervix and vaginal walls.   Prior to colposcopy, pap with HPV collected.  Colposcopy adequate? No - could not see full transformation zone.   no visible lesions, no mosaicism, no punctation, and no abnormal vasculature; corresponding biopsies obtained.  ECC collected.  All specimens were labeled and sent to pathology.  Monsel's applied to biopsy sites for good hemostasis and speculum removed.  Pt tolerated well with minimal pain and bleeding.   Patient was given post procedure instructions.  Will follow up pathology and manage accordingly; patient will be contacted with results and recommendations.  Routine preventative health maintenance measures emphasized.    Radene Gunning, MD, [place] for [facility] [place]

## 2023-01-23 ENCOUNTER — Ambulatory Visit: Payer: Self-pay | Admitting: Internal Medicine

## 2023-01-26 ENCOUNTER — Encounter: Payer: Self-pay | Admitting: Obstetrics and Gynecology

## 2023-01-26 ENCOUNTER — Other Ambulatory Visit (HOSPITAL_COMMUNITY)
Admission: RE | Admit: 2023-01-26 | Discharge: 2023-01-26 | Disposition: A | Discharge status: Home / Self Care | Payer: 59 | Source: Ambulatory Visit | Attending: Obstetrics and Gynecology | Admitting: Obstetrics and Gynecology

## 2023-01-26 ENCOUNTER — Ambulatory Visit: Payer: 59 | Admitting: Obstetrics and Gynecology

## 2023-01-26 ENCOUNTER — Other Ambulatory Visit: Payer: Self-pay

## 2023-01-26 VITALS — BP 133/85 | HR 95 | Ht 65.0 in | Wt 219.0 lb

## 2023-01-26 DIAGNOSIS — R87619 Unspecified abnormal cytological findings in specimens from cervix uteri: Secondary | ICD-10-CM | POA: Insufficient documentation

## 2023-01-27 ENCOUNTER — Telehealth: Payer: Self-pay | Admitting: *Deleted

## 2023-01-27 NOTE — Telephone Encounter (Signed)
Received a call from [place] at pathology to confirm specimens they received yesterday should have been 1 or 2. I discussed with Dr. Damita Dunnings and she confirmed she did colposcopy and only did ECC, no biopsies and papsmear. I informed Christy pathology was only ECC and that she had pap as well.  Staci Acosta

## 2023-01-28 LAB — SURGICAL PATHOLOGY

## 2023-01-29 LAB — CYTOLOGY - PAP
Comment: NEGATIVE
Diagnosis: NEGATIVE
Diagnosis: REACTIVE
High risk HPV: NEGATIVE

## 2023-02-02 ENCOUNTER — Other Ambulatory Visit: Payer: Self-pay | Admitting: Internal Medicine

## 2023-02-02 ENCOUNTER — Other Ambulatory Visit (HOSPITAL_COMMUNITY): Payer: Self-pay

## 2023-02-02 ENCOUNTER — Other Ambulatory Visit: Payer: Self-pay

## 2023-02-02 DIAGNOSIS — I10 Essential (primary) hypertension: Secondary | ICD-10-CM

## 2023-02-02 MED ORDER — LISINOPRIL 10 MG PO TABS
15.0000 mg | ORAL_TABLET | Freq: Every day | ORAL | 0 refills | Status: DC
  Filled 2023-02-02 (×2): qty 135, 90d supply, fill #0

## 2023-02-02 MED ORDER — SPIRONOLACTONE 100 MG PO TABS
100.0000 mg | ORAL_TABLET | Freq: Every day | ORAL | 0 refills | Status: DC
  Filled 2023-02-02 (×2): qty 90, 90d supply, fill #0

## 2023-02-02 MED ORDER — AMLODIPINE BESYLATE 10 MG PO TABS
10.0000 mg | ORAL_TABLET | Freq: Every day | ORAL | 0 refills | Status: DC
  Filled 2023-02-02 (×2): qty 90, 90d supply, fill #0

## 2023-02-04 ENCOUNTER — Encounter: Payer: Self-pay | Admitting: Obstetrics and Gynecology

## 2023-02-05 ENCOUNTER — Other Ambulatory Visit (HOSPITAL_COMMUNITY): Payer: Self-pay

## 2023-02-06 ENCOUNTER — Other Ambulatory Visit: Payer: Self-pay

## 2023-02-24 ENCOUNTER — Other Ambulatory Visit: Payer: Self-pay | Admitting: Internal Medicine

## 2023-02-24 DIAGNOSIS — Z0000 Encounter for general adult medical examination without abnormal findings: Secondary | ICD-10-CM

## 2023-03-02 IMAGING — US US AXILLARY NODE CORE BIOPSY RIGHT
1 series · 12 of 12 positions shown · non-contrast
Comparison: None Available.
COMPARISON: None Available.

Addendum:
CLINICAL DATA: 1-year-old female presenting for ultrasound-guided
biopsy of a right axillary lymph node.

EXAM:
US AXILLARY NODE CORE BIOPSY RIGHT

[Series 1: us axillary node core biopsy right · 0.08mm/px · 12 of 12 slices shown]
[im 1/12]
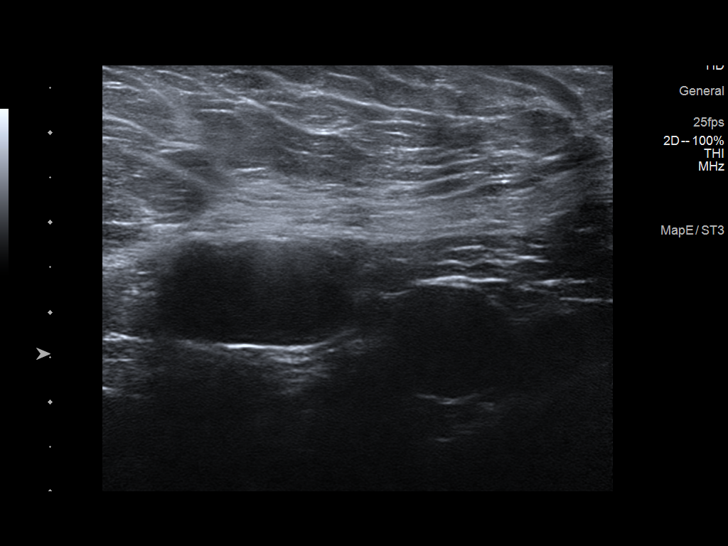
[im 2/12]
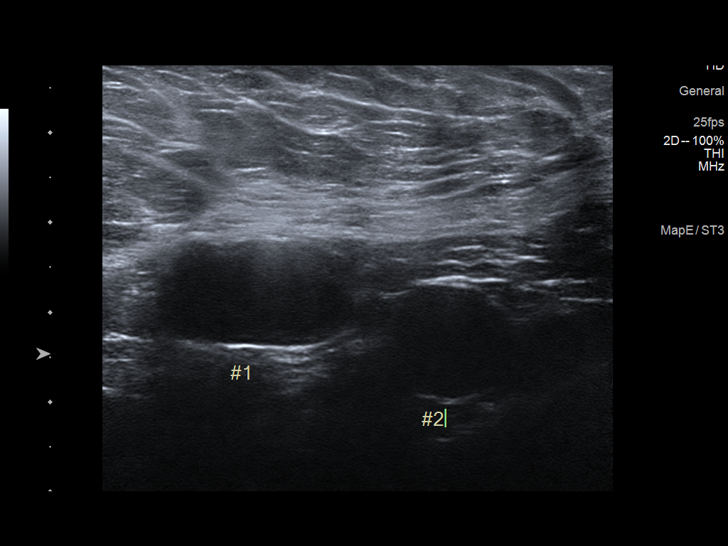
[im 3/12]
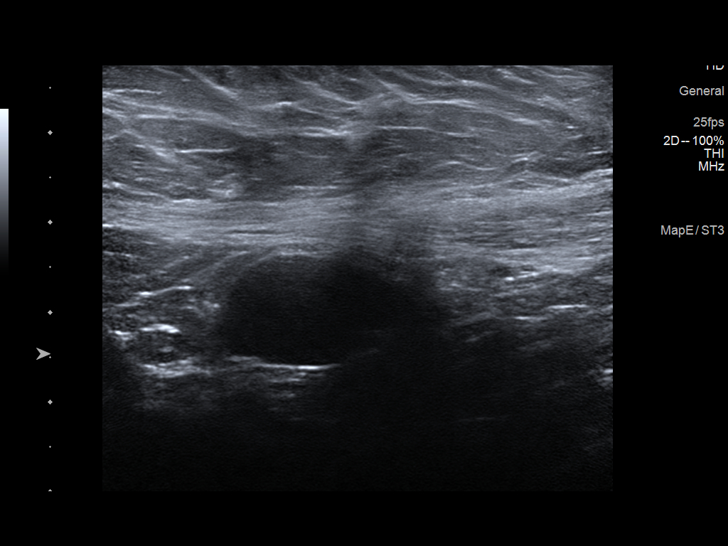
[im 4/12]
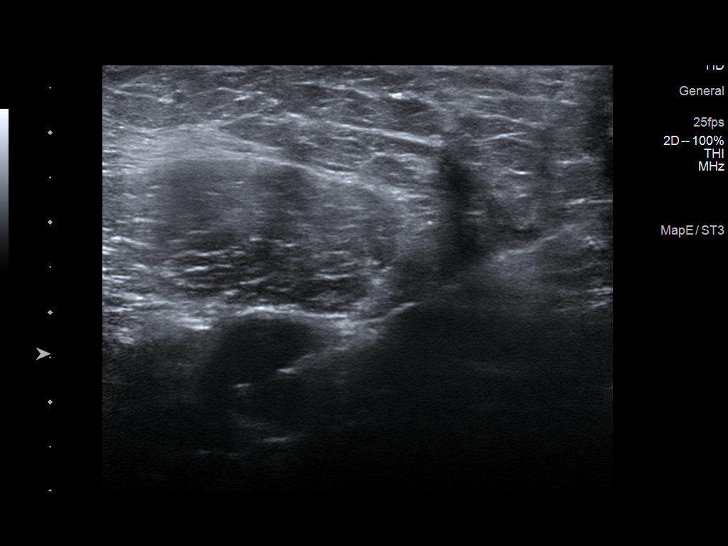
[im 5/12]
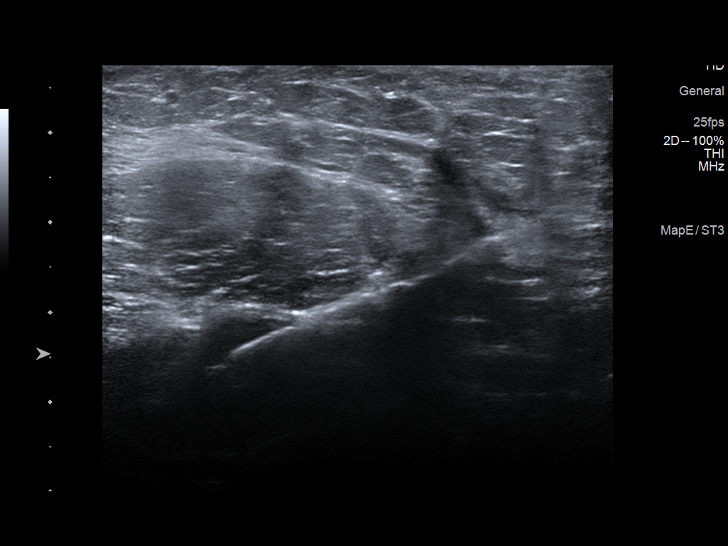
[im 6/12]
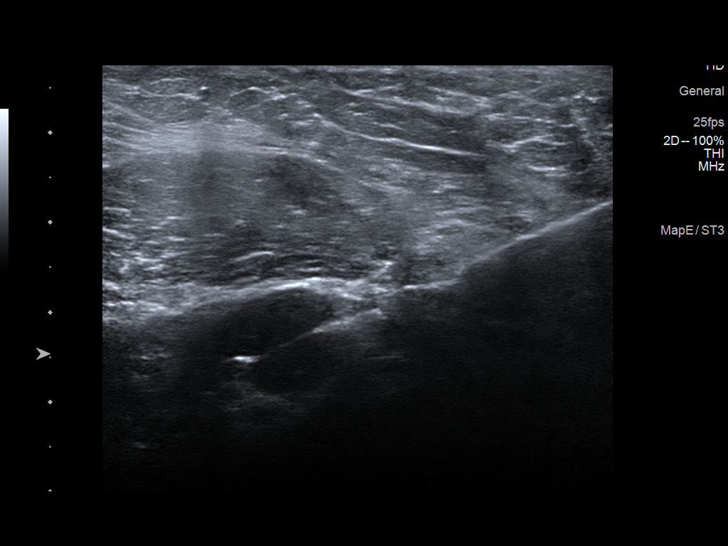
[im 7/12]
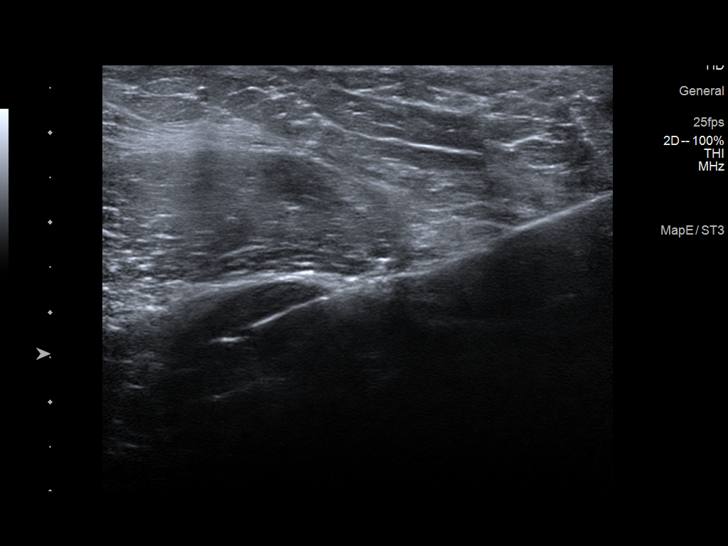
[im 8/12]
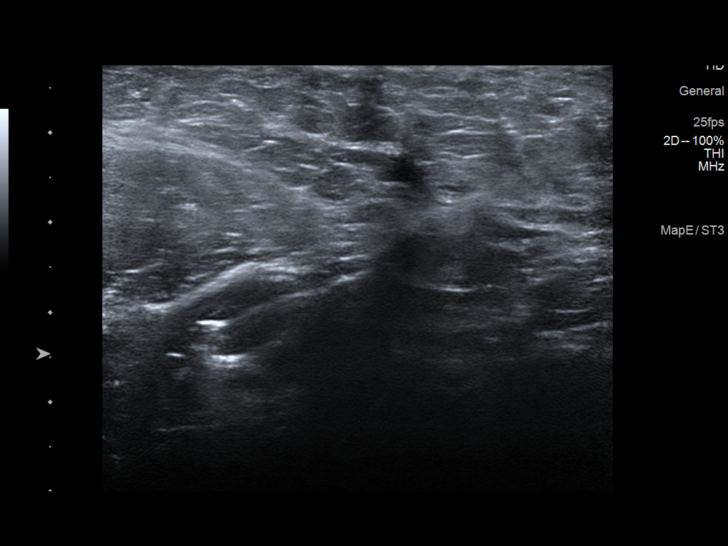
[im 9/12]
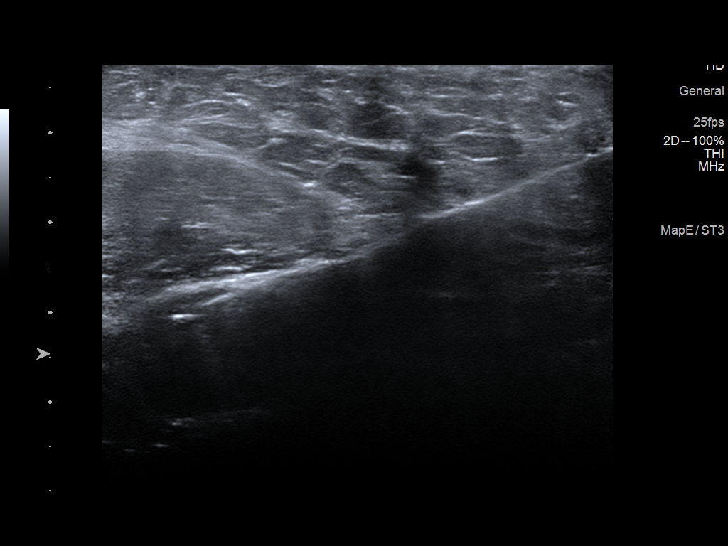
[im 10/12]
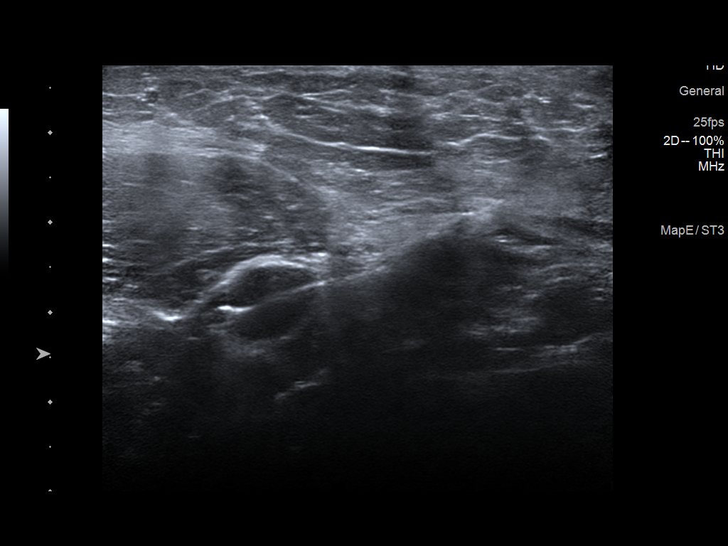
[im 11/12]
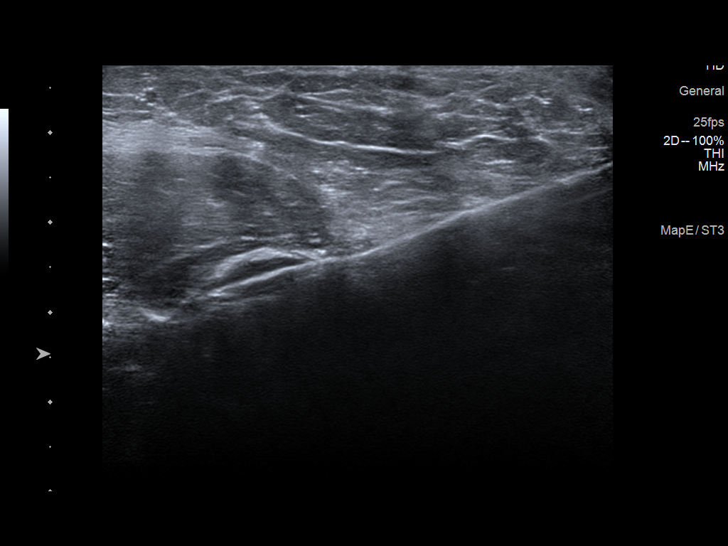
[im 12/12]
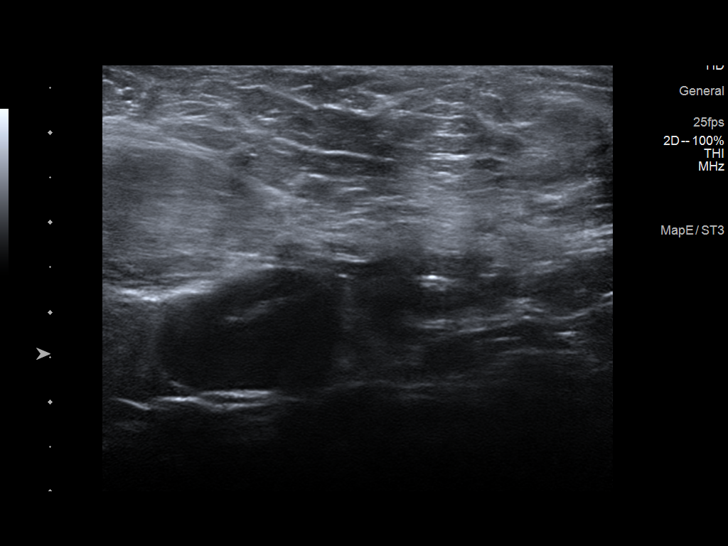

[12 of 12 positions shown; findings below may reference images not displayed]



Using sterile technique and 1% Lidocaine as local anesthetic, under
direct ultrasound visualization, a 14 gauge Ejidike device was
used to perform biopsy of a right axillary lymph node using a
lateral approach. At the conclusion of the procedure a HydroMARK
tissue marker clip was deployed into the biopsy cavity.
IMPRESSION: Ultrasound guided biopsy of an enlarged right axillary lymph node.
No apparent complications.

ADDENDUM:
Pathology revealed REACTIVE LYMPHOID HYPERPLASIA of the RIGHT
axilla, (hydromark clip). Confirmatory flow cytometric analysis was
performed and failed to show any monoclonal B-cell population or
abnormal T-cell phenotype. The

findings are nonspecific but consistent with reactive lymphoid
hyperplasia. This was found to be concordant by Dr. Hoimonti
Iosif.

Pathology results were discussed with the patient by telephone. The
patient reported doing well after the biopsy with tenderness at the
site. Post biopsy instructions and care were reviewed and questions
were answered. The patient was encouraged to call The [REDACTED] for any additional concerns. My direct phone
number was provided.

The patient was instructed to return for annual screening
mammography in March 2023.

NOTE: Clinical Data should state, 61 year old female presenting for
ultrasound-guided biopsy of a RIGHT axillary lymph node.

Pathology results reported by Muttafiq Inqilabli, RN on 04/24/2022.



Using sterile technique and 1% Lidocaine as local anesthetic, under
direct ultrasound visualization, a 14 gauge Ejidike device was
used to perform biopsy of a right axillary lymph node using a
lateral approach. At the conclusion of the procedure a HydroMARK
tissue marker clip was deployed into the biopsy cavity.
IMPRESSION: Ultrasound guided biopsy of an enlarged right axillary lymph node.
No apparent complications.

## 2023-03-04 ENCOUNTER — Other Ambulatory Visit (HOSPITAL_COMMUNITY): Payer: Self-pay

## 2023-03-10 ENCOUNTER — Other Ambulatory Visit (HOSPITAL_COMMUNITY): Payer: Self-pay

## 2023-04-06 ENCOUNTER — Other Ambulatory Visit: Payer: Self-pay

## 2023-04-06 ENCOUNTER — Ambulatory Visit: Payer: Self-pay | Admitting: Internal Medicine

## 2023-04-08 ENCOUNTER — Other Ambulatory Visit: Payer: Self-pay

## 2023-04-08 ENCOUNTER — Other Ambulatory Visit (HOSPITAL_COMMUNITY): Payer: Self-pay

## 2023-04-08 ENCOUNTER — Ambulatory Visit: Payer: No Typology Code available for payment source | Admitting: Infectious Disease

## 2023-04-09 ENCOUNTER — Other Ambulatory Visit (HOSPITAL_COMMUNITY): Payer: Self-pay

## 2023-04-10 ENCOUNTER — Ambulatory Visit: Payer: 59

## 2023-04-30 NOTE — Progress Notes (Signed)
The 10-year ASCVD risk score (Arnett DK, et al., 2019) is: 25%   Values used to calculate the score:     Age: 62 years     Sex: Female     Is Non-Hispanic African American: Yes     Diabetic: Yes     Tobacco smoker: No     Systolic Blood Pressure: 133 mmHg     Is BP treated: Yes     HDL Cholesterol: 60 mg/dL     Total Cholesterol: 287 mg/dL  Sandie Ano, RN

## 2023-05-07 ENCOUNTER — Other Ambulatory Visit (HOSPITAL_COMMUNITY): Payer: Self-pay

## 2023-05-11 ENCOUNTER — Other Ambulatory Visit: Payer: Self-pay

## 2023-05-11 ENCOUNTER — Other Ambulatory Visit (HOSPITAL_COMMUNITY): Payer: Self-pay

## 2023-05-11 ENCOUNTER — Other Ambulatory Visit: Payer: Self-pay | Admitting: Infectious Disease

## 2023-05-11 DIAGNOSIS — B20 Human immunodeficiency virus [HIV] disease: Secondary | ICD-10-CM

## 2023-05-11 MED ORDER — DORAVIRINE 100 MG PO TABS
100.0000 mg | ORAL_TABLET | Freq: Every day | ORAL | 1 refills | Status: DC
Start: 2023-05-11 — End: 2023-07-13
  Filled 2023-05-11: qty 30, 30d supply, fill #0
  Filled 2023-06-09: qty 30, 30d supply, fill #1

## 2023-05-11 MED ORDER — DESCOVY 200-25 MG PO TABS
1.0000 | ORAL_TABLET | Freq: Every day | ORAL | 1 refills | Status: DC
Start: 2023-05-11 — End: 2023-07-13
  Filled 2023-05-11: qty 30, 30d supply, fill #0
  Filled 2023-06-09: qty 30, 30d supply, fill #1

## 2023-05-13 ENCOUNTER — Other Ambulatory Visit (HOSPITAL_COMMUNITY): Payer: Self-pay

## 2023-06-09 ENCOUNTER — Other Ambulatory Visit (HOSPITAL_COMMUNITY): Payer: Self-pay

## 2023-06-19 ENCOUNTER — Other Ambulatory Visit: Payer: Self-pay

## 2023-06-23 ENCOUNTER — Ambulatory Visit: Payer: No Typology Code available for payment source | Admitting: Infectious Disease

## 2023-06-30 ENCOUNTER — Ambulatory Visit: Payer: 59

## 2023-07-02 ENCOUNTER — Ambulatory Visit: Payer: Self-pay | Admitting: Internal Medicine

## 2023-07-09 ENCOUNTER — Other Ambulatory Visit (HOSPITAL_COMMUNITY): Payer: Self-pay

## 2023-07-13 ENCOUNTER — Other Ambulatory Visit (HOSPITAL_COMMUNITY): Payer: Self-pay

## 2023-07-13 ENCOUNTER — Other Ambulatory Visit: Payer: Self-pay

## 2023-07-13 ENCOUNTER — Other Ambulatory Visit: Payer: Self-pay | Admitting: Infectious Disease

## 2023-07-13 DIAGNOSIS — B20 Human immunodeficiency virus [HIV] disease: Secondary | ICD-10-CM

## 2023-07-13 MED ORDER — DESCOVY 200-25 MG PO TABS
1.0000 | ORAL_TABLET | Freq: Every day | ORAL | 1 refills | Status: DC
Start: 2023-07-13 — End: 2023-09-03
  Filled 2023-07-13: qty 30, 30d supply, fill #0
  Filled 2023-08-06: qty 30, 30d supply, fill #1

## 2023-07-13 MED ORDER — DORAVIRINE 100 MG PO TABS
100.0000 mg | ORAL_TABLET | Freq: Every day | ORAL | 1 refills | Status: DC
  Filled 2023-07-13: qty 30, 30d supply, fill #0
  Filled 2023-08-06: qty 30, 30d supply, fill #1

## 2023-07-15 ENCOUNTER — Other Ambulatory Visit (HOSPITAL_COMMUNITY): Payer: Self-pay

## 2023-07-27 ENCOUNTER — Other Ambulatory Visit: Payer: 59

## 2023-07-28 ENCOUNTER — Ambulatory Visit: Payer: 59

## 2023-08-03 ENCOUNTER — Other Ambulatory Visit: Payer: Self-pay | Admitting: Internal Medicine

## 2023-08-03 ENCOUNTER — Other Ambulatory Visit (HOSPITAL_COMMUNITY): Payer: Self-pay

## 2023-08-03 DIAGNOSIS — I10 Essential (primary) hypertension: Secondary | ICD-10-CM

## 2023-08-03 MED ORDER — SPIRONOLACTONE 100 MG PO TABS
100.0000 mg | ORAL_TABLET | Freq: Every day | ORAL | 0 refills | Status: DC
Start: 2023-08-03 — End: 2023-12-21
  Filled 2023-08-03 (×2): qty 90, 90d supply, fill #0

## 2023-08-03 MED ORDER — AMLODIPINE BESYLATE 10 MG PO TABS
10.0000 mg | ORAL_TABLET | Freq: Every day | ORAL | 0 refills | Status: DC
Start: 2023-08-03 — End: 2023-12-21
  Filled 2023-08-03: qty 43, 43d supply, fill #0
  Filled 2023-08-03: qty 47, 47d supply, fill #0
  Filled 2023-08-03: qty 90, 90d supply, fill #0

## 2023-08-03 MED ORDER — LISINOPRIL 10 MG PO TABS
15.0000 mg | ORAL_TABLET | Freq: Every day | ORAL | 0 refills | Status: DC
Start: 2023-08-03 — End: 2024-01-18
  Filled 2023-08-03 (×2): qty 135, 90d supply, fill #0

## 2023-08-06 ENCOUNTER — Other Ambulatory Visit (HOSPITAL_COMMUNITY): Payer: Self-pay

## 2023-08-10 ENCOUNTER — Ambulatory Visit: Payer: No Typology Code available for payment source | Admitting: Infectious Disease

## 2023-08-25 DIAGNOSIS — D3131 Benign neoplasm of right choroid: Secondary | ICD-10-CM | POA: Diagnosis not present

## 2023-08-25 DIAGNOSIS — E119 Type 2 diabetes mellitus without complications: Secondary | ICD-10-CM | POA: Diagnosis not present

## 2023-08-25 LAB — HM DIABETES EYE EXAM

## 2023-08-26 ENCOUNTER — Ambulatory Visit
Admission: RE | Admit: 2023-08-26 | Discharge: 2023-08-26 | Disposition: A | Discharge status: Home / Self Care | Payer: 59 | Source: Ambulatory Visit | Attending: Internal Medicine | Admitting: Internal Medicine

## 2023-08-26 DIAGNOSIS — Z0000 Encounter for general adult medical examination without abnormal findings: Secondary | ICD-10-CM

## 2023-08-26 DIAGNOSIS — Z1231 Encounter for screening mammogram for malignant neoplasm of breast: Secondary | ICD-10-CM | POA: Diagnosis not present

## 2023-08-27 ENCOUNTER — Ambulatory Visit: Payer: Self-pay | Admitting: Internal Medicine

## 2023-09-02 ENCOUNTER — Other Ambulatory Visit: Payer: Self-pay

## 2023-09-03 ENCOUNTER — Other Ambulatory Visit: Payer: Self-pay

## 2023-09-03 ENCOUNTER — Other Ambulatory Visit: Payer: Self-pay | Admitting: Infectious Disease

## 2023-09-03 ENCOUNTER — Other Ambulatory Visit (HOSPITAL_COMMUNITY): Payer: Self-pay

## 2023-09-03 DIAGNOSIS — B20 Human immunodeficiency virus [HIV] disease: Secondary | ICD-10-CM

## 2023-09-03 MED ORDER — DESCOVY 200-25 MG PO TABS
1.0000 | ORAL_TABLET | Freq: Every day | ORAL | 0 refills | Status: DC
  Filled 2023-09-04: qty 30, 30d supply, fill #0

## 2023-09-03 MED ORDER — DORAVIRINE 100 MG PO TABS
100.0000 mg | ORAL_TABLET | Freq: Every day | ORAL | 0 refills | Status: DC
  Filled 2023-09-04: qty 30, 30d supply, fill #0

## 2023-09-03 NOTE — Progress Notes (Signed)
Specialty Pharmacy Refill Coordination Note  Wanda Kane is a 62 y.o. female contacted today regarding refills of specialty medication(s) Doravirine; Emtricitabine-Tenofovir Af   Patient requested Delivery   Delivery date: 09/11/23   Verified address: [address]   [place] [address]   Medication will be filled on 09/10/23.

## 2023-09-03 NOTE — Progress Notes (Signed)
Specialty Pharmacy Ongoing Clinical Assessment Note  Wanda Kane is a 62 y.o. female who is being followed by the specialty pharmacy service for RxSp HIV   Patient's specialty medication(s) reviewed today: Doravirine; Emtricitabine-Tenofovir Af   Missed doses in the last 4 weeks: 0   Patient/Caregiver did not have any additional questions or concerns.   Therapeutic benefit summary: Patient is achieving benefit   Adverse events/side effects summary: No adverse events/side effects   Patient's therapy is appropriate to: Continue    Goals Addressed             This Visit's Progress    Achieve Undetectable HIV Viral Load < 20       Patient is on track. Patient will maintain adherence. Viral load undetectable long term.          Follow up:  6 months  Otto Herb Specialty Pharmacist

## 2023-09-04 ENCOUNTER — Other Ambulatory Visit: Payer: Self-pay

## 2023-09-09 ENCOUNTER — Other Ambulatory Visit: Payer: Self-pay

## 2023-09-09 DIAGNOSIS — Z113 Encounter for screening for infections with a predominantly sexual mode of transmission: Secondary | ICD-10-CM

## 2023-09-09 DIAGNOSIS — B20 Human immunodeficiency virus [HIV] disease: Secondary | ICD-10-CM

## 2023-09-09 DIAGNOSIS — Z79899 Other long term (current) drug therapy: Secondary | ICD-10-CM

## 2023-09-15 ENCOUNTER — Other Ambulatory Visit: Payer: 59

## 2023-09-29 ENCOUNTER — Other Ambulatory Visit: Payer: Self-pay

## 2023-09-29 ENCOUNTER — Ambulatory Visit: Payer: No Typology Code available for payment source | Admitting: Infectious Disease

## 2023-09-29 ENCOUNTER — Other Ambulatory Visit: Payer: Self-pay | Admitting: Infectious Disease

## 2023-09-29 DIAGNOSIS — B20 Human immunodeficiency virus [HIV] disease: Secondary | ICD-10-CM

## 2023-09-29 NOTE — Telephone Encounter (Signed)
Front desk to try and move patient's appointment up. Last seen 04/2022.  Sandie Ano, RN

## 2023-09-29 NOTE — Progress Notes (Signed)
Specialty Pharmacy Refill Coordination Note  Wanda Kane is a 62 y.o. female contacted today regarding refills of specialty medication(s) Doravirine; Emtricitabine-Tenofovir Af   Patient requested Delivery   Delivery date: 10/06/23   Verified address: [name] [place] [address]   Medication will be filled on 10/05/23, pending refill request.

## 2023-10-01 ENCOUNTER — Ambulatory Visit: Payer: Self-pay | Admitting: Internal Medicine

## 2023-10-01 ENCOUNTER — Other Ambulatory Visit: Payer: Self-pay

## 2023-10-01 ENCOUNTER — Telehealth: Payer: Self-pay

## 2023-10-01 DIAGNOSIS — B20 Human immunodeficiency virus [HIV] disease: Secondary | ICD-10-CM

## 2023-10-01 MED ORDER — DESCOVY 200-25 MG PO TABS
1.0000 | ORAL_TABLET | Freq: Every day | ORAL | 0 refills | Status: DC
  Filled 2023-10-01: qty 30, 30d supply, fill #0

## 2023-10-01 NOTE — Telephone Encounter (Signed)
-----   Message from Nurse Aundra Millet B sent at 09/29/2023  1:22 PM EST ----- Would one of you ladies try to move this patient's appointment up? She's not scheduled to see a provider until January, but she hasn't been seen since 04/2022. It would be fine for her to see pharmacy, we just need some follow-up and labs in order to refill her meds.   Thank you! Meg

## 2023-10-01 NOTE — Telephone Encounter (Signed)
Left patient voice mail to call back to schedule office visit or pharmacy visit before scheduled date to be able to fill medication.

## 2023-10-02 ENCOUNTER — Other Ambulatory Visit: Payer: Self-pay

## 2023-10-02 MED ORDER — DORAVIRINE 100 MG PO TABS
100.0000 mg | ORAL_TABLET | Freq: Every day | ORAL | 1 refills | Status: DC
  Filled 2023-10-02 (×2): qty 30, 30d supply, fill #0
  Filled 2023-11-04: qty 30, 30d supply, fill #1

## 2023-11-04 ENCOUNTER — Other Ambulatory Visit: Payer: 59

## 2023-11-04 ENCOUNTER — Other Ambulatory Visit: Payer: Self-pay

## 2023-11-04 ENCOUNTER — Other Ambulatory Visit: Payer: Self-pay | Admitting: Infectious Disease

## 2023-11-04 DIAGNOSIS — B20 Human immunodeficiency virus [HIV] disease: Secondary | ICD-10-CM

## 2023-11-04 NOTE — Progress Notes (Signed)
Specialty Pharmacy Refill Coordination Note  Wanda Kane is a 62 y.o. female contacted today regarding refills of specialty medication(s) Doravirine (PIFELTRO); Emtricitabine-Tenofovir AF (Descovy)   Patient requested Delivery   Delivery date: 11/09/23   Verified address: [address] [place] [address]   Medication will be filled on 11/06/23, pending refill approval for [place]y.

## 2023-11-05 ENCOUNTER — Other Ambulatory Visit (HOSPITAL_COMMUNITY): Payer: Self-pay

## 2023-11-05 ENCOUNTER — Other Ambulatory Visit: Payer: Self-pay

## 2023-11-05 MED ORDER — DESCOVY 200-25 MG PO TABS
1.0000 | ORAL_TABLET | Freq: Every day | ORAL | 0 refills | Status: DC
  Filled 2023-11-05: qty 30, 30d supply, fill #0

## 2023-11-06 ENCOUNTER — Other Ambulatory Visit: Payer: Self-pay

## 2023-11-16 ENCOUNTER — Other Ambulatory Visit: Payer: 59

## 2023-11-27 ENCOUNTER — Other Ambulatory Visit: Payer: Self-pay

## 2023-11-30 ENCOUNTER — Ambulatory Visit: Payer: 59 | Admitting: Infectious Disease

## 2023-11-30 ENCOUNTER — Other Ambulatory Visit (HOSPITAL_COMMUNITY): Payer: Self-pay

## 2023-12-02 ENCOUNTER — Other Ambulatory Visit: Payer: Self-pay | Admitting: Infectious Disease

## 2023-12-02 ENCOUNTER — Other Ambulatory Visit: Payer: Self-pay

## 2023-12-02 DIAGNOSIS — B20 Human immunodeficiency virus [HIV] disease: Secondary | ICD-10-CM

## 2023-12-02 NOTE — Progress Notes (Signed)
 Specialty Pharmacy Refill Coordination Note  Wanda Kane is a 63 y.o. female contacted today regarding refills of specialty medication(s) Doravirine  (PIFELTRO ); Emtricitabine -Tenofovir  AF (Descovy )   Patient requested Delivery   Delivery date: 12/07/23 (or 01/21)   Verified address: [address]   [place] [address]   Medication will be filled on 12/04/23.   Pending refill request

## 2023-12-03 ENCOUNTER — Other Ambulatory Visit: Payer: Self-pay

## 2023-12-03 ENCOUNTER — Other Ambulatory Visit (HOSPITAL_COMMUNITY): Payer: Self-pay

## 2023-12-03 ENCOUNTER — Ambulatory Visit: Payer: Self-pay | Admitting: Internal Medicine

## 2023-12-03 MED ORDER — DESCOVY 200-25 MG PO TABS
1.0000 | ORAL_TABLET | Freq: Every day | ORAL | 0 refills | Status: DC
  Filled 2023-12-03: qty 30, 30d supply, fill #0

## 2023-12-03 MED ORDER — DORAVIRINE 100 MG PO TABS
100.0000 mg | ORAL_TABLET | Freq: Every day | ORAL | 0 refills | Status: DC
  Filled 2023-12-03: qty 30, 30d supply, fill #0

## 2023-12-04 ENCOUNTER — Other Ambulatory Visit (HOSPITAL_COMMUNITY): Payer: Self-pay

## 2023-12-04 ENCOUNTER — Other Ambulatory Visit: Payer: Self-pay

## 2023-12-04 ENCOUNTER — Telehealth: Payer: Self-pay

## 2023-12-04 NOTE — Telephone Encounter (Signed)
[facility] Patient Advocate Encounter   Was successful in obtaining a [employer] copay card for [employer].  This copay card will make the patients copay 0.00.  I have spoken with the patient.    The billing information is as follows and has been shared with [facility].  RxBin: [id number] PCN:  Loyalty Member ID: [id number] Group ID: [id number] Expiration Date: 11/16/2024   Clearance Coots, CPhT Specialty Pharmacy Patient Advocate [facility] for [facility] Phone: [phone] Fax:  [phone]

## 2023-12-15 ENCOUNTER — Other Ambulatory Visit: Payer: Self-pay

## 2023-12-15 ENCOUNTER — Other Ambulatory Visit: Payer: 59

## 2023-12-15 DIAGNOSIS — B20 Human immunodeficiency virus [HIV] disease: Secondary | ICD-10-CM

## 2023-12-15 DIAGNOSIS — Z79899 Other long term (current) drug therapy: Secondary | ICD-10-CM | POA: Diagnosis not present

## 2023-12-15 DIAGNOSIS — Z113 Encounter for screening for infections with a predominantly sexual mode of transmission: Secondary | ICD-10-CM | POA: Diagnosis not present

## 2023-12-16 LAB — T-HELPER CELL (CD4) - (RCID CLINIC ONLY)
CD4 % Helper T Cell: 24 % — ABNORMAL LOW (ref 33–65)
CD4 T Cell Abs: 1438 /uL (ref 400–1790)

## 2023-12-18 LAB — COMPLETE METABOLIC PANEL WITH GFR
AG Ratio: 1.4 (calc) (ref 1.0–2.5)
ALT: 24 U/L (ref 6–29)
AST: 29 U/L (ref 10–35)
Albumin: 4.5 g/dL (ref 3.6–5.1)
Alkaline phosphatase (APISO): 91 U/L (ref 37–153)
BUN: 8 mg/dL (ref 7–25)
CO2: 26 mmol/L (ref 20–32)
Calcium: 10.2 mg/dL (ref 8.6–10.4)
Chloride: 99 mmol/L (ref 98–110)
Creat: 0.72 mg/dL (ref 0.50–1.05)
Globulin: 3.3 g/dL (ref 1.9–3.7)
Glucose, Bld: 189 mg/dL — ABNORMAL HIGH (ref 65–99)
Potassium: 3.8 mmol/L (ref 3.5–5.3)
Sodium: 137 mmol/L (ref 135–146)
Total Bilirubin: 0.7 mg/dL (ref 0.2–1.2)
Total Protein: 7.8 g/dL (ref 6.1–8.1)
eGFR: 94 mL/min/{1.73_m2} (ref >=60)

## 2023-12-18 LAB — HIV-1 RNA QUANT-NO REFLEX-BLD
HIV 1 RNA Quant: NOT DETECTED {copies}/mL
HIV-1 RNA Quant, Log: NOT DETECTED {Log}

## 2023-12-18 LAB — CBC WITH DIFFERENTIAL/PLATELET
Absolute Lymphocytes: 6976 {cells}/uL — ABNORMAL HIGH (ref 850–3900)
Absolute Monocytes: 512 {cells}/uL (ref 200–950)
Basophils Absolute: 44 {cells}/uL (ref 0–200)
Basophils Relative: 0.4 %
Eosinophils Absolute: 120 {cells}/uL (ref 15–500)
Eosinophils Relative: 1.1 %
HCT: 45.4 % — ABNORMAL HIGH (ref 35.0–45.0)
Hemoglobin: 15.6 g/dL — ABNORMAL HIGH (ref 11.7–15.5)
MCH: 30.5 pg (ref 27.0–33.0)
MCHC: 34.4 g/dL (ref 32.0–36.0)
MCV: 88.7 fL (ref 80.0–100.0)
MPV: 12.1 fL (ref 7.5–12.5)
Monocytes Relative: 4.7 %
Neutro Abs: 3248 {cells}/uL (ref 1500–7800)
Neutrophils Relative %: 29.8 %
Platelets: 269 10*3/uL (ref 140–400)
RBC: 5.12 10*6/uL — ABNORMAL HIGH (ref 3.80–5.10)
RDW: 12.3 % (ref 11.0–15.0)
Total Lymphocyte: 64 %
WBC: 10.9 10*3/uL — ABNORMAL HIGH (ref 3.8–10.8)

## 2023-12-18 LAB — RPR: RPR Ser Ql: NONREACTIVE

## 2023-12-18 LAB — LIPID PANEL
Cholesterol: 353 mg/dL — ABNORMAL HIGH (ref <200)
HDL: 51 mg/dL (ref >=50)
Non-HDL Cholesterol (Calc): 302 mg/dL — ABNORMAL HIGH (ref <130)
Total CHOL/HDL Ratio: 6.9 (calc) — ABNORMAL HIGH (ref <5.0)
Triglycerides: 617 mg/dL — ABNORMAL HIGH (ref <150)

## 2023-12-21 ENCOUNTER — Other Ambulatory Visit: Payer: Self-pay | Admitting: Internal Medicine

## 2023-12-21 ENCOUNTER — Other Ambulatory Visit: Payer: Self-pay

## 2023-12-21 DIAGNOSIS — I10 Essential (primary) hypertension: Secondary | ICD-10-CM

## 2023-12-21 MED ORDER — SPIRONOLACTONE 100 MG PO TABS
100.0000 mg | ORAL_TABLET | Freq: Every day | ORAL | 0 refills | Status: DC
  Filled 2023-12-21 – 2023-12-22 (×2): qty 30, 30d supply, fill #0

## 2023-12-21 MED ORDER — AMLODIPINE BESYLATE 10 MG PO TABS
10.0000 mg | ORAL_TABLET | Freq: Every day | ORAL | 0 refills | Status: DC
  Filled 2023-12-21: qty 30, 30d supply, fill #0

## 2023-12-22 ENCOUNTER — Other Ambulatory Visit (HOSPITAL_COMMUNITY): Payer: Self-pay

## 2023-12-22 ENCOUNTER — Other Ambulatory Visit: Payer: Self-pay

## 2023-12-24 ENCOUNTER — Other Ambulatory Visit: Payer: Self-pay

## 2023-12-24 ENCOUNTER — Other Ambulatory Visit (HOSPITAL_COMMUNITY): Payer: Self-pay

## 2023-12-24 ENCOUNTER — Other Ambulatory Visit: Payer: Self-pay | Admitting: Infectious Disease

## 2023-12-24 DIAGNOSIS — B20 Human immunodeficiency virus [HIV] disease: Secondary | ICD-10-CM

## 2023-12-24 NOTE — Progress Notes (Signed)
 Specialty Pharmacy Refill Coordination Note  Wanda Kane is a 63 y.o. female contacted today regarding refills of specialty medication(s) Doravirine  (PIFELTRO ); Emtricitabine -Tenofovir  AF (Descovy )   Patient requested Delivery   Delivery date: 12/30/23   Verified address: [address]   [place] [address]   Medication will be filled on 12/29/23.   Pending refill request  This fill date is pending response to refill request from provider. Patient is aware and if they have not received fill by intended date, they must follow up with pharmacy.

## 2023-12-24 NOTE — Telephone Encounter (Signed)
 Appt 2/11

## 2023-12-25 ENCOUNTER — Other Ambulatory Visit: Payer: Self-pay

## 2023-12-25 NOTE — Progress Notes (Signed)
 Refill were denied. Patient has not been seen since 04/2022. Refills will be given after patient's appointment on 2/11.

## 2023-12-25 NOTE — Telephone Encounter (Signed)
 Medications last dispensed 12/04/23, additional refills at 2/11 appointment.   Wanda Kane D Danni Shima, RN

## 2023-12-29 ENCOUNTER — Other Ambulatory Visit: Payer: Self-pay

## 2023-12-29 ENCOUNTER — Other Ambulatory Visit (HOSPITAL_COMMUNITY): Payer: Self-pay

## 2023-12-29 ENCOUNTER — Encounter: Payer: Self-pay | Admitting: Internal Medicine

## 2023-12-29 ENCOUNTER — Telehealth (INDEPENDENT_AMBULATORY_CARE_PROVIDER_SITE_OTHER): Payer: 59 | Admitting: Infectious Disease

## 2023-12-29 DIAGNOSIS — I10 Essential (primary) hypertension: Secondary | ICD-10-CM

## 2023-12-29 DIAGNOSIS — E785 Hyperlipidemia, unspecified: Secondary | ICD-10-CM

## 2023-12-29 DIAGNOSIS — R87810 Cervical high risk human papillomavirus (HPV) DNA test positive: Secondary | ICD-10-CM

## 2023-12-29 DIAGNOSIS — D72829 Elevated white blood cell count, unspecified: Secondary | ICD-10-CM

## 2023-12-29 DIAGNOSIS — D751 Secondary polycythemia: Secondary | ICD-10-CM

## 2023-12-29 DIAGNOSIS — E119 Type 2 diabetes mellitus without complications: Secondary | ICD-10-CM

## 2023-12-29 DIAGNOSIS — Z6838 Body mass index (BMI) 38.0-38.9, adult: Secondary | ICD-10-CM

## 2023-12-29 DIAGNOSIS — E559 Vitamin D deficiency, unspecified: Secondary | ICD-10-CM

## 2023-12-29 DIAGNOSIS — B20 Human immunodeficiency virus [HIV] disease: Secondary | ICD-10-CM

## 2023-12-29 DIAGNOSIS — Z7185 Encounter for immunization safety counseling: Secondary | ICD-10-CM

## 2023-12-29 DIAGNOSIS — E1165 Type 2 diabetes mellitus with hyperglycemia: Secondary | ICD-10-CM

## 2023-12-29 MED ORDER — DORAVIRINE 100 MG PO TABS
100.0000 mg | ORAL_TABLET | Freq: Every day | ORAL | 11 refills | Status: DC
  Filled 2023-12-29: qty 30, 30d supply, fill #0
  Filled 2024-01-26: qty 30, 30d supply, fill #1
  Filled 2024-02-25: qty 30, 30d supply, fill #2
  Filled 2024-03-28 (×2): qty 30, 30d supply, fill #3
  Filled 2024-04-08 – 2024-05-10 (×2): qty 30, 30d supply, fill #4
  Filled 2024-06-07: qty 30, 30d supply, fill #5
  Filled 2024-06-28: qty 30, 30d supply, fill #6
  Filled 2024-07-29: qty 30, 30d supply, fill #7
  Filled 2024-08-29: qty 30, 30d supply, fill #8
  Filled 2024-09-29 (×2): qty 30, 30d supply, fill #9
  Filled 2024-10-21: qty 30, 30d supply, fill #10
  Filled 2024-11-15: qty 30, 30d supply, fill #11

## 2023-12-29 MED ORDER — DESCOVY 200-25 MG PO TABS
1.0000 | ORAL_TABLET | Freq: Every day | ORAL | 0 refills | Status: DC
  Filled 2023-12-29: qty 30, 30d supply, fill #0

## 2023-12-29 MED ORDER — ROSUVASTATIN CALCIUM 20 MG PO TABS
20.0000 mg | ORAL_TABLET | Freq: Every day | ORAL | 11 refills | Status: AC
  Filled 2023-12-29: qty 30, 30d supply, fill #0
  Filled 2024-04-08: qty 30, 30d supply, fill #1
  Filled 2024-07-15: qty 30, 30d supply, fill #2
  Filled 2024-10-07: qty 30, 30d supply, fill #3

## 2023-12-29 NOTE — Progress Notes (Signed)
Virtual Visit via Video Note  I connected with Wanda Kane on 12/29/23 at 11:15 AM EST by a video enabled telemedicine application and verified that I am speaking with the correct person using two identifiers.  Location: Patient: Home Provider: [facility]   I discussed the limitations of evaluation and management by telemedicine and the availability of in person appointments. The patient expressed understanding and agreed to proceed.  History of Present Illness:   Discussed the use of AI scribe software for clinical note transcription with the patient, who gave verbal consent to proceed.  History of Present Illness   The patient, living with HIV and diabetes, presents with concerns about her current diabetes management. She reports experiencing loose stools as a side effect of metformin, which she finds intolerable. She has been considering gastric bypass surgery as a potential solution. The patient also expresses concerns about her cholesterol levels. She is currently not on any cholesterol-lowering medication, but the doctor has recommended starting Crestor. The patient works night shifts, which she is considering changing due to potential health concerns. She has been keeping up with her immunizations, but the doctor has recommended getting the Prevnar 20 shot. The patient is currently on a two-pill-a-day regimen for HIV management (Pifeltro and Descovy), but the doctor has mentioned a potential future one-pill-a-day option.        Past Medical History:  Diagnosis Date   Anxiety    Depression    DVT (deep venous thrombosis) (HCC) 11/2014   Fatty liver    Fibroids 2010   Genital herpes 08/12/2017   High cholesterol    HIV infection (HCC) 1994   Hypertension 1994   Medical history non-contributory    Microalbuminuria due to type 2 diabetes mellitus (HCC) 11/04/2020   Myositis 03/09/2019   Pancreatitis 03/09/2019   Pancreatitis    Pre-diabetes    Type 2 diabetes mellitus with  hyperglycemia (HCC) 03/03/2019   diagnosed in December 2019   Vaginal bleeding 12/2014   due to xarelto    Past Surgical History:  Procedure Laterality Date   CESAREAN SECTION  11/17/1981   CESAREAN SECTION     IVC FILTER PLACEMENT ([facility] HX)     TONSILLECTOMY AND ADENOIDECTOMY      Family History  Problem Relation Age of Onset   Hypertension Mother    Hyperlipidemia Father    Hypertension Father    CAD Brother    Heart disease Brother 47       cardiac arrest   Breast cancer Neg Hx       Social History   Socioeconomic History   Marital status: Single    Spouse name: Not on file   Number of children: Not on file   Years of education: college, BSW   Highest education level: Not on file  Occupational History   Occupation: Psychologist, sport and exercise    Employer: [employer]  Tobacco Use   Smoking status: Never    Passive exposure: Never   Smokeless tobacco: Never  Vaping Use   Vaping status: Never Used  Substance and Sexual Activity   Alcohol use: No   Drug use: No   Sexual activity: Not Currently    Partners: Male    Comment: patient declined condoms  Other Topics Concern   Not on file  Social History Narrative   She works as a Psychologist, 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   She loves her job   Has BSW   Working towards Building services engineer   Social  Drivers of Health   Financial Resource Strain: Low Risk  (03/09/2019)   Overall Financial Resource Strain (CARDIA)    Difficulty of Paying Living Expenses: Not hard at all  Food Insecurity: No Food Insecurity (01/26/2023)   Hunger Vital Sign    Worried About Running Out of Food in the Last Year: Never true    Ran Out of Food in the Last Year: Never true  Transportation Needs: No Transportation Needs (01/26/2023)   PRAPARE - Administrator, Civil Service (Medical): No    Lack of Transportation (Non-Medical): No  Physical Activity: Unknown (03/09/2019)   Exercise Vital Sign    Days of Exercise per Week: Patient declined    Minutes of  Exercise per Session: Patient declined  Stress: Stress Concern Present (03/09/2019)   [employer] of Occupational Health - Occupational Stress Questionnaire    Feeling of Stress : To some extent  Social Connections: Unknown (03/09/2019)   Social Connection and Isolation Panel [NHANES]    Frequency of Communication with Friends and Family: Patient declined    Frequency of Social Gatherings with Friends and Family: Patient declined    Attends Religious Services: Patient declined    Database administrator or Organizations: Patient declined    Attends Banker Meetings: Patient declined    Marital Status: Patient declined    Allergies  Allergen Reactions   Prilosec [Omeprazole] Other (See Comments)     Current Outpatient Medications:    amLODipine (NORVASC) 10 MG tablet, Take 1 tablet (10 mg total) by mouth daily., Disp: 30 tablet, Rfl: 0   B-Complex-C TABS, Take 1 tablet by mouth daily., Disp: , Rfl:    doravirine (PIFELTRO) 100 MG TABS tablet, Take 1 tablet (100 mg total) by mouth daily., Disp: 30 tablet, Rfl: 0   emtricitabine-tenofovir AF (DESCOVY) 200-25 MG tablet, Take 1 tablet by mouth daily., Disp: 30 tablet, Rfl: 0   lisinopril (ZESTRIL) 10 MG tablet, Take 1.5 tablets (15 mg total) by mouth daily., Disp: 135 tablet, Rfl: 0   metFORMIN (GLUCOPHAGE) 1000 MG tablet, Take 1 tablet (1,000 mg total) by mouth 2 (two) times daily with a meal. Please keep upcoming appt for additional refills. (Patient taking differently: Take 500 mg by mouth 2 (two) times daily with a meal.), Disp: 180 tablet, Rfl: 0   Multiple Vitamin (MULTIVITAMIN) capsule, Take 1 capsule by mouth daily., Disp: , Rfl:    spironolactone (ALDACTONE) 100 MG tablet, Take 1 tablet (100 mg total) by mouth daily. Please keep upcoming appt for additional refills., Disp: 30 tablet, Rfl: 0     Observations/Objective:  [name] was in no acute distress and comfortable on video feed  Assessment and  Plan:  Assessment and Plan    HIV Viral load undetectable and CD4 count high (1438), indicating good control of HIV with current regimen of Pifeltro and Descovy. -Continue Pifeltro and Descovy. -Monitor for future one-pill once a day regimen combining Pifeltro and Islatravir.  Hyperlipidemia Previous high bad cholesterol (170). Discussed the benefits of statin therapy, particularly in the context of diabetes and HIV. -Start Crestor 20mg  daily. -Primary care provider to manage further lipid control.  Diabetes Patient experiencing side effects (loose stools) from Metformin. Discussed potential alternatives including Ozempic or Mounjaro, and the possibility of gastric bypass. -Consider Ozempic or Mounjaro as alternatives to Metformin, to be managed by primary care provider.   Elevated Hemoglobin Mildly elevated hemoglobin, possibly due to dehydration or increased work hours. No symptoms of sleep apnea  reported. -Monitor hemoglobin levels.  Elevated White Count Chronic mildly elevated white count, likely normal for the patient. -Continue to monitor white count.  Cervical Cancer Screening Last Pap smear in March 2024, with low-grade changes noted. -Schedule appointment with Judeth Cornfield for repeat Pap smear.  Vaccinations Last pneumonia shot in 2012, flu shot and COVID shot received in Fall 2024. -Administer Prevnar 20. -Ensure annual flu shot and COVID shot are up to date.  Follow-up -Schedule lab appointment and video appointment in 10 months. -Schedule appointment with primary care provider, Dr. Jonah Blue, at the beginning of next month.        Follow Up Instructions:    I discussed the assessment and treatment plan with the patient. The patient was provided an opportunity to ask questions and all were answered. The patient agreed with the plan and demonstrated an understanding of the instructions.   The patient was advised to call back or seek an in-person evaluation  if the symptoms worsen or if the condition fails to improve as anticipated.  I provided 30 minutes of non-face-to-face time during this encounter.   Acey Lav, MD

## 2023-12-31 ENCOUNTER — Other Ambulatory Visit (HOSPITAL_COMMUNITY): Payer: Self-pay

## 2024-01-01 ENCOUNTER — Encounter: Payer: Self-pay | Admitting: Infectious Disease

## 2024-01-04 ENCOUNTER — Encounter: Payer: Self-pay | Admitting: Infectious Disease

## 2024-01-04 ENCOUNTER — Encounter: Payer: Self-pay | Admitting: Internal Medicine

## 2024-01-18 ENCOUNTER — Ambulatory Visit: Payer: Self-pay | Attending: Internal Medicine | Admitting: Internal Medicine

## 2024-01-18 ENCOUNTER — Encounter: Payer: Self-pay | Admitting: Internal Medicine

## 2024-01-18 VITALS — BP 122/78 | HR 100 | Temp 97.9°F | Ht 65.0 in | Wt 212.0 lb

## 2024-01-18 DIAGNOSIS — E1169 Type 2 diabetes mellitus with other specified complication: Secondary | ICD-10-CM | POA: Diagnosis not present

## 2024-01-18 DIAGNOSIS — E669 Obesity, unspecified: Secondary | ICD-10-CM

## 2024-01-18 DIAGNOSIS — B20 Human immunodeficiency virus [HIV] disease: Secondary | ICD-10-CM

## 2024-01-18 DIAGNOSIS — Z7984 Long term (current) use of oral hypoglycemic drugs: Secondary | ICD-10-CM | POA: Diagnosis not present

## 2024-01-18 DIAGNOSIS — E785 Hyperlipidemia, unspecified: Secondary | ICD-10-CM | POA: Diagnosis not present

## 2024-01-18 DIAGNOSIS — Z23 Encounter for immunization: Secondary | ICD-10-CM | POA: Diagnosis not present

## 2024-01-18 DIAGNOSIS — I10 Essential (primary) hypertension: Secondary | ICD-10-CM

## 2024-01-18 DIAGNOSIS — I152 Hypertension secondary to endocrine disorders: Secondary | ICD-10-CM

## 2024-01-18 DIAGNOSIS — E119 Type 2 diabetes mellitus without complications: Secondary | ICD-10-CM

## 2024-01-18 DIAGNOSIS — Z794 Long term (current) use of insulin: Secondary | ICD-10-CM

## 2024-01-18 DIAGNOSIS — B351 Tinea unguium: Secondary | ICD-10-CM | POA: Diagnosis not present

## 2024-01-18 LAB — POCT GLYCOSYLATED HEMOGLOBIN (HGB A1C): HbA1c, POC (controlled diabetic range): 12.3 % — AB (ref 0.0–7.0)

## 2024-01-18 LAB — GLUCOSE, POCT (MANUAL RESULT ENTRY): POC Glucose: 317 mg/dL — AB (ref 70–99)

## 2024-01-18 MED ORDER — PEN NEEDLES 31G X 8 MM MISC
6 refills | Status: AC
Start: 2024-01-18 — End: ?
  Filled 2024-01-18: qty 100, 90d supply, fill #0
  Filled 2024-02-18: qty 100, 90d supply, fill #1

## 2024-01-18 MED ORDER — LANTUS SOLOSTAR 100 UNIT/ML ~~LOC~~ SOPN
12.0000 [IU] | PEN_INJECTOR | Freq: Every day | SUBCUTANEOUS | 11 refills | Status: DC
  Filled 2024-01-18: qty 9, 75d supply, fill #0

## 2024-01-18 MED ORDER — LISINOPRIL 10 MG PO TABS
15.0000 mg | ORAL_TABLET | Freq: Every day | ORAL | 1 refills | Status: DC
  Filled 2024-01-18: qty 135, 90d supply, fill #0
  Filled 2024-04-08: qty 135, 90d supply, fill #1

## 2024-01-18 MED ORDER — FREESTYLE LIBRE 3 PLUS SENSOR MISC
11 refills | Status: AC
  Filled 2024-01-18: qty 2, 30d supply, fill #0

## 2024-01-18 MED ORDER — SPIRONOLACTONE 100 MG PO TABS
100.0000 mg | ORAL_TABLET | Freq: Every day | ORAL | 1 refills | Status: DC
  Filled 2024-01-18: qty 90, 90d supply, fill #0
  Filled 2024-04-08: qty 90, 90d supply, fill #1

## 2024-01-18 MED ORDER — AMLODIPINE BESYLATE 10 MG PO TABS
10.0000 mg | ORAL_TABLET | Freq: Every day | ORAL | 1 refills | Status: DC
  Filled 2024-01-18: qty 90, 90d supply, fill #0
  Filled 2024-04-08: qty 90, 90d supply, fill #1

## 2024-01-18 MED ORDER — INSULIN GLARGINE 100 UNITS/ML SOLOSTAR PEN
12.0000 [IU] | PEN_INJECTOR | Freq: Every day | SUBCUTANEOUS | 11 refills | Status: DC
  Filled 2024-01-18: qty 15, 125d supply, fill #0

## 2024-01-18 MED ORDER — CICLOPIROX 8 % EX SOLN
Freq: Every day | CUTANEOUS | 2 refills | Status: AC
  Filled 2024-01-18: qty 6.6, 28d supply, fill #0

## 2024-01-18 MED ORDER — FREESTYLE LIBRE 3 READER DEVI
0 refills | Status: AC
  Filled 2024-01-18: qty 1, 30d supply, fill #0

## 2024-01-18 NOTE — Progress Notes (Unsigned)
 Patient ID: Wanda Kane, female    DOB: December 23, 1960  MRN: [id number]  CC: Diabetes (DM f/u. Med refill. Franchot Erichsen Ozempic & Crestor Valentino Hue to shingles & pneumonia vax. Will bring imm rec. )   Subjective: Wanda Kane is a 63 y.o. female who presents for chronic ds management. Her concerns today include:  hx of HIV (followed by ID with undetected viral load), HTN, DM, obesity, fibroid, HL,  pap with pos HPV, DVT with filter, pancreatitis (02/2019 through to be due to med Julu   Discussed the use of AI scribe software for clinical note transcription with the patient, who gave verbal consent to proceed.  History of Present Illness   The patient, with a history of type 2 diabetes, hypertension, and HIV, presents with poorly controlled diabetes.  DM: Results for orders placed or performed in visit on 01/18/24  POCT glucose (manual entry)   Collection Time: 01/18/24  4:52 PM  Result Value Ref Range   POC Glucose 317 (A) 70 - 99 mg/dl  POCT glycosylated hemoglobin (Hb A1C)   Collection Time: 01/18/24  4:57 PM  Result Value Ref Range   Hemoglobin A1C     HbA1c POC (<> result, manual entry)     HbA1c, POC (prediabetic range)     HbA1c, POC (controlled diabetic range) 12.3 (A) 0.0 - 7.0 %  She was left on Metformin 1 gram was dec to 500 mg BID because the higher dose caused gastric upset.  She admits to not checking her blood sugars as often as she should but levels in upper 100 when she does check.  On last visit with me in July 2023, we talked about possibility of GLP-1 agonist but I wanted her to see endocrinology to get their opinion on this as she had previous episode/hospitalization for pancreatitis that was thought to have been caused by one of her HIV medications.  Patient states she canceled the appointment.  She is now wanting to get on Ozempic.  She spoke with her infectious disease specialist about it and he told her that she should at least give it a try.  She has been trying to  monitor carbs and exercise when she can. In addition to her diabetes, the patient has high cholesterol and has been prescribed Crestor by ID, which she has yet to start.  She wanted to speak with me about it first.    In regards to her blood pressure, she is compliant with taking the lisinopril 15 mg daily, amlodipine 10 mg daily and spironolactone 100 mg daily.  The patient also reports a persistent issue with toenail fungus. She has tried home remedies without success and expresses concern about potential liver or kidney damage from oral antifungal medication. She is open to trying a topical treatment.  HM: Lastly, the patient acknowledges the need for colon cancer screening but expresses fear due to hearing about complications in others. She mentions a past episode of constipation and current hemorrhoids as additional concerns.  Due for second shingles vaccine and pneumonia vaccine.     Patient Active Problem List   Diagnosis Date Noted   Type 2 diabetes mellitus with obesity (HCC) 07/02/2022   Class 2 severe obesity with serious comorbidity and body mass index (BMI) of 38.0 to 38.9 in adult [facility]) 07/02/2022   Vitamin D deficiency 06/19/2022   Microalbuminuria due to type 2 diabetes mellitus (HCC) 11/04/2020   Statin declined 11/02/2020   Fatty liver 03/28/2019   History of DVT (  deep vein thrombosis) 03/09/2019   Presence of IVC filter 03/09/2019   Type 2 diabetes mellitus with hyperglycemia (HCC) 03/03/2019   Hyperlipemia, mixed 12/21/2018   Genital herpes 08/12/2017   Pap smear of cervix shows high risk HPV present 06/22/2017   HIV disease (HCC) 11/22/2015   Hypertension 12/29/2014   Anemia 10/12/2013   Condyloma acuminatum 10/27/2012   Acquired acanthosis nigricans 10/27/2012   Uterine fibroid 10/27/2012   Hyperlipidemia associated with type 2 diabetes mellitus (HCC) 10/18/2012     Current Outpatient Medications on File Prior to Visit  Medication Sig Dispense Refill    B-Complex-C TABS Take 1 tablet by mouth daily.     doravirine (PIFELTRO) 100 MG TABS tablet Take 1 tablet (100 mg total) by mouth daily. 30 tablet 11   emtricitabine-tenofovir AF (DESCOVY) 200-25 MG tablet Take 1 tablet by mouth daily. 30 tablet 0   metFORMIN (GLUCOPHAGE) 1000 MG tablet Take 1 tablet (1,000 mg total) by mouth 2 (two) times daily with a meal. Please keep upcoming appt for additional refills. 180 tablet 0   Multiple Vitamin (MULTIVITAMIN) capsule Take 1 capsule by mouth daily.     rosuvastatin (CRESTOR) 20 MG tablet Take 1 tablet (20 mg total) by mouth daily. (Patient not taking: Reported on 01/18/2024) 30 tablet 11   No current facility-administered medications on file prior to visit.    Allergies  Allergen Reactions   Prilosec [Omeprazole] Other (See Comments)    Social History   Socioeconomic History   Marital status: Single    Spouse name: Not on file   Number of children: Not on file   Years of education: college, BSW   Highest education level: Master's degree (e.g., MA, MS, MEng, MEd, MSW, MBA)  Occupational History   Occupation: Curator: [employer]  Tobacco Use   Smoking status: Never    Passive exposure: Never   Smokeless tobacco: Never  Vaping Use   Vaping status: Never Used  Substance and Sexual Activity   Alcohol use: No   Drug use: No   Sexual activity: Not Currently    Partners: Male    Comment: patient declined condoms  Other Topics Concern   Not on file  Social History Narrative   She works as a Psychologist, 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   She loves her job   Has BSW   Working towards LPN and Charity fundraiser   Social Drivers of Corporate investment banker Strain: Low Risk  (01/18/2024)   Overall Financial Resource Strain (CARDIA)    Difficulty of Paying Living Expenses: Not hard at all  Food Insecurity: No Food Insecurity (01/18/2024)   Hunger Vital Sign    Worried About Running Out of Food in the Last Year: Never true    Ran Out of Food in the Last  Year: Never true  Transportation Needs: No Transportation Needs (01/18/2024)   PRAPARE - Administrator, Civil Service (Medical): No    Lack of Transportation (Non-Medical): No  Physical Activity: Insufficiently Active (01/18/2024)   Exercise Vital Sign    Days of Exercise per Week: 2 days    Minutes of Exercise per Session: 20 min  Stress: No Stress Concern Present (01/18/2024)   [employer] of Occupational Health - Occupational Stress Questionnaire    Feeling of Stress : Not at all  Social Connections: Socially Isolated (01/18/2024)   Social Connection and Isolation Panel [NHANES]    Frequency of Communication with Friends and Family:  Never    Frequency of Social Gatherings with Friends and Family: Never    Attends Religious Services: Never    Database administrator or Organizations: No    Attends Engineer, structural: Not on file    Marital Status: Divorced  Intimate Partner Violence: Not At Risk (01/18/2024)   Humiliation, Afraid, Rape, and Kick questionnaire    Fear of Current or Ex-Partner: No    Emotionally Abused: No    Physically Abused: No    Sexually Abused: No    Family History  Problem Relation Age of Onset   Hypertension Mother    Hyperlipidemia Father    Hypertension Father    CAD Brother    Heart disease Brother 19       cardiac arrest   Breast cancer Neg Hx     Past Surgical History:  Procedure Laterality Date   CESAREAN SECTION  11/17/1981   CESAREAN SECTION     IVC FILTER PLACEMENT ([facility] HX)     TONSILLECTOMY AND ADENOIDECTOMY      ROS: Review of Systems Negative except as stated above  PHYSICAL EXAM: BP 122/78 (BP Location: Left Arm, Patient Position: Sitting, Cuff Size: Normal)   Pulse 100   Temp 97.9 F (36.6 C) (Oral)   Ht 5\' 5"  (1.651 m)   Wt 212 lb (96.2 kg)   LMP 05/18/2016   SpO2 96%   BMI 35.28 kg/m   Physical Exam  General appearance - alert, well appearing, middle-age African-American female and in no  distress Mental status - normal mood, behavior, speech, dress, motor activity, and thought processes Neck - supple, no significant adenopathy Chest - clear to auscultation, no wheezes, rales or rhonchi, symmetric air entry Heart - normal rate, regular rhythm, normal S1, S2, no murmurs, rubs, clicks or gallops Extremities - peripheral pulses normal, no pedal edema, no clubbing or cyanosis Toenails: Discolored nails on both big toes. Diabetic Foot Exam - Simple   Simple Foot Form Diabetic Foot exam was performed with the following findings: Yes 01/18/2024  6:33 PM  Visual Inspection See comments: Yes Sensation Testing Intact to touch and monofilament testing bilaterally: Yes Pulse Check Posterior Tibialis and Dorsalis pulse intact bilaterally: Yes Comments Toenails of both big toes are thickened discolored.  Small callus noted on the medial aspect of the right big toe.         Latest Ref Rng & Units 12/15/2023    8:45 AM 04/29/2022    9:22 AM 01/21/2021    9:37 AM  CMP  Glucose 65 - 99 mg/dL 811  914  782   BUN 7 - 25 mg/dL 8  13  21    Creatinine 0.50 - 1.05 mg/dL 9.56  2.13  0.86   Sodium 135 - 146 mmol/L 137  139  140   Potassium 3.5 - 5.3 mmol/L 3.8  4.0  4.1   Chloride 98 - 110 mmol/L 99  101  104   CO2 20 - 32 mmol/L 26  22  26    Calcium 8.6 - 10.4 mg/dL 57.8  9.9  46.9   Total Protein 6.1 - 8.1 g/dL 7.8  7.5  7.0   Total Bilirubin 0.2 - 1.2 mg/dL 0.7  0.6  0.5   AST 10 - 35 U/L 29  20  20    ALT 6 - 29 U/L 24  19  17     Lipid Panel     Component Value Date/Time   CHOL 353 (H) 12/15/2023 0845  CHOL 253 (H) 11/16/2018 0956   TRIG 617 (H) 12/15/2023 0845   HDL 51 12/15/2023 0845   HDL 55 11/16/2018 0956   CHOLHDL 6.9 (H) 12/15/2023 0845   VLDL 24 03/07/2019 0618   LDLCALC  12/15/2023 0845     Comment:     . LDL cholesterol not calculated. Triglyceride levels greater than 400 mg/dL invalidate calculated LDL results. . Reference range: <100 . Desirable range <100  mg/dL for primary prevention;   <70 mg/dL for patients with CHD or diabetic patients  with > or = 2 CHD risk factors. Marland Kitchen LDL-C is now calculated using the Martin-Hopkins  calculation, which is a validated novel method providing  better accuracy than the Friedewald equation in the  estimation of LDL-C.  Horald Pollen et al. Lenox Ahr. 1610;960(45): 2061-2068  (http://education.[facility].com/faq/FAQ164)     CBC    Component Value Date/Time   WBC 10.9 (H) 12/15/2023 0845   RBC 5.12 (H) 12/15/2023 0845   HGB 15.6 (H) 12/15/2023 0845   HGB 14.0 11/16/2018 0956   HCT 45.4 (H) 12/15/2023 0845   HCT 41.6 11/16/2018 0956   PLT 269 12/15/2023 0845   PLT 275 11/16/2018 0956   MCV 88.7 12/15/2023 0845   MCV 88 11/16/2018 0956   MCH 30.5 12/15/2023 0845   MCHC 34.4 12/15/2023 0845   RDW 12.3 12/15/2023 0845   RDW 12.5 11/16/2018 0956   LYMPHSABS 4,685 (H) 04/29/2022 0922   MONOABS 1.4 (H) 03/07/2019 0332   EOSABS 120 12/15/2023 0845   BASOSABS 44 12/15/2023 0845    ASSESSMENT AND PLAN: 1. Type 2 diabetes mellitus with obesity (HCC) (Primary) Not at goal. Patient is trying to be mindful of carb intake. Continue metformin 500 mg twice a day.  GLP-1 agonist would be good in helping to curb her appetite and bring about some weight loss.  However she has had hospitalization in the past for pancreatitis thought to have been due to the medicine that she was prescribed for HIV that has since been discontinued.  I am a bit cautious adding a GLP-1 agonist and would like to get endocrinology opinion on this.  In the meantime, starting daily dose of Lantus 12 units at bedtime.  Printed information given on signs and symptoms of hypoglycemia.  She is agreeable to trying continuous glucose monitoring.  Prescription sent for freestyle libre sensor and reader. -Will get her in with clinical pharmacist to go over insulin administration. - POCT glycosylated hemoglobin (Hb A1C) - POCT glucose (manual  entry) - Ambulatory referral to Endocrinology - Microalbumin / creatinine urine ratio - insulin Lantus 100 unit/mL Solostar; Inject 12 Units into the skin at bedtime.  Dispense: 15 mL; Refill: 11 - Insulin Pen Needle (PEN NEEDLES) 31G X 8 MM MISC; UAD  Dispense: 100 each; Refill: 6 - Continuous Glucose Sensor (FREESTYLE LIBRE 3 PLUS SENSOR) MISC; Change sensor every 15 days.  Dispense: 2 each; Refill: 11 - Continuous Glucose Receiver (FREESTYLE LIBRE 3 READER) DEVI; UAD to check blood sugar  Dispense: 1 each; Refill: 0  2. Diabetes mellitus treated with oral medication (HCC) 3. Insulin long-term use (HCC) See #1 above.  4. Hypertension associated with type 2 diabetes mellitus (HCC) At goal.  Continue Norvasc, lisinopril and spironolactone. - amLODipine (NORVASC) 10 MG tablet; Take 1 tablet (10 mg total) by mouth daily.  Dispense: 90 tablet; Refill: 1 - spironolactone (ALDACTONE) 100 MG tablet; Take 1 tablet (100 mg total) by mouth daily. Please keep upcoming appt for additional refills.  Dispense: 90 tablet; Refill: 1 - lisinopril (ZESTRIL) 10 MG tablet; Take 1.5 tablets (15 mg total) by mouth daily.  Dispense: 135 tablet; Refill: 1  5. Hyperlipidemia associated with type 2 diabetes mellitus (HCC) Recommend that she starts the rosuvastatin.  Liver function intermittently.  Advised that the medicine may also cause muscle cramps/aches encouraged to let me know.  6. HIV disease (HCC) Plugged in with care.  Pliant with taking Descovy and Doravirine  7. Onychomycosis -Discussed the use of oral Lamisil with benefits and risks versus topical.  Given that she will be starting rosuvastatin, medication that can potentially affect the liver, she decided to hold off on oral Lamisil.  She will try topical first for several months.  I went over with her how to use the Penlac solution. - ciclopirox (PENLAC) 8 % solution; Apply topically at bedtime. Apply over nail and surrounding skin. Apply daily over  previous coat. After seven (7) days, may remove with alcohol and continue cycle.  Dispense: 6.6 mL; Refill: 2  8. Need for shingles vaccine Second Shingrix vaccine given today.  Advised that the vaccine can cause some soreness and redness at the injection site.  9. Need for vaccination against Streptococcus pneumoniae - PNEUMOCOCCAL CONJUGATE VACCINE 15-VALENT    Patient was given the opportunity to ask questions.  Patient verbalized understanding of the plan and was able to repeat key elements of the plan.   This documentation was completed using Paediatric nurse.  Any transcriptional errors are unintentional.  Orders Placed This Encounter  Procedures   PNEUMOCOCCAL CONJUGATE VACCINE 15-VALENT   Varicella-zoster vaccine IM   Microalbumin / creatinine urine ratio   Ambulatory referral to Endocrinology   POCT glycosylated hemoglobin (Hb A1C)   POCT glucose (manual entry)     Requested Prescriptions   Signed Prescriptions Disp Refills   amLODipine (NORVASC) 10 MG tablet 90 tablet 1    Sig: Take 1 tablet (10 mg total) by mouth daily.   spironolactone (ALDACTONE) 100 MG tablet 90 tablet 1    Sig: Take 1 tablet (100 mg total) by mouth daily. Please keep upcoming appt for additional refills.   lisinopril (ZESTRIL) 10 MG tablet 135 tablet 1    Sig: Take 1.5 tablets (15 mg total) by mouth daily.   ciclopirox (PENLAC) 8 % solution 6.6 mL 2    Sig: Apply topically at bedtime. Apply over nail and surrounding skin. Apply daily over previous coat. After seven (7) days, may remove with alcohol and continue cycle.   Insulin Pen Needle (PEN NEEDLES) 31G X 8 MM MISC 100 each 6    Sig: UAD   Continuous Glucose Sensor (FREESTYLE LIBRE 3 PLUS SENSOR) MISC 2 each 11    Sig: Change sensor every 15 days.   Continuous Glucose Receiver (FREESTYLE LIBRE 3 READER) DEVI 1 each 0    Sig: UAD to check blood sugar   insulin glargine (LANTUS SOLOSTAR) 100 UNIT/ML Solostar Pen 15 mL 11     Sig: Inject 12 Units into the skin daily.    Return in about 4 months (around 05/19/2024) for Give appt with Franky Macho this wk for insulin teaching and 4 weeks with clinical pharmacist for DM.  Jonah Blue, MD, FACP

## 2024-01-18 NOTE — Patient Instructions (Signed)
 Metformin 500 mg twice a day. Start Lantus insulin 12 units at bedtime. I have sent the prescription for the continuous glucose monitor.  Hypoglycemia Hypoglycemia is when the amount of sugar, or glucose, in your blood is too low. Low blood sugar can happen if you have diabetes or if you don't have diabetes. It may be an emergency. What are the causes? Low blood sugar happens most often in people who have diabetes. It may be caused by: Diabetes medicine. Not eating enough, or not eating often enough. Being more active than normal. If you don't have diabetes, you may still get low blood sugar if: There's a tumor in your pancreas. A tumor is a growth of cells that isn't normal. You don't eat enough, or you fast. Fasting is when you don't eat for long periods at a time. You have a bad infection or illness. You have problems after weight loss surgery. You have kidney or liver problems. You take certain medicines. What increases the risk? You're more likely to have low blood sugar if: You have diabetes and take medicine for it. You drink a lot of alcohol. You get sick. What are the signs or symptoms? Mild Hunger or feeling like you may vomit. Sweating and feeling cold to the touch. Feeling dizzy or light-headed. Being sleepy or having trouble sleeping. A headache. Blurry vision. Mood changes. These include feeling worried, nervous, or easily annoyed. Moderate Feeling confused. Changes in the way you act. Weakness. An uneven heartbeat. Very bad Having very low blood sugar is an emergency. It can cause: Fainting. Seizures. A coma. Death. How is this diagnosed?  Low blood sugar can be found with a blood test. This test tells you how much sugar is in your blood. It's done while you're having symptoms. Your health care provider may also do an exam and look at your medical history. How is this treated? Treating low blood sugar If you have low blood sugar, eat or drink something  with sugar in it right away. The food or drink should have 15 grams of a fast-acting carbohydrate (carb). Options include: 4 oz (120 mL) of fruit juice. 4 oz (120 mL) of soda (not diet soda). A few pieces of hard candy. Check food labels to see how many pieces to eat. 1 Tbsp (15 mL) of sugar or honey. 4 glucose tablets. 1 tube of glucose gel. Treating low blood sugar if you have diabetes Talk with your provider about how much carb you should take. If you're alert and can swallow safely, you may follow the 15:15 rule: Take 15 grams of a fast-acting carb. Check your blood sugar 15 minutes after you take the carb. If your blood sugar is still at or below 70 mg/dL (3.9 mmol/L), take 15 grams of a carb again. If your blood sugar doesn't go above 70 mg/dL (3.9 mmol/L) after 3 tries, get help right away. After your blood sugar goes back to normal, eat a meal or a snack within 1 hour. Always keep 15 grams of a fast-acting carb with you. This could be: 4 glucose tablets. A few pieces of hard candy. 1 Tbsp (15 mL) of honey or sugar. 1 tube of glucose gel. Treating very low blood sugar If your blood sugar is less than 54 mg/dL (3 mmol/L), it's an emergency. Get help right away. If you can't eat or drink, you will need to be given glucagon. A family member or friend should learn how to check your blood sugar and give you  glucagon. Ask your provider if you should keep a glucagon kit at home. You may also need to be treated in a hospital. Follow these instructions at home: If you have diabetes: Always keep a fast-acting carb (15 grams) with you. Follow your diabetes care plan. Make sure you: Know the symptoms of low blood sugar. Check your blood sugar as often as told. Always check it before and after you exercise. Always check your blood sugar before you drive. Take your medicines as told. Eat on time. Do not skip meals. Share your diabetes care plan with: Your work or school. The people you  live with. Wear an alert bracelet or carry a card that says you have diabetes. General instructions If you drink alcohol: Limit how much you have to: 0-1 drink a day if you're female. 0-2 drinks a day if you're female. Know how much alcohol is in your drink. In the [place] one drink is one 12 oz bottle of beer (355 mL), one 5 oz glass of wine (148 mL), or one 1 oz glass of hard liquor (44 mL). Be sure to eat food when you drink alcohol. Be sure to check your blood sugar after you drink. Alcohol may lead to low blood sugar later. Where to find more information American Diabetes Association (ADA): diabetes.org Contact a health care provider if: You have low blood sugar often. You have diabetes and are having trouble keeping your blood sugar in the right range. Get help right away if: You can't get your blood sugar above 70 mg/dL (3.9 mmol/L) after 3 tries. Your blood sugar is below 54 mg/dL (3 mmol/L). You have a seizure. You faint. These symptoms may be an emergency. Call 911 right away. Do not wait to see if the symptoms will go away. Do not drive yourself to the hospital. This information is not intended to replace advice given to you by your health care provider. Make sure you discuss any questions you have with your health care provider. Document Revised: 08/06/2023 Document Reviewed: 01/21/2023 Elsevier Patient Education  2024 [employer].

## 2024-01-19 ENCOUNTER — Telehealth: Payer: Self-pay

## 2024-01-19 ENCOUNTER — Other Ambulatory Visit: Payer: Self-pay

## 2024-01-19 ENCOUNTER — Other Ambulatory Visit (HOSPITAL_COMMUNITY): Payer: Self-pay

## 2024-01-19 ENCOUNTER — Other Ambulatory Visit: Payer: Self-pay | Admitting: Internal Medicine

## 2024-01-19 ENCOUNTER — Telehealth: Payer: Self-pay | Admitting: Internal Medicine

## 2024-01-19 DIAGNOSIS — E1169 Type 2 diabetes mellitus with other specified complication: Secondary | ICD-10-CM

## 2024-01-19 MED ORDER — INSULIN GLARGINE-YFGN 100 UNIT/ML ~~LOC~~ SOPN
12.0000 [IU] | PEN_INJECTOR | Freq: Every day | SUBCUTANEOUS | 3 refills | Status: DC
  Filled 2024-01-19: qty 9, 75d supply, fill #0

## 2024-01-19 NOTE — Telephone Encounter (Signed)
 Patient given contact information for Placed in [facility] Endocrin[facility] Ph# [phone] Fax # [phone]. [address] [place], [place] [address]

## 2024-01-19 NOTE — Telephone Encounter (Signed)
 Copied from CRM [id number]. Topic: Referral - Request for Referral >> Jan 19, 2024  4:03 PM Priscille Loveless wrote: Did the patient discuss referral with their provider in the last year? Yes   Appointment offered? Yes  Type of order/referral and detailed reason for visit: Endocrinology   Preference of office, provider, location: No  If referral order, have you been seen by this specialty before? No, just a group that you are affiliated with.  (If Yes, this issue or another issue? When? Where?  Can we respond through MyChart? No

## 2024-01-19 NOTE — Telephone Encounter (Signed)
 Pt called was wondering when you were going to call her back

## 2024-01-20 ENCOUNTER — Other Ambulatory Visit: Payer: Self-pay

## 2024-01-20 NOTE — Telephone Encounter (Signed)
 Call placed to patient. Left HIPAA-compliant VM informing pt that we sent in [facility] as insurance prefers this product.

## 2024-01-21 ENCOUNTER — Other Ambulatory Visit (HOSPITAL_COMMUNITY): Payer: Self-pay

## 2024-01-22 ENCOUNTER — Telehealth: Payer: Self-pay | Admitting: Internal Medicine

## 2024-01-22 ENCOUNTER — Other Ambulatory Visit: Payer: Self-pay

## 2024-01-22 NOTE — Telephone Encounter (Signed)
 Call placed to patient yesterday afternoon around 6:30 PM.  I left a voicemail message informing of who I am and that I was calling to address her concerns from MyChart message.  Since I was unable to reach her, I responded to her message via MyChart.

## 2024-01-25 ENCOUNTER — Other Ambulatory Visit (HOSPITAL_COMMUNITY): Payer: Self-pay

## 2024-01-25 NOTE — Telephone Encounter (Signed)
 I called patient and left voicemail about available times

## 2024-01-26 ENCOUNTER — Other Ambulatory Visit (HOSPITAL_COMMUNITY): Payer: Self-pay

## 2024-01-26 ENCOUNTER — Other Ambulatory Visit: Payer: Self-pay

## 2024-01-26 ENCOUNTER — Other Ambulatory Visit: Payer: Self-pay | Admitting: Infectious Disease

## 2024-01-26 DIAGNOSIS — B20 Human immunodeficiency virus [HIV] disease: Secondary | ICD-10-CM

## 2024-01-26 MED ORDER — DESCOVY 200-25 MG PO TABS
1.0000 | ORAL_TABLET | Freq: Every day | ORAL | 10 refills | Status: DC
  Filled 2024-01-26: qty 30, 30d supply, fill #0
  Filled 2024-02-25: qty 30, 30d supply, fill #1
  Filled 2024-03-28 (×2): qty 30, 30d supply, fill #2
  Filled 2024-04-08 – 2024-05-10 (×2): qty 30, 30d supply, fill #3
  Filled 2024-06-07: qty 30, 30d supply, fill #4
  Filled 2024-06-28: qty 30, 30d supply, fill #5
  Filled 2024-07-29: qty 30, 30d supply, fill #6
  Filled 2024-08-29: qty 30, 30d supply, fill #7
  Filled 2024-09-29 (×2): qty 30, 30d supply, fill #8
  Filled 2024-10-21: qty 30, 30d supply, fill #9
  Filled 2024-11-15: qty 30, 30d supply, fill #10

## 2024-01-26 NOTE — Progress Notes (Signed)
.  Specialty Pharmacy Refill Coordination Note  Kelbie Ellanore Vanhook is a 63 y.o. female contacted today regarding refills of specialty medication(s) Doravirine (PIFELTRO); Emtricitabine-Tenofovir AF (Descovy)   Patient requested Delivery   Delivery date: 02/01/24   Verified address: [address]   [place] [address]   Medication will be filled on 03.14.25.   Sent refill request for descovy.

## 2024-01-28 ENCOUNTER — Other Ambulatory Visit (HOSPITAL_COMMUNITY): Payer: Self-pay

## 2024-01-28 ENCOUNTER — Other Ambulatory Visit: Payer: Self-pay

## 2024-01-28 ENCOUNTER — Other Ambulatory Visit (HOSPITAL_COMMUNITY)
Admission: RE | Admit: 2024-01-28 | Discharge: 2024-01-28 | Disposition: A | Discharge status: Home / Self Care | Source: Ambulatory Visit | Attending: Infectious Diseases | Admitting: Infectious Diseases

## 2024-01-28 ENCOUNTER — Ambulatory Visit (INDEPENDENT_AMBULATORY_CARE_PROVIDER_SITE_OTHER): Payer: 59 | Admitting: Infectious Diseases

## 2024-01-28 VITALS — BP 129/79 | HR 99 | Temp 98.1°F | Wt 214.0 lb

## 2024-01-28 DIAGNOSIS — N952 Postmenopausal atrophic vaginitis: Secondary | ICD-10-CM | POA: Diagnosis not present

## 2024-01-28 DIAGNOSIS — Z124 Encounter for screening for malignant neoplasm of cervix: Secondary | ICD-10-CM | POA: Insufficient documentation

## 2024-01-28 MED ORDER — ESTRADIOL 10 MCG VA TABS
ORAL_TABLET | VAGINAL | 0 refills | Status: AC
  Filled 2024-01-28: qty 18, 30d supply, fill #0

## 2024-01-28 MED ORDER — PREMARIN 0.625 MG/GM VA CREA
1.0000 | TOPICAL_CREAM | VAGINAL | 5 refills | Status: DC
Start: 2024-01-28 — End: 2024-01-28
  Filled 2024-01-28: qty 30, 84d supply, fill #0

## 2024-01-28 NOTE — Assessment & Plan Note (Signed)
 Recommended topical estrogen cream however insurance will not cover and cost is > $400.  Vagifem suppositories 10 mg may be cheaper - insert nightly for 2 weeks then decrease to 2x weekly

## 2024-01-28 NOTE — Patient Instructions (Addendum)
  [employer] -  [email or web address]  I think considering the GLP-1 injections is very reasonable to talk with the endocrinologist about - Mounjaro I have heard from patients is more tolerable than Ozempic regarding side effects. But the weight loss associated with them is comparable to some of the surgical weight loss outcomes.   Schedule a follow up with Dr. Daiva Eves some time in October please.

## 2024-01-28 NOTE — Progress Notes (Signed)
      SUBJECTIVE    Wanda Kane is a 63 y.o. female here for pap smear.   Review of Systems: Current GYN complaints or concerns: vaginal dryness and discomfort with penetration. Last pap smear 1 year ago with negative cytology and negative HPV.  Marland Kitchen  Past Medical History:  Diagnosis Date   Anxiety    Depression    DVT (deep venous thrombosis) (HCC) 11/2014   Fatty liver    Fibroids 2010   Genital herpes 08/12/2017   High cholesterol    HIV infection (HCC) 1994   Hypertension 1994   Medical history non-contributory    Microalbuminuria due to type 2 diabetes mellitus (HCC) 11/04/2020   Myositis 03/09/2019   Pancreatitis 03/09/2019   Pancreatitis    Pre-diabetes    Type 2 diabetes mellitus with hyperglycemia (HCC) 03/03/2019   diagnosed in December 2019   Vaginal bleeding 12/2014   due to xarelto    Gynecologic History: G1P1001  Patient's last menstrual period was 05/18/2016. Contraception: post menopausal status Last Pap: 01/26/23. Results were: normal    Objective  Physical Exam - chaperone present  Constitutional: Well developed, well nourished, no acute distress. She is alert and oriented x3.  Pelvic: External genitalia is normal in appearance. The vagina shows signs of atrophy. The cervix is bulbous and easily visualized. No CMT, normal expected cervical mucus present.     Assessment & Plan:    Problem List Items Addressed This Visit       Unprioritized   Screening for cervical cancer   Relevant Orders   Cytology - PAP( [facility])   Vaginal atrophy - Primary   Recommended topical estrogen cream however insurance will not cover and cost is > $400.  Vagifem suppositories 10 mg may be cheaper - insert nightly for 2 weeks then decrease to 2x weekly         No orders of the defined types were placed in this encounter.   Meds ordered this encounter  Medications   DISCONTD: conjugated estrogens (PREMARIN) vaginal cream    Sig: Place  applicatorful vaginally 2 (two) times a week at night as directed    Dispense:  42.5 g    Refill:  5    Fu with Dr. Daiva Eves in October.    Rexene Alberts, MSN, NP-C [facility] for Infectious Disease [facility]  [email or web address] Pager: [phone] Office: [phone] [facility] Main Line: [phone] *Secure Chat Communication Welcome

## 2024-01-29 ENCOUNTER — Telehealth (HOSPITAL_BASED_OUTPATIENT_CLINIC_OR_DEPARTMENT_OTHER): Admitting: Internal Medicine

## 2024-01-29 ENCOUNTER — Other Ambulatory Visit (HOSPITAL_COMMUNITY): Payer: Self-pay

## 2024-01-29 ENCOUNTER — Encounter: Payer: Self-pay | Admitting: Internal Medicine

## 2024-01-29 ENCOUNTER — Other Ambulatory Visit: Payer: Self-pay

## 2024-01-29 DIAGNOSIS — Z7984 Long term (current) use of oral hypoglycemic drugs: Secondary | ICD-10-CM

## 2024-01-29 DIAGNOSIS — E1169 Type 2 diabetes mellitus with other specified complication: Secondary | ICD-10-CM

## 2024-01-29 DIAGNOSIS — Z794 Long term (current) use of insulin: Secondary | ICD-10-CM

## 2024-01-29 DIAGNOSIS — E669 Obesity, unspecified: Secondary | ICD-10-CM

## 2024-01-29 MED ORDER — INSULIN LISPRO (1 UNIT DIAL) 100 UNIT/ML (KWIKPEN)
PEN_INJECTOR | SUBCUTANEOUS | 1 refills | Status: AC
  Filled 2024-01-29: qty 6, 90d supply, fill #0

## 2024-01-29 NOTE — Progress Notes (Signed)
 Virtual Visit via Video Note  I connected with Wanda Kane on 01/29/2024 at 8:14 a.m by a video enabled telemedicine application and verified that I am speaking with the correct person using two identifiers.  Location: Patient: home Provider: Office   I discussed the limitations of evaluation and management by telemedicine and the availability of in person appointments. The patient expressed understanding and agreed to proceed.  History of Present Illness: hx of HIV (followed by ID with undetected viral load), HTN, DM, obesity, fibroid, HL,  pap with pos HPV, DVT with filter, pancreatitis (02/2019 through to be due to med Julu   This visit today was to discuss and clarify recommendation for starting insulin.  Patient seen by me earlier this month having not been seen in over a year.  At that time A1c was 12.3.  She was supposed to have been on metformin 1 g twice a day but with taking 500 mg twice a day because of diarrhea with the higher dose.  She wanted to try a GLP-1 agonist agent but has history of pancreatitis requiring hospitalization in the past that may have been due to one of her antiviral medicines. I recommended referral to endocrinology to get an opinion about whether it is safe to start GLP-1 agonist given that history.  In the meantime, I had recommended starting once daily long-acting insulin with metformin 500 mg twice a day until she sees the endocrinologist.  Plan was to use the long-acting insulin short-term until she sees the endocrinologist. -Patient subsequently sent me a MyChart message indicating some misunderstanding that she thought we had discussed putting her on short acting insulin.  I think she misunderstood my say putting her on Lantus short term until seen by endocrinology.  I explained to her via MyChart the difference between long-acting insulin like Lantus versus mealtime insulin like NovoLog or Humalog.  She tells me today that she prefers to do mealtime  insulin because she only eats twice a day in the afternoon and then before going to work.   -She sent back the long acting insulin that was delivered to her home from [facility].  I asked whether she spoke with the pharmacy to see whether they would accept the prescription back.  She tells me she plans to go by there today. -She did not get the continuous glucose monitor.  States that she does not mind sticking herself.  Checking blood sugars in the mornings before breakfast and some of her recent numbers have been 175, 183, 165.  She started taking metformin 1 g twice a day again with subsequent diarrhea/loose stools.  States she decided to go back to the 1 g twice a day because of her apprehension about being on insulin.  Actively working on improving her diet.  States that she has cut back on carbs especially bread. -Appointment scheduled with the endocrinologist for April for thyroid.  Reports having Pap smear yesterday that was done by an NP.  States the NP told her that instead of receiving the pneumonia 15 vaccine on last visit with me, she should have received the pneumonia 20. Pt had PCV 23 several yrs ago.   Outpatient Encounter Medications as of 01/29/2024  Medication Sig   amLODipine (NORVASC) 10 MG tablet Take 1 tablet (10 mg total) by mouth daily.   B-Complex-C TABS Take 1 tablet by mouth daily.   ciclopirox (PENLAC) 8 % solution Apply topically at bedtime. Apply over nail and surrounding skin. Apply daily  over previous coat. After seven (7) days, may remove with alcohol and continue cycle.   Continuous Glucose Receiver (FREESTYLE LIBRE 3 READER) DEVI UAD to check blood sugar   Continuous Glucose Sensor (FREESTYLE LIBRE 3 PLUS SENSOR) MISC Change sensor every 15 days.   doravirine (PIFELTRO) 100 MG TABS tablet Take 1 tablet (100 mg total) by mouth daily.   emtricitabine-tenofovir AF (DESCOVY) 200-25 MG tablet Take 1 tablet by mouth daily.   Estradiol 10 MCG TABS vaginal tablet  Place 1 tablet (10 mcg total) vaginally at bedtime for 14 days, THEN 1 tablet (10 mcg total) 2 (two) times a week for 16 days.   insulin glargine-yfgn (SEMGLEE) 100 UNIT/ML Pen Inject 12 Units into the skin at bedtime.   Insulin Pen Needle (PEN NEEDLES) 31G X 8 MM MISC UAD   lisinopril (ZESTRIL) 10 MG tablet Take 1.5 tablets (15 mg total) by mouth daily.   metFORMIN (GLUCOPHAGE) 1000 MG tablet Take 1 tablet (1,000 mg total) by mouth 2 (two) times daily with a meal. Please keep upcoming appt for additional refills.   Multiple Vitamin (MULTIVITAMIN) capsule Take 1 capsule by mouth daily.   rosuvastatin (CRESTOR) 20 MG tablet Take 1 tablet (20 mg total) by mouth daily.   spironolactone (ALDACTONE) 100 MG tablet Take 1 tablet (100 mg total) by mouth daily. Please keep upcoming appt for additional refills.   No facility-administered encounter medications on file as of 01/29/2024.      Observations/Objective: Older African-American female in NAD  Assessment and Plan: 1. Type 2 diabetes mellitus with obesity (HCC) (Primary) Went over the difference between long-acting insulin and mealtime insulin and why I was recommending the long-acting insulin with metformin - once a day administration vs 2-3x/day with mealtime insulin.  Having explained the difference to her, patient states she would prefer doing just the mealtime insulin with her 2 meals that she has a day.  Will put her on Humalog 2 to 3 units subcutaneously with the 2 largest meals of the day.  Advised that if the meal is large, she can do 3 units but if it is a small meal she can do 2 units instead.  Advised that mealtime insulin acts very quickly and should be taken within 5 minutes of eating.  Do not take it if she does not intend to eat within that time frame.  Went over signs and symptoms of hypoglycemia and how to treat.  Would recommend decreasing the metformin back to 500 mg twice a day as was discussed on previous visit since 1 gram causes  significant diarrhea.  Patient requested prescription to be sent to [facility]. Semglee insulin removed from the medication list since she does not wish to take the long-acting insulin. Keep upcoming appointment with the endocrinologist next month. - insulin lispro (HUMALOG KWIKPEN) 100 UNIT/ML KwikPen; 2-3 units subcut with the 2 largest meals of the day  Dispense: 15 mL; Refill: 1  Advised patient that since she had pneumonia 23 vaccine several years ago, she was due for the pneumonia 15 or 20 vaccine.  We gave the 15.  Follow Up Instructions: As previously scheduled.   I discussed the assessment and treatment plan with the patient. The patient was provided an opportunity to ask questions and all were answered. The patient agreed with the plan and demonstrated an understanding of the instructions.   The patient was advised to call back or seek an in-person evaluation if the symptoms worsen or if the condition fails  to improve as anticipated.  I spent 20 minutes dedicated to the care of this patient on the date of this encounter to include previsit review of of my last note and patient messages, face-to-face time with patient discussing diagnosis and management and post visit entering of orders.  This note has been created with Education officer, environmental. Any transcriptional errors are unintentional.  Jonah Blue, MD

## 2024-02-01 ENCOUNTER — Telehealth: Payer: Self-pay | Admitting: *Deleted

## 2024-02-01 NOTE — Telephone Encounter (Signed)
 Copied from CRM [id number]. Topic: Appointments - Scheduling Inquiry for Clinic >> Jan 29, 2024  2:22 PM Marlow Baars wrote: Reason for CRM: The patient called in stating she would like a sooner appt with Franky Macho to go over how to take her insulin lispro (HUMALOG KWIKPEN) 100 UNIT/ML KwikPen. Please assist her as her appt is not with Franky Macho for a few more weeks and she has the prescription now. She states someone can call her or text her as she does work [employer].

## 2024-02-02 ENCOUNTER — Telehealth: Payer: Self-pay | Admitting: Internal Medicine

## 2024-02-02 LAB — CYTOLOGY - PAP
Chlamydia: NEGATIVE
Comment: NEGATIVE
Comment: NEGATIVE
Comment: NORMAL
Diagnosis: NEGATIVE
Diagnosis: REACTIVE
High risk HPV: NEGATIVE
Neisseria Gonorrhea: NEGATIVE

## 2024-02-02 NOTE — Telephone Encounter (Signed)
 Contacted pt left vm sch appt open slot today at 11am. Other than that nothing until her visit 4/3

## 2024-02-09 ENCOUNTER — Other Ambulatory Visit: Payer: Self-pay

## 2024-02-09 DIAGNOSIS — E1165 Type 2 diabetes mellitus with hyperglycemia: Secondary | ICD-10-CM

## 2024-02-16 ENCOUNTER — Other Ambulatory Visit

## 2024-02-18 ENCOUNTER — Ambulatory Visit (INDEPENDENT_AMBULATORY_CARE_PROVIDER_SITE_OTHER): Admitting: "Endocrinology

## 2024-02-18 ENCOUNTER — Ambulatory Visit: Attending: Family Medicine | Admitting: Pharmacist

## 2024-02-18 ENCOUNTER — Encounter: Payer: Self-pay | Admitting: "Endocrinology

## 2024-02-18 ENCOUNTER — Other Ambulatory Visit: Payer: Self-pay

## 2024-02-18 ENCOUNTER — Encounter: Payer: Self-pay | Admitting: Pharmacist

## 2024-02-18 ENCOUNTER — Other Ambulatory Visit (HOSPITAL_COMMUNITY): Payer: Self-pay

## 2024-02-18 VITALS — BP 102/80 | HR 78 | Ht 65.0 in | Wt 213.0 lb

## 2024-02-18 DIAGNOSIS — E782 Mixed hyperlipidemia: Secondary | ICD-10-CM

## 2024-02-18 DIAGNOSIS — Z7984 Long term (current) use of oral hypoglycemic drugs: Secondary | ICD-10-CM

## 2024-02-18 DIAGNOSIS — Z794 Long term (current) use of insulin: Secondary | ICD-10-CM | POA: Diagnosis not present

## 2024-02-18 DIAGNOSIS — E1169 Type 2 diabetes mellitus with other specified complication: Secondary | ICD-10-CM | POA: Diagnosis not present

## 2024-02-18 DIAGNOSIS — E669 Obesity, unspecified: Secondary | ICD-10-CM

## 2024-02-18 DIAGNOSIS — E1165 Type 2 diabetes mellitus with hyperglycemia: Secondary | ICD-10-CM | POA: Diagnosis not present

## 2024-02-18 MED ORDER — FENOFIBRATE 48 MG PO TABS
48.0000 mg | ORAL_TABLET | Freq: Every day | ORAL | 1 refills | Status: AC
  Filled 2024-02-18: qty 90, 90d supply, fill #0
  Filled 2024-04-08 – 2024-07-15 (×2): qty 90, 90d supply, fill #1

## 2024-02-18 MED ORDER — METFORMIN HCL ER 500 MG PO TB24
1000.0000 mg | ORAL_TABLET | Freq: Two times a day (BID) | ORAL | 3 refills | Status: AC
  Filled 2024-02-18: qty 120, 30d supply, fill #0
  Filled 2024-04-08: qty 120, 30d supply, fill #1
  Filled 2024-07-15: qty 120, 30d supply, fill #2
  Filled 2024-10-07: qty 120, 30d supply, fill #3

## 2024-02-18 MED ORDER — PEN NEEDLES 32G X 4 MM MISC
1.0000 | Freq: Four times a day (QID) | 2 refills | Status: AC
  Filled 2024-02-18: qty 200, 50d supply, fill #0

## 2024-02-18 MED ORDER — INSULIN DEGLUDEC 100 UNIT/ML ~~LOC~~ SOPN
PEN_INJECTOR | SUBCUTANEOUS | 3 refills | Status: AC
  Filled 2024-02-18: qty 6, 50d supply, fill #0

## 2024-02-18 MED ORDER — DEXCOM G7 SENSOR MISC
1.0000 | 3 refills | Status: AC
  Filled 2024-02-18: qty 9, 90d supply, fill #0

## 2024-02-18 NOTE — Progress Notes (Signed)
 S:    PCP: Dr. Laural Benes  No chief complaint on file.  Patient arrives in good spirits. Presents for diabetes evaluation, education, and management. Patient was referred and last seen by Primary Care Provider in-person on 01/18/2024. She was scheduled with me to review insulin injection technique. PMH is significant for T2DM (w/ hx of microalbuminuria, obesity, hyperglycemia-associated symptoms), HTN, hepatic steatosis, HIV (followed by ID with undetected viral load), HLD, DVT with filter.  Regarding her diabetes, this is a longstanding diagnosis, first dx'd in 2020. No clinical ASCVD, CKD, or CHF. She has no hx of thyroid cancer. Her pancreatitis history is interesting. When she was admitted in 2020 with pancreatitis, it was suggested that her Juluca was the cause given no alcohol use and TG levels that were normal at the time. However, I note that while they were nl at time of admission, pt had a hx of persistently elevated Tgs in years prior to that admission ranging from 189-414 from 2013-2019.  Her A1c has ranged from 7.3 to 12.8 since 02/2019, with her most recent reading at 12.3% (01/18/2024). Review of her chart shows seldom PCP follow-up over this time period. However, pt acknowledges this today. Most recent in-person PCP follow-up was 01/18/2024. Patient expressed interest in starting GLP-1 RA therapy at that appt and was referred to Endocrine given her hx of pancreatitis. She was prescribed insulin in the meantime and instructed to see me for insulin injection technique which is her reason for seeing me today.   Of note, she established with Dr. Roosevelt Locks at [facility] this morning. They discussed the possibility of GLP-1 therapy, and per patient, it was recommended to proceed without at this time given her hx of pancreatitis and the increased risk of recurrence given her hypertriglyceridemia (TG level 617 on 12/15/2023).  Today, pt reports in good spirits. Not taking insulin yet as we have not had  the chance to meet and review injection technique. She has no complaints or symptoms to report today. She has questions regarding her pancreatitis and high triglyceride levels. She also wonders if something like Ozempic or Mounjaro could be used for her in the future.    Family/Social History:  - FHx: HTN, CAD, heart disease (cardiac arrest)  - Alcohol: no use reported  - Tobacco: never smoker   Insurance coverage/medication affordability: [employer].   Medication adherence reported with metformin but has not started [place] yet.   Current diabetes medications include: metformin 1000 mg XR BID (pt takes two 500 mg XR tablets BID), Tresiba 10 units daily  Patient denies hypoglycemic events.  Patient reported dietary habits:  - Eats 2 meals per day  - Has eliminated fast foods, sugars - Admits to dietary indiscretion prior to her latest A1c result   Patient-reported exercise habits:  - Daily (either formally at the gym or by walking on her own)   Patient denies nocturia (nighttime urination).  Patient denies neuropathy (nerve pain). Patient denies visual changes. Patient reports self foot exams.     O: Lab Results  Component Value Date   HGBA1C 12.3 (A) 01/18/2024   There were no vitals filed for this visit.  Lipid Panel     Component Value Date/Time   CHOL 353 (H) 12/15/2023 0845   CHOL 253 (H) 11/16/2018 0956   TRIG 617 (H) 12/15/2023 0845   HDL 51 12/15/2023 0845   HDL 55 11/16/2018 0956   CHOLHDL 6.9 (H) 12/15/2023 0845   VLDL 24 03/07/2019  5427   LDLCALC  12/15/2023 0845     Comment:     . LDL cholesterol not calculated. Triglyceride levels greater than 400 mg/dL invalidate calculated LDL results. . Reference range: <100 . Desirable range <100 mg/dL for primary prevention;   <70 mg/dL for patients with CHD or diabetic patients  with > or = 2 CHD risk factors. Marland Kitchen LDL-C is now calculated using the Martin-Hopkins  calculation, which is a  validated novel method providing  better accuracy than the Friedewald equation in the  estimation of LDL-C.  Horald Pollen et al. Lenox Ahr. 0623;762(83): 2061-2068  (http://education.[facility].com/faq/FAQ164)     Clinical Atherosclerotic Cardiovascular Disease (ASCVD): No  The ASCVD Risk score (Arnett DK, et al., 2019) failed to calculate for the following reasons:   The valid total cholesterol range is 130 to 320 mg/dL    A/P: Diabetes longstanding currently uncontrolled given her most recent A1c. She presents today mainly for review of insulin injection technique. She is not symptomatic from a hyperglycemic or hypoglycemic standpoint currently but does have a hx of symptoms. Patient is able to verbalize appropriate hypoglycemia management plan. Medication adherence appears optimal with metformin, however, she has not started her Evaristo Bury yet.. We reviewed injection technique and she is very thankful for the review. She is amenable to starting [place] today. Advised her to start this and continue metformin as prescribed. Regarding her pancreatitis, I do think it was a 1-time acute pancreatitis. However, the cause is debatable. I think what contributed more than Juluca was her longstanding hx of hypertriglyceridemia. On most recent check her TG level is >500. I think if we can normalize her TG level with a fibric acid derivative, we can minimize her risk of pancreatitis and may be able to consider GLP-1 or even dual GIP/GLP1 RA therapy in the future. -Start Tresiba 10 units daily.  -Patient was educated on the use of the Guinea-[place] pen. Reviewed necessary supplies and operation of the pen. Patient was able to demonstrate use. All questions and concerns were addressed. -Continued metformin 500 mg XR, two tablets (1000mg  total) BID.  -Extensively discussed pathophysiology of diabetes, recommended lifestyle interventions, dietary effects on blood sugar control -Counseled on s/sx of and management of  hypoglycemia -Next A1C anticipated 04/2024.    ASCVD risk - primary prevention in patient with diabetes. Last LDL is could not be calculated d/t high TG levels. I discussed the role of hypertriglyceridemia in the pathophysiology of pancreatitis. She is amenable to adding fenofibrate today to address this. She is already on high intensity statin therapy.  -Continued rosuvastatin 20 mg. -Started fenofibrate 48 mg daily. Pt counseled to monitor for any symptoms of myopathy.  -Plan for TG recheck in 4-8 weeks.  Written patient instructions provided.  Total time in face to face counseling 30 minutes.   Follow up with me in 1 month. Sees Endo 03/25/2024.   PCP: follow-up 05/19/2024  Butch Penny, PharmD, BCACP, CPP Clinical Pharmacist [facility] [phone]

## 2024-02-18 NOTE — Progress Notes (Signed)
 Outpatient Endocrinology Note Wanda Hilltop, MD  02/18/24   Wanda Kane [birth year] [id number]  Referring Provider: Marcine Matar, MD Primary Care Provider: Marcine Matar, MD Reason for consultation: Subjective   Assessment & Plan  Diagnoses and all orders for this visit:  Uncontrolled type 2 diabetes mellitus with hyperglycemia (HCC) -     Ambulatory referral to diabetic education  Long term (current) use of oral hypoglycemic drugs  Mixed hypercholesterolemia and hypertriglyceridemia  Other orders -     insulin degludec (TRESIBA) 100 UNIT/ML FlexTouch Pen; Inject 10 units once a day, increase by 1 unit every day until fasting blood sugar is less than 120. Stay on that dose. -     Insulin Pen Needle (PEN NEEDLES) 32G X 4 MM MISC; Use to inject insulin in the morning, at noon, in the evening, and at bedtime. -     metFORMIN (GLUCOPHAGE-XR) 500 MG 24 hr tablet; Take 2 tablets (1,000 mg total) by mouth 2 (two) times daily with a meal. -     Continuous Glucose Sensor (DEXCOM G7 SENSOR) MISC; Use as directed every 10 days for continuous glucose monitoring    Diabetes Type II complicated by hyperglycemia, No results found for: "GFR" Hba1c goal less than 7, current Hba1c is  Lab Results  Component Value Date   HGBA1C 12.3 (A) 01/18/2024   Will recommend the following: Metformin XR 500 mg 2 pills bid Insulin Tresiba 10 units qam, increase by 1 unit every day until fasting blood sugar is less than 120. Stay on that dose.   Patient is not interested I starting any other medication at this time  02/18/24: Wanda Kane was sent was another provider in 01/2024, I sent Dexcom   Ordered DM education  No known contraindications/side effects to any of above medications  Had pancreatitis - doesn't want GLP1   -Last LD and Tg are as follows: Lab Results  Component Value Date   [facility]  12/15/2023     Comment:     . LDL cholesterol not calculated. Triglyceride  levels greater than 400 mg/dL invalidate calculated LDL results. . Reference range: <100 . Desirable range <100 mg/dL for primary prevention;   <70 mg/dL for patients with CHD or diabetic patients  with > or = 2 CHD risk factors. Marland Kitchen LDL-C is now calculated using the Martin-Hopkins  calculation, which is a validated novel method providing  better accuracy than the Friedewald equation in the  estimation of LDL-C.  Horald Pollen et al. Lenox Ahr. 4098;119(14): 2061-2068  (http://education.[facility].com/faq/FAQ164)     Lab Results  Component Value Date   TRIG 617 (H) 12/15/2023   -On rosuvastatin 20 mg QD -Follow low fat diet and exercise   -Blood pressure goal <140/90 - Microalbumin/creatinine goal is < 30 -Last MA/Cr is as follows: No results found for: "MICROALBUR", "MALB24HUR" -on ACE/ARB lisinopril 10 mg every day  -diet changes including salt restriction -limit eating outside -counseled BP targets per standards of diabetes care -uncontrolled blood pressure can lead to retinopathy, nephropathy and cardiovascular and atherosclerotic heart disease  Reviewed and counseled on: -A1C target -Blood sugar targets -Complications of uncontrolled diabetes  -Checking blood sugar before meals and bedtime and bring log next visit -All medications with mechanism of action and side effects -Hypoglycemia management: rule of 15's, Glucagon Emergency Kit and medical alert ID -low-carb low-fat plate-method diet -At least 20 minutes of physical activity per day -Annual dilated retinal eye exam and foot exam -compliance and follow up needs -  follow up as scheduled or earlier if problem gets worse  Call if blood sugar is less than 70 or consistently above 250    Take a 15 gm snack of carbohydrate at bedtime before you go to sleep if your blood sugar is less than 100.    If you are going to fast after midnight for a test or procedure, ask your physician for instructions on how to  reduce/decrease your insulin dose.    Call if blood sugar is less than 70 or consistently above 250  -Treating a low sugar by rule of 15  (15 gms of sugar every 15 min until sugar is more than 70) If you feel your sugar is low, test your sugar to be sure If your sugar is low (less than 70), then take 15 grams of a fast acting Carbohydrate (3-4 glucose tablets or glucose gel or 4 ounces of juice or regular soda) Recheck your sugar 15 min after treating low to make sure it is more than 70 If sugar is still less than 70, treat again with 15 grams of carbohydrate          Don't drive the hour of hypoglycemia  If unconscious/unable to eat or drink by mouth, use glucagon injection or nasal spray baqsimi and call 911. Can repeat again in 15 min if still unconscious.  Return in about 4 weeks (around 03/17/2024).   I have reviewed current medications, nurse's notes, allergies, vital signs, past medical and surgical history, family medical history, and social history for this encounter. Counseled patient on symptoms, examination findings, lab findings, imaging results, treatment decisions and monitoring and prognosis. The patient understood the recommendations and agrees with the treatment plan. All questions regarding treatment plan were fully answered.  Wanda Hunter, MD  02/18/24    History of Present Illness Wanda Kane is a 63 y.o. year old female who presents for evaluation of Type II diabetes mellitus.  Isis Dayzee Trower was first diagnosed in 2063.   Diabetes education -  Home diabetes regimen: Metformin 1000 mg bid - has diarrhea   COMPLICATIONS -  MI/Stroke -  retinopathy -  neuropathy -  nephropathy  SYMPTOMS REVIEWED - Polyuria - Weight loss - Blurred vision  BLOOD SUGAR DATA Did not bring meter Checks every other other   Physical Exam  BP 102/80   Pulse 78   Ht 5\' 5"  (1.651 m)   Wt 213 lb (96.6 kg)   LMP 05/18/2016   SpO2 94%   BMI 35.45 kg/m     Constitutional: well developed, well nourished Head: normocephalic, atraumatic Eyes: sclera anicteric, no redness Neck: supple Lungs: normal respiratory effort Neurology: alert and oriented Skin: dry, no appreciable rashes Musculoskeletal: no appreciable defects Psychiatric: normal mood and affect Diabetic Foot Exam - Simple   No data filed      Current Medications Patient's Medications  New Prescriptions   CONTINUOUS GLUCOSE SENSOR (DEXCOM G7 SENSOR) MISC    Use as directed every 10 days for continuous glucose monitoring   INSULIN DEGLUDEC (TRESIBA) 100 UNIT/ML FLEXTOUCH PEN    Inject 10 units once a day, increase by 1 unit every day until fasting blood sugar is less than 120. Stay on that dose.   INSULIN PEN NEEDLE (PEN NEEDLES) 32G X 4 MM MISC    Use to inject insulin in the morning, at noon, in the evening, and at bedtime.   METFORMIN (GLUCOPHAGE-XR) 500 MG 24 HR TABLET    Take 2 tablets (  1,000 mg total) by mouth 2 (two) times daily with a meal.  Previous Medications   AMLODIPINE (NORVASC) 10 MG TABLET    Take 1 tablet (10 mg total) by mouth daily.   B-COMPLEX-C TABS    Take 1 tablet by mouth daily.   CICLOPIROX (PENLAC) 8 % SOLUTION    Apply topically at bedtime. Apply over nail and surrounding skin. Apply daily over previous coat. After seven (7) days, may remove with alcohol and continue cycle.   CONTINUOUS GLUCOSE RECEIVER (FREESTYLE LIBRE 3 READER) DEVI    UAD to check blood sugar   CONTINUOUS GLUCOSE SENSOR (FREESTYLE LIBRE 3 PLUS SENSOR) MISC    Change sensor every 15 days.   DORAVIRINE (PIFELTRO) 100 MG TABS TABLET    Take 1 tablet (100 mg total) by mouth daily.   EMTRICITABINE-TENOFOVIR AF (DESCOVY) 200-25 MG TABLET    Take 1 tablet by mouth daily.   ESTRADIOL 10 MCG TABS VAGINAL TABLET    Place 1 tablet (10 mcg total) vaginally at bedtime for 14 days, THEN 1 tablet (10 mcg total) 2 (two) times a week for 16 days.   INSULIN LISPRO (HUMALOG KWIKPEN) 100 UNIT/ML KWIKPEN     Inject 2-3 units subcutaneously with the 2 largest meals of the day   INSULIN PEN NEEDLE (PEN NEEDLES) 31G X 8 MM MISC    UAD   LISINOPRIL (ZESTRIL) 10 MG TABLET    Take 1.5 tablets (15 mg total) by mouth daily.   MULTIPLE VITAMIN (MULTIVITAMIN) CAPSULE    Take 1 capsule by mouth daily.   ROSUVASTATIN (CRESTOR) 20 MG TABLET    Take 1 tablet (20 mg total) by mouth daily.   SPIRONOLACTONE (ALDACTONE) 100 MG TABLET    Take 1 tablet (100 mg total) by mouth daily. Please keep upcoming appt for additional refills.  Modified Medications   No medications on file  Discontinued Medications   METFORMIN (GLUCOPHAGE) 1000 MG TABLET    Take 1 tablet (1,000 mg total) by mouth 2 (two) times daily with a meal. Please keep upcoming appt for additional refills.    Allergies Allergies  Allergen Reactions   Prilosec [Omeprazole] Other (See Comments)    Past Medical History Past Medical History:  Diagnosis Date   Anxiety    Depression    DVT (deep venous thrombosis) (HCC) 11/2014   Fatty liver    Fibroids 2010   Genital herpes 08/12/2017   High cholesterol    HIV infection (HCC) 1994   Hypertension 1994   Medical history non-contributory    Microalbuminuria due to type 2 diabetes mellitus (HCC) 11/04/2020   Myositis 03/09/2019   Pancreatitis 03/09/2019   Pancreatitis    Pre-diabetes    Type 2 diabetes mellitus with hyperglycemia (HCC) 03/03/2019   diagnosed in December 2019   Vaginal bleeding 12/2014   due to xarelto    Past Surgical History Past Surgical History:  Procedure Laterality Date   CESAREAN SECTION  11/17/1981   CESAREAN SECTION     IVC FILTER PLACEMENT ([facility] HX)     TONSILLECTOMY AND ADENOIDECTOMY      Family History family history includes CAD in her brother; Heart disease (age of onset: 57) in her brother; Hyperlipidemia in her father; Hypertension in her father and mother.  Social History Social History   Socioeconomic History   Marital status: Single    Spouse  name: Not on file   Number of children: Not on file   Years of education: college, BSW  Highest education level: Master's degree (e.g., MA, MS, MEng, MEd, MSW, MBA)  Occupational History   Occupation: Curator: [employer]  Tobacco Use   Smoking status: Never    Passive exposure: Never   Smokeless tobacco: Never  Vaping Use   Vaping status: Never Used  Substance and Sexual Activity   Alcohol use: No   Drug use: No   Sexual activity: Not Currently    Partners: Male    Comment: patient declined condoms  Other Topics Concern   Not on file  Social History Narrative   She works as a Psychologist, 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   She loves her job   Has BSW   Working towards LPN and Charity fundraiser   Social Drivers of Corporate investment banker Strain: Low Risk  (01/18/2024)   Overall Financial Resource Strain (CARDIA)    Difficulty of Paying Living Expenses: Not hard at all  Food Insecurity: No Food Insecurity (01/18/2024)   Hunger Vital Sign    Worried About Running Out of Food in the Last Year: Never true    Ran Out of Food in the Last Year: Never true  Transportation Needs: No Transportation Needs (01/18/2024)   PRAPARE - Administrator, Civil Service (Medical): No    Lack of Transportation (Non-Medical): No  Physical Activity: Insufficiently Active (01/18/2024)   Exercise Vital Sign    Days of Exercise per Week: 2 days    Minutes of Exercise per Session: 20 min  Stress: No Stress Concern Present (01/18/2024)   [employer] of Occupational Health - Occupational Stress Questionnaire    Feeling of Stress : Not at all  Social Connections: Socially Isolated (01/18/2024)   Social Connection and Isolation Panel [NHANES]    Frequency of Communication with Friends and Family: Never    Frequency of Social Gatherings with Friends and Family: Never    Attends Religious Services: Never    Database administrator or Organizations: No    Attends Engineer, structural: Not on file     Marital Status: Divorced  Intimate Partner Violence: Not At Risk (01/18/2024)   Humiliation, Afraid, Rape, and Kick questionnaire    Fear of Current or Ex-Partner: No    Emotionally Abused: No    Physically Abused: No    Sexually Abused: No    Lab Results  Component Value Date   HGBA1C 12.3 (A) 01/18/2024   HGBA1C 8.0 (A) 06/05/2022   HGBA1C 12.8 (A) 01/17/2022   Lab Results  Component Value Date   CHOL 353 (H) 12/15/2023   Lab Results  Component Value Date   HDL 51 12/15/2023   Lab Results  Component Value Date   [facility]  12/15/2023     Comment:     . LDL cholesterol not calculated. Triglyceride levels greater than 400 mg/dL invalidate calculated LDL results. . Reference range: <100 . Desirable range <100 mg/dL for primary prevention;   <70 mg/dL for patients with CHD or diabetic patients  with > or = 2 CHD risk factors. Marland Kitchen LDL-C is now calculated using the Martin-Hopkins  calculation, which is a validated novel method providing  better accuracy than the Friedewald equation in the  estimation of LDL-C.  Horald Pollen et al. Lenox Ahr. 4132;440(10): 2061-2068  (http://education.[facility].com/faq/FAQ164)    Lab Results  Component Value Date   TRIG 617 (H) 12/15/2023   Lab Results  Component Value Date   CHOLHDL 6.9 (H) 12/15/2023   Lab Results  Component Value Date   CREATININE 0.72 12/15/2023   No results found for: "GFR" No results found for: "MICROALBUR", "MALB24HUR"    Component Value Date/Time   NA 137 12/15/2023 0845   NA 144 11/16/2018 0956   K 3.8 12/15/2023 0845   CL 99 12/15/2023 0845   CO2 26 12/15/2023 0845   GLUCOSE 189 (H) 12/15/2023 0845   BUN 8 12/15/2023 0845   BUN 11 11/16/2018 0956   CREATININE 0.72 12/15/2023 0845   CALCIUM 10.2 12/15/2023 0845   PROT 7.8 12/15/2023 0845   PROT 7.2 11/16/2018 0956   ALBUMIN 2.8 (L) 03/11/2019 0636   ALBUMIN 4.5 11/16/2018 0956   AST 29 12/15/2023 0845   ALT 24 12/15/2023 0845   ALKPHOS 92  03/11/2019 0636   BILITOT 0.7 12/15/2023 0845   BILITOT 0.5 11/16/2018 0956   GFRNONAA 85 01/21/2021 0937   GFRAA 99 01/21/2021 0937      Latest Ref Rng & Units 12/15/2023    8:45 AM 04/29/2022    9:22 AM 01/21/2021    9:37 AM  BMP  Glucose 65 - 99 mg/dL 130  865  784   BUN 7 - 25 mg/dL 8  13  21    Creatinine 0.50 - 1.05 mg/dL 6.96  2.95  2.84   BUN/Creat Ratio 6 - 22 (calc) SEE NOTE:  NOT APPLICABLE  NOT APPLICABLE   Sodium 135 - 146 mmol/L 137  139  140   Potassium 3.5 - 5.3 mmol/L 3.8  4.0  4.1   Chloride 98 - 110 mmol/L 99  101  104   CO2 20 - 32 mmol/L 26  22  26    Calcium 8.6 - 10.4 mg/dL 13.2  9.9  44.0        Component Value Date/Time   WBC 10.9 (H) 12/15/2023 0845   RBC 5.12 (H) 12/15/2023 0845   HGB 15.6 (H) 12/15/2023 0845   HGB 14.0 11/16/2018 0956   HCT 45.4 (H) 12/15/2023 0845   HCT 41.6 11/16/2018 0956   PLT 269 12/15/2023 0845   PLT 275 11/16/2018 0956   MCV 88.7 12/15/2023 0845   MCV 88 11/16/2018 0956   MCH 30.5 12/15/2023 0845   MCHC 34.4 12/15/2023 0845   RDW 12.3 12/15/2023 0845   RDW 12.5 11/16/2018 0956   LYMPHSABS 4,685 (H) 04/29/2022 0922   MONOABS 1.4 (H) 03/07/2019 0332   EOSABS 120 12/15/2023 0845   BASOSABS 44 12/15/2023 0845     Parts of this note may have been dictated using voice recognition software. There may be variances in spelling and vocabulary which are unintentional. Not all errors are proofread. Please notify the Thereasa Parkin if any discrepancies are noted or if the meaning of any statement is not clear.

## 2024-02-18 NOTE — Patient Instructions (Addendum)

## 2024-02-25 ENCOUNTER — Other Ambulatory Visit: Payer: Self-pay

## 2024-02-25 ENCOUNTER — Other Ambulatory Visit (HOSPITAL_COMMUNITY): Payer: Self-pay

## 2024-02-25 NOTE — Progress Notes (Signed)
 Clinical Intervention Note  Clinical Intervention Notes: Patient reports starting fenofibrate, tresiba and metformin. No DDIs identified with Descovy/Pifeltro.   Clinical Intervention Outcomes: Prevention of an adverse drug event   Wanda Kane Specialty Pharmacist

## 2024-02-25 NOTE — Progress Notes (Signed)
 Specialty Pharmacy Refill Coordination Note  Wyona Tram Wrenn is a 63 y.o. female contacted today regarding refills of specialty medication(s) Doravirine (PIFELTRO); Emtricitabine-Tenofovir AF (Descovy)   Patient requested Delivery   Delivery date: 03/03/24   Verified address: [address]  [place] [address]   Medication will be filled on 03/02/24.

## 2024-02-25 NOTE — Progress Notes (Signed)
 Specialty Pharmacy Ongoing Clinical Assessment Note  Wanda Kane is a 63 y.o. female who is being followed by the specialty pharmacy service for RxSp HIV   Patient's specialty medication(s) reviewed today: Doravirine (PIFELTRO); Emtricitabine-Tenofovir AF (Descovy)   Missed doses in the last 4 weeks: 0   Patient/Caregiver did not have any additional questions or concerns.   Therapeutic benefit summary: Patient is achieving benefit   Adverse events/side effects summary: No adverse events/side effects   Patient's therapy is appropriate to: Continue    Goals Addressed             This Visit's Progress    Achieve Undetectable HIV Viral Load < 20   On track    Patient is on track. Patient will maintain adherence. Viral load undetectable long term.          Follow up:  6 months  Otto Herb Specialty Pharmacist

## 2024-03-02 ENCOUNTER — Other Ambulatory Visit: Payer: Self-pay

## 2024-03-25 ENCOUNTER — Ambulatory Visit: Admitting: "Endocrinology

## 2024-03-25 ENCOUNTER — Ambulatory Visit: Admitting: Pharmacist

## 2024-03-25 ENCOUNTER — Encounter (HOSPITAL_COMMUNITY): Payer: Self-pay

## 2024-03-28 ENCOUNTER — Other Ambulatory Visit (HOSPITAL_COMMUNITY): Payer: Self-pay

## 2024-03-28 ENCOUNTER — Other Ambulatory Visit: Payer: Self-pay

## 2024-03-28 NOTE — Progress Notes (Addendum)
 Specialty Pharmacy Refill Coordination Note  Wanda Kane is a 63 y.o. female contacted today regarding refills of specialty medication(s) Doravirine  (PIFELTRO ); Emtricitabine -Tenofovir  AF (Descovy )   Patient requested Delivery   Delivery date: 04/01/24   Verified address: [address]  [place] [address]   Medication will be filled on 03/31/24.

## 2024-03-31 ENCOUNTER — Ambulatory Visit: Admitting: "Endocrinology

## 2024-03-31 ENCOUNTER — Other Ambulatory Visit: Payer: Self-pay

## 2024-03-31 NOTE — Progress Notes (Signed)
 Clinical Intervention Note  Clinical Intervention Notes: Patient reports initiating metformin  ER. No DDIs identified with Descovy /Pifeltro .   Clinical Intervention Outcomes: Prevention of an adverse drug event   Rena Carnes Specialty Pharmacist

## 2024-04-01 ENCOUNTER — Ambulatory Visit: Admitting: Pharmacist

## 2024-04-06 ENCOUNTER — Other Ambulatory Visit: Payer: Self-pay

## 2024-04-06 ENCOUNTER — Other Ambulatory Visit (HOSPITAL_COMMUNITY): Payer: Self-pay

## 2024-04-06 NOTE — Progress Notes (Signed)
 Medication has been returned to [facility] by [employer]. No reason for unsuccessful delivery given. Left voice mail and sent MyChart message to patient to contact us  to confirm whether they would like to pick up at [facility] of if we should reship.

## 2024-04-06 NOTE — Progress Notes (Signed)
 Per Tamra, Medication has been returned to [facility] by [employer]. No reason for unsuccessful delivery given. Left voice mail and sent MyChart message to patient to contact us  to confirm whether they would like to pick up at [facility] of if we should reship.  Patient called [facility] back, stating that she would like the medication to be reshipped. Medication was returned to [facility] from [facility] on 5/21. Patient was informed that she should receive this supply on either 04/07/24 or 04/08/24. Patient expressed understanding.   New Tracking Number: [id number]

## 2024-04-07 ENCOUNTER — Other Ambulatory Visit (HOSPITAL_COMMUNITY): Payer: Self-pay

## 2024-04-08 ENCOUNTER — Other Ambulatory Visit (HOSPITAL_COMMUNITY): Payer: Self-pay

## 2024-04-09 ENCOUNTER — Other Ambulatory Visit (HOSPITAL_COMMUNITY): Payer: Self-pay

## 2024-04-10 ENCOUNTER — Other Ambulatory Visit: Payer: Self-pay

## 2024-04-12 ENCOUNTER — Other Ambulatory Visit (HOSPITAL_COMMUNITY): Payer: Self-pay

## 2024-04-12 ENCOUNTER — Other Ambulatory Visit: Payer: Self-pay

## 2024-04-12 NOTE — Progress Notes (Signed)
(  04/12/24 CA): Left patient a voicemail. Package has been returned for 2nd time. Patient has other medications ready for pick up at [facility]. Can either fill prescriptions for pick up, or we can set up a same-day courier. Patient should call back with her decision at [phone].

## 2024-04-12 NOTE — Progress Notes (Signed)
(  04/12/24 CA): Patient returned [facility] phone call. Patient has 5 outpatient meds that are ready for pick up and would like to pick up her specialty medications along with those.  Medication has already been paid for, and placed both SPEC meds in [place] tote on 5/27 for patient to pick up with other medications. Sticky note has been placed in the bag as well. Raenell Bump, [facility] has been made aware.   Will switch patient to [employer] for future deliveries.

## 2024-04-13 ENCOUNTER — Other Ambulatory Visit (HOSPITAL_COMMUNITY): Payer: Self-pay

## 2024-04-13 ENCOUNTER — Other Ambulatory Visit: Payer: Self-pay

## 2024-04-14 ENCOUNTER — Other Ambulatory Visit (HOSPITAL_COMMUNITY): Payer: Self-pay

## 2024-05-09 ENCOUNTER — Other Ambulatory Visit: Payer: Self-pay

## 2024-05-10 ENCOUNTER — Encounter (INDEPENDENT_AMBULATORY_CARE_PROVIDER_SITE_OTHER): Payer: Self-pay

## 2024-05-10 ENCOUNTER — Other Ambulatory Visit: Payer: Self-pay

## 2024-05-10 NOTE — Progress Notes (Signed)
 Specialty Pharmacy Refill Coordination Note  Wanda Kane is a 63 y.o. female contacted today regarding refills of specialty medication(s) Doravirine  (PIFELTRO ); Emtricitabine -Tenofovir  AF (Descovy )   Patient requested (Patient-Rptd) Delivery   Delivery date: 05/11/24   Verified address: (Patient-Rptd) [address] [place] [address]   Medication will be filled on 06.24.25.

## 2024-05-12 ENCOUNTER — Other Ambulatory Visit: Payer: Self-pay

## 2024-05-16 ENCOUNTER — Other Ambulatory Visit: Payer: Self-pay

## 2024-05-16 ENCOUNTER — Other Ambulatory Visit (HOSPITAL_COMMUNITY): Payer: Self-pay

## 2024-05-16 NOTE — Progress Notes (Signed)
 Patient was left a voice mail that her specialty medication was returned to the pharmacy. Medication will be sent back to [facility] until patient can be reached

## 2024-05-17 NOTE — Progress Notes (Addendum)
 Patient called back - remailing medication via [employer]. New tracking number: [id number]

## 2024-05-19 ENCOUNTER — Ambulatory Visit: Payer: Self-pay | Admitting: Internal Medicine

## 2024-06-07 ENCOUNTER — Encounter (INDEPENDENT_AMBULATORY_CARE_PROVIDER_SITE_OTHER): Payer: Self-pay

## 2024-06-07 ENCOUNTER — Other Ambulatory Visit: Payer: Self-pay | Admitting: Pharmacy Technician

## 2024-06-07 ENCOUNTER — Other Ambulatory Visit (HOSPITAL_COMMUNITY): Payer: Self-pay

## 2024-06-07 ENCOUNTER — Other Ambulatory Visit: Payer: Self-pay

## 2024-06-08 ENCOUNTER — Other Ambulatory Visit (HOSPITAL_COMMUNITY): Payer: Self-pay

## 2024-06-08 ENCOUNTER — Other Ambulatory Visit: Payer: Self-pay

## 2024-06-09 ENCOUNTER — Other Ambulatory Visit: Payer: Self-pay

## 2024-06-09 ENCOUNTER — Other Ambulatory Visit (HOSPITAL_COMMUNITY): Payer: Self-pay

## 2024-06-10 ENCOUNTER — Telehealth: Payer: Self-pay

## 2024-06-10 ENCOUNTER — Other Ambulatory Visit (HOSPITAL_COMMUNITY): Payer: Self-pay

## 2024-06-10 ENCOUNTER — Other Ambulatory Visit: Payer: Self-pay

## 2024-06-10 NOTE — Telephone Encounter (Signed)
 [facility] Patient Advocate Encounter   I was successful in securing patient a $5000.00 grant from [employer] ([employer]) to provide copayment coverage for Descovy  & Pifeltro .  This will make the out of pocket cost $0.00.     I have spoken with the patient.    The billing information is as follows and has been shared with [facility].           Patient knows to call the office with questions or concerns.  Arland Hutchinson, CPhT Specialty Pharmacy Patient [facility] Phone: [phone] Fax:  [phone]

## 2024-06-10 NOTE — Progress Notes (Signed)
 Specialty Pharmacy Refill Coordination Note  Genecis Mekiah Wahler is a 63 y.o. female contacted today regarding refills of specialty medication(s) Doravirine  (PIFELTRO ); Emtricitabine -Tenofovir  AF (Descovy )   Patient requested Delivery   Delivery date: 06/13/24   Verified address: [address], [place], [place] [address]   Medication will be filled on 06/10/24.  Per Chart -Arland Hutchinson; patient is approved for PAF and her script is $0.00. Patient have also been notified as well.

## 2024-06-28 ENCOUNTER — Other Ambulatory Visit: Payer: Self-pay

## 2024-06-28 ENCOUNTER — Other Ambulatory Visit (HOSPITAL_COMMUNITY): Payer: Self-pay

## 2024-06-28 ENCOUNTER — Encounter (INDEPENDENT_AMBULATORY_CARE_PROVIDER_SITE_OTHER): Payer: Self-pay

## 2024-06-28 NOTE — Progress Notes (Signed)
 Specialty Pharmacy Refill Coordination Note  MyChart Questionnaire Submission  Wanda Kane is a 63 y.o. female contacted today regarding refills of specialty medication(s) Descovy  AND Pifeltro .  Doses on hand: (Patient-Rptd) 10   Patient requested: (Patient-Rptd) Delivery   Delivery date: 06/30/24  Verified address: [address] [place] [address]  Medication will be filled on 06/29/24.  UPS only!! Write [address] on label. [facility] prints out on label which caused misdelivery.

## 2024-06-29 ENCOUNTER — Other Ambulatory Visit (HOSPITAL_COMMUNITY): Payer: Self-pay

## 2024-07-01 ENCOUNTER — Ambulatory Visit: Admitting: Internal Medicine

## 2024-07-22 ENCOUNTER — Other Ambulatory Visit (HOSPITAL_COMMUNITY): Payer: Self-pay

## 2024-07-28 ENCOUNTER — Other Ambulatory Visit: Payer: Self-pay | Admitting: Internal Medicine

## 2024-07-28 DIAGNOSIS — Z1231 Encounter for screening mammogram for malignant neoplasm of breast: Secondary | ICD-10-CM

## 2024-07-29 ENCOUNTER — Encounter (INDEPENDENT_AMBULATORY_CARE_PROVIDER_SITE_OTHER): Payer: Self-pay

## 2024-07-29 ENCOUNTER — Other Ambulatory Visit (HOSPITAL_COMMUNITY): Payer: Self-pay

## 2024-07-29 ENCOUNTER — Other Ambulatory Visit: Payer: Self-pay

## 2024-07-29 NOTE — Progress Notes (Signed)
 Specialty Pharmacy Refill Coordination Note  MyChart Questionnaire Submission  Wanda Kane is a 63 y.o. female contacted today regarding refills of specialty medication(s) Descovy  & Pifeltro .  Doses on hand: (Patient-Rptd) 10 days   Patient requested: (Patient-Rptd) Pickup at [facility] date: 08/01/24  Medication will be filled on 07/29/24.

## 2024-08-01 ENCOUNTER — Other Ambulatory Visit (HOSPITAL_COMMUNITY): Payer: Self-pay

## 2024-08-02 ENCOUNTER — Ambulatory Visit: Admitting: Internal Medicine

## 2024-08-17 ENCOUNTER — Other Ambulatory Visit: Payer: Self-pay

## 2024-08-25 ENCOUNTER — Other Ambulatory Visit: Payer: Self-pay

## 2024-08-26 ENCOUNTER — Encounter

## 2024-08-26 DIAGNOSIS — Z1231 Encounter for screening mammogram for malignant neoplasm of breast: Secondary | ICD-10-CM

## 2024-08-27 ENCOUNTER — Encounter (INDEPENDENT_AMBULATORY_CARE_PROVIDER_SITE_OTHER): Payer: Self-pay

## 2024-08-29 ENCOUNTER — Other Ambulatory Visit: Payer: Self-pay | Admitting: Internal Medicine

## 2024-08-29 ENCOUNTER — Other Ambulatory Visit: Payer: Self-pay

## 2024-08-29 ENCOUNTER — Other Ambulatory Visit (HOSPITAL_COMMUNITY): Payer: Self-pay

## 2024-08-29 DIAGNOSIS — Z1231 Encounter for screening mammogram for malignant neoplasm of breast: Secondary | ICD-10-CM

## 2024-08-29 NOTE — Progress Notes (Signed)
 Specialty Pharmacy Refill Coordination Note  Wanda Kane Signer is a 63 y.o. female contacted today regarding refills of specialty medication(s) Descovy  and Pifeltro   Patient requested Pick up  Pickup date: 09/08/24  Medication will be filled on 10/22.       `

## 2024-09-07 ENCOUNTER — Other Ambulatory Visit (HOSPITAL_COMMUNITY): Payer: Self-pay

## 2024-09-08 ENCOUNTER — Encounter

## 2024-09-08 DIAGNOSIS — Z1231 Encounter for screening mammogram for malignant neoplasm of breast: Secondary | ICD-10-CM

## 2024-09-09 ENCOUNTER — Other Ambulatory Visit (HOSPITAL_COMMUNITY): Payer: Self-pay

## 2024-09-23 ENCOUNTER — Ambulatory Visit

## 2024-09-26 ENCOUNTER — Other Ambulatory Visit: Payer: Self-pay | Admitting: Internal Medicine

## 2024-09-26 DIAGNOSIS — Z1231 Encounter for screening mammogram for malignant neoplasm of breast: Secondary | ICD-10-CM

## 2024-09-29 ENCOUNTER — Encounter (INDEPENDENT_AMBULATORY_CARE_PROVIDER_SITE_OTHER): Payer: Self-pay

## 2024-09-29 ENCOUNTER — Other Ambulatory Visit: Payer: Self-pay

## 2024-09-29 ENCOUNTER — Other Ambulatory Visit (HOSPITAL_COMMUNITY): Payer: Self-pay

## 2024-09-29 NOTE — Progress Notes (Signed)
 Specialty Pharmacy Refill Coordination Note  MyChart Questionnaire Submission  Wanda Kane is a 63 y.o. female contacted today regarding refills of specialty medication(s) Descovy  & Pifeltro .  Doses on hand: (Patient-Rptd) 12   Patient requested: (Patient-Rptd) Delivery   Delivery date: 10/03/24  Verified address: [address] [place] [address]  Medication will be filled on 09/30/24.

## 2024-10-04 ENCOUNTER — Ambulatory Visit: Admitting: Internal Medicine

## 2024-10-07 ENCOUNTER — Other Ambulatory Visit: Payer: Self-pay

## 2024-10-07 ENCOUNTER — Other Ambulatory Visit: Payer: Self-pay | Admitting: Internal Medicine

## 2024-10-07 DIAGNOSIS — E1159 Type 2 diabetes mellitus with other circulatory complications: Secondary | ICD-10-CM

## 2024-10-08 DIAGNOSIS — Z792 Long term (current) use of antibiotics: Secondary | ICD-10-CM | POA: Diagnosis not present

## 2024-10-08 DIAGNOSIS — L03011 Cellulitis of right finger: Secondary | ICD-10-CM | POA: Diagnosis not present

## 2024-10-21 ENCOUNTER — Encounter (INDEPENDENT_AMBULATORY_CARE_PROVIDER_SITE_OTHER): Payer: Self-pay

## 2024-10-21 ENCOUNTER — Other Ambulatory Visit: Payer: Self-pay

## 2024-10-21 ENCOUNTER — Ambulatory Visit

## 2024-10-21 DIAGNOSIS — Z1231 Encounter for screening mammogram for malignant neoplasm of breast: Secondary | ICD-10-CM

## 2024-10-24 ENCOUNTER — Other Ambulatory Visit (HOSPITAL_COMMUNITY): Payer: Self-pay

## 2024-10-24 ENCOUNTER — Other Ambulatory Visit: Payer: Self-pay

## 2024-10-24 NOTE — Progress Notes (Signed)
 Specialty Pharmacy Refill Coordination Note  MyChart Questionnaire Submission  Wanda Kane is a 63 y.o. female contacted today regarding refills of specialty medication(s) Descovy  & Pifeltro .  Doses on hand: 10 on 12/5   Patient requested: Delivery  Delivery date: 10/27/24  Verified address: [address] [facility] [place] [facility] [address] ([employer])  Medication will be filled on 10/26/24.

## 2024-10-25 ENCOUNTER — Other Ambulatory Visit: Payer: Self-pay

## 2024-11-02 ENCOUNTER — Other Ambulatory Visit: Payer: Self-pay

## 2024-11-03 ENCOUNTER — Other Ambulatory Visit: Payer: Self-pay

## 2024-11-15 ENCOUNTER — Other Ambulatory Visit: Payer: Self-pay

## 2024-11-15 ENCOUNTER — Ambulatory Visit

## 2024-11-15 NOTE — Progress Notes (Signed)
 Specialty Pharmacy Refill Coordination Note  Wanda Kane is a 63 y.o. female contacted today regarding refills of specialty medication(s) Emtricitabine -Tenofovir  AF (Descovy ); Doravirine  (PIFELTRO )   Patient requested Delivery   Delivery date: 11/21/24   Verified address: [address] [place] [address]   Medication will be filled on: 11/18/24

## 2024-11-18 ENCOUNTER — Other Ambulatory Visit: Payer: Self-pay

## 2024-11-23 ENCOUNTER — Other Ambulatory Visit: Payer: Self-pay

## 2024-11-23 ENCOUNTER — Other Ambulatory Visit: Payer: Self-pay | Admitting: Pharmacist

## 2024-11-23 NOTE — Progress Notes (Signed)
 Specialty Pharmacy Ongoing Clinical Assessment Note  Wanda Kane is a 64 y.o. female who is being followed by the specialty pharmacy service for RxSp HIV   Patient's specialty medication(s) reviewed today: Doravirine  (PIFELTRO ); Emtricitabine -Tenofovir  AF (Descovy )   Missed doses in the last 4 weeks: 1   Patient/Caregiver did not have any additional questions or concerns.   Therapeutic benefit summary: Patient is achieving benefit   Adverse events/side effects summary: No adverse events/side effects   Patient's therapy is appropriate to: Continue    Goals Addressed             This Visit's Progress    Achieve Undetectable HIV Viral Load < 20   On track    Patient is on track. Patient will maintain adherence. Viral load undetectable long term.          Follow up: 12 months  Lyle LELON Chalk Specialty Pharmacist

## 2024-11-25 ENCOUNTER — Other Ambulatory Visit: Payer: Self-pay | Admitting: Internal Medicine

## 2024-11-25 ENCOUNTER — Other Ambulatory Visit (HOSPITAL_COMMUNITY): Payer: Self-pay

## 2024-11-25 DIAGNOSIS — E1159 Type 2 diabetes mellitus with other circulatory complications: Secondary | ICD-10-CM

## 2024-11-25 MED ORDER — SPIRONOLACTONE 100 MG PO TABS
100.0000 mg | ORAL_TABLET | Freq: Every day | ORAL | 0 refills | Status: AC
  Filled 2024-11-25 – 2024-11-28 (×2): qty 30, 30d supply, fill #0

## 2024-11-25 MED ORDER — AMLODIPINE BESYLATE 10 MG PO TABS
10.0000 mg | ORAL_TABLET | Freq: Every day | ORAL | 0 refills | Status: AC
  Filled 2024-11-25 – 2024-11-28 (×2): qty 30, 30d supply, fill #0

## 2024-11-25 MED ORDER — LISINOPRIL 10 MG PO TABS
15.0000 mg | ORAL_TABLET | Freq: Every day | ORAL | 0 refills | Status: AC
  Filled 2024-11-25 – 2024-11-28 (×2): qty 45, 30d supply, fill #0

## 2024-11-28 ENCOUNTER — Other Ambulatory Visit (HOSPITAL_COMMUNITY): Payer: Self-pay

## 2024-11-29 ENCOUNTER — Ambulatory Visit: Admitting: Internal Medicine

## 2024-11-30 ENCOUNTER — Ambulatory Visit

## 2024-11-30 ENCOUNTER — Other Ambulatory Visit: Payer: Self-pay

## 2024-11-30 ENCOUNTER — Other Ambulatory Visit (HOSPITAL_COMMUNITY): Payer: Self-pay

## 2024-11-30 MED ORDER — IBUPROFEN 600 MG PO TABS
600.0000 mg | ORAL_TABLET | Freq: Four times a day (QID) | ORAL | 1 refills | Status: AC | PRN
  Filled 2024-11-30: qty 30, 8d supply, fill #0

## 2024-12-08 ENCOUNTER — Other Ambulatory Visit: Payer: Self-pay

## 2024-12-08 ENCOUNTER — Other Ambulatory Visit: Payer: Self-pay | Admitting: Infectious Disease

## 2024-12-08 ENCOUNTER — Encounter (INDEPENDENT_AMBULATORY_CARE_PROVIDER_SITE_OTHER): Payer: Self-pay

## 2024-12-08 DIAGNOSIS — B20 Human immunodeficiency virus [HIV] disease: Secondary | ICD-10-CM

## 2024-12-08 MED ORDER — DESCOVY 200-25 MG PO TABS
1.0000 | ORAL_TABLET | Freq: Every day | ORAL | 1 refills | Status: AC
  Filled 2024-12-08: qty 30, 30d supply, fill #0

## 2024-12-08 MED ORDER — DORAVIRINE 100 MG PO TABS
100.0000 mg | ORAL_TABLET | Freq: Every day | ORAL | 1 refills | Status: AC
  Filled 2024-12-08: qty 30, 30d supply, fill #0

## 2024-12-09 ENCOUNTER — Other Ambulatory Visit: Payer: Self-pay

## 2024-12-09 NOTE — Progress Notes (Signed)
 Specialty Pharmacy Refill Coordination Note  Wanda Kane is a 64 y.o. female contacted today regarding refills of specialty medication(s) Doravirine  (PIFELTRO ); Emtricitabine -Tenofovir  AF (Descovy )   Patient requested (Patient-Rptd) Delivery   Delivery date: 12/15/24   Verified address: (Patient-Rptd) [address], [address], [place], [place], [address]   Medication will be filled on: 12/14/24

## 2024-12-13 ENCOUNTER — Ambulatory Visit

## 2024-12-14 ENCOUNTER — Other Ambulatory Visit: Payer: Self-pay

## 2024-12-20 ENCOUNTER — Other Ambulatory Visit: Payer: Self-pay

## 2024-12-27 ENCOUNTER — Ambulatory Visit

## 2025-01-26 ENCOUNTER — Other Ambulatory Visit: Payer: Self-pay

## 2025-02-08 ENCOUNTER — Ambulatory Visit: Payer: Self-pay | Admitting: Infectious Disease
